# Patient Record
Sex: Male | Born: 1980
Health system: Southern US, Community
[De-identification: ages and names within clinical notes are randomized; demographics above are authoritative.]

## PROBLEM LIST (undated history)

## (undated) DIAGNOSIS — F4325 Adjustment disorder with mixed disturbance of emotions and conduct: Secondary | ICD-10-CM

## (undated) DIAGNOSIS — F603 Borderline personality disorder: Secondary | ICD-10-CM

## (undated) DIAGNOSIS — F79 Unspecified intellectual disabilities: Secondary | ICD-10-CM

## (undated) DIAGNOSIS — F250 Schizoaffective disorder, bipolar type: Secondary | ICD-10-CM

## (undated) DIAGNOSIS — F259 Schizoaffective disorder, unspecified: Secondary | ICD-10-CM

## (undated) DIAGNOSIS — Z9151 Personal history of suicidal behavior: Secondary | ICD-10-CM

## (undated) DIAGNOSIS — Z915 Personal history of self-harm: Secondary | ICD-10-CM

## (undated) HISTORY — PX: INNER EAR SURGERY: SHX679

## (undated) HISTORY — PX: NASAL SINUS SURGERY: SHX719

---

## 2010-10-19 ENCOUNTER — Emergency Department: Payer: Self-pay | Admitting: Emergency Medicine

## 2010-12-04 ENCOUNTER — Inpatient Hospital Stay: Payer: Self-pay | Admitting: Psychiatry

## 2011-01-14 ENCOUNTER — Inpatient Hospital Stay: Payer: Self-pay | Admitting: Psychiatry

## 2011-02-03 ENCOUNTER — Inpatient Hospital Stay: Payer: Self-pay | Admitting: Psychiatry

## 2011-04-15 ENCOUNTER — Emergency Department: Payer: Self-pay | Admitting: Emergency Medicine

## 2011-09-29 LAB — URINALYSIS, COMPLETE
Bacteria: NONE SEEN
Blood: NEGATIVE
Nitrite: NEGATIVE
Protein: NEGATIVE
RBC,UR: NONE SEEN /HPF (ref 0–5)
Specific Gravity: 1.004 (ref 1.003–1.030)
Squamous Epithelial: NONE SEEN
WBC UR: 1 /HPF (ref 0–5)

## 2011-09-29 LAB — COMPREHENSIVE METABOLIC PANEL
Alkaline Phosphatase: 91 U/L (ref 50–136)
Anion Gap: 12 (ref 7–16)
BUN: 4 mg/dL — ABNORMAL LOW (ref 7–18)
Bilirubin,Total: 0.4 mg/dL (ref 0.2–1.0)
Calcium, Total: 9.5 mg/dL (ref 8.5–10.1)
Chloride: 105 mmol/L (ref 98–107)
Co2: 21 mmol/L (ref 21–32)
Creatinine: 0.86 mg/dL (ref 0.60–1.30)
EGFR (Non-African Amer.): >60
Osmolality: 272 (ref 275–301)
Potassium: 3.5 mmol/L (ref 3.5–5.1)
SGOT(AST): 17 U/L (ref 15–37)
SGPT (ALT): 14 U/L
Sodium: 138 mmol/L (ref 136–145)

## 2011-09-29 LAB — TSH: Thyroid Stimulating Horm: 2.13 u[IU]/mL

## 2011-09-29 LAB — DRUG SCREEN, URINE
Barbiturates, Ur Screen: NEGATIVE (ref ?–200)
Cocaine Metabolite,Ur ~~LOC~~: NEGATIVE (ref ?–300)
Methadone, Ur Screen: NEGATIVE (ref ?–300)
Opiate, Ur Screen: NEGATIVE (ref ?–300)
Phencyclidine (PCP) Ur S: NEGATIVE (ref ?–25)

## 2011-09-29 LAB — ETHANOL
Ethanol %: <0.003 % (ref 0.000–0.080)
Ethanol: <3 mg/dL

## 2011-09-29 LAB — CBC
HGB: 15 g/dL (ref 13.0–18.0)
MCH: 33.4 pg (ref 26.0–34.0)
MCHC: 34 g/dL (ref 32.0–36.0)
RBC: 4.5 10*6/uL (ref 4.40–5.90)
RDW: 12.5 % (ref 11.5–14.5)
WBC: 8.4 10*3/uL (ref 3.8–10.6)

## 2011-09-29 LAB — VALPROIC ACID LEVEL: Valproic Acid: 51 ug/mL

## 2011-09-30 ENCOUNTER — Inpatient Hospital Stay: Payer: Self-pay | Admitting: Psychiatry

## 2011-10-01 LAB — LIPID PANEL
HDL Cholesterol: 38 mg/dL — ABNORMAL LOW (ref 40–60)
Ldl Cholesterol, Calc: 60 mg/dL (ref 0–100)
Triglycerides: 108 mg/dL (ref 0–200)

## 2011-10-01 LAB — FOLATE: Folic Acid: 16.1 ng/mL (ref 3.1–100.0)

## 2011-10-03 LAB — VALPROIC ACID LEVEL: Valproic Acid: 102 ug/mL — ABNORMAL HIGH

## 2011-10-04 LAB — VALPROIC ACID LEVEL: Valproic Acid: 73 ug/mL

## 2011-10-07 LAB — COMPREHENSIVE METABOLIC PANEL
Anion Gap: 10 (ref 7–16)
BUN: 8 mg/dL (ref 7–18)
Bilirubin,Total: 0.8 mg/dL (ref 0.2–1.0)
Calcium, Total: 9 mg/dL (ref 8.5–10.1)
Chloride: 95 mmol/L — ABNORMAL LOW (ref 98–107)
Co2: 24 mmol/L (ref 21–32)
Creatinine: 0.78 mg/dL (ref 0.60–1.30)
EGFR (African American): >60
EGFR (Non-African Amer.): >60
Glucose: 73 mg/dL (ref 65–99)
Osmolality: 256 (ref 275–301)
Potassium: 3.6 mmol/L (ref 3.5–5.1)
SGOT(AST): 17 U/L (ref 15–37)
Sodium: 129 mmol/L — ABNORMAL LOW (ref 136–145)
Total Protein: 7.1 g/dL (ref 6.4–8.2)

## 2011-10-07 LAB — URINALYSIS, COMPLETE
Blood: NEGATIVE
Glucose,UR: NEGATIVE mg/dL (ref 0–75)
Nitrite: NEGATIVE
Ph: 5 (ref 4.5–8.0)
Protein: 30

## 2011-10-07 LAB — ACETAMINOPHEN LEVEL: Acetaminophen: <2 ug/mL

## 2011-10-07 LAB — DRUG SCREEN, URINE
Benzodiazepine, Ur Scrn: NEGATIVE (ref ?–200)
Cannabinoid 50 Ng, Ur ~~LOC~~: NEGATIVE (ref ?–50)
Cocaine Metabolite,Ur ~~LOC~~: NEGATIVE (ref ?–300)
MDMA (Ecstasy)Ur Screen: NEGATIVE (ref ?–500)
Methadone, Ur Screen: NEGATIVE (ref ?–300)
Opiate, Ur Screen: NEGATIVE (ref ?–300)
Phencyclidine (PCP) Ur S: NEGATIVE (ref ?–25)
Tricyclic, Ur Screen: NEGATIVE (ref ?–1000)

## 2011-10-07 LAB — CBC
HCT: 41 % (ref 40.0–52.0)
HGB: 13.9 g/dL (ref 13.0–18.0)
MCHC: 33.8 g/dL (ref 32.0–36.0)
MCV: 98 fL (ref 80–100)
Platelet: 196 10*3/uL (ref 150–440)
RDW: 12.7 % (ref 11.5–14.5)

## 2011-10-07 LAB — SALICYLATE LEVEL: Salicylates, Serum: <1.7 mg/dL

## 2011-10-08 ENCOUNTER — Inpatient Hospital Stay: Payer: Self-pay | Admitting: Psychiatry

## 2011-10-11 LAB — BASIC METABOLIC PANEL
Anion Gap: 9 (ref 7–16)
BUN: 11 mg/dL (ref 7–18)
Chloride: 104 mmol/L (ref 98–107)
Co2: 27 mmol/L (ref 21–32)
Creatinine: 0.72 mg/dL (ref 0.60–1.30)
Glucose: 92 mg/dL (ref 65–99)
Osmolality: 278 (ref 275–301)
Sodium: 140 mmol/L (ref 136–145)

## 2011-10-21 LAB — VALPROIC ACID LEVEL: Valproic Acid: 59 ug/mL

## 2013-10-12 ENCOUNTER — Emergency Department (HOSPITAL_COMMUNITY)
Admission: EM | Admit: 2013-10-12 | Discharge: 2013-10-15 | Disposition: A | Discharge status: Home / Self Care | Payer: Medicaid Other | Attending: Emergency Medicine | Admitting: Emergency Medicine

## 2013-10-12 ENCOUNTER — Encounter (HOSPITAL_COMMUNITY): Payer: Self-pay | Admitting: Emergency Medicine

## 2013-10-12 DIAGNOSIS — F4321 Adjustment disorder with depressed mood: Secondary | ICD-10-CM

## 2013-10-12 DIAGNOSIS — Z79899 Other long term (current) drug therapy: Secondary | ICD-10-CM | POA: Insufficient documentation

## 2013-10-12 DIAGNOSIS — Z880 Allergy status to penicillin: Secondary | ICD-10-CM | POA: Insufficient documentation

## 2013-10-12 DIAGNOSIS — F17200 Nicotine dependence, unspecified, uncomplicated: Secondary | ICD-10-CM | POA: Insufficient documentation

## 2013-10-12 DIAGNOSIS — F259 Schizoaffective disorder, unspecified: Secondary | ICD-10-CM | POA: Diagnosis present

## 2013-10-12 DIAGNOSIS — R45851 Suicidal ideations: Secondary | ICD-10-CM | POA: Insufficient documentation

## 2013-10-12 HISTORY — DX: Schizoaffective disorder, unspecified: F25.9

## 2013-10-12 LAB — CBC
HEMATOCRIT: 39.1 % (ref 39.0–52.0)
HEMOGLOBIN: 14.2 g/dL (ref 13.0–17.0)
MCH: 34.5 pg — AB (ref 26.0–34.0)
MCHC: 36.3 g/dL — ABNORMAL HIGH (ref 30.0–36.0)
MCV: 94.9 fL (ref 78.0–100.0)
Platelets: 159 10*3/uL (ref 150–400)
RBC: 4.12 MIL/uL — AB (ref 4.22–5.81)
RDW: 13 % (ref 11.5–15.5)
WBC: 6.2 10*3/uL (ref 4.0–10.5)

## 2013-10-12 LAB — COMPREHENSIVE METABOLIC PANEL
ALT: 28 U/L (ref 0–53)
AST: 32 U/L (ref 0–37)
Albumin: 4 g/dL (ref 3.5–5.2)
Alkaline Phosphatase: 66 U/L (ref 39–117)
BUN: 5 mg/dL — AB (ref 6–23)
CALCIUM: 9.2 mg/dL (ref 8.4–10.5)
CO2: 16 meq/L — AB (ref 19–32)
CREATININE: 0.74 mg/dL (ref 0.50–1.35)
Chloride: 89 mEq/L — ABNORMAL LOW (ref 96–112)
GLUCOSE: 83 mg/dL (ref 70–99)
Potassium: 3.6 mEq/L — ABNORMAL LOW (ref 3.7–5.3)
Sodium: 122 mEq/L — ABNORMAL LOW (ref 137–147)
Total Bilirubin: 0.8 mg/dL (ref 0.3–1.2)
Total Protein: 6.8 g/dL (ref 6.0–8.3)

## 2013-10-12 LAB — RAPID URINE DRUG SCREEN, HOSP PERFORMED
Amphetamines: NOT DETECTED
Barbiturates: NOT DETECTED
Benzodiazepines: NOT DETECTED
Cocaine: NOT DETECTED
Opiates: NOT DETECTED
Tetrahydrocannabinol: NOT DETECTED

## 2013-10-12 LAB — ETHANOL: Alcohol, Ethyl (B): <11 mg/dL (ref 0–11)

## 2013-10-12 LAB — SALICYLATE LEVEL

## 2013-10-12 LAB — ACETAMINOPHEN LEVEL: Acetaminophen (Tylenol), Serum: <15 ug/mL (ref 10–30)

## 2013-10-12 MED ORDER — POTASSIUM CHLORIDE CRYS ER 20 MEQ PO TBCR
40.0000 meq | EXTENDED_RELEASE_TABLET | Freq: Once | ORAL | Status: AC
  Administered 2013-10-12: 40 meq via ORAL
  Filled 2013-10-12: qty 2

## 2013-10-12 MED ORDER — SODIUM CHLORIDE 0.9 % IV BOLUS (SEPSIS)
1000.0000 mL | Freq: Once | INTRAVENOUS | Status: AC
  Administered 2013-10-12: 1000 mL via INTRAVENOUS

## 2013-10-12 NOTE — ED Notes (Signed)
Pt states for the past few days he has been thinking of killing himself  [facility] in room with pt  Pt states he has had a lot going on lately and he has not been taking his medication for the past 4 days  Pt states he wants to drink poison  ACT in room with pt states his medicaid has run out and they cannot find the funding for his medication and in the meantime the pt has decompensated

## 2013-10-12 NOTE — ED Provider Notes (Signed)
CSN: [id number]     Arrival date & time 10/12/13  1946 History  This chart was scribed for Antonietta Breach, [place] working with Dot Lanes, MD by Roxan Diesel, ED Scribe. This patient was seen in room WTR3/WLPT3 and the patient's care was started at 9:25 PM.   Chief Complaint  Patient presents with  . suicidal ideation     The history is provided by the patient. No language interpreter was used.   HPI Comments: Ryan Zuniga is a 33 y.o. male who presents to the Emergency Department complaining of suicidal ideation.  Pt states he has been thinking about killing himself for the past few days because he has had too much "going on, in the group home."  He reports he has been considering drinking poison.  He denies ingesting anything prior to arrival.  However group home staff told [facility] counsellor that pt has not eaten in 2 days.  Pt confirms this and also states he has not been drinking.  He states he is frustrated due to "the way they treat me, non-stop, I can't take it anymore."  He admits to prior h/o suicide attempt in February.  He denies thoughts of hurting anyone else.  He denies recent alcohol or illicit drug use.  He denies auditory or visual hallucinations.  Pt has not taken his medications in 4 days because his Medicaid has lapsed due to switching counties of residence.  The pharmacy initially dropped off his medications at the group home but came back and retrieved them after discovering this lapse.  His group home has been trying to put together money to buy his medications but they are expensive and they have not yet gathered enough funds.   Past Medical History  Diagnosis Date  . Schizoaffective disorder     Past Surgical History  Procedure Laterality Date  . Nasal sinus surgery    . Inner ear surgery      Family History  Problem Relation Age of Onset  . Family history unknown: Yes    History  Substance Use Topics  . Smoking status: Current Every Day  Smoker  . Smokeless tobacco: Not on file  . Alcohol Use: No     Comment: former    Review of Systems  Psychiatric/Behavioral: Positive for suicidal ideas. Negative for hallucinations and self-injury.  All other systems reviewed and are negative.    Allergies  Aspirin; Penicillins; and Codeine  Home Medications   Prior to Admission medications   Medication Sig Start Date End Date Taking? Authorizing Provider  benztropine (COGENTIN) 1 MG tablet Take 1 mg by mouth 2 (two) times daily.   Yes Historical Provider, MD  divalproex (DEPAKOTE) 500 MG DR tablet Take 500 mg by mouth 3 (three) times daily.   Yes Historical Provider, MD  hydrOXYzine (ATARAX/VISTARIL) 25 MG tablet Take 50 mg by mouth 3 (three) times daily as needed (anxiety). Take 2 tablets   Yes Historical Provider, MD  mirtazapine (REMERON) 30 MG tablet Take 30 mg by mouth at bedtime.   Yes Historical Provider, MD  QUEtiapine Fumarate (SEROQUEL XR) 150 MG 24 hr tablet Take 150 mg by mouth at bedtime.   Yes Historical Provider, MD  risperidone (RISPERDAL) 4 MG tablet Take 2-4 mg by mouth 2 (two) times daily. Take 2 mg in the am and 1 in the evening   Yes Historical Provider, MD   BP 118/71  Pulse 73  Temp(Src) 98.2 F (36.8 C) (Oral)  Resp 18  Wt 170 lb (77.111 kg)  SpO2 98%  Physical Exam  Nursing note and vitals reviewed. Constitutional: He is oriented to person, place, and time. He appears well-developed and well-nourished. No distress.  HENT:  Head: Normocephalic and atraumatic.  Eyes: Conjunctivae and EOM are normal. No scleral icterus.  Neck: Normal range of motion.  Cardiovascular: Normal rate, regular rhythm and normal heart sounds.   Pulmonary/Chest: Effort normal and breath sounds normal. No respiratory distress. He has no wheezes. He has no rales.  Abdominal: Soft. He exhibits no distension. There is no tenderness. There is no rebound.  Musculoskeletal: Normal range of motion.  Neurological: He is alert and  oriented to person, place, and time.  Skin: Skin is warm and dry. No rash noted. He is not diaphoretic. No erythema. No pallor.  Psychiatric: He has a normal mood and affect. His behavior is normal. He expresses suicidal ideation. He expresses no homicidal ideation. He expresses suicidal plans. He expresses no homicidal plans.    ED Course  Procedures (including critical care time)  DIAGNOSTIC STUDIES: Oxygen Saturation is 98% on room air, normal by my interpretation.    COORDINATION OF CARE: 9:31 PM-Discussed treatment plan which includes IV fluids and behavioral health evaluation with pt at bedside and pt agreed to plan.   Labs Review Labs Reviewed  CBC - Abnormal; Notable for the following:    RBC 4.12 (*)    MCH 34.5 (*)    MCHC 36.3 (*)    All other components within normal limits  COMPREHENSIVE METABOLIC PANEL - Abnormal; Notable for the following:    Sodium 122 (*)    Potassium 3.6 (*)    Chloride 89 (*)    CO2 16 (*)    BUN 5 (*)    All other components within normal limits  SALICYLATE LEVEL - Abnormal; Notable for the following:    Salicylate Lvl <8.4 (*)    All other components within normal limits  VALPROIC ACID LEVEL - Abnormal; Notable for the following:    Valproic Acid Lvl 133.1 (*)    All other components within normal limits  I-STAT CHEM 8, ED - Abnormal; Notable for the following:    Sodium 131 (*)    Potassium 3.6 (*)    BUN <3 (*)    Glucose, Bld 115 (*)    All other components within normal limits  ACETAMINOPHEN LEVEL  ETHANOL  URINE RAPID DRUG SCREEN (HOSP PERFORMED)  I-STAT CHEM 8, ED   Imaging Review No results found.   EKG Interpretation None      MDM   Final diagnoses:  Suicidal ideations    33 year old male with a history of schizoaffective disorder and past suicide attempts presents today for suicidal ideation. Patient states he has a plan to drink poison to kill himself. Patient denies homicidal ideations, alcohol use, illicit  drug use. Patient evaluated by TTS who has seemed patient appropriate for inpatient treatment. Patient awaiting placement. Care to be assumed by Dalia Heading, PA-C at shift change who will f/u on disposition.   Filed Vitals:   10/12/13 2001 10/13/13 0302 10/13/13 0604  BP: 118/71 113/69 107/61  Pulse: 73 61 61  Temp: 98.2 F (36.8 C) 97.5 F (36.4 C) 98.3 F (36.8 C)  TempSrc: Oral Oral Oral  Resp: 18 16 16   Weight: 170 lb (77.111 kg)    SpO2: 98% 95% 99%     Antonietta Breach, PA-C 10/13/13 [phone]

## 2013-10-13 ENCOUNTER — Encounter (HOSPITAL_COMMUNITY): Payer: Self-pay | Admitting: Psychiatry

## 2013-10-13 DIAGNOSIS — F4321 Adjustment disorder with depressed mood: Secondary | ICD-10-CM | POA: Diagnosis present

## 2013-10-13 LAB — I-STAT CHEM 8, ED
BUN: 4 mg/dL — ABNORMAL LOW (ref 6–23)
CHLORIDE: 102 meq/L (ref 96–112)
CREATININE: 0.7 mg/dL (ref 0.50–1.35)
Calcium, Ion: 1.21 mmol/L (ref 1.12–1.23)
Calcium, Ion: 1.25 mmol/L — ABNORMAL HIGH (ref 1.12–1.23)
Chloride: 96 mEq/L (ref 96–112)
Creatinine, Ser: 0.7 mg/dL (ref 0.50–1.35)
GLUCOSE: 78 mg/dL (ref 70–99)
Glucose, Bld: 115 mg/dL — ABNORMAL HIGH (ref 70–99)
HCT: 43 % (ref 39.0–52.0)
HEMATOCRIT: 45 % (ref 39.0–52.0)
HEMOGLOBIN: 14.6 g/dL (ref 13.0–17.0)
Hemoglobin: 15.3 g/dL (ref 13.0–17.0)
POTASSIUM: 3.6 meq/L — AB (ref 3.7–5.3)
POTASSIUM: 4 meq/L (ref 3.7–5.3)
SODIUM: 131 meq/L — AB (ref 137–147)
SODIUM: 136 meq/L — AB (ref 137–147)
TCO2: 18 mmol/L (ref 0–100)
TCO2: 18 mmol/L (ref 0–100)

## 2013-10-13 LAB — VALPROIC ACID LEVEL: VALPROIC ACID LVL: 133.1 ug/mL — AB (ref 50.0–100.0)

## 2013-10-13 NOTE — ED Notes (Signed)
He quietly eats his breakfast without difficulty and is in no distress.  He has no requests at this time.  Sitter remains at bedside con't.

## 2013-10-13 NOTE — ED Notes (Signed)
I have just given report to Ryan Zuniga in psych. E.D. And he is transferred there at this time without incident.  He ambulates without difficulty.

## 2013-10-13 NOTE — BH Assessment (Signed)
Left voicemail for guardian [name] [phone] requesting collateral information regarding current concerns, dx, and hx of SI.   Lear Ng, [facility] Triage Specialist 10/13/2013 1:54 AM
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
## 2013-10-13 NOTE — ED Notes (Addendum)
Guardian [name] left business card will cell phone number on it and would like to called with updates.   Phone Number is: [phone] Business: [phone]

## 2013-10-13 NOTE — BH Assessment (Signed)
Contacted Ryan Zuniga from [facility] [phone] to obtain collateral information. Asked about current concerns, dx hx, and about any developmental disabilities. Ryan Zuniga reports Ryan Zuniga has not been eating the past 2 1/2 days and does not want to be in the group home. She reports Pt does not like her smoking rules, and reports he does not have to listen to her. Ryan Zuniga reports she [name] gets upset about his speech impediment but was unable to provide this Probation officer with Dx. Ryan Zuniga confirmed Pt has been without medication for the past 4 days due to medicaid lapse.   Spoke with Ryan Colonel, NP about results of the TA. She will check his chart to see if he arrived to ED with any additional information that will assist in placement.   Lear Ng, [facility] Triage Specialist 10/13/2013 2:04 AM

## 2013-10-13 NOTE — BH Assessment (Signed)
Serena Colonel, NP reviewed Pt's chart no MAR. [facility] brought Pt but not assessment was left. Guardian [name] was to come to [facility] to provide more information. This Probation officer attempted to call [name], guardian and left voicemail requesting a call back with information so the Pt could be best served.   Lear Ng, [facility] Triage Specialist 10/13/2013 2:15 AM

## 2013-10-13 NOTE — Progress Notes (Signed)
Spoke with Statistician, to question if IQ paperwork was brought in today along with the guardianship papers and they were not. IQ Paperwork is needed for Disposition placement.  [place] Disposition Tech

## 2013-10-13 NOTE — BH Assessment (Signed)
Ryan Zuniga from adult protective will contact group home to attempt to get needed information. She reports [name] guardian case worker was with Pt until 50 or 11 last night.   Lear Ng, [facility] Triage Specialist 10/13/2013 6:58 AM

## 2013-10-13 NOTE — Progress Notes (Signed)
B.Eliav Mechling, MHT received report from [name], DDS Guardian that she would be bringing copy of guardianship papers and IQ score information to [facility]. Ms. Ishmael Holter reports patient does have diagnosis of MR, Schizoaffective Disorder, and Personality Disorder.

## 2013-10-13 NOTE — BH Assessment (Signed)
Called group home to see if this writer could get information regarding ACT team contact information. Ryan Zuniga reports Pt does not have an ACT team provider as he is idyl, but that he was seen today by the supervisor Colletta Maryland. [phone].  Called the number for ACT provided by [facility]. Message reports unable to leave a voicemail due to the voicemail not yet being set up.   Lear Ng, [facility] Triage Specialist 10/13/2013 4:05 AM

## 2013-10-13 NOTE — BH Assessment (Signed)
Called DSS after hours number. [phone]) Mikki Santee with the after hours service will contact Ross Marcus, the on-call DSS worker to call this writer back.   Lear Ng, [facility] Triage Specialist 10/13/2013 5:08 AM

## 2013-10-13 NOTE — BH Assessment (Signed)
Spoke with Ross Marcus from [place]. She will attempt to gather necessary information regarding IDD, dx, and prior tx hx, and then call TTS back.  Lear Ng, [facility] Triage Specialist 10/13/2013 5:18 AM

## 2013-10-13 NOTE — Progress Notes (Signed)
Patients' group home contacted on this day April Ellison Assistant Administrator [phone] she reports patient came to group home 4 months ago from [facility]. She states that patient does not want to be at group home, he is out of all of his prescribed medications due to problems with Medicaid; Zyprexa 500 mg, Risperidol 4 mg, Seroquel XR 150mg , Topomax 100mg , Benapine 1 mg, Mirtazpine 30 mg, Hydroxpine HCL 25 mg. Ms. Loanne Drilling reports that she believes that patient has IDD but not sure and reports that he is diagnosed with Schizophrenia and Anxiety. Patients ACT provider is Programme researcher, [employer]/film[employer] [phone] and [place] [phone]. Attempted to contact both providers no response

## 2013-10-13 NOTE — BH Assessment (Signed)
Adult protective services worker Junius Finner contacted this Probation officer at the request of DSS worker Ross Marcus. Tia reports she does not have access to Pt's information but she will contact her supervisor to attempt to get the information. Tia's contact information is [phone].   Lear Ng, [facility] Triage Specialist 10/13/2013 6:34 AM

## 2013-10-13 NOTE — ED Notes (Signed)
TTS at bedside.Unable to obtain labs at this time.

## 2013-10-13 NOTE — BH Assessment (Addendum)
Called [facility] to attempt to get information about Pt being brought to ED and possible contact information for ACT team members. Danae Chen Pharmacologist for [facility] reports no notes for [facility] for today. He was last seen in 2014 for schizophrenia.   [place] to attempt to access ACT team contact information and dx hx. Left a voicemail, and it was indicated call would be returned within 30 minutes.  Mark from [employer] reports no record of Pt.  Lear Ng, [facility] Triage Specialist 10/13/2013 3:08 AM
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
## 2013-10-13 NOTE — BH Assessment (Addendum)
Attempted to call guardian [name] again with no answer. Attempted to call ACT team with no voicemail available.  Called [name] again at [phone] 10-13-2013 and left a voicemail requesting a call back for information.   Lear Ng, [facility] Triage Specialist 10/13/2013 5:04 AM
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
## 2013-10-13 NOTE — ED Notes (Signed)
He is sleeping soundly in the presence of his sitter.  His skin is normal, warm and ry and he is breathing normally.

## 2013-10-14 DIAGNOSIS — F3130 Bipolar disorder, current episode depressed, mild or moderate severity, unspecified: Secondary | ICD-10-CM

## 2013-10-14 LAB — VALPROIC ACID LEVEL: Valproic Acid Lvl: 49.3 ug/mL — ABNORMAL LOW (ref 50.0–100.0)

## 2013-10-14 MED ORDER — QUETIAPINE FUMARATE ER 50 MG PO TB24
150.0000 mg | ORAL_TABLET | Freq: Every day | ORAL | Status: DC
  Administered 2013-10-14: 150 mg via ORAL
  Filled 2013-10-14 (×2): qty 3

## 2013-10-14 MED ORDER — MIRTAZAPINE 30 MG PO TABS
30.0000 mg | ORAL_TABLET | Freq: Every day | ORAL | Status: DC
  Administered 2013-10-14: 30 mg via ORAL
  Filled 2013-10-14: qty 1

## 2013-10-14 MED ORDER — RISPERIDONE 2 MG PO TABS: 2.0000 mg | ORAL_TABLET | Freq: Two times a day (BID) | ORAL | Status: DC

## 2013-10-14 MED ORDER — RISPERIDONE 2 MG PO TABS
2.0000 mg | ORAL_TABLET | Freq: Two times a day (BID) | ORAL | Status: DC
  Administered 2013-10-14 – 2013-10-15 (×3): 2 mg via ORAL
  Filled 2013-10-14 (×3): qty 1

## 2013-10-14 MED ORDER — HYDROXYZINE HCL 25 MG PO TABS: 50.0000 mg | ORAL_TABLET | Freq: Three times a day (TID) | ORAL | Status: DC | PRN

## 2013-10-14 MED ORDER — DIVALPROEX SODIUM 500 MG PO DR TAB
500.0000 mg | DELAYED_RELEASE_TABLET | Freq: Three times a day (TID) | ORAL | Status: DC
  Administered 2013-10-14 – 2013-10-15 (×4): 500 mg via ORAL
  Filled 2013-10-14 (×4): qty 1

## 2013-10-14 MED ORDER — BENZTROPINE MESYLATE 1 MG PO TABS
1.0000 mg | ORAL_TABLET | Freq: Two times a day (BID) | ORAL | Status: DC
  Administered 2013-10-14 – 2013-10-15 (×3): 1 mg via ORAL
  Filled 2013-10-14 (×3): qty 1

## 2013-10-14 MED ORDER — QUETIAPINE FUMARATE ER 50 MG PO TB24: 150.0000 mg | ORAL_TABLET | Freq: Every day | ORAL | Status: DC

## 2013-10-14 NOTE — BH Assessment (Signed)
Per Clayborne Dana, [facility] at [facility], adult unit is currently at capacity. Contacted the following facilities for placement:   AT CAPACITY:  [place], per [facility], per Wandra Feinstein, Per [facility], Per [facility], per [facility], Per [facility], per [facility], Per [facility], per [facility], per [facility] Shell, per [facility], per Tori Milks, per [facility], per [facility], per [facility], per [place], [facility], [facility] Triage Specialist [phone]
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
## 2013-10-14 NOTE — Progress Notes (Addendum)
CSW placed call to [facility] to see if they had copy in archives of documentation of IDD. Per [place], no documentation of IDD. Per Joycelyn Schmid at [place], no reason to indicate pt has IDD of IQ below 53. Margaret sent over documentation that references low-average IQ. 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  CSW spoke with clinician Davy Pique at [place]. Davy Pique states there is no record of IDD services for pt in [place] system. There is a record of mental health services for patient--in 2014, pt was referred for a Mental Health care coordinator, but never got one.   Rochele Pages,     ED CSW  phone: [phone] 10:20am ______  CSW left message for pt's county guardian, one at 11am, and one at 1:24pm.  Rochele Pages,     ED CSW  phone: [phone] 1:24pm

## 2013-10-14 NOTE — ED Provider Notes (Signed)
Medical screening examination/treatment/procedure(s) were performed by non-physician practitioner and as supervising physician I was immediately available for consultation/collaboration.   Dot Lanes, MD 10/14/13 [phone]

## 2013-10-15 ENCOUNTER — Encounter (HOSPITAL_COMMUNITY): Payer: Self-pay | Admitting: Psychiatry

## 2013-10-15 DIAGNOSIS — R45851 Suicidal ideations: Secondary | ICD-10-CM

## 2013-10-15 DIAGNOSIS — F4321 Adjustment disorder with depressed mood: Secondary | ICD-10-CM

## 2013-10-15 DIAGNOSIS — F259 Schizoaffective disorder, unspecified: Secondary | ICD-10-CM

## 2013-10-15 MED ORDER — BENZTROPINE MESYLATE 1 MG PO TABS: 1.0000 mg | ORAL_TABLET | Freq: Two times a day (BID) | ORAL | Status: DC

## 2013-10-15 MED ORDER — DIVALPROEX SODIUM 500 MG PO DR TAB: 500.0000 mg | DELAYED_RELEASE_TABLET | Freq: Three times a day (TID) | ORAL | Status: DC

## 2013-10-15 MED ORDER — MIRTAZAPINE 30 MG PO TABS: 30.0000 mg | ORAL_TABLET | Freq: Every day | ORAL | Status: DC

## 2013-10-15 MED ORDER — QUETIAPINE FUMARATE ER 150 MG PO TB24: 150.0000 mg | ORAL_TABLET | Freq: Every day | ORAL | Status: DC

## 2013-10-15 MED ORDER — RISPERIDONE 4 MG PO TABS: 2.0000 mg | ORAL_TABLET | Freq: Two times a day (BID) | ORAL | Status: DC

## 2013-10-15 MED ORDER — RISPERIDONE 2 MG PO TABS
2.0000 mg | ORAL_TABLET | Freq: Every day | ORAL | Status: DC
  Filled 2013-10-15 (×2): qty 1

## 2013-10-15 NOTE — Progress Notes (Signed)
CSW reiceved guardianship papers, and placed a copy in patient chart.   CSW spoke with patient guardian [name] at [phone] who shared that she is currently looking for a new group home or placement for patient. Patient guardian feels that patient current group home is not appropriate for patient and patient will continue to have mental health needs if he returns. CSW and Pt guardian discussed patient current disposition from the weekend. Patient guardian suggested [facility] in [place] [phone]). CSW spoke with Hassan Rowan at [facility] who shared that this was an Assisted Living, however she did have openings. CSW to fax pt clinical information to [phone]. Pt guardian also suggested [facility] in [place] however [place] still awaiting to receive contact information from pt guardian.  CSW unable to find contact information online.   Noreene Larsson [phone]  ED CSW 10/15/2013 924am

## 2013-10-15 NOTE — Progress Notes (Signed)
  CARE MANAGEMENT ED NOTE 10/15/2013  Patient:  Ryan Zuniga, Ryan Zuniga   Account Number:  [id number]  Date Initiated:  10/15/2013  Documentation initiated by:  Jackelyn Poling  Subjective/Objective Assessment:   33 yr old self pay [place] pt previously with medicaid that ws not transferered from county to county when he brought to new group home Medicaid to be active October 17 2013 Pt meds have been covered at self pay cost     Subjective/Objective Assessment Detail:   dx adjustment disorder with depressed mood and bipolar depressed  PMH schizoaffective disorder bipolar disorder Guardian DSS Healther Ishmael Holter, IQ <70  [place] able to assist pt with medications to last until pt medicaid is active on or after October 17 2013     Action/Plan:   ED CM reviewed chart and noted needed medications Discussed in ED [facility] unit progeression Will assist with discount coupons and lists of discounted pharmacies plus contact information for [facility] DSS.   Action/Plan Detail:   CM provided coupons for Cogentin, Depakote, Remeron, Seroquel, Risperdal Left in pt d/c package and [place] ED RN updated   Anticipated DC Date:  10/15/2013     Status Recommendation to Physician:   Result of Recommendation:    Other ED Services  Consult [facility] [place]  Other  Outpatient Services - Pt will follow up    Choice offered to / List presented to:            Status of service:  Completed, signed off  ED Comments:   ED Comments Detail:

## 2013-10-15 NOTE — [facility] Suicide Risk Assessment (Signed)
Suicide Risk Assessment  Discharge Assessment     Demographic Factors:  Male, Caucasian and Unemployed  Total Time spent with patient: 20 minutes  Psychiatric Specialty Exam:     Blood pressure 105/66, pulse 54, temperature 98.6 F (37 C), temperature source Oral, resp. rate 16, weight 170 lb (77.111 kg), SpO2 99.00%.There is no height on file to calculate BMI.  General Appearance: Casual  Eye Contact::  Good  Speech:  Normal Rate  Volume:  Normal  Mood:  Euthymic  Affect:  Congruent  Thought Process:  Coherent  Orientation:  Full (Time, Place, and Person)  Thought Content:  WDL  Suicidal Thoughts:  No  Homicidal Thoughts:  No  Memory:  Immediate;   Good Recent;   Good Remote;   Good  Judgement:  Good  Insight:  Lacking  Psychomotor Activity:  Normal  Concentration:  Good  Recall:  Good  Fund of Knowledge:Fair  Language: Good  Akathisia:  No  Handed:  Right  AIMS (if indicated):     Assets:  Financial Resources/Insurance Housing Leisure Time Physical Health Resilience  Sleep:       Musculoskeletal: Strength & Muscle Tone: within normal limits Gait & Station: normal Patient leans: Right   Mental Status Per Nursing Assessment::   On Admission:   Depression  Current Mental Status by Physician: NA  Loss Factors: NA  Historical Factors: NA  Risk Reduction Factors:   Living with another person, especially a relative, Positive social support and Positive therapeutic relationship  Continued Clinical Symptoms:  None  Cognitive Features That Contribute To Risk:  None  Suicide Risk:  Minimal: No identifiable suicidal ideation.  Patients presenting with no risk factors but with morbid ruminations; may be classified as minimal risk based on the severity of the depressive symptoms  Discharge Diagnoses:   AXIS I:  Schizoaffective Disorder AXIS II:  Deferred AXIS III:   Past Medical History  Diagnosis Date  . Schizoaffective disorder    AXIS IV:   other psychosocial or environmental problems and problems with primary support group AXIS V:  61-70 mild symptoms  Plan Of Care/Follow-up recommendations:  Activity:  as tolerated Diet:  low-sodium heart healthy diet  Is patient on multiple antipsychotic therapies at discharge:  No   Has Patient had three or more failed trials of antipsychotic monotherapy by history:  No  Recommended Plan for Multiple Antipsychotic Therapies: NA    LORD, JAMISON, PMH-NP 10/15/2013, 12:35 PM

## 2013-10-15 NOTE — Progress Notes (Addendum)
Per discussion with psychiatrist and NP, patient psychiatrically stable for dc back to group home. CSW spoke with [name] who shares that the biggest ncern is patient medicaid not being valid until 10/17/2013 and the group home not having the medications. Patient to be provided 3 day supply by [facility]. [name] provided group home contact information for [facility], group home owner [phone] and on the group home number [phone]. CSW left message with group home owner on the cell. CSW spoke with group home worker Jiles Garter who stated that owner will have to arrange transportation and unavailable at this time.  CSW informed patient guardian [name] to assist with pt transportation needs. Pt guardian to pick up pt at [place], [place]  ED CSW 10/15/2013 1251pm
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
## 2013-10-15 NOTE — Progress Notes (Signed)
CSW faxed referral to [facility] and [facility] group homes per guardian [name] request.  Tilden Fossa, MSW, [place] Worker [employer] [phone]

## 2013-11-03 ENCOUNTER — Emergency Department: Payer: Self-pay | Admitting: Emergency Medicine

## 2013-11-03 LAB — URINALYSIS, COMPLETE
BACTERIA: NONE SEEN
BILIRUBIN, UR: NEGATIVE
Blood: NEGATIVE
GLUCOSE, UR: NEGATIVE mg/dL (ref 0–75)
LEUKOCYTE ESTERASE: NEGATIVE
Nitrite: NEGATIVE
Ph: 8 (ref 4.5–8.0)
Protein: NEGATIVE
RBC,UR: NONE SEEN /HPF (ref 0–5)
SPECIFIC GRAVITY: 1.004 (ref 1.003–1.030)
Squamous Epithelial: NONE SEEN
WBC UR: 1 /HPF (ref 0–5)

## 2013-11-03 LAB — COMPREHENSIVE METABOLIC PANEL
AST: 17 U/L (ref 15–37)
Albumin: 3.6 g/dL (ref 3.4–5.0)
Alkaline Phosphatase: 59 U/L
Anion Gap: 11 (ref 7–16)
BUN: 3 mg/dL — ABNORMAL LOW (ref 7–18)
Bilirubin,Total: 0.4 mg/dL (ref 0.2–1.0)
Calcium, Total: 8.7 mg/dL (ref 8.5–10.1)
Chloride: 104 mmol/L (ref 98–107)
Co2: 20 mmol/L — ABNORMAL LOW (ref 21–32)
Creatinine: 0.88 mg/dL (ref 0.60–1.30)
EGFR (African American): >60
EGFR (Non-African Amer.): >60
Glucose: 81 mg/dL (ref 65–99)
OSMOLALITY: 266 (ref 275–301)
POTASSIUM: 3.3 mmol/L — AB (ref 3.5–5.1)
SGPT (ALT): 28 U/L (ref 12–78)
SODIUM: 135 mmol/L — AB (ref 136–145)
Total Protein: 6.9 g/dL (ref 6.4–8.2)

## 2013-11-03 LAB — CBC
HCT: 41.5 % (ref 40.0–52.0)
HGB: 14 g/dL (ref 13.0–18.0)
MCH: 34.6 pg — ABNORMAL HIGH (ref 26.0–34.0)
MCHC: 33.7 g/dL (ref 32.0–36.0)
MCV: 103 fL — AB (ref 80–100)
PLATELETS: 184 10*3/uL (ref 150–440)
RBC: 4.04 10*6/uL — AB (ref 4.40–5.90)
RDW: 14 % (ref 11.5–14.5)
WBC: 6.2 10*3/uL (ref 3.8–10.6)

## 2013-11-03 LAB — DRUG SCREEN, URINE

## 2013-11-03 LAB — ETHANOL
Ethanol %: <0.003 % (ref 0.000–0.080)
Ethanol: <3 mg/dL

## 2013-11-03 LAB — ACETAMINOPHEN LEVEL: Acetaminophen: <2 ug/mL

## 2013-11-03 LAB — SALICYLATE LEVEL: SALICYLATES, SERUM: 2.1 mg/dL

## 2013-11-26 ENCOUNTER — Emergency Department: Payer: Self-pay | Admitting: Emergency Medicine

## 2013-11-26 LAB — DRUG SCREEN, URINE
Amphetamines, Ur Screen: NEGATIVE (ref ?–1000)
BARBITURATES, UR SCREEN: NEGATIVE (ref ?–200)
Benzodiazepine, Ur Scrn: NEGATIVE (ref ?–200)
CANNABINOID 50 NG, UR ~~LOC~~: NEGATIVE (ref ?–50)
Cocaine Metabolite,Ur ~~LOC~~: NEGATIVE (ref ?–300)
MDMA (ECSTASY) UR SCREEN: NEGATIVE (ref ?–500)
Methadone, Ur Screen: NEGATIVE (ref ?–300)
OPIATE, UR SCREEN: NEGATIVE (ref ?–300)
Phencyclidine (PCP) Ur S: NEGATIVE (ref ?–25)
TRICYCLIC, UR SCREEN: NEGATIVE (ref ?–1000)

## 2013-11-26 LAB — SALICYLATE LEVEL: SALICYLATES, SERUM: 3.2 mg/dL — AB

## 2013-11-26 LAB — URINALYSIS, COMPLETE
BACTERIA: NONE SEEN
Bilirubin,UR: NEGATIVE
Glucose,UR: NEGATIVE mg/dL (ref 0–75)
Leukocyte Esterase: NEGATIVE
Nitrite: NEGATIVE
Ph: 7 (ref 4.5–8.0)
Protein: NEGATIVE
RBC,UR: 23 /HPF (ref 0–5)
SPECIFIC GRAVITY: 1.011 (ref 1.003–1.030)
Squamous Epithelial: NONE SEEN
WBC UR: 1 /HPF (ref 0–5)

## 2013-11-26 LAB — CBC
HCT: 46.2 % (ref 40.0–52.0)
HGB: 15.4 g/dL (ref 13.0–18.0)
MCH: 35.6 pg — AB (ref 26.0–34.0)
MCHC: 33.4 g/dL (ref 32.0–36.0)
MCV: 107 fL — AB (ref 80–100)
Platelet: 237 10*3/uL (ref 150–440)
RBC: 4.33 10*6/uL — AB (ref 4.40–5.90)
RDW: 13.2 % (ref 11.5–14.5)
WBC: 9.3 10*3/uL (ref 3.8–10.6)

## 2013-11-26 LAB — ACETAMINOPHEN LEVEL

## 2013-11-26 LAB — ETHANOL: Ethanol %: <0.003 % (ref 0.000–0.080)

## 2013-11-26 LAB — VALPROIC ACID LEVEL: VALPROIC ACID: 111 ug/mL — AB

## 2013-11-26 LAB — COMPREHENSIVE METABOLIC PANEL
ALK PHOS: 68 U/L
ALT: 27 U/L
AST: 22 U/L (ref 15–37)
Albumin: 3.9 g/dL (ref 3.4–5.0)
Anion Gap: 13 (ref 7–16)
BILIRUBIN TOTAL: 0.4 mg/dL (ref 0.2–1.0)
BUN: 5 mg/dL — AB (ref 7–18)
CO2: 23 mmol/L (ref 21–32)
Calcium, Total: 8.9 mg/dL (ref 8.5–10.1)
Chloride: 100 mmol/L (ref 98–107)
Creatinine: 0.9 mg/dL (ref 0.60–1.30)
EGFR (African American): >60
EGFR (Non-African Amer.): >60
Glucose: 89 mg/dL (ref 65–99)
Osmolality: 269 (ref 275–301)
Potassium: 3.1 mmol/L — ABNORMAL LOW (ref 3.5–5.1)
Sodium: 136 mmol/L (ref 136–145)
Total Protein: 7.7 g/dL (ref 6.4–8.2)

## 2014-05-01 ENCOUNTER — Emergency Department (HOSPITAL_COMMUNITY)
Admission: EM | Admit: 2014-05-01 | Discharge: 2014-05-03 | Disposition: A | Discharge status: Home / Self Care | Payer: Medicaid Other | Attending: Emergency Medicine | Admitting: Emergency Medicine

## 2014-05-01 ENCOUNTER — Encounter (HOSPITAL_COMMUNITY): Payer: Self-pay | Admitting: Emergency Medicine

## 2014-05-01 DIAGNOSIS — R000 Tachycardia, unspecified: Secondary | ICD-10-CM | POA: Insufficient documentation

## 2014-05-01 DIAGNOSIS — Z720 Tobacco use: Secondary | ICD-10-CM | POA: Insufficient documentation

## 2014-05-01 DIAGNOSIS — Z046 Encounter for general psychiatric examination, requested by authority: Secondary | ICD-10-CM | POA: Diagnosis present

## 2014-05-01 DIAGNOSIS — Z79899 Other long term (current) drug therapy: Secondary | ICD-10-CM | POA: Diagnosis not present

## 2014-05-01 DIAGNOSIS — Z880 Allergy status to penicillin: Secondary | ICD-10-CM | POA: Diagnosis not present

## 2014-05-01 DIAGNOSIS — F259 Schizoaffective disorder, unspecified: Secondary | ICD-10-CM | POA: Diagnosis not present

## 2014-05-01 LAB — CBC WITH DIFFERENTIAL/PLATELET
Basophils Absolute: 0 10*3/uL (ref 0.0–0.1)
Basophils Relative: 0 % (ref 0–1)
EOS ABS: 0.1 10*3/uL (ref 0.0–0.7)
EOS PCT: 3 % (ref 0–5)
HCT: 44.3 % (ref 39.0–52.0)
Hemoglobin: 14.8 g/dL (ref 13.0–17.0)
Lymphocytes Relative: 44 % (ref 12–46)
Lymphs Abs: 2 10*3/uL (ref 0.7–4.0)
MCH: 34.3 pg — AB (ref 26.0–34.0)
MCHC: 33.4 g/dL (ref 30.0–36.0)
MCV: 102.8 fL — AB (ref 78.0–100.0)
Monocytes Absolute: 0.5 10*3/uL (ref 0.1–1.0)
Monocytes Relative: 10 % (ref 3–12)
Neutro Abs: 2 10*3/uL (ref 1.7–7.7)
Neutrophils Relative %: 43 % (ref 43–77)
Platelets: 231 10*3/uL (ref 150–400)
RBC: 4.31 MIL/uL (ref 4.22–5.81)
RDW: 12.9 % (ref 11.5–15.5)
WBC: 4.6 10*3/uL (ref 4.0–10.5)

## 2014-05-01 LAB — BASIC METABOLIC PANEL
Anion gap: 8 (ref 5–15)
BUN: 9 mg/dL (ref 6–23)
CO2: 22 mmol/L (ref 19–32)
Calcium: 9.4 mg/dL (ref 8.4–10.5)
Chloride: 108 mEq/L (ref 96–112)
Creatinine, Ser: 0.87 mg/dL (ref 0.50–1.35)
GFR calc non Af Amer: >90 mL/min (ref >90)
GLUCOSE: 91 mg/dL (ref 70–99)
POTASSIUM: 3.8 mmol/L (ref 3.5–5.1)
SODIUM: 138 mmol/L (ref 135–145)

## 2014-05-01 LAB — ETHANOL

## 2014-05-01 LAB — VALPROIC ACID LEVEL: Valproic Acid Lvl: 97.3 ug/mL (ref 50.0–100.0)

## 2014-05-01 MED ORDER — MIRTAZAPINE 30 MG PO TABS
30.0000 mg | ORAL_TABLET | Freq: Every day | ORAL | Status: DC
  Administered 2014-05-01 – 2014-05-02 (×2): 30 mg via ORAL
  Filled 2014-05-01 (×2): qty 1

## 2014-05-01 MED ORDER — ACETAMINOPHEN 325 MG PO TABS: 650.0000 mg | ORAL_TABLET | ORAL | Status: DC | PRN

## 2014-05-01 MED ORDER — BENZTROPINE MESYLATE 1 MG PO TABS
1.0000 mg | ORAL_TABLET | Freq: Two times a day (BID) | ORAL | Status: DC
  Administered 2014-05-01 – 2014-05-03 (×4): 1 mg via ORAL
  Filled 2014-05-01 (×4): qty 1

## 2014-05-01 MED ORDER — NICOTINE 21 MG/24HR TD PT24
21.0000 mg | MEDICATED_PATCH | Freq: Every day | TRANSDERMAL | Status: DC
  Administered 2014-05-02 – 2014-05-03 (×2): 21 mg via TRANSDERMAL
  Filled 2014-05-01 (×2): qty 1

## 2014-05-01 MED ORDER — HYDROXYZINE HCL 25 MG PO TABS: 50.0000 mg | ORAL_TABLET | Freq: Three times a day (TID) | ORAL | Status: DC | PRN

## 2014-05-01 MED ORDER — TOPIRAMATE 25 MG PO TABS
100.0000 mg | ORAL_TABLET | Freq: Three times a day (TID) | ORAL | Status: DC
  Administered 2014-05-01 – 2014-05-03 (×5): 100 mg via ORAL
  Filled 2014-05-01 (×5): qty 4

## 2014-05-01 MED ORDER — QUETIAPINE FUMARATE ER 300 MG PO TB24
300.0000 mg | ORAL_TABLET | Freq: Every day | ORAL | Status: DC
  Filled 2014-05-01 (×2): qty 1

## 2014-05-01 MED ORDER — DIVALPROEX SODIUM 500 MG PO DR TAB
500.0000 mg | DELAYED_RELEASE_TABLET | Freq: Three times a day (TID) | ORAL | Status: DC
  Administered 2014-05-01 – 2014-05-03 (×5): 500 mg via ORAL
  Filled 2014-05-01 (×5): qty 1

## 2014-05-01 NOTE — Progress Notes (Signed)
Per report received from [place], pt did not have no scheduled medications ordered to be given prior to him coming to [facility]. However, pt had scheduled medications (Cogentin, Depakote, Nicoderm patch, Seroquel & Topamax)  which were not administered while in [place] and was overdue at 1545 per [facility]. [facility] NP Theodoro Clock made aware of event and also that the time for the next scheduled doses was too close for writer to administer then. Will inform oncoming nurse of event via shift report. Will continue to monitor pt as per order to promote safety.
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
## 2014-05-01 NOTE — Progress Notes (Signed)
[facility] received phone call from [facility] , patient's guardian at this time reporting she is not coming.  Nira Conn reports patient is now staying at [facility] supervised Living in [place].  Nira Conn reports this is the patient's fourth placement.  Patient was at [facility] in [place] prior to [facility].  Nira Conn reports patient has an ACT team with [facility].  Nira Conn reports she was able to change over patient's Mediciaid and patient is able to receive his medications, he is just not taking them.  Nira Conn reports EDSW may call her back on her cell phone [phone].  No further EDCM needs at this time.
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
## 2014-05-01 NOTE — Progress Notes (Signed)
Received call back from patient's guardian [name].  Per Ms Ishmael Holter, she is on her way to [place] to see another client and will answer [facility] and EDSW questions when she arrives.

## 2014-05-01 NOTE — BH Assessment (Signed)
Notified [place], TTS of patient consult request.   Shaune Pollack, MS, [place] Assessment Counselor
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
## 2014-05-01 NOTE — Progress Notes (Signed)
  CARE MANAGEMENT ED NOTE 05/01/2014  Patient:  Ryan Zuniga, Ryan Zuniga   Account Number:  [id number]  Date Initiated:  05/01/2014  Documentation initiated by:  Livia Snellen  Subjective/Objective Assessment:   Patient presents to Ed form group home.  Per group home patient has had combative behavior, cutting up cans to make knives, not taking his medicaitons, peeing on the floor, locking himself in a room with a lighter     Subjective/Objective Assessment Detail:   Patient reports he does not like the way the group home is treating him.  Patient reports the staff at the group home do no want him to come back     Action/Plan:   TTS consult placed   Action/Plan Detail:   Anticipated DC Date:       Status Recommendation to Physician:   Result of Recommendation:    Other ED Services  Consult Working Plan   In-house referral  [place]  Other  PCP issues    Choice offered to / List presented to:            Status of service:  Completed, signed off  ED Comments:   ED Comments Detail:  Per chart review last known group home patient to be discharged to was [facility].  Also, patient noted to have a guardian named Ryan Zuniga [phone].  Per chart review, patient listed as having Medicaid insurnace without pcp.  Last admission in 09/2013 noted patient to have Medicaid insurance that has not crossed over to county of patient's group home.  Also noted per chart review patient active with ACT team.  [facility] spoke to patient at bedside.  Patient unable to tell [facility] which group home he is from.  Patient confirms his guardian is Ryan Zuniga and that he spoke to her last Friday.  Patient is unable to tell [facility] current status of Mediciad.  Patient reports he has his medications, he just hasn't been taking them.  EDCM questioned patient as to why he hasn't been taking his medications?  Patient responded, "It's because of the way they have been treating  me at the group home."  [facility] strongly encouraged patient to take his medications.  EDCM called and left message for Ryan Zuniga for collateral information at 1700pm.  Awaiting call back.  Per chart review patient with low IQ.  [facility] consulted EDSW at 1621pm. No further EDCM needs at this time.
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
## 2014-05-01 NOTE — ED Provider Notes (Signed)
CSN: [id number]     Arrival date & time 05/01/14  1006 History   First MD Initiated Contact with Patient 05/01/14 1039     Chief Complaint  Patient presents with  . Medical Clearance    Level V caveat due to psychiatric disorder.s (Consider location/radiation/quality/duration/timing/severity/associated sxs/prior Treatment) The history is provided by the patient.   patient was brought in by group home for medical clearance. He has a history of schizoaffective disorder. He is reportedly been off his medications. He has been somewhat violent at the group home. He is reportedly been cutting up cans trying to make knives. He states he has not been taking his medicine. States he does not like the group home. Caregiver at the nursing home states he is doing IVC papers on him.  Past Medical History  Diagnosis Date  . Schizoaffective disorder    Past Surgical History  Procedure Laterality Date  . Nasal sinus surgery    . Inner ear surgery     No family history on file. History  Substance Use Topics  . Smoking status: Current Every Day Smoker  . Smokeless tobacco: Not on file  . Alcohol Use: No     Comment: former    Review of Systems  Unable to perform ROS: Psychiatric disorder      Allergies  Aspirin; Penicillins; and Codeine  Home Medications   Prior to Admission medications   Medication Sig Start Date End Date Taking? Authorizing Provider  benztropine (COGENTIN) 1 MG tablet Take 1 tablet (1 mg total) by mouth 2 (two) times daily. 10/15/13  Yes Waylan Boga, NP  divalproex (DEPAKOTE) 500 MG DR tablet Take 1 tablet (500 mg total) by mouth 3 (three) times daily. 10/15/13  Yes Waylan Boga, NP  mirtazapine (REMERON) 30 MG tablet Take 1 tablet (30 mg total) by mouth at bedtime. 10/15/13  Yes Waylan Boga, NP  QUEtiapine (SEROQUEL XR) 300 MG 24 hr tablet Take 300 mg by mouth daily.   Yes Historical Provider, MD  topiramate (TOPAMAX) 100 MG tablet Take 100 mg by mouth 3 (three) times  daily.   Yes Historical Provider, MD  hydrOXYzine (ATARAX/VISTARIL) 25 MG tablet Take 50 mg by mouth 3 (three) times daily as needed (anxiety). Take 2 tablets    Historical Provider, MD  QUEtiapine Fumarate (SEROQUEL XR) 150 MG 24 hr tablet Take 1 tablet (150 mg total) by mouth at bedtime. Patient not taking: Reported on 05/01/2014 10/15/13   Waylan Boga, NP  risperidone (RISPERDAL) 4 MG tablet Take 0.5-1 tablets (2-4 mg total) by mouth 2 (two) times daily. Take 2 mg in the am and 1 in the evening Patient not taking: Reported on 05/01/2014 10/15/13   Waylan Boga, NP   BP 132/77 mmHg  Pulse 106  Temp(Src) 98.9 F (37.2 C) (Oral)  SpO2 95% Physical Exam  Constitutional: He appears well-developed and well-nourished.  HENT:  Head: Normocephalic.  Neck: Neck supple.  Cardiovascular:  Mild tachycardia  Pulmonary/Chest: Effort normal and breath sounds normal.  Abdominal: There is no tenderness.  Neurological: He is alert.  Patient somewhat uncooperative. Poor historian. He answers most questions appropriately though.  Skin: Skin is warm.  Psychiatric:  Somewhat flat affect and appears to avoid some eye contact.    ED Course  Procedures (including critical care time) Labs Review Labs Reviewed  CBC WITH DIFFERENTIAL - Abnormal; Notable for the following:    MCV 102.8 (*)    MCH 34.3 (*)    All other components within  normal limits  ETHANOL  BASIC METABOLIC PANEL  VALPROIC ACID LEVEL  URINE RAPID DRUG SCREEN (HOSP PERFORMED)    Imaging Review No results found.   EKG Interpretation None      MDM   Final diagnoses:  Schizoaffective disorder, unspecified type    Patient with increasing violence and uncooperativeness at group home. Reportedly to be under IVC papers from them although they were not done yet. His been noncompliant with medications. Appears to medically cleared and will be seen by TTS.    Jasper Riling. Alvino Chapel, MD 05/01/14 [phone]

## 2014-05-02 DIAGNOSIS — F259 Schizoaffective disorder, unspecified: Secondary | ICD-10-CM | POA: Insufficient documentation

## 2014-05-02 DIAGNOSIS — F250 Schizoaffective disorder, bipolar type: Secondary | ICD-10-CM

## 2014-05-02 MED ORDER — QUETIAPINE FUMARATE ER 300 MG PO TB24
300.0000 mg | ORAL_TABLET | Freq: Every day | ORAL | Status: DC
  Administered 2014-05-02: 300 mg via ORAL
  Filled 2014-05-02 (×2): qty 1

## 2014-05-02 NOTE — ED Notes (Signed)
Pt normally takes seroquel at night-did not receive yesterday and is scheduled for AM-hold for now [place] NP

## 2014-05-02 NOTE — ED Notes (Signed)
Up to the bathroom 

## 2014-05-02 NOTE — ED Notes (Signed)
Talking w/ DSS worker

## 2014-05-02 NOTE — ED Notes (Signed)
Dr Loni Muse and Josephone NP in to see.  Pt's DSS case worker is also here and into see

## 2014-05-02 NOTE — Progress Notes (Signed)
CSW was notified by first shift CSW coverage that she filed a complaint with the department of health services regulation in their complain unit regarding the patients group home .   2nd shift CSW will call and verify that they received the complaint.  Complaint line [phone]  Willette Brace [phone] ED CSW 05/02/2014 5:08 PM

## 2014-05-02 NOTE — Progress Notes (Signed)
CSW reached out to [facility], who is the patient's guardian to discuss the status of patients group home. However, she did not answer the phone. CSW left voicemail and text message asking to return call.   Per day shift CSW cover, Nira Conn stated that she is currently working on a new placement for patient but understands if the patient does not have a new group home by discharge the patient will return back to initial group ome.   CSW will reach back out to [employer].  Willette Brace [phone] ED CSW 05/02/2014 4:50 PM

## 2014-05-02 NOTE — Progress Notes (Signed)
CSW received text message from Heather/Guardian who states she is driving but will call back once she gets a chance.  Willette Brace [phone] ED CSW 05/02/2014 5:04 PM

## 2014-05-02 NOTE — Clinical Social Work Note (Signed)
CSW has left a message this morning for Ryan Zuniga, the supervisor of [facility] (group home) where patient was residing- await call back. CSW has also contacted DSS/Guardian, Ryan Zuniga, [phone], this morning and await callback. CSW will update once contact is made- per rounds, patient will be observed over night and ready for dc on tomorrow-  Eduard Clos, MSW, [place]
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
## 2014-05-02 NOTE — Clinical Social Work Note (Signed)
CSW spoke with [name], DSS/Guardian [phone], who indicates patient is from a group home where he has 1:1 care.  She reports that she is working on an alternate setting for him. CSW advised her that we anticipate dc tomorrow and that CSW has spoken with Kirk Ruths at the group home who was also advised of probable dc tomorrow- they both understand if an alternate setting is not secured by tomorrow he will need to return to [facility] and seek transfer once they are able to finalize. Both DSS/Guardiand and group home director understand and agree.     Eduard Clos, MSW, [place]
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
## 2014-05-03 NOTE — Progress Notes (Signed)
Per discussion with psychiatrist and NP, patient remains psychiatrically stable for dc back to group home. Per notes, patient guardian, Ryan Zuniga would like to be the one to inform patient of his return to the group home. Pt is requesting a new group home and pt guardian is working on a new placement. Pt has a good rapport with guardian, and should receive this information more reassuring from guardian than from hospital staff.  CSW left message with group home, Ryan Zuniga [phone] and with guardian Ryan Zuniga [phone].   Ryan Zuniga [phone]  ED CSW 05/03/2014 10:11 AM

## 2014-05-03 NOTE — ED Notes (Signed)
Patient pleasant on approach this am. Reports his mood is good and denies any SI. No bizarre behaviors or agitation noted. Physician and NP to see him. They report that he will be able to go back to group home. They explained to him that if he continues to not like it there his social worker will have to look for him another place to go. Patient nodded head. Took all medications without issue.

## 2014-05-03 NOTE — [facility] Suicide Risk Assessment (Cosign Needed)
Suicide Risk Assessment  Discharge Assessment     Demographic Factors:  Male, Caucasian and Low socioeconomic status  Total Time spent with patient: 30 minutes  Psychiatric Specialty Exam:     Blood pressure 109/66, pulse 76, temperature 98.2 F (36.8 C), temperature source Oral, resp. rate 16, SpO2 99 %.There is no height or weight on file to calculate BMI.  General Appearance: Casual and Fairly Groomed  Engineer, water::  Fair  Speech:  Clear and Coherent and Normal Rate  Volume:  Normal  Mood:  Depressed  Affect:  Congruent and Depressed  Thought Process:  Coherent and Goal Directed  Orientation:  Full (Time, Place, and Person)  Thought Content:  WDL  Suicidal Thoughts:  No  Homicidal Thoughts:  No  Memory:  Immediate;   Good Recent;   Fair Remote;   Fair  Judgement:  Fair  Insight:  Fair  Psychomotor Activity:  Normal  Concentration:  Good  Recall:  NA  Fund of Knowledge:Fair  Language: Good  Akathisia:  NA  Handed:  Right  AIMS (if indicated):     Assets:  Desire for Improvement  Sleep:       Musculoskeletal: Strength & Muscle Tone: within normal limits Gait & Station: normal Patient leans: N/A   Mental Status Per Nursing Assessment::   On Admission:     Current Mental Status by Physician: NA  Loss Factors: NA  Historical Factors: NA  Risk Reduction Factors:   Living with another person, especially a relative  Continued Clinical Symptoms:  Bipolar Disorder:   Depressive phase Depression:   Impulsivity  Cognitive Features That Contribute To Risk:  Polarized thinking    Suicide Risk:  Minimal: No identifiable suicidal ideation.  Patients presenting with no risk factors but with morbid ruminations; may be classified as minimal risk based on the severity of the depressive symptoms  Discharge Diagnoses:   AXIS I:  Bipolar, Depressed AXIS II:  Mental retardation, severity unknown AXIS III:   Past Medical History  Diagnosis Date  .  Schizoaffective disorder    AXIS IV:  other psychosocial or environmental problems and problems related to social environment AXIS V:  61-70 mild symptoms  Plan Of Care/Follow-up recommendations:  Activity:  As tolerated Diet:  regular  Is patient on multiple antipsychotic therapies at discharge:  No   Has Patient had three or more failed trials of antipsychotic monotherapy by history:  No  Recommended Plan for Multiple Antipsychotic Therapies: NA    Charmaine Downs, C    PMHNP-BC 05/03/2014, 1:26 PM

## 2014-05-03 NOTE — ED Notes (Signed)
Patient sent back to group home at this time. Patient calm with no agitation or bizarre behavior at discharge.

## 2014-05-09 ENCOUNTER — Emergency Department (HOSPITAL_COMMUNITY)
Admission: EM | Admit: 2014-05-09 | Discharge: 2014-05-15 | Disposition: A | Discharge status: Home / Self Care | Payer: Medicaid Other | Attending: Emergency Medicine | Admitting: Emergency Medicine

## 2014-05-09 ENCOUNTER — Encounter (HOSPITAL_COMMUNITY): Payer: Self-pay | Admitting: Emergency Medicine

## 2014-05-09 DIAGNOSIS — F259 Schizoaffective disorder, unspecified: Secondary | ICD-10-CM | POA: Diagnosis present

## 2014-05-09 DIAGNOSIS — R45851 Suicidal ideations: Secondary | ICD-10-CM | POA: Insufficient documentation

## 2014-05-09 DIAGNOSIS — Z880 Allergy status to penicillin: Secondary | ICD-10-CM | POA: Diagnosis not present

## 2014-05-09 DIAGNOSIS — Z046 Encounter for general psychiatric examination, requested by authority: Secondary | ICD-10-CM | POA: Diagnosis present

## 2014-05-09 DIAGNOSIS — Z720 Tobacco use: Secondary | ICD-10-CM | POA: Insufficient documentation

## 2014-05-09 DIAGNOSIS — R45850 Homicidal ideations: Secondary | ICD-10-CM

## 2014-05-09 DIAGNOSIS — Z79899 Other long term (current) drug therapy: Secondary | ICD-10-CM | POA: Diagnosis not present

## 2014-05-09 DIAGNOSIS — F439 Reaction to severe stress, unspecified: Secondary | ICD-10-CM | POA: Diagnosis present

## 2014-05-09 DIAGNOSIS — F4321 Adjustment disorder with depressed mood: Secondary | ICD-10-CM

## 2014-05-09 LAB — CBC WITH DIFFERENTIAL/PLATELET
Basophils Absolute: 0 10*3/uL (ref 0.0–0.1)
Basophils Relative: 0 % (ref 0–1)
Eosinophils Absolute: 0.1 10*3/uL (ref 0.0–0.7)
Eosinophils Relative: 2 % (ref 0–5)
HCT: 42.5 % (ref 39.0–52.0)
Hemoglobin: 14.6 g/dL (ref 13.0–17.0)
Lymphocytes Relative: 43 % (ref 12–46)
Lymphs Abs: 2.6 10*3/uL (ref 0.7–4.0)
MCH: 35.1 pg — ABNORMAL HIGH (ref 26.0–34.0)
MCHC: 34.4 g/dL (ref 30.0–36.0)
MCV: 102.2 fL — ABNORMAL HIGH (ref 78.0–100.0)
Monocytes Absolute: 0.9 10*3/uL (ref 0.1–1.0)
Monocytes Relative: 14 % — ABNORMAL HIGH (ref 3–12)
Neutro Abs: 2.5 10*3/uL (ref 1.7–7.7)
Neutrophils Relative %: 41 % — ABNORMAL LOW (ref 43–77)
Platelets: 225 10*3/uL (ref 150–400)
RBC: 4.16 MIL/uL — ABNORMAL LOW (ref 4.22–5.81)
RDW: 12.6 % (ref 11.5–15.5)
WBC: 6.1 10*3/uL (ref 4.0–10.5)

## 2014-05-09 LAB — COMPREHENSIVE METABOLIC PANEL
ALT: 18 U/L (ref 0–53)
AST: 21 U/L (ref 0–37)
Albumin: 4.1 g/dL (ref 3.5–5.2)
Alkaline Phosphatase: 58 U/L (ref 39–117)
Anion gap: 7 (ref 5–15)
BUN: 7 mg/dL (ref 6–23)
CO2: 23 mmol/L (ref 19–32)
Calcium: 9.2 mg/dL (ref 8.4–10.5)
Chloride: 105 mEq/L (ref 96–112)
Creatinine, Ser: 0.81 mg/dL (ref 0.50–1.35)
GFR calc Af Amer: >90 mL/min (ref >90)
GFR calc non Af Amer: >90 mL/min (ref >90)
Glucose, Bld: 80 mg/dL (ref 70–99)
Potassium: 3.6 mmol/L (ref 3.5–5.1)
Sodium: 135 mmol/L (ref 135–145)
Total Bilirubin: 0.2 mg/dL — ABNORMAL LOW (ref 0.3–1.2)
Total Protein: 7.1 g/dL (ref 6.0–8.3)

## 2014-05-09 LAB — RAPID URINE DRUG SCREEN, HOSP PERFORMED
Amphetamines: NOT DETECTED
Barbiturates: NOT DETECTED
Benzodiazepines: NOT DETECTED
Cocaine: NOT DETECTED
Opiates: NOT DETECTED
Tetrahydrocannabinol: NOT DETECTED

## 2014-05-09 LAB — VALPROIC ACID LEVEL: Valproic Acid Lvl: 100.6 ug/mL — ABNORMAL HIGH (ref 50.0–100.0)

## 2014-05-09 LAB — ETHANOL: Alcohol, Ethyl (B): <5 mg/dL (ref 0–9)

## 2014-05-09 MED ORDER — BENZTROPINE MESYLATE 1 MG PO TABS
1.0000 mg | ORAL_TABLET | Freq: Two times a day (BID) | ORAL | Status: DC
  Administered 2014-05-09 – 2014-05-15 (×12): 1 mg via ORAL
  Filled 2014-05-09 (×13): qty 1

## 2014-05-09 MED ORDER — ZOLPIDEM TARTRATE 5 MG PO TABS
5.0000 mg | ORAL_TABLET | Freq: Every evening | ORAL | Status: DC | PRN
  Administered 2014-05-13 – 2014-05-15 (×2): 5 mg via ORAL
  Filled 2014-05-09 (×2): qty 1

## 2014-05-09 MED ORDER — ONDANSETRON HCL 4 MG PO TABS: 4.0000 mg | ORAL_TABLET | Freq: Three times a day (TID) | ORAL | Status: DC | PRN

## 2014-05-09 MED ORDER — LORAZEPAM 1 MG PO TABS: 1.0000 mg | ORAL_TABLET | Freq: Three times a day (TID) | ORAL | Status: DC | PRN

## 2014-05-09 MED ORDER — MIRTAZAPINE 30 MG PO TABS
30.0000 mg | ORAL_TABLET | Freq: Every day | ORAL | Status: DC
  Administered 2014-05-09 – 2014-05-14 (×6): 30 mg via ORAL
  Filled 2014-05-09 (×6): qty 1

## 2014-05-09 MED ORDER — NICOTINE 21 MG/24HR TD PT24
21.0000 mg | MEDICATED_PATCH | Freq: Every day | TRANSDERMAL | Status: DC
  Administered 2014-05-09 – 2014-05-15 (×7): 21 mg via TRANSDERMAL
  Filled 2014-05-09 (×7): qty 1

## 2014-05-09 MED ORDER — QUETIAPINE FUMARATE ER 300 MG PO TB24
300.0000 mg | ORAL_TABLET | Freq: Every day | ORAL | Status: DC
  Administered 2014-05-09 – 2014-05-14 (×6): 300 mg via ORAL
  Filled 2014-05-09 (×7): qty 1

## 2014-05-09 MED ORDER — ACETAMINOPHEN 325 MG PO TABS: 650.0000 mg | ORAL_TABLET | ORAL | Status: DC | PRN

## 2014-05-09 MED ORDER — DIVALPROEX SODIUM 500 MG PO DR TAB
500.0000 mg | DELAYED_RELEASE_TABLET | Freq: Three times a day (TID) | ORAL | Status: DC
  Administered 2014-05-09 – 2014-05-10 (×3): 500 mg via ORAL
  Filled 2014-05-09 (×3): qty 1

## 2014-05-09 MED ORDER — TOPIRAMATE 25 MG PO TABS
100.0000 mg | ORAL_TABLET | Freq: Three times a day (TID) | ORAL | Status: DC
  Administered 2014-05-09 (×2): 100 mg via ORAL
  Filled 2014-05-09 (×2): qty 4

## 2014-05-09 MED ORDER — ALUM & MAG HYDROXIDE-SIMETH 200-200-20 MG/5ML PO SUSP: 30.0000 mL | ORAL | Status: DC | PRN
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
## 2014-05-09 NOTE — BH Specialist Note (Signed)
Writer informed TTS Kristein of the consult.
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
## 2014-05-09 NOTE — ED Notes (Signed)
Pt sleeping at present, easily arouseable to verbal stimuli.  No distress noted, will continue to monitor for safety.
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
## 2014-05-09 NOTE — ED Notes (Signed)
Patient transferred over from main ED.  States he does not like his group home and feels as thought he is picked on there.  States he is taking his medications as prescribed and the [facility] sent with him does reflect he has taken all medicines.  He does admit to consuming Monster drinks and acknowledges that they are not good for him, but says he likes the caffeine.  He was oriented to his room and to the unit.  He was offered a drink and a snack and was given an extra blanket.
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
## 2014-05-09 NOTE — ED Notes (Signed)
Bed: WBH34 Expected date:  Expected time:  Means of arrival:  Comments: Hold for triage 4 

## 2014-05-09 NOTE — ED Notes (Signed)
Ryan Zuniga, w/ [facility] Act team, [phone].

## 2014-05-10 DIAGNOSIS — F430 Acute stress reaction: Secondary | ICD-10-CM

## 2014-05-10 DIAGNOSIS — R45850 Homicidal ideations: Secondary | ICD-10-CM

## 2014-05-10 DIAGNOSIS — F439 Reaction to severe stress, unspecified: Secondary | ICD-10-CM | POA: Diagnosis present

## 2014-05-10 MED ORDER — DIVALPROEX SODIUM 250 MG PO DR TAB
250.0000 mg | DELAYED_RELEASE_TABLET | Freq: Once | ORAL | Status: AC
  Administered 2014-05-10: 250 mg via ORAL
  Filled 2014-05-10: qty 1

## 2014-05-10 MED ORDER — TOPIRAMATE 25 MG PO TABS
100.0000 mg | ORAL_TABLET | Freq: Three times a day (TID) | ORAL | Status: DC
  Administered 2014-05-10 – 2014-05-13 (×12): 100 mg via ORAL
  Filled 2014-05-10 (×12): qty 4

## 2014-05-10 MED ORDER — DIVALPROEX SODIUM 250 MG PO DR TAB: 250.0000 mg | DELAYED_RELEASE_TABLET | Freq: Once | ORAL | Status: DC

## 2014-05-10 MED ORDER — TOPIRAMATE 25 MG PO TABS: 100.0000 mg | ORAL_TABLET | Freq: Two times a day (BID) | ORAL | Status: DC

## 2014-05-10 MED ORDER — DIVALPROEX SODIUM 500 MG PO DR TAB
500.0000 mg | DELAYED_RELEASE_TABLET | Freq: Two times a day (BID) | ORAL | Status: DC
Start: 2014-05-10 — End: 2014-05-15
  Administered 2014-05-10 – 2014-05-15 (×10): 500 mg via ORAL
  Filled 2014-05-10 (×10): qty 1

## 2014-05-10 MED ORDER — DIVALPROEX SODIUM 500 MG PO DR TAB: 500.0000 mg | DELAYED_RELEASE_TABLET | Freq: Every day | ORAL | Status: DC

## 2014-05-10 NOTE — ED Notes (Signed)
Patient reports waiting for placement. Currently denies any SI or HI. Denies any a/v hallucinations. Behavior appropriate on the unit.
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
## 2014-05-10 NOTE — ED Notes (Signed)
Report received from [facility]. Pt. Alert and oriented in no distress denies SI, HI, AVH and pain.  Pt. Instructed to come to me with problems or concerns.Will continue to monitor for safety via security cameras and Q 15 minute checks.
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
## 2014-05-10 NOTE — Progress Notes (Signed)
CSW sent FL2 information to [facility] regarding placement for patient.   Mr.Kelly of [facility]" reached out to CSW and stated that he has patient under review.  Kelly/([phone]) [place], [place] ED CSW 05/10/2014 8:47 PM
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
## 2014-05-11 DIAGNOSIS — F209 Schizophrenia, unspecified: Secondary | ICD-10-CM
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
## 2014-05-11 NOTE — Progress Notes (Signed)
Shoji has been calm and appropriate on the unit today, spending most of his time lying in bed.

## 2014-05-11 NOTE — Progress Notes (Signed)
Pt's mom said that sometimes the best way to reach Ryan Zuniga (pt's guardian) is by texting [phone] instead of calling.

## 2014-05-11 NOTE — ED Notes (Signed)
Report received from Karen RN. Pt. Alert in no distress denies SI, HI, AVH and pain.  Pt. Instructed to come to me with problems or concerns.Will continue to monitor for safety via security cameras and Q 15 minute checks. 
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
## 2014-05-12 NOTE — ED Notes (Signed)
Report received from [facility]. Pt. Alert and oriented in no distress denies SI, HI, AVH and pain.  Pt. Instructed to come to me with problems or concerns.Will continue to monitor for safety via security cameras and Q 15 minute checks.

## 2014-05-13 DIAGNOSIS — F259 Schizoaffective disorder, unspecified: Secondary | ICD-10-CM
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
## 2014-05-13 NOTE — Progress Notes (Addendum)
CSW left message for pt guardian, heather hicks [phone] and sent text message.  CSW called and left message for Lear Ng [phone] CSW called [facility], however admissions unavailable at this time. CSW to call back.   Noreene Larsson [phone]  ED CSW 05/13/2014 8:32 AM  CSW spoke with Crystal at [facility] who will review patient and pass along to sister facilities.   Noreene Larsson [phone]  ED CSW 05/13/2014 1434pm

## 2014-05-14 DIAGNOSIS — F329 Major depressive disorder, single episode, unspecified: Secondary | ICD-10-CM

## 2014-05-14 DIAGNOSIS — F419 Anxiety disorder, unspecified: Secondary | ICD-10-CM

## 2014-05-14 DIAGNOSIS — R454 Irritability and anger: Secondary | ICD-10-CM

## 2014-05-14 MED ORDER — TUBERCULIN PPD 5 UNIT/0.1ML ID SOLN
5.0000 [IU] | Freq: Once | INTRADERMAL | Status: DC
Start: 2014-05-14 — End: 2014-05-15
  Administered 2014-05-14: 5 [IU] via INTRADERMAL
  Filled 2014-05-14 (×2): qty 0.1

## 2014-05-14 MED ORDER — TOPIRAMATE 25 MG PO TABS: 100.0000 mg | ORAL_TABLET | Freq: Three times a day (TID) | ORAL | Status: DC

## 2014-05-14 MED ORDER — TOPIRAMATE 100 MG PO TABS
100.0000 mg | ORAL_TABLET | Freq: Three times a day (TID) | ORAL | Status: DC
  Administered 2014-05-14 – 2014-05-15 (×4): 100 mg via ORAL
  Filled 2014-05-14 (×4): qty 1
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
## 2014-05-14 NOTE — Progress Notes (Signed)
EDCM spoke to patient guardian [name] in conference room.  Ms. Ishmael Holter confirmed patient's pcp is Dr. Legrand Rams in [place], but patient has not seen pcp in a while and was looking for pcp in [place].  EDCM offered patient's guardian list of pcps who accept Medicaid in [place], but she refused.  Per patient's guardian, will find out and follow up with doctor at [facility].  No further EDCM needs at this time.
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
## 2014-05-14 NOTE — Progress Notes (Signed)
Pt alert, calm and cooperative. Denies SI/HI, verbally contracts for safety. -A/[place]. Denies pain or discomfort. Emotional support and encouragement given. Will continue to monitor closely and evaluate for stabilization.

## 2014-05-14 NOTE — ED Notes (Signed)
PPD placed in left arm and circled with correct site marker. Pt tolerated well.
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
## 2014-05-14 NOTE — Progress Notes (Signed)
Per [facility] patient has been accepted, however bed not available till tomorrow. [facility] requested tb skin test to be placed and will be read at facility.   Noreene Larsson [phone]  ED CSW 05/14/2014 9:36 AM

## 2014-05-14 NOTE — ED Notes (Signed)
Patient calm, cooperative. Denies SI, HI, AVH. Denies feelings of depression and of anxiety. Reports trouble staying asleep at night.   Encouragement offered. Snack provided.  Q 15 safety checks continue.

## 2014-05-15 DIAGNOSIS — F251 Schizoaffective disorder, depressive type: Secondary | ICD-10-CM

## 2014-05-15 MED ORDER — BENZTROPINE MESYLATE 1 MG PO TABS
1.0000 mg | ORAL_TABLET | Freq: Two times a day (BID) | ORAL | Status: DC
Start: 2014-05-15 — End: 2015-03-30

## 2014-05-15 MED ORDER — MIRTAZAPINE 30 MG PO TABS: 30.0000 mg | ORAL_TABLET | Freq: Every day | ORAL | Status: DC

## 2014-05-15 MED ORDER — DIVALPROEX SODIUM 500 MG PO DR TAB: 500.0000 mg | DELAYED_RELEASE_TABLET | Freq: Two times a day (BID) | ORAL | Status: DC

## 2014-05-15 MED ORDER — TOPIRAMATE 100 MG PO TABS: 100.0000 mg | ORAL_TABLET | Freq: Three times a day (TID) | ORAL | Status: DC

## 2014-05-15 MED ORDER — QUETIAPINE FUMARATE ER 300 MG PO TB24: 300.0000 mg | ORAL_TABLET | Freq: Every day | ORAL | Status: DC
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
## 2014-05-15 NOTE — ED Notes (Signed)
Patient up x2, to restroom. Reports trouble with sleep.

## 2014-05-15 NOTE — Progress Notes (Signed)
Patients's guardian visited at bedside. Guardian was accompanied by CSW.  Willette Brace [phone] ED CSW 05/15/2014 12:13 AM

## 2014-05-15 NOTE — Progress Notes (Signed)
RN to call report to [phone].   Noreene Larsson [phone]  ED CSW 05/15/2014 11:10 AM

## 2014-05-15 NOTE — [facility] Suicide Risk Assessment (Cosign Needed)
Suicide Risk Assessment  Discharge Assessment   [facility] Discharge Suicide Risk Assessment   Demographic Factors:  Caucasian  Total Time spent with patient: 30 minutes  Musculoskeletal: Strength & Muscle Tone: within normal limits Gait & Station: normal Patient leans: N/A  Psychiatric Specialty Exam:     Blood pressure 103/64, pulse 69, temperature 97.7 F (36.5 C), temperature source Oral, resp. rate 18, SpO2 99 %.There is no height or weight on file to calculate BMI.  General Appearance: Casual and Fairly Groomed  Eye Contact::  Good  Speech:  Clear and Coherent, Garbled and MR Speech impediment409  Volume:  Normal  Mood:  Depressed  Affect:  Congruent and Depressed  Thought Process:  Coherent, Goal Directed and Intact  Orientation:  Full (Time, Place, and Person)  Thought Content:  WDL  Suicidal Thoughts:  No  Homicidal Thoughts:  No  Memory:  Immediate;   Good Recent;   Good Remote;   Fair  Judgement:  Fair  Insight:  Good  Psychomotor Activity:  Normal  Concentration:  Good  Recall:  NA  Fund of Knowledge:Fair  Language: Fair  Akathisia:  NA  Handed:  Right  AIMS (if indicated):     Assets:  Desire for Improvement  Sleep:     Cognition: WNL  ADL's:  Intact      Has this patient used any form of tobacco in the last 30 days? (Cigarettes, Smokeless Tobacco, Cigars, and/or Pipes) N/A  Mental Status Per Nursing Assessment::   On Admission:     Current Mental Status by Physician: NA  Loss Factors: NA  Historical Factors: NA  Risk Reduction Factors:   Living with another person, especially a relative and Positive social support  Continued Clinical Symptoms:  Bipolar Disorder:   Depressive phase Depression:   Insomnia  Cognitive Features That Contribute To Risk:  Closed-mindedness and Polarized thinking    Suicide Risk:  Minimal: No identifiable suicidal ideation.  Patients presenting with no risk factors but with morbid ruminations; may be  classified as minimal risk based on the severity of the depressive symptoms  Principal Problem: Homicidal ideations Discharge Diagnoses:  Patient Active Problem List   Diagnosis Date Noted  . Schizoaffective disorder, depressive type [F25.1]   . Emotional stress reaction [F43.0] 05/10/2014  . Schizoaffective disorder, unspecified type [F25.9] 05/10/2014  . Homicidal ideations [R45.850] 05/10/2014    Follow-up Information    Please follow up.   Why:  Please read ppd test on left arm by Thursday 05/16/2014 1:30pm    Contact information:   [place] [facility]/Follow-up recommendations:  Activity:  as tolerated Diet:  Regular  Is patient on multiple antipsychotic therapies at discharge:  No   Has Patient had three or more failed trials of antipsychotic monotherapy by history:  No  Recommended Plan for Multiple Antipsychotic Therapies: NA    Hendrik Donath, C   PMHNP-BC 05/15/2014, 10:53 AM

## 2014-05-15 NOTE — Progress Notes (Signed)
CSW confirmed with Crystal at [facility] pt admission today. Patient to be transported by cab is agreed upon by guardian and [facility]. Patient to be discharged with fl2, prescriptions, and information regarding tb skin test.   Noreene Larsson [phone]  ED CSW 05/15/2014 958am

## 2014-05-15 NOTE — ED Notes (Signed)
Per Ryan Zuniga SW pt's guardian is aware that pt is being discharged to [facility] Voucher provided, returned all belongings to patient, AVS faxed to facility, pt given envelope with D/C info/FL2/prescriptions to be given to facility upon his arrival.

## 2014-05-16 ENCOUNTER — Encounter (HOSPITAL_COMMUNITY): Payer: Self-pay | Admitting: Emergency Medicine

## 2014-05-16 ENCOUNTER — Emergency Department (HOSPITAL_COMMUNITY)
Admission: EM | Admit: 2014-05-16 | Discharge: 2014-05-17 | Disposition: A | Discharge status: Home / Self Care | Payer: Medicaid Other | Attending: Emergency Medicine | Admitting: Emergency Medicine

## 2014-05-16 DIAGNOSIS — R45851 Suicidal ideations: Secondary | ICD-10-CM | POA: Diagnosis present

## 2014-05-16 DIAGNOSIS — Z720 Tobacco use: Secondary | ICD-10-CM | POA: Insufficient documentation

## 2014-05-16 DIAGNOSIS — F259 Schizoaffective disorder, unspecified: Secondary | ICD-10-CM | POA: Diagnosis not present

## 2014-05-16 DIAGNOSIS — F439 Reaction to severe stress, unspecified: Secondary | ICD-10-CM | POA: Diagnosis present

## 2014-05-16 DIAGNOSIS — F430 Acute stress reaction: Secondary | ICD-10-CM | POA: Diagnosis not present

## 2014-05-16 DIAGNOSIS — Z880 Allergy status to penicillin: Secondary | ICD-10-CM | POA: Diagnosis not present

## 2014-05-16 DIAGNOSIS — Z79899 Other long term (current) drug therapy: Secondary | ICD-10-CM | POA: Insufficient documentation

## 2014-05-16 LAB — COMPREHENSIVE METABOLIC PANEL
ALBUMIN: 4.3 g/dL (ref 3.5–5.2)
ALT: 21 U/L (ref 0–53)
ANION GAP: 6 (ref 5–15)
AST: 21 U/L (ref 0–37)
Alkaline Phosphatase: 62 U/L (ref 39–117)
BUN: 8 mg/dL (ref 6–23)
CALCIUM: 9.4 mg/dL (ref 8.4–10.5)
CO2: 24 mmol/L (ref 19–32)
Chloride: 109 mmol/L (ref 96–112)
Creatinine, Ser: 0.84 mg/dL (ref 0.50–1.35)
GLUCOSE: 85 mg/dL (ref 70–99)
Potassium: 4.1 mmol/L (ref 3.5–5.1)
Sodium: 139 mmol/L (ref 135–145)
Total Bilirubin: 0.3 mg/dL (ref 0.3–1.2)
Total Protein: 7.1 g/dL (ref 6.0–8.3)

## 2014-05-16 LAB — CBC WITH DIFFERENTIAL/PLATELET
BASOS PCT: 0 % (ref 0–1)
Basophils Absolute: 0 10*3/uL (ref 0.0–0.1)
Eosinophils Absolute: 0.1 10*3/uL (ref 0.0–0.7)
Eosinophils Relative: 1 % (ref 0–5)
HEMATOCRIT: 43.2 % (ref 39.0–52.0)
HEMOGLOBIN: 14.7 g/dL (ref 13.0–17.0)
Lymphocytes Relative: 40 % (ref 12–46)
Lymphs Abs: 2.8 10*3/uL (ref 0.7–4.0)
MCH: 34.7 pg — ABNORMAL HIGH (ref 26.0–34.0)
MCHC: 34 g/dL (ref 30.0–36.0)
MCV: 101.9 fL — ABNORMAL HIGH (ref 78.0–100.0)
MONOS PCT: 10 % (ref 3–12)
Monocytes Absolute: 0.7 10*3/uL (ref 0.1–1.0)
Neutro Abs: 3.4 10*3/uL (ref 1.7–7.7)
Neutrophils Relative %: 49 % (ref 43–77)
PLATELETS: 236 10*3/uL (ref 150–400)
RBC: 4.24 MIL/uL (ref 4.22–5.81)
RDW: 12.5 % (ref 11.5–15.5)
WBC: 7 10*3/uL (ref 4.0–10.5)

## 2014-05-16 LAB — RAPID URINE DRUG SCREEN, HOSP PERFORMED
Amphetamines: NOT DETECTED
BARBITURATES: NOT DETECTED
Benzodiazepines: NOT DETECTED
COCAINE: NOT DETECTED
Opiates: NOT DETECTED
TETRAHYDROCANNABINOL: NOT DETECTED

## 2014-05-16 LAB — ETHANOL

## 2014-05-16 MED ORDER — DIVALPROEX SODIUM 500 MG PO DR TAB
500.0000 mg | DELAYED_RELEASE_TABLET | Freq: Two times a day (BID) | ORAL | Status: DC
  Administered 2014-05-16 – 2014-05-17 (×2): 500 mg via ORAL
  Filled 2014-05-16 (×2): qty 1

## 2014-05-16 MED ORDER — QUETIAPINE FUMARATE ER 50 MG PO TB24
150.0000 mg | ORAL_TABLET | Freq: Every day | ORAL | Status: DC
  Administered 2014-05-16: 150 mg via ORAL
  Filled 2014-05-16 (×2): qty 3

## 2014-05-16 MED ORDER — MIRTAZAPINE 30 MG PO TABS
30.0000 mg | ORAL_TABLET | Freq: Every day | ORAL | Status: DC
  Administered 2014-05-16: 30 mg via ORAL
  Filled 2014-05-16: qty 1

## 2014-05-16 MED ORDER — ACETAMINOPHEN 325 MG PO TABS
650.0000 mg | ORAL_TABLET | ORAL | Status: DC | PRN
Start: 2014-05-16 — End: 2014-05-17

## 2014-05-16 MED ORDER — ONDANSETRON HCL 4 MG PO TABS: 4.0000 mg | ORAL_TABLET | Freq: Three times a day (TID) | ORAL | Status: DC | PRN

## 2014-05-16 MED ORDER — BENZTROPINE MESYLATE 1 MG PO TABS
1.0000 mg | ORAL_TABLET | Freq: Two times a day (BID) | ORAL | Status: DC
  Administered 2014-05-16 – 2014-05-17 (×2): 1 mg via ORAL
  Filled 2014-05-16 (×2): qty 1

## 2014-05-16 MED ORDER — NICOTINE 21 MG/24HR TD PT24
21.0000 mg | MEDICATED_PATCH | Freq: Every day | TRANSDERMAL | Status: DC
  Filled 2014-05-16: qty 1

## 2014-05-16 NOTE — ED Provider Notes (Signed)
CSN: [id number]     Arrival date & time 05/16/14  2024 History   First MD Initiated Contact with Patient 05/16/14 2034     This chart was scribed for non-physician practitioner, Charlann Lange PA-C, working with Blanchie Dessert, MD by Forrestine Him, ED Scribe. This patient was seen in room WTR4/WLPT4 and the patient's care was started at 8:35 PM.   Chief Complaint  Patient presents with  . Suicidal  . Hallucinations   HPI  HPI Comments: Ryan Zuniga is a 34 y.o. male with a PMHx of schizoaffective disorder who presents to the Emergency Department here for suicidal ideation onset yesterday. Pt admits to auditory hallucinations which he states are currently telling him to hurt himself. No recent attempt to hurt himself since yesterday but admits to attempting suicide in the past. No HI at this time. He denies any fever, chills, or abdominal pain. Pt with known allergies to aspirin, penicillins, sulfa antibiotics; and codeine.   Past Medical History  Diagnosis Date  . Schizoaffective disorder    Past Surgical History  Procedure Laterality Date  . Nasal sinus surgery    . Inner ear surgery     No family history on file. History  Substance Use Topics  . Smoking status: Current Every Day Smoker  . Smokeless tobacco: Not on file  . Alcohol Use: No     Comment: former    Review of Systems  Constitutional: Negative for fever and chills.  Gastrointestinal: Negative for abdominal pain.  Psychiatric/Behavioral: Positive for suicidal ideas and hallucinations. Negative for self-injury.      Allergies  Aspirin; Penicillins; Sulfa antibiotics; and Codeine  Home Medications   Prior to Admission medications   Medication Sig Start Date End Date Taking? Authorizing Provider  benztropine (COGENTIN) 1 MG tablet Take 1 tablet (1 mg total) by mouth 2 (two) times daily. 05/15/14   Delfin Gant, NP  divalproex (DEPAKOTE) 500 MG DR tablet Take 1 tablet (500 mg total) by mouth every  12 (twelve) hours. 05/15/14   Delfin Gant, NP  mirtazapine (REMERON) 30 MG tablet Take 1 tablet (30 mg total) by mouth at bedtime. 05/15/14   Delfin Gant, NP  QUEtiapine (SEROQUEL XR) 300 MG 24 hr tablet Take 1 tablet (300 mg total) by mouth at bedtime. 05/15/14   Delfin Gant, NP  topiramate (TOPAMAX) 100 MG tablet Take 1 tablet (100 mg total) by mouth 3 (three) times daily. 05/15/14   Delfin Gant, NP   Triage Vitals: BP 136/96 mmHg  Pulse 106  Temp(Src) 98.2 F (36.8 C)  Resp 20  SpO2 100%   Physical Exam  Constitutional: He is oriented to person, place, and time. He appears well-developed and well-nourished.  HENT:  Head: Normocephalic.  Eyes: EOM are normal.  Neck: Normal range of motion.  Cardiovascular: Normal rate, regular rhythm and normal heart sounds.   Pulmonary/Chest: Effort normal.  Abdominal: He exhibits no distension.  Musculoskeletal: Normal range of motion.  Neurological: He is alert and oriented to person, place, and time.  Psychiatric: He has a normal mood and affect.  Nursing note and vitals reviewed.   ED Course  Procedures (including critical care time)  DIAGNOSTIC STUDIES: Oxygen Saturation is 100% on RA, Normal by my interpretation.    COORDINATION OF CARE: 8:41 PM-Discussed treatment plan with pt at bedside and pt agreed to plan.     Labs Review Labs Reviewed - No data to display  Imaging Review No results found.  EKG Interpretation None      MDM   Final diagnoses:  None    1. SI 2. Hallucinations  Patient is calm and cooperative. He endorses auditory hallucinations with command voices for self harm. Will have TTS consult to determine appropriate disposition.  I personally performed the services described in this documentation, which was scribed in my presence. The recorded information has been reviewed and is accurate.     Dewaine Oats, PA-C 05/16/14 2142  Blanchie Dessert, MD 05/17/14 0005

## 2014-05-16 NOTE — ED Notes (Signed)
Pt has been seen and wanded by security.  Pt's belongings documented.
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
## 2014-05-16 NOTE — ED Notes (Signed)
Pt brought in by police voluntarily  Pt states he has been hearing voices and is wanting to commit suicide  Pt has hx of same and has been here for same in the past  Pt lives at [facility] is calm and cooperative

## 2014-05-16 NOTE — ED Notes (Signed)
Patient calm, cooperative. Reports SI and AH telling him to harm himself. Patient is feeling anxious and depressed at present. Has disturbed/interrupted sleep at night.   Encouragement offered. Patient to shower.  Q 15 safety checks in place.

## 2014-05-17 DIAGNOSIS — F259 Schizoaffective disorder, unspecified: Secondary | ICD-10-CM

## 2014-05-17 DIAGNOSIS — R45851 Suicidal ideations: Secondary | ICD-10-CM

## 2014-05-17 LAB — VALPROIC ACID LEVEL: Valproic Acid Lvl: 97.4 ug/mL (ref 50.0–100.0)

## 2014-05-17 NOTE — ED Notes (Signed)
Patient discharged back to group home.  All belongings returned.  Patient left the unit ambulatory.  Denies suicidal thoughts or thoughts of hurting others.

## 2014-05-17 NOTE — BHH Counselor (Signed)
In preparation for [facility] assessment, TTS Counselor reviewed pt chart and nursing/MD notes.   Counselor could not begin assessment as soon as desired because pt was taking shower. Pt's RN Jinny Blossom) informed TTS Counselor when pt was back in room. Assessment to begin shortly.  Ramond Dial, [facility] Triage Specialist
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
## 2014-05-17 NOTE — ED Notes (Signed)
Ryan Zuniga, patient's mother called. [phone].

## 2014-05-17 NOTE — BHH Counselor (Signed)
Per Glenda Chroman, NP, pt meets criteria for inpatient treatment. No beds at [facility]. TTS to seek placement.  Ramond Dial, [facility] Triage Specialist

## 2014-05-17 NOTE — Progress Notes (Signed)
CSW contacted pt guardian, [name] [phone] awaiting return call. Patient from [facility] assisted living, which is a new placement.   Noreene Larsson [phone]  ED CSW 05/17/2014 7:51 AM

## 2014-05-17 NOTE — Progress Notes (Signed)
CSW spoke with guardian heather hicks who shared that patient continues to feel si and hi whenever he is in a new place and this is his baseline. Pt guardian requesting patient to go back to [facility]. Patient guardian states that patient parents have sometimes been a trigger for patient. Per [facility] patient father came to visit, promised patient a playstation 3 for Christmas. Patient father also promise that if patient did well he could come live with him. CSW informed guardian of patient father's visit.   Noreene Larsson [phone]  ED CSW 05/17/2014 1204pm

## 2014-05-17 NOTE — BHH Suicide Risk Assessment (Signed)
Suicide Risk Assessment  Discharge Assessment   [facility] Discharge Suicide Risk Assessment   Demographic Factors:  Male and Caucasian  Total Time spent with patient: 45 minutes  Musculoskeletal: Strength & Muscle Tone: within normal limits Gait & Station: normal Patient leans: N/A  Psychiatric Specialty Exam:     Blood pressure 122/67, pulse 90, temperature 98.1 F (36.7 C), temperature source Oral, resp. rate 16, SpO2 98 %.There is no height or weight on file to calculate BMI.  General Appearance: Casual  Eye Contact::  Good  Speech:  Normal Rate, impediment  Volume:  Normal  Mood:  Anxious  Affect:  Blunt  Thought Process:  Coherent  Orientation:  Full (Time, Place, and Person)  Thought Content:  WDL  Suicidal Thoughts:  No  Homicidal Thoughts:  No  Memory:  Immediate;   Good Recent;   Good Remote;   Good  Judgement:  Fair  Insight:  Fair  Psychomotor Activity:  Normal  Concentration:  Good  Recall:  [place] of Knowledge:Fair  Language: Fair  Akathisia:  No  Handed:  Right  AIMS (if indicated):     Assets:  Catering manager Housing Leisure Time Physical Health Resilience Social Support  ADL's:  Intact  Cognition: WNL  Sleep:         Has this patient used any form of tobacco in the last 30 days? (Cigarettes, Smokeless Tobacco, Cigars, and/or Pipes) N/A  Mental Status Per Nursing Assessment::   On Admission:   Suicidal ideations  Current Mental Status by Physician: NA  Loss Factors: NA  Historical Factors: NA  Risk Reduction Factors:   Sense of responsibility to family, Living with another person, especially a relative, Positive social support and Positive therapeutic relationship  Continued Clinical Symptoms:  Anxiety, mild  Cognitive Features That Contribute To Risk:  None    Suicide Risk:  Minimal: No identifiable suicidal ideation.  Patients presenting with no risk factors but with morbid ruminations; may be classified as  minimal risk based on the severity of the depressive symptoms  Principal Problem: Suicidal ideations Discharge Diagnoses:  Patient Active Problem List   Diagnosis Date Noted  . Suicidal ideations [R45.851] 05/17/2014    Priority: High  . Emotional stress reaction [F43.0] 05/10/2014    Priority: High  . Schizoaffective disorder, unspecified type [F25.9] 05/10/2014    Priority: High  . Homicidal ideations [R45.850] 05/10/2014    Priority: High  . Schizoaffective disorder, depressive type [F25.1]       Plan Of Care/Follow-up recommendations:  Activity:  as tolerated Diet:  heart healthy diet  Is patient on multiple antipsychotic therapies at discharge:  No   Has Patient had three or more failed trials of antipsychotic monotherapy by history:  No  Recommended Plan for Multiple Antipsychotic Therapies: NA    Americus Scheurich, PMh-NP 05/17/2014, 1:15 PM

## 2014-05-18 ENCOUNTER — Encounter (HOSPITAL_COMMUNITY): Payer: Self-pay | Admitting: Cardiology

## 2014-05-18 ENCOUNTER — Emergency Department (HOSPITAL_COMMUNITY)
Admission: EM | Admit: 2014-05-18 | Discharge: 2014-05-18 | Disposition: A | Discharge status: Home / Self Care | Payer: Medicaid Other | Attending: Emergency Medicine | Admitting: Emergency Medicine

## 2014-05-18 DIAGNOSIS — F439 Reaction to severe stress, unspecified: Secondary | ICD-10-CM

## 2014-05-18 DIAGNOSIS — R45851 Suicidal ideations: Secondary | ICD-10-CM | POA: Diagnosis present

## 2014-05-18 DIAGNOSIS — F259 Schizoaffective disorder, unspecified: Secondary | ICD-10-CM | POA: Insufficient documentation

## 2014-05-18 DIAGNOSIS — F438 Other reactions to severe stress: Secondary | ICD-10-CM | POA: Insufficient documentation

## 2014-05-18 DIAGNOSIS — Z79899 Other long term (current) drug therapy: Secondary | ICD-10-CM | POA: Insufficient documentation

## 2014-05-18 DIAGNOSIS — Z720 Tobacco use: Secondary | ICD-10-CM | POA: Diagnosis not present

## 2014-05-18 DIAGNOSIS — Z880 Allergy status to penicillin: Secondary | ICD-10-CM | POA: Diagnosis not present

## 2014-05-18 HISTORY — DX: Personal history of self-harm: Z91.5

## 2014-05-18 HISTORY — DX: Personal history of suicidal behavior: Z91.51

## 2014-05-18 LAB — COMPREHENSIVE METABOLIC PANEL
ALBUMIN: 4.3 g/dL (ref 3.5–5.2)
ALT: 23 U/L (ref 0–53)
AST: 29 U/L (ref 0–37)
Alkaline Phosphatase: 61 U/L (ref 39–117)
Anion gap: 9 (ref 5–15)
BUN: 6 mg/dL (ref 6–23)
CO2: 23 mmol/L (ref 19–32)
Calcium: 9.3 mg/dL (ref 8.4–10.5)
Chloride: 102 mmol/L (ref 96–112)
Creatinine, Ser: 0.9 mg/dL (ref 0.50–1.35)
GFR calc non Af Amer: >90 mL/min (ref >90)
GLUCOSE: 72 mg/dL (ref 70–99)
POTASSIUM: 3 mmol/L — AB (ref 3.5–5.1)
SODIUM: 134 mmol/L — AB (ref 135–145)
Total Bilirubin: 0.8 mg/dL (ref 0.3–1.2)
Total Protein: 7 g/dL (ref 6.0–8.3)

## 2014-05-18 LAB — RAPID URINE DRUG SCREEN, HOSP PERFORMED
Amphetamines: NOT DETECTED
Barbiturates: NOT DETECTED
Benzodiazepines: NOT DETECTED
COCAINE: NOT DETECTED
Opiates: NOT DETECTED
Tetrahydrocannabinol: NOT DETECTED

## 2014-05-18 LAB — CBC
HCT: 40.6 % (ref 39.0–52.0)
HEMOGLOBIN: 14.3 g/dL (ref 13.0–17.0)
MCH: 34.4 pg — ABNORMAL HIGH (ref 26.0–34.0)
MCHC: 35.2 g/dL (ref 30.0–36.0)
MCV: 97.6 fL (ref 78.0–100.0)
Platelets: 221 10*3/uL (ref 150–400)
RBC: 4.16 MIL/uL — ABNORMAL LOW (ref 4.22–5.81)
RDW: 12.1 % (ref 11.5–15.5)
WBC: 6.6 10*3/uL (ref 4.0–10.5)

## 2014-05-18 LAB — ACETAMINOPHEN LEVEL

## 2014-05-18 LAB — ETHANOL: Alcohol, Ethyl (B): <5 mg/dL (ref 0–9)

## 2014-05-18 LAB — SALICYLATE LEVEL

## 2014-05-18 MED ORDER — POTASSIUM CHLORIDE CRYS ER 20 MEQ PO TBCR
40.0000 meq | EXTENDED_RELEASE_TABLET | Freq: Once | ORAL | Status: AC
  Administered 2014-05-18: 40 meq via ORAL
  Filled 2014-05-18: qty 2

## 2014-05-18 NOTE — ED Provider Notes (Signed)
CSN: [id number]     Arrival date & time 05/18/14  1144 History   First MD Initiated Contact with Patient 05/18/14 1208     Chief Complaint  Patient presents with  . Suicidal     (Consider location/radiation/quality/duration/timing/severity/associated sxs/prior Treatment) HPI   34 year old male with history of schizoaffective disorder who presents for evaluation of suicidal ideation. History is difficult to obtain as patient has a stuttering illness.  Patient report he is currently feeling suicidal with plan to run in front of a car and kill himself. His reason is that he recently moved to an assisted living facility this past week and the people that he is living with does not treat him well and are being mean to him. He is not sleeping or eating much. He denies self medicating with alcohol or street drugs. He denies any medication changes. Denies homicidal ideation or hallucination. He has no other complaint. He was seen in the ER 2 days ago for the same complaint and was psychiatrically assessed and discharge.  Past Medical History  Diagnosis Date  . Schizoaffective disorder    Past Surgical History  Procedure Laterality Date  . Nasal sinus surgery    . Inner ear surgery     History reviewed. No pertinent family history. History  Substance Use Topics  . Smoking status: Current Every Day Smoker  . Smokeless tobacco: Not on file  . Alcohol Use: No     Comment: former    Review of Systems  Constitutional: Negative for fever.  Respiratory: Negative for chest tightness and shortness of breath.   Gastrointestinal: Negative for abdominal pain.  Neurological: Negative for headaches.  Psychiatric/Behavioral: Positive for suicidal ideas.  All other systems reviewed and are negative.     Allergies  Aspirin; Penicillins; Sulfa antibiotics; and Codeine  Home Medications   Prior to Admission medications   Medication Sig Start Date End Date Taking? Authorizing Provider   benztropine (COGENTIN) 1 MG tablet Take 1 tablet (1 mg total) by mouth 2 (two) times daily. 05/15/14   Delfin Gant, NP  divalproex (DEPAKOTE) 500 MG DR tablet Take 1 tablet (500 mg total) by mouth every 12 (twelve) hours. Patient taking differently: Take 500 mg by mouth 3 (three) times daily.  05/15/14   Delfin Gant, NP  mirtazapine (REMERON) 30 MG tablet Take 1 tablet (30 mg total) by mouth at bedtime. 05/15/14   Delfin Gant, NP  QUEtiapine Fumarate (SEROQUEL XR) 150 MG 24 hr tablet Take 150 mg by mouth at bedtime.    Historical Provider, MD  topiramate (TOPAMAX) 100 MG tablet Take 1 tablet (100 mg total) by mouth 3 (three) times daily. 05/15/14   Delfin Gant, NP   BP 136/88 mmHg  Pulse 114  Temp(Src) 98.1 F (36.7 C)  Resp 18  Ht 5\' 10"  (1.778 m)  Wt 160 lb (72.576 kg)  BMI 22.96 kg/m2  SpO2 97% Physical Exam  Constitutional: He appears well-developed and well-nourished. No distress.  HENT:  Head: Atraumatic.  Mouth/Throat: Oropharynx is clear and moist.  Stuttering  Eyes: Conjunctivae are normal.  Neck: Normal range of motion. Neck supple.  Cardiovascular: Normal rate and regular rhythm.   Pulmonary/Chest: Effort normal and breath sounds normal.  Abdominal: Soft. There is no tenderness.  Neurological: He is alert. GCS eye subscore is 4. GCS verbal subscore is 5. GCS motor subscore is 6.  Skin: No rash noted.  Psychiatric: He has a normal mood and affect. His speech is  delayed (Stuttering). He is withdrawn. He expresses suicidal ideation. He expresses no homicidal ideation.    ED Course  Procedures (including critical care time)  Patient with suicidal ideation with plan, causative factor includes new environmental changes and not coping well. History of schizoaffective disorder and patient has a stuttering problem. We'll perform medical clearance and will consult TTS further management.  1:35 PM Upon reviewing pt's prior charts, it was noted that pt  has been seen and evaluate for passive SI when he doesn't cope well with his surrounding.  His guardian and social worker has help locate pt to several different group home setting, with the most recent one being this past week. Pt is now medically cleared.  I discussed with Dr. Ashok Cordia and we felt pt will benefit from f/u with his guardian to determine a suitable living situation as we have exhausted our options for placement.  Otherwise pt stable for discharge.    Labs Review Labs Reviewed  ACETAMINOPHEN LEVEL - Abnormal; Notable for the following:    Acetaminophen (Tylenol), Serum <10.0 (*)    All other components within normal limits  CBC - Abnormal; Notable for the following:    RBC 4.16 (*)    MCH 34.4 (*)    All other components within normal limits  COMPREHENSIVE METABOLIC PANEL - Abnormal; Notable for the following:    Sodium 134 (*)    Potassium 3.0 (*)    All other components within normal limits  ETHANOL  SALICYLATE LEVEL  URINE RAPID DRUG SCREEN (HOSP PERFORMED)    Imaging Review No results found.   EKG Interpretation None      MDM   Final diagnoses:  Emotional stress reaction    BP 120/81 mmHg  Pulse 93  Temp(Src) 98 F (36.7 C) (Oral)  Resp 18  Ht 5\' 10"  (1.778 m)  Wt 160 lb (72.576 kg)  BMI 22.96 kg/m2  SpO2 100%     Domenic Moras, PA-C 05/18/14 [place], MD 05/19/14 [phone]

## 2014-05-18 NOTE — ED Notes (Signed)
Pt reports he has been having SI for the past couple of days. States he has a plan to get run over by a car. Denies any ETOH or drug use.

## 2014-05-18 NOTE — Discharge Instructions (Signed)
Please discuss with your guardian on assistance with your living situation and identify the best living arrangement for you.  Follow up with your doctor for further care.    Stress and Stress Management Stress is a normal reaction to life events. It is what you feel when life demands more than you are used to or more than you can handle. Some stress can be useful. For example, the stress reaction can help you catch the last bus of the day, study for a test, or meet a deadline at work. But stress that occurs too often or for too long can cause problems. It can affect your emotional health and interfere with relationships and normal daily activities. Too much stress can weaken your immune system and increase your risk for physical illness. If you already have a medical problem, stress can make it worse. CAUSES  All sorts of life events may cause stress. An event that causes stress for one person may not be stressful for another person. Major life events commonly cause stress. These may be positive or negative. Examples include losing your job, moving into a new home, getting married, having a baby, or losing a loved one. Less obvious life events may also cause stress, especially if they occur day after day or in combination. Examples include working long hours, driving in traffic, caring for children, being in debt, or being in a difficult relationship. SIGNS AND SYMPTOMS Stress may cause emotional symptoms including, the following:  Anxiety. This is feeling worried, afraid, on edge, overwhelmed, or out of control.  Anger. This is feeling irritated or impatient.  Depression. This is feeling sad, down, helpless, or guilty.  Difficulty focusing, remembering, or making decisions. Stress may cause physical symptoms, including the following:   Aches and pains. These may affect your head, neck, back, stomach, or other areas of your body.  Tight muscles or clenched jaw.  Low energy or trouble  sleeping. Stress may cause unhealthy behaviors, including the following:   Eating to feel better (overeating) or skipping meals.  Sleeping too little, too much, or both.  Working too much or putting off tasks (procrastination).  Smoking, drinking alcohol, or using drugs to feel better. DIAGNOSIS  Stress is diagnosed through an assessment by your health care provider. Your health care provider will ask questions about your symptoms and any stressful life events.Your health care provider will also ask about your medical history and may order blood tests or other tests. Certain medical conditions and medicine can cause physical symptoms similar to stress. Mental illness can cause emotional symptoms and unhealthy behaviors similar to stress. Your health care provider may refer you to a mental health professional for further evaluation.  TREATMENT  Stress management is the recommended treatment for stress.The goals of stress management are reducing stressful life events and coping with stress in healthy ways.  Techniques for reducing stressful life events include the following:  Stress identification. Self-monitor for stress and identify what causes stress for you. These skills may help you to avoid some stressful events.  Time management. Set your priorities, keep a calendar of events, and learn to say "no." These tools can help you avoid making too many commitments. Techniques for coping with stress include the following:  Rethinking the problem. Try to think realistically about stressful events rather than ignoring them or overreacting. Try to find the positives in a stressful situation rather than focusing on the negatives.  Exercise. Physical exercise can release both physical and emotional tension. The  key is to find a form of exercise you enjoy and do it regularly.  Relaxation techniques. These relax the body and mind. Examples include yoga, meditation, tai chi, biofeedback, deep  breathing, progressive muscle relaxation, listening to music, being out in nature, journaling, and other hobbies. Again, the key is to find one or more that you enjoy and can do regularly.  Healthy lifestyle. Eat a balanced diet, get plenty of sleep, and do not smoke. Avoid using alcohol or drugs to relax.  Strong support network. Spend time with family, friends, or other people you enjoy being around.Express your feelings and talk things over with someone you trust. Counseling or talktherapy with a mental health professional may be helpful if you are having difficulty managing stress on your own. Medicine is typically not recommended for the treatment of stress.Talk to your health care provider if you think you need medicine for symptoms of stress. HOME CARE INSTRUCTIONS  Keep all follow-up visits as directed by your health care provider.  Take all medicines as directed by your health care provider. SEEK MEDICAL CARE IF:  Your symptoms get worse or you start having new symptoms.  You feel overwhelmed by your problems and can no longer manage them on your own. SEEK IMMEDIATE MEDICAL CARE IF:  You feel like hurting yourself or someone else. Document Released: 09/29/2000 Document Revised: 08/20/2013 Document Reviewed: 11/28/2012 [facility] Patient Information 2015 [place], [place]. This information is not intended to replace advice given to you by your health care provider. Make sure you discuss any questions you have with your health care provider.

## 2014-05-18 NOTE — ED Notes (Signed)
Pt wanded by security. Staffing notified.

## 2014-05-18 NOTE — ED Notes (Signed)
Pt verbalized understanding of discharge instructions. No further questions. Pt states that he no longer wants to hurt himself and is going back to [facility].

## 2014-05-20 ENCOUNTER — Encounter (HOSPITAL_COMMUNITY): Payer: Self-pay | Admitting: Emergency Medicine

## 2014-05-20 ENCOUNTER — Emergency Department (HOSPITAL_COMMUNITY)
Admission: EM | Admit: 2014-05-20 | Discharge: 2014-05-20 | Disposition: A | Discharge status: Home / Self Care | Payer: Medicaid Other | Attending: Emergency Medicine | Admitting: Emergency Medicine

## 2014-05-20 DIAGNOSIS — Z720 Tobacco use: Secondary | ICD-10-CM | POA: Insufficient documentation

## 2014-05-20 DIAGNOSIS — E876 Hypokalemia: Secondary | ICD-10-CM | POA: Insufficient documentation

## 2014-05-20 DIAGNOSIS — Z79899 Other long term (current) drug therapy: Secondary | ICD-10-CM | POA: Insufficient documentation

## 2014-05-20 DIAGNOSIS — R45850 Homicidal ideations: Secondary | ICD-10-CM | POA: Diagnosis present

## 2014-05-20 DIAGNOSIS — Z880 Allergy status to penicillin: Secondary | ICD-10-CM | POA: Diagnosis not present

## 2014-05-20 DIAGNOSIS — F251 Schizoaffective disorder, depressive type: Secondary | ICD-10-CM | POA: Diagnosis not present

## 2014-05-20 LAB — I-STAT CHEM 8, ED
Calcium, Ion: 1.22 mmol/L (ref 1.12–1.23)
Chloride: 107 mmol/L (ref 96–112)
Creatinine, Ser: 0.9 mg/dL (ref 0.50–1.35)
GLUCOSE: 93 mg/dL (ref 70–99)
HEMATOCRIT: 44 % (ref 39.0–52.0)
Hemoglobin: 15 g/dL (ref 13.0–17.0)
POTASSIUM: 3.1 mmol/L — AB (ref 3.5–5.1)
Sodium: 141 mmol/L (ref 135–145)
TCO2: 16 mmol/L (ref 0–100)

## 2014-05-20 MED ORDER — POTASSIUM CHLORIDE CRYS ER 20 MEQ PO TBCR
40.0000 meq | EXTENDED_RELEASE_TABLET | Freq: Once | ORAL | Status: AC
  Administered 2014-05-20: 40 meq via ORAL
  Filled 2014-05-20: qty 2

## 2014-05-20 NOTE — ED Notes (Signed)
Per EMS, pt from [facility].  Pt has had anxiety, insomnia, Homicidal thoughts towards staff, decreased appetite.  Started 3 days ago.  Has been in facility x 1 week.  No HI towards anyone else.  Denies SI. Has DSS contact.  Hx bipolar and schizophrenia per patient.  Vitals: 114 hr, 118/88, resp 18, 96% ra

## 2014-05-20 NOTE — Discharge Instructions (Signed)
Schizoaffective Disorder Schizoaffective disorder (ScAD) is a mental illness. It causes symptoms that are a mixture of schizophrenia (a psychotic disorder) and an affective (mood) disorder. The schizophrenic symptoms may include delusions, hallucinations, or odd behavior. The mood symptoms may be similar to major depression or bipolar disorder. ScAD may interfere with personal relationships or normal daily activities. People with ScAD are at increased risk for job loss, social isolation,physical health problems, anxiety and substance use disorders, and suicide. ScAD usually occurs in cycles. Periods of severe symptoms are followed by periods of less severe symptoms or improvement. The illness affects men and women equally but usually appears at an earlier age (teenage or early adult years) in men. People who have family members with schizophrenia, bipolar disorder, or ScAD are at higher risk of developing ScAD. SYMPTOMS  At any one time, people with ScAD may have psychotic symptoms only or both psychotic and mood symptoms. The psychotic symptoms include one or more of the following:  Hearing, seeing, or feeling things that are not there (hallucinations).   Having fixed, false beliefs (delusions). The delusions usually are of being attacked, harassed, cheated, persecuted, or conspired against (paranoid delusions).  Speaking in a way that makes no sense to others (disorganized speech). The psychotic symptoms of ScAD may also include confusing or odd behavior or any of the negative symptoms of schizophrenia. These include loss of motivation for normal daily activities, such as bathing or grooming, withdrawal from other people, and lack of emotions.  The mood symptoms of ScAD occur more often than not. They resemble major depressive disorder or bipolar mania. Symptoms of major depression include depressed mood and four or more of the following:  Loss of interest in usually pleasurable activities  (anhedonia).  Sleeping more or less than normal.  Feeling worthless or excessively guilty.  Lack of energy or motivation.  Trouble concentrating.  Eating more or less than usual.  Thinking a lot about death or suicide. Symptoms of bipolar mania include abnormally elevated or irritable mood and increased energy or activity, plus three or more of the following:   More confidence than normal or feeling that you are able to do anything (grandiosity).  Feeling rested with less sleep than normal.   Being easily distracted.   Talking more than usual or feeling pressured to keep talking.   Feeling that your thoughts are racing.  Engaging in high-risk activities such as buying sprees or foolish business decisions. DIAGNOSIS  ScAD is diagnosed through an assessment by your health care provider. Your health care provider will observe and ask questions about your thoughts, behavior, mood, and ability to function in daily life. Your health care provider may also ask questions about your medical history and use of drugs, including prescription medicines. Your health care provider may also order blood tests and imaging exams. Certain medical conditions and substances can cause symptoms that resemble ScAD. Your health care provider may refer you to a mental health specialist for evaluation.  ScAD is divided into two types. The depressive type is diagnosed if your mood symptoms are limited to major depression. The bipolar type is diagnosed if your mood symptoms are manic or a mixture of manic and depressive symptoms TREATMENT  ScAD is usually a lifelong illness. Long-term treatment is necessary. The following treatments are available:  Medicine. Different types of medicine are used to treat ScAD. The exact combination depends on the type and severity of your symptoms. Antipsychotic medicine is used to control psychotic symptomssuch as delusions, paranoia,  and hallucinations. Mood stabilizers can  even the highs and lows of bipolar manic mood swings. Antidepressant medicines are used to treat major depressive symptoms.  Counseling or talk therapy. Individual, group, or family counseling may be helpful in providing education, support, and guidance. Many people with ScAD also benefit from social skills and job skills (vocational) training. A combination of medicine and counseling is usually best for managing the disorder over time. A procedure in which electricity is applied to the brain through the scalp (electroconvulsive therapy) may be used to treat people with severe manic symptoms that do not respond to medicine and counseling. HOME CARE INSTRUCTIONS   Take all your medicine as prescribed.  Check with your health care provider before starting new prescription or over-the-counter medicines.  Keep all follow up appointments with your health care provider. SEEK MEDICAL CARE IF:   If you are not able to take your medicines as prescribed.  If your symptoms get worse. SEEK IMMEDIATE MEDICAL CARE IF:   You have serious thoughts about hurting yourself or others. Document Released: 08/16/2006 Document Revised: 08/20/2013 Document Reviewed: 11/17/2012 [facility] Patient Information 2015 [place], [place]. This information is not intended to replace advice given to you by your health care provider. Make sure you discuss any questions you have with your health care provider.    Hypokalemia Hypokalemia means that the amount of potassium in the blood is lower than normal.Potassium is a chemical, called an electrolyte, that helps regulate the amount of fluid in the body. It also stimulates muscle contraction and helps nerves function properly.Most of the body's potassium is inside of cells, and only a very small amount is in the blood. Because the amount in the blood is so small, minor changes can be life-threatening. CAUSES  Antibiotics.  Diarrhea or vomiting.  Using laxatives too much,  which can cause diarrhea.  Chronic kidney disease.  Water pills (diuretics).  Eating disorders (bulimia).  Low magnesium level.  Sweating a lot. SIGNS AND SYMPTOMS  Weakness.  Constipation.  Fatigue.  Muscle cramps.  Mental confusion.  Skipped heartbeats or irregular heartbeat (palpitations).  Tingling or numbness. DIAGNOSIS  Your health care provider can diagnose hypokalemia with blood tests. In addition to checking your potassium level, your health care provider may also check other lab tests. TREATMENT Hypokalemia can be treated with potassium supplements taken by mouth or adjustments in your current medicines. If your potassium level is very low, you may need to get potassium through a vein (IV) and be monitored in the hospital. A diet high in potassium is also helpful. Foods high in potassium are:  Nuts, such as peanuts and pistachios.  Seeds, such as sunflower seeds and pumpkin seeds.  Peas, lentils, and lima beans.  Whole grain and bran cereals and breads.  Fresh fruit and vegetables, such as apricots, avocado, bananas, cantaloupe, kiwi, oranges, tomatoes, asparagus, and potatoes.  Orange and tomato juices.  Red meats.  Fruit yogurt. HOME CARE INSTRUCTIONS  Take all medicines as prescribed by your health care provider.  Maintain a healthy diet by including nutritious food, such as fruits, vegetables, nuts, whole grains, and lean meats.  If you are taking a laxative, be sure to follow the directions on the label. SEEK MEDICAL CARE IF:  Your weakness gets worse.  You feel your heart pounding or racing.  You are vomiting or having diarrhea.  You are diabetic and having trouble keeping your blood glucose in the normal range. SEEK IMMEDIATE MEDICAL CARE IF:  You have  chest pain, shortness of breath, or dizziness.  You are vomiting or having diarrhea for more than 2 days.  You faint. MAKE SURE YOU:   Understand these instructions.  Will watch  your condition.  Will get help right away if you are not doing well or get worse. Document Released: 04/05/2005 Document Revised: 01/24/2013 Document Reviewed: 10/06/2012 [facility] Patient Information 2015 [place], [place]. This information is not intended to replace advice given to you by your health care provider. Make sure you discuss any questions you have with your health care provider.     Potassium Content of Foods Potassium is a mineral found in many foods and drinks. It helps keep fluids and minerals balanced in your body and affects how steadily your heart beats. Potassium also helps control your blood pressure and keep your muscles and nervous system healthy. Certain health conditions and medicines may change the balance of potassium in your body. When this happens, you can help balance your level of potassium through the foods that you do or do not eat. Your health care provider or dietitian may recommend an amount of potassium that you should have each day. The following lists of foods provide the amount of potassium (in parentheses) per serving in each item. HIGH IN POTASSIUM  The following foods and beverages have 200 mg or more of potassium per serving:  Apricots, 2 raw or 5 dry (200 mg).  Artichoke, 1 medium (345 mg).  Avocado, raw,  each (245 mg).  Banana, 1 medium (425 mg).  Beans, lima, or baked beans, canned,  cup (280 mg).  Beans, white, canned,  cup (595 mg).  Beef roast, 3 oz (320 mg).  Beef, ground, 3 oz (270 mg).  Beets, raw or cooked,  cup (260 mg).  Bran muffin, 2 oz (300 mg).  Broccoli,  cup (230 mg).  Brussels sprouts,  cup (250 mg).  Cantaloupe,  cup (215 mg).  Cereal, 100% bran,  cup (200-400 mg).  Cheeseburger, single, fast food, 1 each (225-400 mg).  Chicken, 3 oz (220 mg).  Clams, canned, 3 oz (535 mg).  Crab, 3 oz (225 mg).  Dates, 5 each (270 mg).  Dried beans and peas,  cup (300-475 mg).  Figs, dried, 2 each (260  mg).  Fish: halibut, tuna, cod, snapper, 3 oz (480 mg).  Fish: salmon, haddock, swordfish, perch, 3 oz (300 mg).  Fish, tuna, canned 3 oz (200 mg).  [place] fries, fast food, 3 oz (470 mg).  Granola with fruit and nuts,  cup (200 mg).  Grapefruit juice,  cup (200 mg).  Greens, beet,  cup (655 mg).  Honeydew melon,  cup (200 mg).  Kale, raw, 1 cup (300 mg).  Kiwi, 1 medium (240 mg).  Kohlrabi, rutabaga, parsnips,  cup (280 mg).  Lentils,  cup (365 mg).  Mango, 1 each (325 mg).  Milk, chocolate, 1 cup (420 mg).  Milk: nonfat, low-fat, whole, buttermilk, 1 cup (350-380 mg).  Molasses, 1 Tbsp (295 mg).  Mushrooms,  cup (280) mg.  Nectarine, 1 each (275 mg).  Nuts: almonds, peanuts, hazelnuts, [place], cashew, mixed, 1 oz (200 mg).  Nuts, pistachios, 1 oz (295 mg).  Orange, 1 each (240 mg).  Orange juice,  cup (235 mg).  Papaya, medium,  fruit (390 mg).  Peanut butter, chunky, 2 Tbsp (240 mg).  Peanut butter, smooth, 2 Tbsp (210 mg).  Pear, 1 medium (200 mg).  Pomegranate, 1 whole (400 mg).  Pomegranate juice,  cup (215 mg).  Pork,  3 oz (350 mg).  Potato chips, salted, 1 oz (465 mg).  Potato, baked with skin, 1 medium (925 mg).  Potatoes, boiled,  cup (255 mg).  Potatoes, mashed,  cup (330 mg).  Prune juice,  cup (370 mg).  Prunes, 5 each (305 mg).  Pudding, chocolate,  cup (230 mg).  Pumpkin, canned,  cup (250 mg).  Raisins, seedless,  cup (270 mg).  Seeds, sunflower or pumpkin, 1 oz (240 mg).  Soy milk, 1 cup (300 mg).  Spinach,  cup (420 mg).  Spinach, canned,  cup (370 mg).  Sweet potato, baked with skin, 1 medium (450 mg).  Swiss chard,  cup (480 mg).  Tomato or vegetable juice,  cup (275 mg).  Tomato sauce or puree,  cup (400-550 mg).  Tomato, raw, 1 medium (290 mg).  Tomatoes, canned,  cup (200-300 mg).  [place], 3 oz (250 mg).  Wheat germ, 1 oz (250 mg).  Winter squash,  cup (250  mg).  Yogurt, plain or fruited, 6 oz (260-435 mg).  Zucchini,  cup (220 mg). MODERATE IN POTASSIUM The following foods and beverages have 50-200 mg of potassium per serving:  Apple, 1 each (150 mg).  Apple juice,  cup (150 mg).  Applesauce,  cup (90 mg).  Apricot nectar,  cup (140 mg).  Asparagus, small spears,  cup or 6 spears (155 mg).  Bagel, cinnamon raisin, 1 each (130 mg).  Bagel, egg or plain, 4 in., 1 each (70 mg).  Beans, green,  cup (90 mg).  Beans, yellow,  cup (190 mg).  Beer, regular, 12 oz (100 mg).  Beets, canned,  cup (125 mg).  Blackberries,  cup (115 mg).  Blueberries,  cup (60 mg).  Bread, whole wheat, 1 slice (70 mg).  Broccoli, raw,  cup (145 mg).  Cabbage,  cup (150 mg).  Carrots, cooked or raw,  cup (180 mg).  Cauliflower, raw,  cup (150 mg).  Celery, raw,  cup (155 mg).  Cereal, bran flakes, cup (120-150 mg).  Cheese, cottage,  cup (110 mg).  Cherries, 10 each (150 mg).  Chocolate, 1 oz bar (165 mg).  Coffee, brewed 6 oz (90 mg).  Corn,  cup or 1 ear (195 mg).  Cucumbers,  cup (80 mg).  Egg, large, 1 each (60 mg).  Eggplant,  cup (60 mg).  Endive, raw, cup (80 mg).  English muffin, 1 each (65 mg).  Fish, orange roughy, 3 oz (150 mg).  Frankfurter, beef or pork, 1 each (75 mg).  Fruit cocktail,  cup (115 mg).  Grape juice,  cup (170 mg).  Grapefruit,  fruit (175 mg).  Grapes,  cup (155 mg).  Greens: kale, turnip, collard,  cup (110-150 mg).  Ice cream or frozen yogurt, chocolate,  cup (175 mg).  Ice cream or frozen yogurt, vanilla,  cup (120-150 mg).  Lemons, limes, 1 each (80 mg).  Lettuce, all types, 1 cup (100 mg).  Mixed vegetables,  cup (150 mg).  Mushrooms, raw,  cup (110 mg).  Nuts: walnuts, pecans, or macadamia, 1 oz (125 mg).  Oatmeal,  cup (80 mg).  Okra,  cup (110 mg).  Onions, raw,  cup (120 mg).  Peach, 1 each (185 mg).  Peaches, canned,  cup  (120 mg).  Pears, canned,  cup (120 mg).  Peas, green, frozen,  cup (90 mg).  Peppers, green,  cup (130 mg).  Peppers, red,  cup (160 mg).  Pineapple juice,  cup (165 mg).  Pineapple, fresh or canned,  cup (100 mg).  Plums, 1 each (105 mg).  Pudding, vanilla,  cup (150 mg).  Raspberries,  cup (90 mg).  Rhubarb,  cup (115 mg).  Rice, wild,  cup (80 mg).  Shrimp, 3 oz (155 mg).  Spinach, raw, 1 cup (170 mg).  Strawberries,  cup (125 mg).  Summer squash  cup (175-200 mg).  Swiss chard, raw, 1 cup (135 mg).  Tangerines, 1 each (140 mg).  Tea, brewed, 6 oz (65 mg).  Turnips,  cup (140 mg).  Watermelon,  cup (85 mg).  Wine, red, table, 5 oz (180 mg).  Wine, white, table, 5 oz (100 mg). LOW IN POTASSIUM The following foods and beverages have less than 50 mg of potassium per serving.  Bread, white, 1 slice (30 mg).  Carbonated beverages, 12 oz (less than 5 mg).  Cheese, 1 oz (20-30 mg).  Cranberries,  cup (45 mg).  Cranberry juice cocktail,  cup (20 mg).  Fats and oils, 1 Tbsp (less than 5 mg).  Hummus, 1 Tbsp (32 mg).  Nectar: papaya, mango, or pear,  cup (35 mg).  Rice, white or brown,  cup (50 mg).  Spaghetti or macaroni,  cup cooked (30 mg).  Tortilla, flour or corn, 1 each (50 mg).  Waffle, 4 in., 1 each (50 mg).  Water chestnuts,  cup (40 mg). Document Released: 11/17/2004 Document Revised: 04/10/2013 Document Reviewed: 03/02/2013 [facility] Patient Information 2015 [place], [place]. This information is not intended to replace advice given to you by your health care provider. Make sure you discuss any questions you have with your health care provider.

## 2014-05-20 NOTE — ED Notes (Signed)
Pt reports he wants to kill the staff at [facility]  Because "I can't take it anymore, they treat me bad".  He denies SI.  Denies pain.

## 2014-05-20 NOTE — ED Notes (Signed)
Bed: [facility] Expected date:  Expected time:  Means of arrival:  Comments: Hold for triage 

## 2014-05-20 NOTE — ED Notes (Addendum)
[place]. [facility] offered to pay for cab back to facility. Pt refused cab and left prior to signing. Informed charge nurse, who said to let pt go. I let triage know plan in case pt returns and has transportation needs.

## 2014-05-20 NOTE — [facility] Counselor (Signed)
Clinician talked to Wells Guiles at [facility] and she stated that pt called the EMS on himself and that he has done this a few times this week. [facility] is willing to take pt back but does not have transportation at this time. Social Worker will work on getting transportation for discharge. Contact number [phone]. Wells Guiles stated that he has an ACTT team with [facility] this number is [phone].   Bedelia Person, M.S., LPCA, [facility] Licensed Professional Counselor Associate  Triage Specialist  [facility]  Therapeutic Triage Services Phone: [phone] Fax: [phone]
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
## 2014-05-20 NOTE — ED Notes (Addendum)
Patient manipulates by saying he is homicidal so he can get out of group home and come to ED for other placement-TTS notified of patient's behavior and will assess

## 2014-05-20 NOTE — ED Notes (Signed)
Patient

## 2014-05-21 NOTE — Progress Notes (Signed)
CSW was notified by charge nurse that patient was in [facility] waiting room and has officially been discharged almost 4 hours ago.  CSW went to speak with patient regarding the discharge plan for him to return to [facility]. Patient states that he will not return there. CSW offered to have pt transported by [facility]. However, patient refused. Patient ran outside during conversation with CSW and stood by exit and began to smoke tobacco pipe. CSW and Animal nutritionist tried to get patient to come and wait in the lobby. However, patient refused and wanted to stay outside.  CSW and PA went and spoke with patient. Patient insisted that he will not return to [facility]. Patient stated that his dad would be picking him up.  CSW consulted with [employer] who states that patient left [facility] with his father.  CSW called guardian multiple times. However, she did not answer the phone. CSW called guardian and left VM making her aware that patient was picked up by dad.  CSW also made assistant director aware that patient has left with dad.  CSW called [facility]. However, they did not answer the phone.   Willette Brace [phone] ED CSW 05/21/2014 12:18 AM

## 2014-06-17 DIAGNOSIS — C649 Malignant neoplasm of unspecified kidney, except renal pelvis: Secondary | ICD-10-CM | POA: Insufficient documentation
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
## 2014-08-10 NOTE — Consult Note (Signed)
Brief Consult Note: Diagnosis: bipolar disorder.   Patient was seen by consultant.   Consult note dictated.   Orders entered.   Discussed with Attending MD.   Comments: Psychiatry: PAtient seen. Chart reviewed and case discussed. currently denies any suicidal ideas or psychotic symptoms and presents at his baseline. Agrees to return to group home. This is a typical behavior pattern for this patient. Does not appear to need inpatient psych treatment. Will dc ivc and tteam will call group home.  Electronic Signatures: Gonzella Lex (MD)  (Signed 11-Aug-15 11:12)  Authored: Brief Consult Note   Last Updated: 11-Aug-15 11:12 by Gonzella Lex (MD)
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
## 2014-08-11 NOTE — H&P (Signed)
PATIENT NAME:  Ryan Zuniga, VANZANTEN MR#:  [id number] DATE OF BIRTH:  26-Jan-1981  DATE OF ADMISSION:  09/30/2011  REFERRING PHYSICIAN: Dr. Ponciano Ort ADMITTING PHYSICIAN: Dr. Cephus Shelling  REASON FOR ADMISSION: Suicidal and homicidal thoughts.   IDENTIFYING INFORMATION: Mr. Ryan Zuniga is a 34 year old Caucasian male currently living at [facility] group home for the past two days. He states that he is followed by Dr. Rosine Door for psychotropic medication management and has a prior diagnosis of schizoaffective disorder, bipolar type and mild mental retardation. His legal guardian is Starr Lake of [place].   HISTORY OF PRESENT ILLNESS: Ryan Zuniga is a 34 year old currently separated Caucasian male with a prior diagnosis of schizoaffective disorder, bipolar type and mild mental retardation as well as multiple prior inpatient psychiatric hospitalizations who came to the Emergency Room after endorsing both suicidal and homicidal thoughts. Patient states that he feels like he needs his medications to be adjusted and wants to be hospitalized. He says if he does not get help then he will kill himself or someone else. He does report having racing thoughts and feeling irritable and on edge over the past 3 to 4 days. He just moved from [place] in a group home there to a group home in the [place] area. The patient is not wanting to live in group homes any longer and wants to live in an assisted living facility or with his mother. He does report feeling depressed and hopeless and says that the group homes have "too much drama in them". He denies any crying spells or difficulty with focus and concentration. Patient does report that he has been sleeping okay but appetite is decreased he is only eating two meals per day. He denies any specific plan but says that he will kill himself if he does not get help. He says that he will "stab anyone" if he does not get help. He denies any  psychotic symptoms including auditory or visual hallucinations but does have a history of psychosis when younger. He denies any paranoid thoughts or delusions. He denies any recent illicit drug use or alcohol use and toxicology screen was negative in the ER but he has struggled with substance abuse in the past. The patient was scheduled to get his [place] injection yesterday, 06/12, of 156 mg and says his last injection was given to him late and was overdue. The patient denies any specific event at the group home or any specific persons there that upset him prior to coming to the hospital. The patient does have a legal guardian, Starr Lake, from [place].   PAST PSYCHIATRIC HISTORY: Patient has had multiple prior suicide attempts including swallowing antifreeze and has been hospitalized multiple times in the past at [facility] and at [facility]. His last hospitalization at [facility] was in October 2012. He is followed by Dr. Rosine Door and his last appointment with Dr. Rosine Door was 03/09/2011. The group home missed his last appointment with Dr. Rosine Door this spring so he has not been seen there since November. He is currently on a combination of Depakote, Zoloft, Kirt Boys, trazodone and p.r.n. Risperdal. The patient himself could not recognize any past psychotropic medication trials.  SUBSTANCE ABUSE HISTORY: There is a history of alcohol, cocaine and cannabis abuse in the past but the patient denies any use of those substances in the past one year. Toxicology screen in the Emergency Room was negative for all substances. He denies any history of any opioid or prescription narcotic abuse. He denies  any benzodiazepine abuse. He does smoke three packs of cigarettes per day and has been smoking since the age of 57.   FAMILY PSYCHIATRIC HISTORY: The patient's brother struggles with an "unspecified emotional disorder" per Dr. Pecola Leisure records. He has an uncle that committed suicide.   PAST MEDICAL HISTORY: Patient  reports history of surgery on his nose and history of lower back surgery. He reports one seizure as a child but cannot describe the seizure. He denies being on any medication specifically for seizures and believes the Depakote is for his mood. He denies any history of any TBI.   OUTPATIENT MEDICATIONS:  1. Depakote 500 mg p.o. t.i.d. 2. Zoloft 200 mg p.o. daily. 3. Lorayne Bender Sustenna 156 mg IM monthly with the last injection to be given on 06/12 although the patient did not receive that injection.  4. Risperdal 2 mg p.o. every six hours p.r.n.  5. Trazodone 50 mg p.o. nightly.   ALLERGIES: Codeine, aspirin and penicillin.   SOCIAL HISTORY: Patient was born and raised in [place] by both his biological parents but he says his parents separated when he was young. He lived with his mother after that. He denies any history of any physical or sexual abuse. He finished high school but was in special ed classes and has a diagnosis of mild mental retardation. The patient used to work in [employer] in the city of [place] in the past. He was married for five years but is currently separated and says his wife lives in [place]. He does not have any children. He is on disability and has been living in group homes for several years now. Patient also has a legal guardian, which may be from [facility]. DSS legal history: The patient has a history, Starr Lake from [place].  LEGAL HISTORY: Patient has a history of a cannabis change in the past. No pending charges.   MENTAL STATUS EXAM: Ryan Zuniga is a 34 year old Caucasian male who is wearing burgundy scrub pants and a lime green shirt. Patient did have a speech impediment and at times speech was difficult to understand. Mood was described as being "not good" and affect was somewhat anxious and irritable. Thought processes were logical and goal-directed although the patient gave brief responses. He did endorse suicidal thoughts but denied  any specific plan. He did endorse homicidal thoughts and said that he wanted to stab someone with a knife. Homicidal thoughts are generalized and he denied any specific desire to harm anyone. He was able to contract for safety inside of the hospital. He denied any current auditory or visual hallucinations. No paranoid thoughts or delusions. Attention and concentration were fair. Patient gave the month as June and year as 2013. He named the presidents as Freight forwarder, Doctor, general practice and then [employer]. He had difficulty with serial sevens but could do simple calculations without problem. He was unable to spell world backwards correctly. Abstraction was concrete.   SUICIDE RISK ASSESSMENT: At this time Mr. Fullilove is endorsing suicidal and homicidal thoughts and therefore remains at a moderately elevated risk of harm to self and others. He has a history of severe suicide attempt in the past by drinking antifreeze. He denies any access to guns. He is willing to come into the hospital and accept treatment.    REVIEW OF SYSTEMS: CONSTITUTIONAL: He denies any weakness, fatigue or weight changes. He denies any fever, chills, or night sweats. HEAD: He denies headaches or dizziness. EYES: He denies any diplopia or  blurred vision. ENT: He denies any hearing loss. He denies any neck pain or throat pain. RESPIRATORY: He denies any shortness breath or cough. CARDIOVASCULAR: He denies any chest pain, orthopnea. GASTROINTESTINAL: He denies any nausea, vomiting, or abdominal pain. He denies any change in bowel movements. GENITOURINARY: He denies incontinence or problems with frequency of urine. ENDOCRINE: He denies any heat or cold intolerance. LYMPHATIC: He denies any anemia or easy bruising. MUSCULOSKELETAL: He denies any muscle or joint pain. NEUROLOGIC: He denies any tingling or weakness. PSYCHIATRIC: Please see history of present illness.   PHYSICAL EXAMINATION:  VITAL SIGNS: Blood pressure 116/75, heart rate 72, respirations 16,  temperature 96.7, pulse oximetry 97% on room air.   HEENT: Normocephalic, atraumatic. Pupils equal, round and reactive to light and accommodation. Extraocular movements intact. Oral mucosa was moist. No lesions noted.   NECK: Supple. No cervical lymphadenopathy or thyromegaly present.   LUNGS: Clear to auscultation bilaterally. No crackles, rales, rhonchi.   CARDIAC: S1, S2, present. Regular rate and rhythm. No murmurs, rubs, or gallops.   ABDOMEN: Soft and normoactive bowel sounds present in all four quadrants. No tenderness noted. No masses noted.   EXTREMITIES: +2 pedal pulses bilaterally. No rashes, clubbing, or edema.   NEUROLOGIC: Cranial nerves II through XII are grossly intact. Gait was normal and steady. Sensation intact.   LABORATORY, DIAGNOSTIC AND RADIOLOGICAL DATA: BMP within normal limits. LFTs within normal limits. TSH within normal limits. CBC within normal limits. Urine tox screen negative for all substances. Ethanol less than 36. Glucose 87. Urinalysis was nitrate and leukocyte esterase negative with 1 WBC and no bacteria. Acetaminophen and salicylate level were pending.   DIAGNOSES:  AXIS I:  1. Schizoaffective disorder, bipolar type.  2. 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   AXIS II: Mild mental retardation.   AXIS III: History of surgery on his nose.   AXIS IV: Severe-Inability to care for self independently, history of noncompliance with medications, poor coping skills.   AXIS V: GAF at present equals 25.    ASSESSMENT AND TREATMENT RECOMMENDATIONS: Mr. Zappia is a 34 year old currently separated Caucasian male with history of mental retardation and schizoaffective disorder. He has failed multiple group home placements in the past. He presented to the Emergency Room with both suicidal and homicidal thoughts. He has only been in his new group home for the past two days. Valproic acid level in the Emergency Room was 51 and it is  unclear whether or not the patient has been fully compliant with Depakote. Will admit to inpatient psychiatry for medication management, safety, and stabilization and place on suicide precautions and close observation.  1. Schizoaffective disorder, bipolar type. Will plan to restart Depakote 500 mg p.o. t.i.d. for mood stabilization. Valproic acid level was 51 at admission. Will need to recheck valproic acid level and LFTs prior to discharge. Will plan to restart Zoloft 200 mg p.o. daily for depression and trazodone 50 mg p.o. nightly for insomnia. Will also give [place] injection of 156 mg that is given every four weeks on 06/13, today. He missed his last injection. Will check lipid panel, E45 and folic acid in a.m. Will also check EKG to rule out QTc prolongation. 2. The patient's legal guardian is aware of his admission to [facility].  3. Disposition: Will continue to work with the legal guardian to determine whether or not the patient should return to his prior group home living situation or not. Patient was placed under involuntary  commitment by the ER physician. Risks, benefits and alternatives to treatment were discussed with the patient. He actually was agreeable to coming down to the inpatient psychiatry unit.   ____________________________ Steva Colder. Nicolasa Ducking, MD akk:cms D: 09/30/2011 10:32:36 ET T: 09/30/2011 10:59:45 ET JOB#: [id number]  cc: Malaak Stach K. Nicolasa Ducking, MD, <Dictator> Chauncey Mann MD ELECTRONICALLY SIGNED 10/02/2011 9:59
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
## 2015-03-06 DIAGNOSIS — F1010 Alcohol abuse, uncomplicated: Secondary | ICD-10-CM | POA: Insufficient documentation

## 2015-03-25 ENCOUNTER — Encounter: Payer: Self-pay | Admitting: *Deleted

## 2015-03-25 ENCOUNTER — Emergency Department
Admission: EM | Admit: 2015-03-25 | Discharge: 2015-03-26 | Disposition: A | Discharge status: Other Acute Inpatient Hospital | Payer: Medicaid Other | Attending: Emergency Medicine | Admitting: Emergency Medicine

## 2015-03-25 DIAGNOSIS — F17200 Nicotine dependence, unspecified, uncomplicated: Secondary | ICD-10-CM | POA: Diagnosis not present

## 2015-03-25 DIAGNOSIS — R45851 Suicidal ideations: Secondary | ICD-10-CM | POA: Diagnosis not present

## 2015-03-25 DIAGNOSIS — Z880 Allergy status to penicillin: Secondary | ICD-10-CM | POA: Diagnosis not present

## 2015-03-25 DIAGNOSIS — F809 Developmental disorder of speech and language, unspecified: Secondary | ICD-10-CM

## 2015-03-25 DIAGNOSIS — Z79899 Other long term (current) drug therapy: Secondary | ICD-10-CM | POA: Diagnosis not present

## 2015-03-25 DIAGNOSIS — F849 Pervasive developmental disorder, unspecified: Secondary | ICD-10-CM

## 2015-03-25 DIAGNOSIS — F603 Borderline personality disorder: Secondary | ICD-10-CM

## 2015-03-25 DIAGNOSIS — F259 Schizoaffective disorder, unspecified: Secondary | ICD-10-CM | POA: Insufficient documentation

## 2015-03-25 DIAGNOSIS — Z046 Encounter for general psychiatric examination, requested by authority: Secondary | ICD-10-CM | POA: Diagnosis present

## 2015-03-25 LAB — URINE DRUG SCREEN, QUALITATIVE (ARMC ONLY)
Amphetamines, Ur Screen: NOT DETECTED
BARBITURATES, UR SCREEN: NOT DETECTED
BENZODIAZEPINE, UR SCRN: NOT DETECTED
CANNABINOID 50 NG, UR ~~LOC~~: NOT DETECTED
Cocaine Metabolite,Ur ~~LOC~~: NOT DETECTED
MDMA (Ecstasy)Ur Screen: NOT DETECTED
METHADONE SCREEN, URINE: NOT DETECTED
Opiate, Ur Screen: NOT DETECTED
Phencyclidine (PCP) Ur S: NOT DETECTED
TRICYCLIC, UR SCREEN: NOT DETECTED

## 2015-03-25 LAB — COMPREHENSIVE METABOLIC PANEL
ALT: 16 U/L — AB (ref 17–63)
AST: 15 U/L (ref 15–41)
Albumin: 4.8 g/dL (ref 3.5–5.0)
Alkaline Phosphatase: 58 U/L (ref 38–126)
Anion gap: 7 (ref 5–15)
BILIRUBIN TOTAL: 0.3 mg/dL (ref 0.3–1.2)
BUN: 8 mg/dL (ref 6–20)
CO2: 21 mmol/L — ABNORMAL LOW (ref 22–32)
Calcium: 9.5 mg/dL (ref 8.9–10.3)
Chloride: 113 mmol/L — ABNORMAL HIGH (ref 101–111)
Creatinine, Ser: 0.96 mg/dL (ref 0.61–1.24)
GFR calc Af Amer: >60 mL/min (ref >60)
GLUCOSE: 105 mg/dL — AB (ref 65–99)
Potassium: 3.3 mmol/L — ABNORMAL LOW (ref 3.5–5.1)
Sodium: 141 mmol/L (ref 135–145)
TOTAL PROTEIN: 7.6 g/dL (ref 6.5–8.1)

## 2015-03-25 LAB — ETHANOL

## 2015-03-25 LAB — CBC
HCT: 41.4 % (ref 40.0–52.0)
Hemoglobin: 13.9 g/dL (ref 13.0–18.0)
MCH: 33.2 pg (ref 26.0–34.0)
MCHC: 33.5 g/dL (ref 32.0–36.0)
MCV: 99.1 fL (ref 80.0–100.0)
Platelets: 267 10*3/uL (ref 150–440)
RBC: 4.18 MIL/uL — ABNORMAL LOW (ref 4.40–5.90)
RDW: 12.8 % (ref 11.5–14.5)
WBC: 7.5 10*3/uL (ref 3.8–10.6)
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
## 2015-03-25 NOTE — ED Notes (Signed)

## 2015-03-26 DIAGNOSIS — F259 Schizoaffective disorder, unspecified: Secondary | ICD-10-CM

## 2015-03-26 DIAGNOSIS — F603 Borderline personality disorder: Secondary | ICD-10-CM

## 2015-03-26 DIAGNOSIS — F849 Pervasive developmental disorder, unspecified: Secondary | ICD-10-CM

## 2015-03-26 DIAGNOSIS — F809 Developmental disorder of speech and language, unspecified: Secondary | ICD-10-CM

## 2015-03-26 MED ORDER — DIVALPROEX SODIUM 500 MG PO DR TAB
500.0000 mg | DELAYED_RELEASE_TABLET | Freq: Three times a day (TID) | ORAL | Status: DC
  Administered 2015-03-26: 500 mg via ORAL
  Filled 2015-03-26: qty 1

## 2015-03-26 MED ORDER — MIRTAZAPINE 15 MG PO TABS
30.0000 mg | ORAL_TABLET | Freq: Every day | ORAL | Status: DC
  Administered 2015-03-26: 30 mg via ORAL

## 2015-03-26 MED ORDER — HYDROXYZINE HCL 25 MG PO TABS: 50.0000 mg | ORAL_TABLET | Freq: Three times a day (TID) | ORAL | Status: DC | PRN

## 2015-03-26 MED ORDER — QUETIAPINE FUMARATE ER 50 MG PO TB24
100.0000 mg | ORAL_TABLET | Freq: Every day | ORAL | Status: DC
  Filled 2015-03-26: qty 2

## 2015-03-26 MED ORDER — RISPERIDONE 1 MG PO TABS
2.0000 mg | ORAL_TABLET | Freq: Every day | ORAL | Status: DC
  Administered 2015-03-26: 2 mg via ORAL

## 2015-03-26 MED ORDER — RISPERIDONE 1 MG PO TABS
ORAL_TABLET | ORAL | Status: AC
  Administered 2015-03-26: 1 mg via ORAL
  Filled 2015-03-26: qty 1

## 2015-03-26 MED ORDER — QUETIAPINE FUMARATE ER 50 MG PO TB24: 200.0000 mg | ORAL_TABLET | Freq: Every day | ORAL | Status: DC

## 2015-03-26 MED ORDER — QUETIAPINE FUMARATE ER 50 MG PO TB24
100.0000 mg | ORAL_TABLET | Freq: Every day | ORAL | Status: DC
  Administered 2015-03-26: 100 mg via ORAL

## 2015-03-26 MED ORDER — MIRTAZAPINE 15 MG PO TABS
ORAL_TABLET | ORAL | Status: AC
  Administered 2015-03-26: 30 mg via ORAL
  Filled 2015-03-26: qty 2

## 2015-03-26 MED ORDER — RISPERIDONE 1 MG PO TABS
1.0000 mg | ORAL_TABLET | Freq: Two times a day (BID) | ORAL | Status: DC
  Administered 2015-03-26 (×2): 1 mg via ORAL
  Filled 2015-03-26: qty 1

## 2015-03-26 MED ORDER — RISPERIDONE 1 MG PO TABS
ORAL_TABLET | ORAL | Status: AC
  Administered 2015-03-26: 2 mg via ORAL
  Filled 2015-03-26: qty 2

## 2015-03-26 MED ORDER — QUETIAPINE FUMARATE 25 MG PO TABS
ORAL_TABLET | ORAL | Status: AC
  Administered 2015-03-26: 100 mg
  Filled 2015-03-26: qty 4

## 2015-03-26 NOTE — ED Notes (Signed)
BEHAVIORAL HEALTH ROUNDING Patient sleeping: No. Patient alert and oriented: yes Behavior appropriate: Yes.   Nutrition and fluids offered: Yes  Toileting and hygiene offered: Yes  Sitter present: q15 min observations Law enforcement present: Yes [employer] 

## 2015-03-26 NOTE — ED Notes (Signed)
Pt. Noted in room resting quietly;. No complaints or concerns voiced. No distress or abnormal behavior noted. Will continue to monitor with security cameras. Q 15 minute rounds continue. 

## 2015-03-26 NOTE — ED Notes (Signed)
Called report to [place] Pt to move to [facility] room 6

## 2015-03-26 NOTE — ED Notes (Signed)

## 2015-03-26 NOTE — ED Notes (Signed)
Patient asleep in room. No noted distress or abnormal behavior. Will continue 15 minute checks and observation by security cameras for safety. 

## 2015-03-26 NOTE — ED Notes (Signed)
Pt in room. No complaints or concerns voiced at this time. No abnormal behavior noted at this time. Will continue to monitor with q15 min checks. [employer] officer in area. 

## 2015-03-26 NOTE — ED Notes (Signed)
Patient resting quietly in room. No noted distress or abnormal behaviors noted. Will continue 15 minute checks and observation by security camera for safety. 

## 2015-03-26 NOTE — Discharge Instructions (Signed)
Suicidal Feelings: How to Help Yourself °Suicide is the taking of one's own life. If you feel as though life is getting too tough to handle and are thinking about suicide, get help right away. To get help: °· Call your local emergency services (911 in the [place] °· Call a suicide hotline to speak with a trained counselor who understands how you are feeling. The following is a list of suicide hotlines in the [place]. For a list of hotlines in [place], visit www.suicide.org/hotlines/international/[place]-suicide-hotlines.html. °¨  1-800-273-TALK (1-800-273-8255). °¨  1-800-SUICIDE (1-800-784-2433). °¨  1-888-628-9454. This is a hotline for Spanish speakers. °¨  1-800-799-4TTY (1-800-799-4889). This is a hotline for TTY users. °¨  1-866-4-U-TREVOR (1-866-488-7386). This is a hotline for lesbian, gay, bisexual, transgender, or questioning youth. °· Contact a crisis center or a local suicide prevention center. To find a crisis center or suicide prevention center: °¨ Call your local hospital, clinic, community service organization, mental health center, social service provider, or health department. Ask for assistance in connecting to a crisis center. °¨ Visit www.suicidepreventionlifeline.org/getinvolved/locator for a list of crisis centers in the [place], or visit www.suicideprevention.ca/thinking-about-suicide/find-a-crisis-centre for a list of centers in [place]. °· Visit the following websites: °¨  National Suicide Prevention Lifeline: www.suicidepreventionlifeline.org °¨  Hopeline: www.hopeline.com °¨  American Foundation for Suicide Prevention: www.afsp.org °¨  The Trevor Project (for lesbian, gay, bisexual, transgender, or questioning youth): www.thetrevorproject.org °HOW CAN I HELP MYSELF FEEL BETTER? °· Promise yourself that you will not do anything drastic when you have suicidal feelings. Remember, there is hope. Many people have gotten through suicidal thoughts and feelings, and you will, too. You may  have gotten through them before, and this proves that you can get through them again. °· Let family, friends, teachers, or counselors know how you are feeling. Try not to isolate yourself from those who care about you. Remember, they will want to help you. Talk with someone every day, even if you do not feel sociable. Face-to-face conversation is best. °· Call a mental health professional and see one regularly. °· Visit your primary health care provider every year. °· Eat a well-balanced diet, and space your meals so you eat regularly. °· Get plenty of rest. °· Avoid alcohol and drugs, and remove them from your home. They will only make you feel worse. °· If you are thinking of taking a lot of medicine, give your medicine to someone who can give it to you one day at a time. If you are on antidepressants and are concerned you will overdose, let your health care provider know so he or she can give you safer medicines. Ask your mental health professional about the possible side effects of any medicines you are taking. °· Remove weapons, poisons, knives, and anything else that could harm you from your home. °· Try to stick to routines. Follow a schedule every day. Put self-care on your schedule. °· Make a list of realistic goals, and cross them off when you achieve them. Accomplishments give a sense of worth. °· Wait until you are feeling better before doing the things you find difficult or unpleasant. °· Exercise if you are able. You will feel better if you exercise for even a half hour each day. °· Go out in the sun or into nature. This will help you recover from depression faster. If you have a favorite place to walk, go there. °· Do the things that have always given you pleasure. Play your favorite music, read a good book, paint a picture, play your favorite instrument, or do anything   else that takes your mind off your depression if it is safe to do. °· Keep your living space well lit. °· When you are feeling well,  write yourself a letter about tips and support that you can read when you are not feeling well. °· Remember that life's difficulties can be sorted out with help. Conditions can be treated. You can work on thoughts and strategies that serve you well. °  °This information is not intended to replace advice given to you by your health care provider. Make sure you discuss any questions you have with your health care provider. °  °Document Released: 10/10/2002 Document Revised: 04/26/2014 Document Reviewed: 07/31/2013 °Elsevier Interactive Patient Education ©2016 [employer] ° °

## 2015-03-26 NOTE — ED Notes (Signed)
Patient discharged ambulatory to group home. Denies SI or HI. Discharge instructions reviewed with patient, he verbalizes understanding. Patient received copy of DC plan and all personal belongings.

## 2015-03-26 NOTE — ED Notes (Signed)
Report received from [facility] and oriented in no distress; SI; denies HI, AVH and pain.  Pt. Instructed to come to me with problems or concerns.Will continue to monitor for safety via security cameras and Q 15 minute checks.
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
## 2015-03-26 NOTE — ED Notes (Signed)
Patient meeting with psychiatrist. Currently calm and cooperative, denies SI or HI. No evidence of psychosis. Maintained on 15 minute checks and observation by security camera for safety.

## 2015-03-26 NOTE — ED Notes (Signed)
Patient aware that he will be discharged back to group home later today. Discharge instructions reviewed with patient and e-signature obtained.

## 2015-03-26 NOTE — Progress Notes (Signed)
LCSW called [facility] and let them know patient is ready for pick up. Spoke to [place] and [facility].  Called Legal Guardian Barbee Cough and let her know that patient is being discharged.Left detailed message  As per doctor [name] patient is not suicidal or homicidal and understands that he acts out when he doesn't get his way.

## 2015-03-26 NOTE — ED Notes (Addendum)
Pt. To [facility] from ED ambulatory without difficulty, to room #6 . Report from RN. Pt. Is alert and oriented, warm and dry in no distress. Pt. Verbalizes having SI; denies having HI, and AVH. Pt. Calm and cooperative.Per pt., "I didn't get my medicine tonight" ; ER-MD made aware. Pt. Made aware of security cameras and Q15 minute rounds. Pt. Encouraged to let Nursing staff know of any concerns or needs.

## 2015-03-26 NOTE — ED Provider Notes (Signed)
-----------------------------------------   12:06 PM on 03/26/2015 -----------------------------------------   Blood pressure 98/64, pulse 72, temperature 98.2 F (36.8 C), temperature source Oral, resp. rate 15, height 5\' 10"  (1.778 m), weight 160 lb (72.576 kg), SpO2 99 %.  The patient had no acute events since last update.  Calm and cooperative at this time.  Patient has been evaluated by psychiatry included for discharge to home. Patient is not suicidal or homicidal at this time. We'll discharge the patient will follow-up with his known psychiatrist.   Orbie Pyo, MD 03/26/15 [phone]

## 2015-03-26 NOTE — ED Provider Notes (Signed)
-----------------------------------------   7:21 AM on 03/26/2015 -----------------------------------------   Blood pressure 121/95, pulse 104, temperature 98.4 F (36.9 C), temperature source Oral, resp. rate 2, height 5\' 10"  (1.778 m), weight 160 lb (72.576 kg).  The patient had no acute events since last update.  Calm and cooperative at this time.  Disposition is pending per Psychiatry/Behavioral Medicine team recommendations.     Loney Hering, MD 03/26/15 [phone]

## 2015-03-30 ENCOUNTER — Emergency Department
Admission: EM | Admit: 2015-03-30 | Discharge: 2015-03-31 | Disposition: A | Discharge status: Other Acute Inpatient Hospital | Payer: Medicaid Other | Attending: Emergency Medicine | Admitting: Emergency Medicine

## 2015-03-30 ENCOUNTER — Encounter: Payer: Self-pay | Admitting: Emergency Medicine

## 2015-03-30 DIAGNOSIS — Z880 Allergy status to penicillin: Secondary | ICD-10-CM | POA: Insufficient documentation

## 2015-03-30 DIAGNOSIS — F603 Borderline personality disorder: Secondary | ICD-10-CM | POA: Diagnosis present

## 2015-03-30 DIAGNOSIS — R45850 Homicidal ideations: Secondary | ICD-10-CM | POA: Insufficient documentation

## 2015-03-30 DIAGNOSIS — F911 Conduct disorder, childhood-onset type: Secondary | ICD-10-CM | POA: Diagnosis not present

## 2015-03-30 DIAGNOSIS — F259 Schizoaffective disorder, unspecified: Secondary | ICD-10-CM | POA: Insufficient documentation

## 2015-03-30 DIAGNOSIS — F17210 Nicotine dependence, cigarettes, uncomplicated: Secondary | ICD-10-CM | POA: Insufficient documentation

## 2015-03-30 DIAGNOSIS — F849 Pervasive developmental disorder, unspecified: Secondary | ICD-10-CM

## 2015-03-30 DIAGNOSIS — F99 Mental disorder, not otherwise specified: Secondary | ICD-10-CM | POA: Diagnosis present

## 2015-03-30 DIAGNOSIS — F17200 Nicotine dependence, unspecified, uncomplicated: Secondary | ICD-10-CM

## 2015-03-30 DIAGNOSIS — F809 Developmental disorder of speech and language, unspecified: Secondary | ICD-10-CM

## 2015-03-30 LAB — CBC WITH DIFFERENTIAL/PLATELET
Basophils Absolute: 0 10*3/uL (ref 0–0.1)
Basophils Relative: 0 %
EOS ABS: 0.1 10*3/uL (ref 0–0.7)
EOS PCT: 1 %
HCT: 44.4 % (ref 40.0–52.0)
Hemoglobin: 15.2 g/dL (ref 13.0–18.0)
LYMPHS ABS: 2 10*3/uL (ref 1.0–3.6)
Lymphocytes Relative: 22 %
MCH: 33.9 pg (ref 26.0–34.0)
MCHC: 34.3 g/dL (ref 32.0–36.0)
MCV: 98.9 fL (ref 80.0–100.0)
MONO ABS: 0.6 10*3/uL (ref 0.2–1.0)
Monocytes Relative: 6 %
Neutro Abs: 6.4 10*3/uL (ref 1.4–6.5)
Neutrophils Relative %: 71 %
PLATELETS: 250 10*3/uL (ref 150–440)
RBC: 4.49 MIL/uL (ref 4.40–5.90)
RDW: 12.8 % (ref 11.5–14.5)
WBC: 9.1 10*3/uL (ref 3.8–10.6)

## 2015-03-30 LAB — COMPREHENSIVE METABOLIC PANEL
ALT: 15 U/L — AB (ref 17–63)
ANION GAP: 7 (ref 5–15)
AST: 17 U/L (ref 15–41)
Albumin: 5 g/dL (ref 3.5–5.0)
Alkaline Phosphatase: 70 U/L (ref 38–126)
BUN: 8 mg/dL (ref 6–20)
CO2: 22 mmol/L (ref 22–32)
Calcium: 9.8 mg/dL (ref 8.9–10.3)
Chloride: 110 mmol/L (ref 101–111)
Creatinine, Ser: 1.21 mg/dL (ref 0.61–1.24)
GLUCOSE: 109 mg/dL — AB (ref 65–99)
POTASSIUM: 3.8 mmol/L (ref 3.5–5.1)
SODIUM: 139 mmol/L (ref 135–145)
TOTAL PROTEIN: 7.8 g/dL (ref 6.5–8.1)
Total Bilirubin: 0.4 mg/dL (ref 0.3–1.2)

## 2015-03-30 LAB — URINE DRUG SCREEN, QUALITATIVE (ARMC ONLY)
AMPHETAMINES, UR SCREEN: NOT DETECTED
Barbiturates, Ur Screen: NOT DETECTED
Benzodiazepine, Ur Scrn: NOT DETECTED
Cannabinoid 50 Ng, Ur ~~LOC~~: NOT DETECTED
Cocaine Metabolite,Ur ~~LOC~~: NOT DETECTED
MDMA (ECSTASY) UR SCREEN: NOT DETECTED
METHADONE SCREEN, URINE: NOT DETECTED
Opiate, Ur Screen: NOT DETECTED
PHENCYCLIDINE (PCP) UR S: NOT DETECTED
Tricyclic, Ur Screen: NOT DETECTED

## 2015-03-30 LAB — ETHANOL
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
## 2015-03-31 DIAGNOSIS — F259 Schizoaffective disorder, unspecified: Secondary | ICD-10-CM

## 2015-03-31 DIAGNOSIS — F17200 Nicotine dependence, unspecified, uncomplicated: Secondary | ICD-10-CM

## 2015-03-31 NOTE — BH Assessment (Signed)
Writer called Group Home Administrator Idolina Primer [place]-[phone]) and left a HIPPA Compliant Voicemail requesting a call back.

## 2015-03-31 NOTE — ED Notes (Signed)
Patient asleep in room. No noted distress or abnormal behavior. Will continue 15 minute checks and observation by security cameras for safety. 

## 2015-03-31 NOTE — ED Notes (Signed)
Patient discharged ambulatory to group home. Denies SI or HI. Discharge instructions reviewed with patient and caregiver, they verbalize understanding. Patient received copy of DC plan and all personal belongings.

## 2015-03-31 NOTE — Progress Notes (Signed)
LCSW called [facility]: [phone]. He will pick up patient at 4pm today. LCSW supported group home provider and encouraged him to create a plan of care between the ACT team and patient to reduce his contacting the police and frequent ED visits. Day programs were discussed and [facility] - Patsy [place] was to talk to [employer] and to request additional support or to have patient transferred to higher level of care.

## 2015-03-31 NOTE — ED Notes (Signed)

## 2015-03-31 NOTE — ED Notes (Signed)
BEHAVIORAL HEALTH ROUNDING Patient sleeping: Yes.   Patient alert and oriented: not applicable SLEEPING Behavior appropriate: Yes.  ; If no, describe: SLEEPING Nutrition and fluids offered: No SLEEPING Toileting and hygiene offered: NoSLEEPING Sitter present: not applicable, Q 15 min safety rounds and observation via security camera. Law enforcement present: Yes ODS 

## 2015-03-31 NOTE — ED Notes (Signed)
Patient resting quietly in room. No noted distress or abnormal behaviors noted. Will continue 15 minute checks and observation by security camera for safety. 
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
## 2015-03-31 NOTE — ED Provider Notes (Signed)
-----------------------------------------   8:57 AM on 03/31/2015 -----------------------------------------   Blood pressure 97/60, pulse 84, temperature 97.9 F (36.6 C), temperature source Oral, resp. rate 18, height 5\' 10"  (1.778 m), weight 150 lb (68.04 kg), SpO2 97 %.  The patient had no acute events since last update.  Calm and cooperative at this time.  Disposition is pending per Psychiatry/Behavioral Medicine team recommendations.     Loney Hering, MD 03/31/15 [phone]

## 2015-03-31 NOTE — ED Provider Notes (Signed)
-----------------------------------------   2:11 PM on 03/31/2015 -----------------------------------------  Patient has been seen and evaluated by psychiatry. They believe the patient is safe for discharge home. Medically cleared, labs within normal limits. Patient will be discharged at this time.  Harvest Dark, MD 03/31/15 [phone]

## 2015-03-31 NOTE — ED Notes (Signed)
Pt brought into ED BHU via sally port and wand with metal detector for safety by ODS officer. Patient oriented to unit/care area: Pt informed of unit policies and procedures.  Informed that, for their safety, care areas are designed for safety and monitored by security cameras at all times; and visiting hours explained to patient. Patient verbalizes understanding, and verbal contract for safety obtained.Pt shown to their room.    BEHAVIORAL HEALTH ROUNDING  Patient sleeping: No.  Patient alert and oriented: yes  Behavior appropriate: Yes. ; If no, describe:  Nutrition and fluids offered: Yes  Toileting and hygiene offered: Yes  Sitter present: not applicable, Q 15 min safety rounds and observation via security camera. Law enforcement present: Yes ODS   ENVIRONMENTAL ASSESSMENT  Potentially harmful objects out of patient reach: Yes.  Personal belongings secured: Yes.  Patient dressed in hospital provided attire only: Yes.  Plastic bags out of patient reach: Yes.  Patient care equipment (cords, cables, call bells, lines, and drains) shortened, removed, or accounted for: Yes.  Equipment and supplies removed from bottom of stretcher: Yes.  Potentially toxic materials out of patient reach: Yes.  Sharps container removed or out of patient reach: Yes.

## 2015-03-31 NOTE — ED Notes (Signed)
Lunch left at bedside. Patient asleep, in no apparent distress. Maintained on all safety precautions.
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
## 2015-03-31 NOTE — Discharge Instructions (Signed)
Schizophrenia °Schizophrenia is a mental illness. It may cause disturbed or disorganized thinking, speech, or behavior. People with schizophrenia have problems functioning in one or more areas of life: work, school, home, or relationships. People with schizophrenia are at increased risk for suicide, certain chronic physical illnesses, and unhealthy behaviors, such as smoking and drug use. °People who have family members with schizophrenia are at higher risk of developing the illness. Schizophrenia affects men and women equally but usually appears at an earlier age (teenage or early adult years) in men.  °SYMPTOMS °The earliest symptoms are often subtle (prodrome) and may go unnoticed until the illness becomes more severe (first-break psychosis). Symptoms of schizophrenia may be continuous or may come and go in severity. Episodes often are triggered by major life events, such as family stress, college, military service, marriage, pregnancy or child birth, divorce, or loss of a loved one. People with schizophrenia may see, hear, or feel things that do not exist (hallucinations). They may have false beliefs in spite of obvious proof to the contrary (delusions). Sometimes speech is incoherent or behavior is odd or withdrawn.  °DIAGNOSIS °Schizophrenia is diagnosed through an assessment by your caregiver. Your caregiver will ask questions about your thoughts, behavior, mood, and ability to function in daily life. Your caregiver may ask questions about your medical history and use of alcohol or drugs, including prescription medication. Your caregiver may also order blood tests and imaging exams. Certain medical conditions and substances can cause symptoms that resemble schizophrenia. Your caregiver may refer you to a mental health specialist for evaluation. There are three major criterion for a diagnosis of schizophrenia: °· Two or more of the following five symptoms are present for a month or longer: °¨ Delusions. Often  the delusions are that you are being attacked, harassed, cheated, persecuted or conspired against (persecutory delusions). °¨ Hallucinations.   °¨ Disorganized speech that does not make sense to others. °¨ Grossly disorganized (confused or unfocused) behavior or extremely overactive or underactive motor activity (catatonia). °¨ Negative symptoms such as bland or blunted emotions (flat affect), loss of will power (avolition), and withdrawal from social contacts (social isolation). °· Level of functioning in one or more major areas of life (work, school, relationships, or self-care) is markedly below the level of functioning before the onset of illness.   °· There are continuous signs of illness (either mild symptoms or decreased level of functioning) for at least 6 months or longer. °TREATMENT  °Schizophrenia is a long-term illness. It is best controlled with continuous treatment rather than treatment only when symptoms occur. The following treatments are used to manage schizophrenia: °· Medication--Medication is the most effective and important form of treatment for schizophrenia. Antipsychotic medications are usually prescribed to help manage schizophrenia. Other types of medication may be added to relieve any symptoms that may occur despite the use of antipsychotic medications. °· Counseling or talk therapy--Individual, group, or family counseling may be helpful in providing education, support, and guidance. Many people with schizophrenia also benefit from social skills and job skills (vocational) training. °A combination of medication and counseling is best for managing the disorder over time. A procedure in which electricity is applied to the brain through the scalp (electroconvulsive therapy) may be used to treat catatonic schizophrenia or schizophrenia in people who cannot take or do not respond to medication and counseling. °  °This information is not intended to replace advice given to you by your health  care provider. Make sure you discuss any questions you have with   your health care provider. °  °Document Released: 04/02/2000 Document Revised: 04/26/2014 Document Reviewed: 06/28/2012 °Elsevier Interactive Patient Education ©2016 [employer] ° °

## 2015-04-19 ENCOUNTER — Encounter: Payer: Self-pay | Admitting: Emergency Medicine

## 2015-04-19 ENCOUNTER — Emergency Department
Admission: EM | Admit: 2015-04-19 | Discharge: 2015-04-19 | Disposition: A | Discharge status: Other Acute Inpatient Hospital | Payer: Medicaid Other | Attending: Emergency Medicine | Admitting: Emergency Medicine

## 2015-04-19 DIAGNOSIS — Z880 Allergy status to penicillin: Secondary | ICD-10-CM | POA: Diagnosis not present

## 2015-04-19 DIAGNOSIS — Z79899 Other long term (current) drug therapy: Secondary | ICD-10-CM | POA: Diagnosis not present

## 2015-04-19 DIAGNOSIS — F4325 Adjustment disorder with mixed disturbance of emotions and conduct: Secondary | ICD-10-CM | POA: Diagnosis not present

## 2015-04-19 DIAGNOSIS — F259 Schizoaffective disorder, unspecified: Secondary | ICD-10-CM | POA: Insufficient documentation

## 2015-04-19 DIAGNOSIS — F17210 Nicotine dependence, cigarettes, uncomplicated: Secondary | ICD-10-CM | POA: Diagnosis not present

## 2015-04-19 DIAGNOSIS — R45851 Suicidal ideations: Secondary | ICD-10-CM | POA: Insufficient documentation

## 2015-04-19 DIAGNOSIS — F809 Developmental disorder of speech and language, unspecified: Secondary | ICD-10-CM

## 2015-04-19 DIAGNOSIS — F849 Pervasive developmental disorder, unspecified: Secondary | ICD-10-CM

## 2015-04-19 DIAGNOSIS — Z046 Encounter for general psychiatric examination, requested by authority: Secondary | ICD-10-CM | POA: Diagnosis present

## 2015-04-19 DIAGNOSIS — F603 Borderline personality disorder: Secondary | ICD-10-CM | POA: Diagnosis present

## 2015-04-19 HISTORY — DX: Borderline personality disorder: F60.3

## 2015-04-19 HISTORY — DX: Unspecified intellectual disabilities: F79

## 2015-04-19 LAB — COMPREHENSIVE METABOLIC PANEL
ALBUMIN: 4.7 g/dL (ref 3.5–5.0)
ALT: 15 U/L — AB (ref 17–63)
AST: 17 U/L (ref 15–41)
Alkaline Phosphatase: 69 U/L (ref 38–126)
Anion gap: 9 (ref 5–15)
BUN: 10 mg/dL (ref 6–20)
CHLORIDE: 108 mmol/L (ref 101–111)
CO2: 22 mmol/L (ref 22–32)
CREATININE: 1.3 mg/dL — AB (ref 0.61–1.24)
Calcium: 9.2 mg/dL (ref 8.9–10.3)
GFR calc Af Amer: >60 mL/min (ref >60)
GFR calc non Af Amer: >60 mL/min (ref >60)
GLUCOSE: 84 mg/dL (ref 65–99)
POTASSIUM: 3.3 mmol/L — AB (ref 3.5–5.1)
Sodium: 139 mmol/L (ref 135–145)
Total Bilirubin: 0.5 mg/dL (ref 0.3–1.2)
Total Protein: 7.3 g/dL (ref 6.5–8.1)

## 2015-04-19 LAB — CBC
HEMATOCRIT: 41.2 % (ref 40.0–52.0)
HEMOGLOBIN: 14.3 g/dL (ref 13.0–18.0)
MCH: 33.8 pg (ref 26.0–34.0)
MCHC: 34.6 g/dL (ref 32.0–36.0)
MCV: 97.6 fL (ref 80.0–100.0)
PLATELETS: 228 10*3/uL (ref 150–440)
RBC: 4.22 MIL/uL — AB (ref 4.40–5.90)
RDW: 12.9 % (ref 11.5–14.5)
WBC: 10.2 10*3/uL (ref 3.8–10.6)

## 2015-04-19 LAB — ETHANOL: Alcohol, Ethyl (B): <5 mg/dL (ref <5)

## 2015-04-19 LAB — SALICYLATE LEVEL: Salicylate Lvl: <4 mg/dL (ref 2.8–30.0)

## 2015-04-19 LAB — ACETAMINOPHEN LEVEL: Acetaminophen (Tylenol), Serum: <10 ug/mL — ABNORMAL LOW (ref 10–30)

## 2015-04-19 MED ORDER — MIRTAZAPINE 15 MG PO TABS: 15.0000 mg | ORAL_TABLET | Freq: Every day | ORAL | Status: DC

## 2015-04-19 MED ORDER — RISPERIDONE 3 MG PO TABS
3.0000 mg | ORAL_TABLET | Freq: Three times a day (TID) | ORAL | Status: DC
  Administered 2015-04-19 (×2): 3 mg via ORAL
  Filled 2015-04-19 (×2): qty 1

## 2015-04-19 NOTE — ED Notes (Signed)
Nurse called [place] home to inform them of patient discharge. Nurse called 3 times and no one answered the phone. Will continue to attempt to reach group home.

## 2015-04-19 NOTE — ED Notes (Signed)
Telephone call was received from [facility] to state that, he went to the group home on [address] and spoke with someone there; who stated that, he will have someone to come in to pick up client here in the ED-BHU; and if no one shows up within the hour to contact him(Sheriff Tom) [phone]."

## 2015-04-19 NOTE — ED Notes (Signed)
Pt. Noted in room resting quietly;. No complaints or concerns voiced. No distress or abnormal behavior noted. Will continue to monitor with security cameras. Q 15 minute rounds continue. 

## 2015-04-19 NOTE — Discharge Instructions (Signed)
Schizoaffective Disorder  Schizoaffective disorder (ScAD) is a mental illness. It causes symptoms that are a mixture of schizophrenia (a psychotic disorder) and an affective (mood) disorder. The schizophrenic symptoms may include delusions, hallucinations, or odd behavior. The mood symptoms may be similar to major depression or bipolar disorder. ScAD may interfere with personal relationships or normal daily activities. People with ScAD are at increased risk for job loss, social isolation, physical health problems, anxiety and substance use disorders, and suicide.  ScAD usually occurs in cycles. Periods of severe symptoms are followed by periods of  less severe symptoms or improvement. The illness affects men and women equally but usually appears at an earlier age (teenage or early adult years) in men. People who have family members with schizophrenia, bipolar disorder, or ScAD are at higher risk of developing ScAD.  SYMPTOMS   At any one time, people with ScAD may have psychotic symptoms only or both psychotic and mood symptoms. The psychotic symptoms include one or more of the following:  · Hearing, seeing, or feeling things that are not there (hallucinations).    · Having fixed, false beliefs (delusions). The delusions usually are of being attacked, harassed, cheated, persecuted, or conspired against (paranoid delusions).  · Speaking in a way that makes no sense to others (disorganized speech).  The psychotic symptoms of ScAD may also include confusing or odd behavior or any of the negative symptoms of schizophrenia. These include loss of motivation for normal daily activities, such as bathing or grooming, withdrawal from other people, and lack of emotions.     The mood symptoms of ScAD occur more often than not. They resemble major depressive disorder or bipolar mania. Symptoms of major depression include depressed mood and four or more of the following:  · Loss of interest in usually pleasurable activities  (anhedonia).  · Sleeping more or less than normal.  · Feeling worthless or excessively guilty.  · Lack of energy or motivation.  · Trouble concentrating.  · Eating more or less than usual.  · Thinking a lot about death or suicide.  Symptoms of bipolar mania include abnormally elevated or irritable mood and increased energy or activity, plus three or more of the following:    · More confidence than normal or feeling that you are able to do anything (grandiosity).  · Feeling rested with less sleep than normal.    · Being easily distracted.    · Talking more than usual or feeling pressured to keep talking.    · Feeling that your thoughts are racing.  · Engaging in high-risk activities such as buying sprees or foolish business decisions.  DIAGNOSIS   ScAD is diagnosed through an assessment by your health care provider. Your health care provider will observe and ask questions about your thoughts, behavior, mood, and ability to function in daily life. Your health care provider may also ask questions about your medical history and use of drugs, including prescription medicines. Your health care provider may also order blood tests and imaging exams. Certain medical conditions and substances can cause symptoms that resemble ScAD. Your health care provider may refer you to a mental health specialist for evaluation.   ScAD is divided into two types. The depressive type is diagnosed if your mood symptoms are limited to major depression. The bipolar type is diagnosed if your mood symptoms are manic or a mixture of manic and depressive symptoms  TREATMENT   ScAD is usually a lifelong illness. Long-term treatment is necessary. The following treatments are available:  · Medicine. Different types of   medicine are used to treat ScAD. The exact combination depends on the type and severity of your symptoms. Antipsychotic medicine is used to control psychotic symptoms such as delusions, paranoia, and hallucinations. Mood stabilizers can  even the highs and lows of bipolar manic mood swings. Antidepressant medicines are used to treat major depressive symptoms.  · Counseling or talk therapy. Individual, group, or family counseling may be helpful in providing education, support, and guidance. Many people with ScAD also benefit from social skills and job skills (vocational) training.  A combination of medicine and counseling is usually best for managing the disorder over time. A procedure in which electricity is applied to the brain through the scalp (electroconvulsive therapy) may be used to treat people with severe manic symptoms that do not respond to medicine and counseling.  HOME CARE INSTRUCTIONS   · Take all your medicine as prescribed.  · Check with your health care provider before starting new prescription or over-the-counter medicines.  · Keep all follow up appointments with your health care provider.  SEEK MEDICAL CARE IF:   · If you are not able to take your medicines as prescribed.  · If your symptoms get worse.  SEEK IMMEDIATE MEDICAL CARE IF:   · You have serious thoughts about hurting yourself or others.     This information is not intended to replace advice given to you by your health care provider. Make sure you discuss any questions you have with your health care provider.     Document Released: 08/16/2006 Document Revised: 04/26/2014 Document Reviewed: 11/17/2012  Elsevier Interactive Patient Education ©2016 [employer]

## 2015-04-19 NOTE — ED Notes (Signed)
Patient meeting with physician. Patient shows no signs of distress and no complaints at the moment. Maintained on 15 minute checks and observation by security camera for safety.

## 2015-04-19 NOTE — ED Notes (Signed)
Nurse contacted number for group home and did not receive an answer. Nurse then contacted charge nurse who informed nurse to contact police department to try to get a response from the group home. Nurse contacted police department who stated they would go to the group home address on [address] to see if they will pick up the patient from the [place].

## 2015-04-19 NOTE — ED Notes (Signed)
Patient asleep in room. No noted distress or abnormal behavior. Will continue 15 minute checks and observation by security cameras for safety. 

## 2015-04-19 NOTE — BHH Counselor (Signed)
This Probation officer made multiple attempts to make contact with pt's group home ([facility]) and every attempt was unsuccessful. The phone number listed on IVC paperwork goes straight to an unidentifiable vmail; [phone] is no longer a working number for the group home. This Probation officer then made an attempt to contact pt's legal guardian [name] [phone]) ... This writer left vmail message for guardian to call back regarding pt's disposition.

## 2015-04-19 NOTE — ED Provider Notes (Signed)
-----------------------------------------   4:33 PM on 04/19/2015 -----------------------------------------  Case discussed with psychiatry Dr. Lissa Hoard packs in the ED after his evaluation. Feels that the patient is at his baseline with chronic behavioral problems. No acute psychiatric decompensation at this time, no threat to self or others. Medically stable with normal vital signs. We'll discharge home to follow up with outpatient care.  Carrie Mew, MD 04/19/15 [phone]

## 2015-04-19 NOTE — ED Notes (Signed)
Patient changed into hospital scrubs by this RN. Patient belonging bags sent to [facility] locker room.

## 2015-04-19 NOTE — BHH Counselor (Signed)
Writer performed [place] of group home address listed on IVC paperwork ([place]. [phone]). Search resulted in [facility] [phone]. Writer attempted to contact home for collateral information however, no one answered and there was no voicemail option available.
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
## 2015-04-19 NOTE — ED Notes (Signed)
Patient admitted to the ED under IVC after making statements that he wanted to kill group home staff and that he wished he could be run over by a car. Patient has a brother who recently died, and patient also has had strained relationships with the group home because he does not feel they are treating him well.  Patient states that he continues to feel suicidal and homicidal ideation (toward group home staff). Patient unable to contract for safety at this time. Patient states that he feels very sad because of his relationship with the group home. He presented to the unit lethargic and immediately went to bed after assessment. Maintained on 15 minute checks and observation by security camera for safety.

## 2015-04-19 NOTE — ED Notes (Signed)
Report received from Mignon Pine., RN. Pt. Alert and oriented in no distress;  SI; denies HI, AVH and pain.  Pt. Instructed to come to me with problems or concerns.Will continue to monitor for safety via security cameras and Q 15 minute checks.

## 2015-04-19 NOTE — ED Notes (Signed)
Patient currently eating dinner. Shows no signs of distress. Will continue to monitor. Maintained on 15 minute checks and observation by security camera for safety.

## 2015-04-19 NOTE — BHH Counselor (Deleted)
Per Dr. Weber Cooks, pt is to return to group home.  This Probation officer spoke directly with Webb Silversmith (Nursing Supervisor with [facility]: [phone]) who reports pt "won't be able to get back here tonight"; no transportation provided (has to be EMS or family member). She also reports pt will not be able to return to facility without being cleared by the Admissions Coordinator - Drue Novel (who is not available to the weekends).  This Probation officer informed Webb Silversmith that she would refer this information to doctor and next shift TTS.
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
## 2015-04-19 NOTE — ED Notes (Signed)
Pt resting in bed.

## 2015-04-19 NOTE — ED Notes (Signed)
Pt's group home here to pick pt up. Pt provided with clothing, plastic bags thrown away in nursing station by this rn.

## 2015-04-19 NOTE — ED Notes (Signed)
ENVIRONMENTAL ASSESSMENT Potentially harmful objects out of patient reach: Yes Personal belongings secured: Yes Patient dressed in hospital provided attire only: Yes Plastic bags out of patient reach: Yes Patient care equipment (cords, cables, call bells, lines, and drains) shortened, removed, or accounted for: Yes Equipment and supplies removed from bottom of stretcher: Yes Potentially toxic materials out of patient reach: Yes Sharps container removed or out of patient reach: Yes  Patient currently in room resting. Maintained on 15 minute checks and observation by security camera for safety.
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
## 2015-05-04 ENCOUNTER — Encounter: Payer: Self-pay | Admitting: Emergency Medicine

## 2015-05-04 ENCOUNTER — Emergency Department
Admission: EM | Admit: 2015-05-04 | Discharge: 2015-05-05 | Disposition: A | Discharge status: Home / Self Care | Payer: Medicaid Other | Attending: Emergency Medicine | Admitting: Emergency Medicine

## 2015-05-04 DIAGNOSIS — F1210 Cannabis abuse, uncomplicated: Secondary | ICD-10-CM | POA: Diagnosis not present

## 2015-05-04 DIAGNOSIS — F603 Borderline personality disorder: Secondary | ICD-10-CM | POA: Diagnosis not present

## 2015-05-04 DIAGNOSIS — F259 Schizoaffective disorder, unspecified: Secondary | ICD-10-CM | POA: Diagnosis not present

## 2015-05-04 DIAGNOSIS — F4325 Adjustment disorder with mixed disturbance of emotions and conduct: Secondary | ICD-10-CM | POA: Diagnosis not present

## 2015-05-04 DIAGNOSIS — F17210 Nicotine dependence, cigarettes, uncomplicated: Secondary | ICD-10-CM | POA: Insufficient documentation

## 2015-05-04 DIAGNOSIS — R45851 Suicidal ideations: Secondary | ICD-10-CM

## 2015-05-04 DIAGNOSIS — F849 Pervasive developmental disorder, unspecified: Secondary | ICD-10-CM

## 2015-05-04 DIAGNOSIS — F809 Developmental disorder of speech and language, unspecified: Secondary | ICD-10-CM | POA: Diagnosis not present

## 2015-05-04 DIAGNOSIS — Z880 Allergy status to penicillin: Secondary | ICD-10-CM | POA: Insufficient documentation

## 2015-05-04 DIAGNOSIS — F79 Unspecified intellectual disabilities: Secondary | ICD-10-CM | POA: Diagnosis not present

## 2015-05-04 DIAGNOSIS — F250 Schizoaffective disorder, bipolar type: Secondary | ICD-10-CM | POA: Diagnosis not present

## 2015-05-04 DIAGNOSIS — Z79899 Other long term (current) drug therapy: Secondary | ICD-10-CM | POA: Diagnosis not present

## 2015-05-04 DIAGNOSIS — Z046 Encounter for general psychiatric examination, requested by authority: Secondary | ICD-10-CM | POA: Diagnosis present

## 2015-05-04 LAB — CBC
HEMATOCRIT: 42.8 % (ref 40.0–52.0)
HEMOGLOBIN: 14.4 g/dL (ref 13.0–18.0)
MCH: 32.6 pg (ref 26.0–34.0)
MCHC: 33.8 g/dL (ref 32.0–36.0)
MCV: 96.6 fL (ref 80.0–100.0)
Platelets: 218 10*3/uL (ref 150–440)
RBC: 4.43 MIL/uL (ref 4.40–5.90)
RDW: 12.9 % (ref 11.5–14.5)
WBC: 9 10*3/uL (ref 3.8–10.6)

## 2015-05-04 LAB — COMPREHENSIVE METABOLIC PANEL
ALK PHOS: 63 U/L (ref 38–126)
ALT: 13 U/L — ABNORMAL LOW (ref 17–63)
ANION GAP: 11 (ref 5–15)
AST: 19 U/L (ref 15–41)
Albumin: 4.8 g/dL (ref 3.5–5.0)
BILIRUBIN TOTAL: 0.9 mg/dL (ref 0.3–1.2)
BUN: 6 mg/dL (ref 6–20)
CO2: 16 mmol/L — AB (ref 22–32)
CREATININE: 1.23 mg/dL (ref 0.61–1.24)
Calcium: 9.6 mg/dL (ref 8.9–10.3)
Chloride: 111 mmol/L (ref 101–111)
GFR calc Af Amer: >60 mL/min (ref >60)
Glucose, Bld: 96 mg/dL (ref 65–99)
POTASSIUM: 3.1 mmol/L — AB (ref 3.5–5.1)
Sodium: 138 mmol/L (ref 135–145)
Total Protein: 7.5 g/dL (ref 6.5–8.1)

## 2015-05-04 LAB — ETHANOL: Alcohol, Ethyl (B): <5 mg/dL (ref <5)

## 2015-05-04 LAB — URINE DRUG SCREEN, QUALITATIVE (ARMC ONLY)
Amphetamines, Ur Screen: NOT DETECTED
Barbiturates, Ur Screen: NOT DETECTED
Benzodiazepine, Ur Scrn: NOT DETECTED
CANNABINOID 50 NG, UR ~~LOC~~: POSITIVE — AB
COCAINE METABOLITE, UR ~~LOC~~: NOT DETECTED
MDMA (ECSTASY) UR SCREEN: NOT DETECTED
Methadone Scn, Ur: NOT DETECTED
OPIATE, UR SCREEN: NOT DETECTED
PHENCYCLIDINE (PCP) UR S: NOT DETECTED
Tricyclic, Ur Screen: NOT DETECTED

## 2015-05-04 LAB — ACETAMINOPHEN LEVEL: Acetaminophen (Tylenol), Serum: <10 ug/mL — ABNORMAL LOW (ref 10–30)

## 2015-05-04 LAB — SALICYLATE LEVEL: Salicylate Lvl: <4 mg/dL (ref 2.8–30.0)

## 2015-05-04 MED ORDER — RISPERIDONE 1 MG PO TABS
2.0000 mg | ORAL_TABLET | Freq: Two times a day (BID) | ORAL | Status: DC
  Administered 2015-05-04 – 2015-05-05 (×3): 2 mg via ORAL
  Filled 2015-05-04 (×3): qty 2

## 2015-05-04 MED ORDER — POTASSIUM CHLORIDE CRYS ER 20 MEQ PO TBCR
40.0000 meq | EXTENDED_RELEASE_TABLET | Freq: Once | ORAL | Status: AC
  Administered 2015-05-04: 40 meq via ORAL
  Filled 2015-05-04: qty 2

## 2015-05-04 MED ORDER — MIRTAZAPINE 15 MG PO TABS: 15.0000 mg | ORAL_TABLET | Freq: Every day | ORAL | Status: DC

## 2015-05-04 MED ORDER — RISPERIDONE 3 MG PO TABS
3.0000 mg | ORAL_TABLET | Freq: Three times a day (TID) | ORAL | Status: DC
  Administered 2015-05-04: 3 mg via ORAL
  Filled 2015-05-04: qty 1

## 2015-05-04 MED ORDER — SERTRALINE HCL 50 MG PO TABS
50.0000 mg | ORAL_TABLET | Freq: Every day | ORAL | Status: DC
  Administered 2015-05-04: 50 mg via ORAL
  Filled 2015-05-04: qty 1

## 2015-05-04 MED ORDER — TRAZODONE HCL 100 MG PO TABS
100.0000 mg | ORAL_TABLET | Freq: Every day | ORAL | Status: DC
  Administered 2015-05-04: 100 mg via ORAL
  Filled 2015-05-04: qty 1

## 2015-05-04 NOTE — ED Notes (Signed)
BEHAVIORAL HEALTH ROUNDING  Patient sleeping: No.  Patient alert and oriented: yes  Behavior appropriate: Yes. ; If no, describe:  Nutrition and fluids offered: Yes  Toileting and hygiene offered: Yes  Sitter present: not applicable, Q 15 min safety rounds and observation.  Law enforcement present: Yes ODS  

## 2015-05-04 NOTE — ED Notes (Signed)
Patient asleep in room. No noted distress or abnormal behavior. Will continue 15 minute checks and observation by security cameras for safety. 
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
## 2015-05-04 NOTE — ED Notes (Signed)
Report called to Eric, RN in ED [facility].  
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
## 2015-05-04 NOTE — ED Notes (Signed)

## 2015-05-04 NOTE — ED Notes (Signed)
Pt states he feels like "i'm gonna kill myself". Pt states he plans to jump in front of a car. Pt states has not been taking his pyschiatric medication. Pt is voluntary with [place] pd.

## 2015-05-04 NOTE — ED Notes (Signed)
Patient continues to endorse suicidal ideation and states that he just feels sad and depressed and can't figure out why he continues to have these thoughts. Denies HI/AVH and pain. Patient is cooperative with care. Maintained on 15 minute checks and observation by security camera for safety.

## 2015-05-04 NOTE — ED Notes (Signed)
Patient compliant with new medication titration. Patient is calm and has no complaints at this time. Maintained on 15 minute checks and observation by security camera for safety.
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
## 2015-05-04 NOTE — ED Notes (Signed)
Patient awake eating dinner tray. No signs of distress noted. Maintained on 15 minute checks and observation by security camera for safety.

## 2015-05-04 NOTE — ED Notes (Signed)
ENVIRONMENTAL ASSESSMENT Potentially harmful objects out of patient reach: Yes Personal belongings secured: Yes Patient dressed in hospital provided attire only: Yes Plastic bags out of patient reach: Yes Patient care equipment (cords, cables, call bells, lines, and drains) shortened, removed, or accounted for: Yes Equipment and supplies removed from bottom of stretcher: Yes Potentially toxic materials out of patient reach: Yes Sharps container removed or out of patient reach: Yes  Patient currently sleeping. No signs of distress noted. Maintained on 15 minute checks and observation by security camera for safety.

## 2015-05-05 DIAGNOSIS — F809 Developmental disorder of speech and language, unspecified: Secondary | ICD-10-CM | POA: Diagnosis not present

## 2015-05-05 DIAGNOSIS — F79 Unspecified intellectual disabilities: Secondary | ICD-10-CM
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
## 2015-05-05 NOTE — Discharge Instructions (Signed)
Suicidal Feelings: How to Help Yourself °Suicide is the taking of one's own life. If you feel as though life is getting too tough to handle and are thinking about suicide, get help right away. To get help: °· Call your local emergency services (911 in the [place] °· Call a suicide hotline to speak with a trained counselor who understands how you are feeling. The following is a list of suicide hotlines in the [place]. For a list of hotlines in [place], visit www.suicide.org/hotlines/international/[place]-suicide-hotlines.html. °¨  1-800-273-TALK (1-800-273-8255). °¨  1-800-SUICIDE (1-800-784-2433). °¨  1-888-628-9454. This is a hotline for Spanish speakers. °¨  1-800-799-4TTY (1-800-799-4889). This is a hotline for TTY users. °¨  1-866-4-U-TREVOR (1-866-488-7386). This is a hotline for lesbian, gay, bisexual, transgender, or questioning youth. °· Contact a crisis center or a local suicide prevention center. To find a crisis center or suicide prevention center: °¨ Call your local hospital, clinic, community service organization, mental health center, social service provider, or health department. Ask for assistance in connecting to a crisis center. °¨ Visit www.suicidepreventionlifeline.org/getinvolved/locator for a list of crisis centers in the [place], or visit www.suicideprevention.ca/thinking-about-suicide/find-a-crisis-centre for a list of centers in [place]. °· Visit the following websites: °¨  National Suicide Prevention Lifeline: www.suicidepreventionlifeline.org °¨  Hopeline: www.hopeline.com °¨  American Foundation for Suicide Prevention: www.afsp.org °¨  The Trevor Project (for lesbian, gay, bisexual, transgender, or questioning youth): www.thetrevorproject.org °HOW CAN I HELP MYSELF FEEL BETTER? °· Promise yourself that you will not do anything drastic when you have suicidal feelings. Remember, there is hope. Many people have gotten through suicidal thoughts and feelings, and you will, too. You may  have gotten through them before, and this proves that you can get through them again. °· Let family, friends, teachers, or counselors know how you are feeling. Try not to isolate yourself from those who care about you. Remember, they will want to help you. Talk with someone every day, even if you do not feel sociable. Face-to-face conversation is best. °· Call a mental health professional and see one regularly. °· Visit your primary health care provider every year. °· Eat a well-balanced diet, and space your meals so you eat regularly. °· Get plenty of rest. °· Avoid alcohol and drugs, and remove them from your home. They will only make you feel worse. °· If you are thinking of taking a lot of medicine, give your medicine to someone who can give it to you one day at a time. If you are on antidepressants and are concerned you will overdose, let your health care provider know so he or she can give you safer medicines. Ask your mental health professional about the possible side effects of any medicines you are taking. °· Remove weapons, poisons, knives, and anything else that could harm you from your home. °· Try to stick to routines. Follow a schedule every day. Put self-care on your schedule. °· Make a list of realistic goals, and cross them off when you achieve them. Accomplishments give a sense of worth. °· Wait until you are feeling better before doing the things you find difficult or unpleasant. °· Exercise if you are able. You will feel better if you exercise for even a half hour each day. °· Go out in the sun or into nature. This will help you recover from depression faster. If you have a favorite place to walk, go there. °· Do the things that have always given you pleasure. Play your favorite music, read a good book, paint a picture, play your favorite instrument, or do anything   else that takes your mind off your depression if it is safe to do. °· Keep your living space well lit. °· When you are feeling well,  write yourself a letter about tips and support that you can read when you are not feeling well. °· Remember that life's difficulties can be sorted out with help. Conditions can be treated. You can work on thoughts and strategies that serve you well. °  °This information is not intended to replace advice given to you by your health care provider. Make sure you discuss any questions you have with your health care provider. °  °Document Released: 10/10/2002 Document Revised: 04/26/2014 Document Reviewed: 07/31/2013 °Elsevier Interactive Patient Education ©2016 [employer] ° °

## 2015-05-05 NOTE — ED Notes (Signed)
Patient asleep in room. No noted distress or abnormal behavior. Will continue 15 minute checks and observation by security cameras for safety. 

## 2015-05-05 NOTE — ED Notes (Signed)
Patient asleep in room. No noted distress or abnormal behavior. Will continue 15 minute checks and observation by security cameras for safety. Meal left at bedside.

## 2015-05-05 NOTE — ED Notes (Signed)

## 2015-05-05 NOTE — ED Provider Notes (Signed)
-----------------------------------------   7:13 AM on 05/05/2015 -----------------------------------------   Blood pressure 119/69, pulse 80, temperature 98.1 F (36.7 C), temperature source Oral, resp. rate 18, height 5\' 9"  (1.753 m), weight 160 lb (72.576 kg), SpO2 99 %.  The patient had no acute events since last update.  Calm and cooperative at this time.  Disposition is pending per Psychiatry/Behavioral Medicine team recommendations.     Carrie Mew, MD 05/05/15 [phone]

## 2015-05-05 NOTE — ED Notes (Signed)
Patient discharged per MD order. Patient denied SI or HI. Caregiver in lobby. Patient refused to respond to him and walked off into parking lot. Caregiver refused to take discharge papers, stating that patient had a lighter and cigarettes and they should have been given to him and therefore he would not take responsibility for discharge. RN told caregiver that patient was only given clothes to wear home, and that we had only noticed a cell phone and watch in clothing pockets.  Caregiver called his manager Leonides Sake, and RN spoke with this person. Explained that patient had been evaluated by physicians and he did not meet criteria for inpatient hospitalization. RN  explained that we did not find any contraband and also apologized for any further inconvenience that this might cause staff/manager. Follow up care was discussed with Leonides Sake.

## 2015-05-05 NOTE — ED Notes (Signed)
Patient resting quietly in room. No noted distress or abnormal behaviors noted. Will continue 15 minute checks and observation by security camera for safety. 

## 2015-05-05 NOTE — ED Provider Notes (Signed)
-----------------------------------------   1:05 PM on 05/05/2015 -----------------------------------------   Blood pressure 129/70, pulse 103, temperature 98.6 F (37 C), temperature source Oral, resp. rate 20, height 5\' 9"  (1.753 m), weight 160 lb (72.576 kg), SpO2 95 %.  The patient had no acute events since last update.  Calm and cooperative at this time.  Seen by psychiatry, Dr. Weber Cooks who cleared him for discharge. The patient is denying any suicidal or homicidal ideation. He'll be discharged back to his group home.    Orbie Pyo, MD 05/05/15 [phone]

## 2015-05-05 NOTE — Progress Notes (Signed)
Spoke with Group Home staff [facility] Ryan Zuniga who says that he will send staff to pick up patient.  He will be returning to same home he was at on admission.  Can be reached at [phone].

## 2015-05-05 NOTE — NC FL2 (Signed)
  [place] LEVEL OF CARE SCREENING TOOL     IDENTIFICATION  Patient Name: Ryan Zuniga Birthdate: 1980/05/06 Sex: male Admission Date (Current Location): 05/04/2015  [place] and [place] Number:  Selena Lesser [id number] [place] and Address:  [facility], [address], [place], [place] [address]      Provider Number: [id number]  Attending Physician Name and Address:  No att. providers found  Relative Name and Phone Number:  [name] [phone]    Current Level of Care: Other (Comment) (Emergency Department) Recommended Level of Care: Other (Comment) (Group Home) Prior Approval Number:    Date Approved/Denied:   PASRR Number:    Discharge Plan: Domiciliary (Rest home)    Current Diagnoses: Patient Active Problem List   Diagnosis Date Noted  . Schizoaffective disorder, bipolar type ([place])   . 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 Suicidal ideation 04/19/2015  . Nicotine dependence 03/31/2015  . Borderline personality disorder 03/26/2015  . 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 Schizoaffective disorder ([place])   . Suicidal ideations 05/17/2014  . Schizoaffective disorder, depressive type ([place])   . Emotional stress reaction 05/10/2014  . Schizoaffective disorder, unspecified type ([place]) 05/10/2014  . Homicidal ideations 05/10/2014    Orientation RESPIRATION BLADDER Height & Weight    Self, Time, Situation, Place  Normal Continent   160 lbs.  BEHAVIORAL SYMPTOMS/MOOD NEUROLOGICAL BOWEL NUTRITION STATUS      Continent    AMBULATORY STATUS COMMUNICATION OF NEEDS Skin   Independent Verbally Normal                       Personal Care Assistance Level of Assistance  Bathing, Feeding, Dressing Bathing Assistance: Independent Feeding assistance: Independent Dressing Assistance: Independent     Functional Limitations Info   Sight, Hearing, Speech Sight Info: Adequate Hearing Info: Adequate Speech Info: Adequate    SPECIAL CARE FACTORS FREQUENCY                       Contractures Contractures Info: Not present    Additional Factors Info                  Current Medications (05/05/2015):  This is the current hospital active medication list Current Facility-Administered Medications  Medication Dose Route Frequency Provider Last Rate Last Dose  . risperiDONE (RISPERDAL) tablet 2 mg  2 mg Oral BID Rainey Pines, MD   2 mg at 05/05/15 0920  . sertraline (ZOLOFT) tablet 50 mg  50 mg Oral QHS Rainey Pines, MD   50 mg at 05/04/15 2153  . traZODone (DESYREL) tablet 100 mg  100 mg Oral QHS Rainey Pines, MD   100 mg at 05/04/15 2153   Current Outpatient Prescriptions  Medication Sig Dispense Refill  . mirtazapine (REMERON) 15 MG tablet Take 15 mg by mouth at bedtime.    . risperiDONE (RISPERDAL) 3 MG tablet Take 3 mg by mouth 3 (three) times daily.       Discharge Medications: Please see discharge summary for a list of discharge medications.  Relevant Imaging Results:  Relevant Lab Results:   Additional Information    Suvan Stcyr, Carloyn Jaeger, LCSW
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
## 2015-05-05 NOTE — ED Notes (Signed)
Meal served. Patient has remained in his room, in no apparent distress.  Maintained on 15 minute checks and observation by security camera for safety.

## 2015-05-26 ENCOUNTER — Emergency Department
Admission: EM | Admit: 2015-05-26 | Discharge: 2015-05-29 | Disposition: A | Discharge status: Other Acute Inpatient Hospital | Payer: Medicaid Other | Attending: Emergency Medicine | Admitting: Emergency Medicine

## 2015-05-26 ENCOUNTER — Encounter: Payer: Self-pay | Admitting: Emergency Medicine

## 2015-05-26 DIAGNOSIS — F17200 Nicotine dependence, unspecified, uncomplicated: Secondary | ICD-10-CM | POA: Diagnosis present

## 2015-05-26 DIAGNOSIS — F329 Major depressive disorder, single episode, unspecified: Secondary | ICD-10-CM

## 2015-05-26 DIAGNOSIS — F849 Pervasive developmental disorder, unspecified: Secondary | ICD-10-CM

## 2015-05-26 DIAGNOSIS — R451 Restlessness and agitation: Secondary | ICD-10-CM | POA: Diagnosis present

## 2015-05-26 DIAGNOSIS — Z880 Allergy status to penicillin: Secondary | ICD-10-CM | POA: Insufficient documentation

## 2015-05-26 DIAGNOSIS — F603 Borderline personality disorder: Secondary | ICD-10-CM | POA: Diagnosis not present

## 2015-05-26 DIAGNOSIS — F32A Depression, unspecified: Secondary | ICD-10-CM

## 2015-05-26 DIAGNOSIS — F809 Developmental disorder of speech and language, unspecified: Secondary | ICD-10-CM

## 2015-05-26 DIAGNOSIS — F17210 Nicotine dependence, cigarettes, uncomplicated: Secondary | ICD-10-CM | POA: Insufficient documentation

## 2015-05-26 DIAGNOSIS — Z79899 Other long term (current) drug therapy: Secondary | ICD-10-CM | POA: Diagnosis not present

## 2015-05-26 DIAGNOSIS — F250 Schizoaffective disorder, bipolar type: Secondary | ICD-10-CM | POA: Diagnosis present

## 2015-05-26 DIAGNOSIS — F3130 Bipolar disorder, current episode depressed, mild or moderate severity, unspecified: Secondary | ICD-10-CM | POA: Insufficient documentation

## 2015-05-26 LAB — COMPREHENSIVE METABOLIC PANEL
ALBUMIN: 4.9 g/dL (ref 3.5–5.0)
ALK PHOS: 72 U/L (ref 38–126)
ALT: 12 U/L — AB (ref 17–63)
ANION GAP: 11 (ref 5–15)
AST: 18 U/L (ref 15–41)
BILIRUBIN TOTAL: 0.7 mg/dL (ref 0.3–1.2)
BUN: 6 mg/dL (ref 6–20)
CALCIUM: 9.5 mg/dL (ref 8.9–10.3)
CO2: 18 mmol/L — ABNORMAL LOW (ref 22–32)
CREATININE: 0.88 mg/dL (ref 0.61–1.24)
Chloride: 105 mmol/L (ref 101–111)
GFR calc Af Amer: >60 mL/min (ref >60)
GFR calc non Af Amer: >60 mL/min (ref >60)
GLUCOSE: 89 mg/dL (ref 65–99)
Potassium: 3.4 mmol/L — ABNORMAL LOW (ref 3.5–5.1)
Sodium: 134 mmol/L — ABNORMAL LOW (ref 135–145)
TOTAL PROTEIN: 7.8 g/dL (ref 6.5–8.1)

## 2015-05-26 LAB — ACETAMINOPHEN LEVEL

## 2015-05-26 LAB — CBC WITH DIFFERENTIAL/PLATELET
BASOS PCT: 1 %
Basophils Absolute: 0 10*3/uL (ref 0–0.1)
EOS ABS: 0.2 10*3/uL (ref 0–0.7)
EOS PCT: 3 %
HCT: 46.4 % (ref 40.0–52.0)
Hemoglobin: 15.7 g/dL (ref 13.0–18.0)
Lymphocytes Relative: 24 %
Lymphs Abs: 1.6 10*3/uL (ref 1.0–3.6)
MCH: 33.2 pg (ref 26.0–34.0)
MCHC: 33.8 g/dL (ref 32.0–36.0)
MCV: 98.3 fL (ref 80.0–100.0)
MONO ABS: 0.5 10*3/uL (ref 0.2–1.0)
MONOS PCT: 8 %
Neutro Abs: 4.2 10*3/uL (ref 1.4–6.5)
Neutrophils Relative %: 64 %
Platelets: 187 10*3/uL (ref 150–440)
RBC: 4.72 MIL/uL (ref 4.40–5.90)
RDW: 12.8 % (ref 11.5–14.5)
WBC: 6.6 10*3/uL (ref 3.8–10.6)

## 2015-05-26 LAB — URINALYSIS COMPLETE WITH MICROSCOPIC (ARMC ONLY)
BACTERIA UA: NONE SEEN
Bilirubin Urine: NEGATIVE
GLUCOSE, UA: NEGATIVE mg/dL
Hgb urine dipstick: NEGATIVE
Leukocytes, UA: NEGATIVE
Nitrite: NEGATIVE
PROTEIN: NEGATIVE mg/dL
RBC / HPF: NONE SEEN RBC/hpf (ref 0–5)
SQUAMOUS EPITHELIAL / LPF: NONE SEEN
Specific Gravity, Urine: 1.004 — ABNORMAL LOW (ref 1.005–1.030)
WBC UA: NONE SEEN WBC/hpf (ref 0–5)
pH: 7 (ref 5.0–8.0)

## 2015-05-26 LAB — URINE DRUG SCREEN, QUALITATIVE (ARMC ONLY)
Amphetamines, Ur Screen: NOT DETECTED
Barbiturates, Ur Screen: NOT DETECTED
Benzodiazepine, Ur Scrn: NOT DETECTED
CANNABINOID 50 NG, UR ~~LOC~~: NOT DETECTED
Cocaine Metabolite,Ur ~~LOC~~: NOT DETECTED
MDMA (Ecstasy)Ur Screen: NOT DETECTED
Methadone Scn, Ur: NOT DETECTED
OPIATE, UR SCREEN: NOT DETECTED
PHENCYCLIDINE (PCP) UR S: NOT DETECTED
Tricyclic, Ur Screen: NOT DETECTED

## 2015-05-26 LAB — ETHANOL: Alcohol, Ethyl (B): <5 mg/dL (ref <5)

## 2015-05-26 LAB — SALICYLATE LEVEL

## 2015-05-26 NOTE — ED Notes (Signed)
2 bags to locker: LIGHTER in jacket sleeve pocket!!
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
## 2015-05-27 ENCOUNTER — Encounter: Payer: Self-pay | Admitting: Psychiatry

## 2015-05-27 DIAGNOSIS — F250 Schizoaffective disorder, bipolar type: Secondary | ICD-10-CM | POA: Diagnosis not present

## 2015-05-27 MED ORDER — ARIPIPRAZOLE ER 400 MG IM SUSR
1.0000 | INTRAMUSCULAR | Status: DC
  Administered 2015-05-27: 400 mg via INTRAMUSCULAR
  Filled 2015-05-27: qty 400

## 2015-05-27 MED ORDER — TOPIRAMATE 25 MG PO TABS
100.0000 mg | ORAL_TABLET | Freq: Two times a day (BID) | ORAL | Status: DC
  Administered 2015-05-27 – 2015-05-29 (×5): 100 mg via ORAL
  Filled 2015-05-27 (×6): qty 4

## 2015-05-27 MED ORDER — RISPERIDONE 3 MG PO TABS
6.0000 mg | ORAL_TABLET | Freq: Every day | ORAL | Status: DC
  Administered 2015-05-27 – 2015-05-28 (×2): 6 mg via ORAL
  Filled 2015-05-27 (×2): qty 2

## 2015-05-27 MED ORDER — CARBAMAZEPINE 200 MG PO TABS
300.0000 mg | ORAL_TABLET | Freq: Three times a day (TID) | ORAL | Status: DC
  Administered 2015-05-27 – 2015-05-29 (×7): 300 mg via ORAL
  Filled 2015-05-27 (×7): qty 2

## 2015-05-27 MED ORDER — RISPERIDONE 1 MG PO TABS: 3.0000 mg | ORAL_TABLET | Freq: Three times a day (TID) | ORAL | Status: DC

## 2015-05-27 NOTE — NC FL2 (Cosign Needed)
[place] LEVEL OF CARE SCREENING TOOL     IDENTIFICATION  Patient Name: Ryan Zuniga Birthdate: 09/07/1980 Sex: male Admission Date (Current Location): 05/26/2015  [place] and [place] Number:  Selena Lesser [id number] [place] and Address:  [facility], [address], [place], [place] [address]      Provider Number: [id number]  Attending Physician Name and Address:  No att. providers found  Relative Name and Phone Number:  Tonette Bihari [phone] [facility] [place]    Current Level of Care: Hospital Recommended Level of Care: Other (Comment) (Group Home) Prior Approval Number:    Date Approved/Denied:   PASRR Number:    Discharge Plan: Domiciliary (Rest home)    Current Diagnoses: Patient Active Problem List   Diagnosis Date Noted  . Schizoaffective disorder, bipolar type ([place])   . 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 Suicidal ideation 04/19/2015  . Nicotine dependence 03/31/2015  . Borderline personality disorder 03/26/2015  . 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 Schizoaffective disorder ([place])   . Suicidal ideations 05/17/2014  . Schizoaffective disorder, depressive type ([place])   . Emotional stress reaction 05/10/2014  . Schizoaffective disorder, unspecified type ([place]) 05/10/2014  . Homicidal ideations 05/10/2014    Orientation RESPIRATION BLADDER Height & Weight     Self, Time, Situation, Place  Normal Continent Weight: 170 lb (77.111 kg) Height:  5\' 9"  (175.3 cm)  BEHAVIORAL SYMPTOMS/MOOD NEUROLOGICAL BOWEL NUTRITION STATUS      Continent Diet (normal)  AMBULATORY STATUS COMMUNICATION OF NEEDS Skin   Independent Verbally Normal                       Personal Care Assistance Level of Assistance  Bathing, Feeding, Dressing, Total care Bathing Assistance: Independent Feeding assistance: Independent Dressing Assistance:  Independent Total Care Assistance: Independent   Functional Limitations Info  Sight, Hearing, Speech Sight Info: Adequate Hearing Info: Adequate Speech Info: Adequate    SPECIAL CARE FACTORS FREQUENCY                       Contractures Contractures Info: Not present    Additional Factors Info  Allergies   Allergies Info: Asparin, PenicillianSulpha antibiotics, coedine           Current Medications (05/27/2015):  This is the current hospital active medication list Current Facility-Administered Medications  Medication Dose Route Frequency Provider Last Rate Last Dose  . ARIPiprazole SUSR 400 mg  1 each Intramuscular Q30 days Harvest Dark, MD   400 mg at 05/27/15 1234  . carbamazepine (TEGRETOL) tablet 300 mg  300 mg Oral TID Harvest Dark, MD   300 mg at 05/27/15 1538  . risperiDONE (RISPERDAL) tablet 6 mg  6 mg Oral QHS Harvest Dark, MD      . topiramate (TOPAMAX) tablet 100 mg  100 mg Oral BID Harvest Dark, MD   100 mg at 05/27/15 1538   Current Outpatient Prescriptions  Medication Sig Dispense Refill  . ARIPiprazole (ABILIFY MAINTENA) 400 MG SUSR Inject 1 each into the muscle every 30 (thirty) days.    . carbamazepine (TEGRETOL) 200 MG tablet Take 300 mg by mouth 3 (three) times daily.    . risperiDONE (RISPERDAL) 3 MG tablet Take 3 mg by mouth 3 (three) times daily.    Marland Kitchen topiramate (TOPAMAX) 100 MG tablet Take 100 mg by mouth 2 (two) times daily.       Discharge  Medications: Please see discharge summary for a list of discharge medications.  Relevant Imaging Results:  Relevant Lab Results:   Additional Information patient does smoke alot  [place], [place], LCSW

## 2015-05-27 NOTE — ED Notes (Signed)
Patient asleep in room. No noted distress or abnormal behavior. Will continue 15 minute checks and observation by security cameras for safety. 

## 2015-05-27 NOTE — ED Notes (Signed)
Breakfast provided  Pt encouraged to drink  No verbalized needs or concerns at this time

## 2015-05-27 NOTE — ED Notes (Signed)
BEHAVIORAL HEALTH ROUNDING Patient sleeping: Yes.   Patient alert and oriented: eyes closed  Appears asleep Behavior appropriate: Yes.  ; If no, describe:  Nutrition and fluids offered: Yes  Toileting and hygiene offered: sleeping Sitter present: q 15 minute observations and security monitoring Law enforcement present: yes  ODS 

## 2015-05-27 NOTE — ED Notes (Signed)
Patient meeting with psychiatrist. He is agreeing to take his medications.  Maintained on 15 minute checks and observation by security camera for safety.

## 2015-05-27 NOTE — ED Notes (Signed)

## 2015-05-27 NOTE — Progress Notes (Signed)
Called legal guardian and she expressed that he is not able to return to group home at this time.  Called [place] at [facility] [phone]  Alois Cliche is patients Care coordinator [phone]  Called are care partners and will schedule a phone consult with stakeholders.   [employer] LCSW [phone]

## 2015-05-27 NOTE — ED Notes (Signed)
Patient in bed under the covers. Will not respond to any questioning.  Maintained on 15 minute checks and observation by security camera for safety.
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
## 2015-05-27 NOTE — ED Notes (Signed)
Pt states he does not feel a need to drink anything right now, will keep encouraging oral hydration.
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
## 2015-05-27 NOTE — ED Notes (Signed)

## 2015-05-27 NOTE — ED Notes (Signed)
ED BHU Reading Is the patient under IVC or is there intent for IVC: Yes.   Is the patient medically cleared: Yes.   Is there vacancy in the ED BHU: Yes.   Is the population mix appropriate for patient: Yes.   Is the patient awaiting placement in inpatient or outpatient setting: Yes.   Has the patient had a psychiatric consult:  Consult pending Survey of unit performed for contraband, proper placement and condition of furniture, tampering with fixtures in bathroom, shower, and each patient room: Yes.  ; Findings:  APPEARANCE/BEHAVIOR Calm and cooperative NEURO ASSESSMENT Orientation: oriented x3  Denies pain Hallucinations: No.None noted (Hallucinations) Speech: Normal Gait: normal RESPIRATORY ASSESSMENT Even  Unlabored respirations  CARDIOVASCULAR ASSESSMENT Pulses equal   regular rate  Skin warm and dry   GASTROINTESTINAL ASSESSMENT no GI complaint EXTREMITIES Full ROM  PLAN OF CARE Provide calm/safe environment. Vital signs assessed twice daily. ED BHU Assessment once each 12-hour shift. Collaborate with intake RN daily or as condition indicates. Assure the ED provider has rounded once each shift. Provide and encourage hygiene. Provide redirection as needed. Assess for escalating behavior; address immediately and inform ED provider.  Assess family dynamic and appropriateness for visitation as needed: Yes.  ; If necessary, describe findings:  Educate the patient/family about BHU procedures/visitation: Yes.  ; If necessary, describe findings:

## 2015-05-27 NOTE — ED Notes (Signed)
Report given to Tearah Saulsbury H  - pt to transfer to [facility]   Am meds ordered late by Md  - I have not had time to administer them -

## 2015-05-27 NOTE — ED Notes (Signed)
Patient was encouraged to take a shower. He went into shower room but never washed or brushed his teeth. When confronted, he started yelling and saying that we were "lying." He went to his room and put the blanket over his head.  Patient refused all medications. Will attempt to establish therapeutic alliance and encourage cooperation with treatment. Maintained on 15 minute checks and observation by security camera for safety.

## 2015-05-27 NOTE — ED Notes (Signed)
No am meds ordered a this time  meds to be verified  Patient observed lying in bed with eyes closed  Even, unlabored respirations observed   NAD pt appears to be sleeping  I will continue to monitor along with every 15 minute visual observations and ongoing security monitoring

## 2015-05-27 NOTE — ED Notes (Signed)
Pt is awake in bed watching TV. Pt mood is sad/depressed and his affect is flat/blunted. Pt forwards little but denies SI/HI and AVH at this time. Pt is pleasant and cooperative with staff. Writer provided food and drink and 15 minute checks are ongoing for safety.
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
## 2015-05-27 NOTE — ED Notes (Signed)
BEHAVIORAL HEALTH ROUNDING Patient sleeping: No. Patient alert and oriented: yes Behavior appropriate: Yes.  ; If no, describe:  Nutrition and fluids offered: yes Toileting and hygiene offered: Yes  Sitter present: q15 minute observations and security monitoring Law enforcement present: Yes  ODS     Pt to transfer to [facility] -

## 2015-05-27 NOTE — ED Notes (Signed)
Received a call from Marga Melnick  [phone] she is his guardian/DSS worker  Apparently Mr Overdorf had only been in this group home for 1 week - DSS reports that he is a very hard placement

## 2015-05-27 NOTE — Progress Notes (Signed)
LCSW recalled all stake holders ([place] coordinator)  LCSW called Arvil Chaco at [employer] [phone] , She will be given my message. Called and inquired about the psychological test from [facility] has been ordered and will be scheduled soon. LCSW let them know it can be done here.  LCSW completed FL2 and will consult with other community partners to support this patients discharge.  In consultation with some ED staff, nurses and psychiatrists this patient may need to be considered for [place] LCSW [phone]

## 2015-05-27 NOTE — ED Notes (Signed)
Pt observed lying in hallway bed with his eyes closed   Even, unlabored respirations observed   NAD pt appears to be sleeping  I will continue to monitor along with every 15 minute visual observations and ongoing security monitoring

## 2015-05-28 DIAGNOSIS — F250 Schizoaffective disorder, bipolar type: Secondary | ICD-10-CM | POA: Diagnosis not present

## 2015-05-28 NOTE — ED Notes (Signed)
pts mother called earlier and inquired about condition. Pt mom then spoke with social worker

## 2015-05-28 NOTE — Progress Notes (Signed)
LCSW has called patients father 2x once at 3 and again at 4pm and was unable to leave a message. Mr Ryan Zuniga [phone].  Patients mother Ryan Zuniga [phone] called again stating she does not want her son in a shelter. LCSW provided supportive counseling as mother disclosed she herself has [facility].LCSW assured her the guardian will be responsible for her son and we are working on finding him a place. She will think about other possibilities too and stated he did very well in a trailer.   Consulted with [facility] and they have not found any potential housing opportunities. Will consult tomorrow.  [employer] LCSW [phone]
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
## 2015-05-28 NOTE — Progress Notes (Signed)
LCSW spoke to Care Coordinator and was still unsuccessful in finding a suitable placement for pt. Spoke to Guardian and explained that this patient is taking up a  Acute psychiatric bed when its not required. Colletta [place] explained that [facility] test is scheduled for ID diagnosis on March 6th,2017.I  Explained that it will be unlikely that patient will be here and may be discharged to shelter. Dorris Singh was called and he is not able to take this patient. Called [facility] and no bed offers have been made to LCSW via the hub.  [facility] and they have beds available- LCSW was asked to send information and referral to their office for consideration. Face sheet, H&P and Fl2.Items faxed over  Awaiting a call back. [employer] LCSW [phone]

## 2015-05-28 NOTE — ED Notes (Signed)
Pt. Noted in room. No complaints or concerns voiced. No distress or abnormal behavior noted. Will continue to monitor with security cameras. Q 15 minute rounds continue. 

## 2015-05-28 NOTE — Progress Notes (Signed)
Phone numbers on documentation for "[facility] group home are not in service. Writer left a message with DSS social worker to contact Lexa in order to confirm discharge back to group home and arrainge transportation from [facility] ED.

## 2015-05-28 NOTE — Progress Notes (Signed)
LCSW received call from patients mother and explained that once cleared the patient may be discharged to a shelter. LCSW asked if their is any family member available to support him until his guardian finds suitable housing, she provided a number ( His Father) [phone] Perhaps his father can support him. LCSW [employer] [phone]

## 2015-05-28 NOTE — Progress Notes (Signed)
LCSW received a call back from [facility], she has no idea where patient will stay and reported he has exhausted all local resources. This worker re-iterated that if patient is discharged psychiatrically and medically cleared he will not be housed at [facility] until the community partners find a place. Care Coordinator stated he would have to go to a shelter.LCSW requested [place] to apply for HIGH-Level supports, she explained her supervisor stated NO- I encouraged her to re-apply as patient/client is unable to cope with this level of support and needs more. She stated she would try.  LCSW received a call from [place], it was explained LCSW has put out housing request for family care homes via the [place] in our EPIC system and completed an Fl2- and is awaiting responses. LCSW was told that no one would likely accept patient. LCSW did advise guardian patient may be psychiatrically cleared/medically cleared and he may be discharged. She stated that [facility] is also looking for group homes.  LCSW called Stephany at [facility]. She will call LCSW in afternoon. Awaiting call back

## 2015-05-28 NOTE — ED Notes (Signed)
Able to reach Truddie Coco by phone at "[facility]" who states that the pt will not be returning to their group home. Writer relayed updates to TTS staff member.

## 2015-05-28 NOTE — ED Notes (Signed)
Explained to pt that he could be dc once a safe plan is put in place for housing. sw  Has spoke with guardian today and family

## 2015-05-28 NOTE — NC FL2 (Cosign Needed)
  [place] LEVEL OF CARE SCREENING TOOL     IDENTIFICATION  Patient Name: Ryan Zuniga Birthdate: [birth year] Sex: male Admission Date (Current Location): 05/26/2015  [place] and [place] Number:  Selena Lesser [id number] [place] and Address:  [facility], [address] [place], [place] [address]      Provider Number: [id number]  Attending Physician Name and Address:  No att. providers found  Relative Name and Phone Number:  Tonette Bihari [phone] DSS Charles Mix    Current Level of Care: Hospital Recommended Level of Care: Other (Comment) (Group Home)ALF-[place] Prior Approval Number:    Date Approved/Denied:   PASRR Number:    Discharge Plan: Domiciliary (Rest home)    Current Diagnoses: Patient Active Problem List   Diagnosis Date Noted  . Schizoaffective disorder, bipolar type ([place])   . 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 Tobacco use disorder 03/31/2015  . Borderline personality disorder 03/26/2015  . 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    Orientation RESPIRATION BLADDER Height & Weight     Self, Time, Situation, Place  Normal Continent Weight: 170 lb (77.111 kg) Height:  5\' 9"  (175.3 cm)  BEHAVIORAL SYMPTOMS/MOOD NEUROLOGICAL BOWEL NUTRITION STATUS      Continent Diet (normal)  AMBULATORY STATUS COMMUNICATION OF NEEDS Skin   Independent Verbally Normal                       Personal Care Assistance Level of Assistance  Bathing, Feeding, Dressing, Total care Bathing Assistance: Independent Feeding assistance: Independent Dressing Assistance: Independent Total Care Assistance: Independent   Functional Limitations Info  Sight, Hearing, Speech Sight Info: Adequate Hearing Info: Adequate Speech Info: Adequate    SPECIAL CARE FACTORS FREQUENCY                       Contractures Contractures Info: Not  present    Additional Factors Info  Allergies   Allergies Info: Asparin, PenicillianSulpha antibiotics, coedine           Current Medications (05/28/2015):  This is the current hospital active medication list Current Facility-Administered Medications  Medication Dose Route Frequency Provider Last Rate Last Dose  . ARIPiprazole SUSR 400 mg  1 each Intramuscular Q30 days Harvest Dark, MD   400 mg at 05/27/15 1234  . carbamazepine (TEGRETOL) tablet 300 mg  300 mg Oral TID Harvest Dark, MD   300 mg at 05/28/15 1610  . risperiDONE (RISPERDAL) tablet 6 mg  6 mg Oral QHS Harvest Dark, MD   6 mg at 05/27/15 2120  . topiramate (TOPAMAX) tablet 100 mg  100 mg Oral BID Harvest Dark, MD   100 mg at 05/28/15 [id number]   Current Outpatient Prescriptions  Medication Sig Dispense Refill  . ARIPiprazole (ABILIFY MAINTENA) 400 MG SUSR Inject 1 each into the muscle every 30 (thirty) days.    . carbamazepine (TEGRETOL) 200 MG tablet Take 300 mg by mouth 3 (three) times daily.    . risperiDONE (RISPERDAL) 3 MG tablet Take 3 mg by mouth 3 (three) times daily.    Marland Kitchen topiramate (TOPAMAX) 100 MG tablet Take 100 mg by mouth 2 (two) times daily.       Discharge Medications: Please see discharge summary for a list of discharge medications.  Relevant Imaging Results:  Relevant Lab Results:   Additional Information patient does smoke alot  [place], [place], LCSW

## 2015-05-28 NOTE — ED Provider Notes (Signed)
-----------------------------------------   5:08 PM on 05/28/2015 -----------------------------------------   Blood pressure 115/74, pulse 95, temperature 99.1 F (37.3 C), temperature source Oral, resp. rate 20, height 5\' 9"  (1.753 m), weight 170 lb (77.111 kg), SpO2 99 %.  The patient had no acute events since last update.  Calm and cooperative at this time.  Disposition is pending per Psychiatry/Behavioral Medicine team recommendations.   Patient will be discharged per psychiatry. He has follow-up RA scheduled and stays at a group home. Pruritic patient's been cooperative here and social work has been arranged.  Daymon Larsen, MD 05/28/15 [phone]

## 2015-05-28 NOTE — ED Notes (Signed)
Pt is awake in bed this evening. Pt mood is sad and his affect is flat, although pt denies SI/HI and AVH at this time. Pt forwards little but is pleasant and cooperative with staff. Pt did request specific snacks. He is taking medications as prescribed. Writer encouraged pt to rest and 15 minute checks are ongoing for safety.

## 2015-05-28 NOTE — ED Notes (Addendum)
Pt awake and was informed of waitng for eval by psych med he was agreeable and cooperative., ate well for breakfast

## 2015-05-28 NOTE — ED Notes (Signed)
Community Education officer, museum called inquiring about disposition  She spoke with our Education officer, museum

## 2015-05-28 NOTE — ED Provider Notes (Signed)
Patients in no acute distress, remains medically stable for psychiatric disposition. Filed Vitals:   05/27/15 0944 05/27/15 1933  BP: 124/83 121/71  Pulse: 95 90  Temp: 98 F (36.7 C) 98.3 F (36.8 C)  Resp: 18 18     Earleen Newport, MD 05/28/15 [phone]

## 2015-05-28 NOTE — ED Notes (Signed)
Lunch served

## 2015-05-28 NOTE — Discharge Instructions (Signed)
Major Depressive Disorder °Major depressive disorder is a mental illness. It also may be called clinical depression or unipolar depression. Major depressive disorder usually causes feelings of sadness, hopelessness, or helplessness. Some people with this disorder do not feel particularly sad but lose interest in doing things they used to enjoy (anhedonia). Major depressive disorder also can cause physical symptoms. It can interfere with work, school, relationships, and other normal everyday activities. The disorder varies in severity but is longer lasting and more serious than the sadness we all feel from time to time in our lives. °Major depressive disorder often is triggered by stressful life events or major life changes. Examples of these triggers include divorce, loss of your job or home, a move, and the death of a family member or close friend. Sometimes this disorder occurs for no obvious reason at all. People who have family members with major depressive disorder or bipolar disorder are at higher risk for developing this disorder, with or without life stressors. Major depressive disorder can occur at any age. It may occur just once in your life (single episode major depressive disorder). It may occur multiple times (recurrent major depressive disorder). °SYMPTOMS °People with major depressive disorder have either anhedonia or depressed mood on nearly a daily basis for at least 2 weeks or longer. Symptoms of depressed mood include: °· Feelings of sadness (blue or down in the dumps) or emptiness. °· Feelings of hopelessness or helplessness. °· Tearfulness or episodes of crying (may be observed by others). °· Irritability (children and adolescents). °In addition to depressed mood or anhedonia or both, people with this disorder have at least four of the following symptoms: °· Difficulty sleeping or sleeping too much.   °· Significant change (increase or decrease) in appetite or weight.   °· Lack of energy or  motivation. °· Feelings of guilt and worthlessness.   °· Difficulty concentrating, remembering, or making decisions. °· Unusually slow movement (psychomotor retardation) or restlessness (as observed by others).   °· Recurrent wishes for death, recurrent thoughts of self-harm (suicide), or a suicide attempt. °People with major depressive disorder commonly have persistent negative thoughts about themselves, other people, and the world. People with severe major depressive disorder may experience distorted beliefs or perceptions about the world (psychotic delusions). They also may see or hear things that are not real (psychotic hallucinations). °DIAGNOSIS °Major depressive disorder is diagnosed through an assessment by your health care provider. Your health care provider will ask about aspects of your daily life, such as mood, sleep, and appetite, to see if you have the diagnostic symptoms of major depressive disorder. Your health care provider may ask about your medical history and use of alcohol or drugs, including prescription medicines. Your health care provider also may do a physical exam and blood work. This is because certain medical conditions and the use of certain substances can cause major depressive disorder-like symptoms (secondary depression). Your health care provider also may refer you to a mental health specialist for further evaluation and treatment. °TREATMENT °It is important to recognize the symptoms of major depressive disorder and seek treatment. The following treatments can be prescribed for this disorder:   °· Medicine. Antidepressant medicines usually are prescribed. Antidepressant medicines are thought to correct chemical imbalances in the brain that are commonly associated with major depressive disorder. Other types of medicine may be added if the symptoms do not respond to antidepressant medicines alone or if psychotic delusions or hallucinations occur. °· Talk therapy. Talk therapy can be  helpful in treating major depressive disorder by providing   support, education, and guidance. Certain types of talk therapy also can help with negative thinking (cognitive behavioral therapy) and with relationship issues that trigger this disorder (interpersonal therapy). A mental health specialist can help determine which treatment is best for you. Most people with major depressive disorder do well with a combination of medicine and talk therapy. Treatments involving electrical stimulation of the brain can be used in situations with extremely severe symptoms or when medicine and talk therapy do not work over time. These treatments include electroconvulsive therapy, transcranial magnetic stimulation, and vagal nerve stimulation.   This information is not intended to replace advice given to you by your health care provider. Make sure you discuss any questions you have with your health care provider.   Document Released: 07/31/2012 Document Revised: 04/26/2014 Document Reviewed: 07/31/2012 Elsevier Interactive Patient Education [employer].  Please return immediately if condition worsens. Please contact her primary physician or the physician you were given for referral. If you have any specialist physicians involved in her treatment and plan please also contact them. Thank you for using [facility] emergency [facility].

## 2015-05-28 NOTE — ED Notes (Signed)
Pt. Noted in room. No complaints or concerns voiced. No distress or abnormal behavior noted. Will continue to monitor with security cameras. Q 15 minute rounds continue.,awake watching tv

## 2015-05-29 LAB — LIPID PANEL
CHOL/HDL RATIO: 2.8 ratio
CHOLESTEROL: 150 mg/dL (ref 0–200)
HDL: 53 mg/dL (ref >40)
LDL Cholesterol: 78 mg/dL (ref 0–99)
TRIGLYCERIDES: 94 mg/dL (ref <150)
VLDL: 19 mg/dL (ref 0–40)

## 2015-05-29 LAB — CARBAMAZEPINE LEVEL, TOTAL: CARBAMAZEPINE LVL: 11.7 ug/mL (ref 4.0–12.0)

## 2015-05-29 LAB — TSH: TSH: 1.308 u[IU]/mL (ref 0.350–4.500)

## 2015-05-29 LAB — HEMOGLOBIN A1C: Hgb A1c MFr Bld: 4.5 % (ref 4.0–6.0)

## 2015-05-29 MED ORDER — TUBERCULIN PPD 5 UNIT/0.1ML ID SOLN
5.0000 [IU] | Freq: Once | INTRADERMAL | Status: DC
  Administered 2015-05-29: 5 [IU] via INTRADERMAL
  Filled 2015-05-29: qty 0.1

## 2015-05-29 NOTE — Progress Notes (Signed)
LCSW called [facility] Falcon[facility] [phone]- left message  LCSW called [facility] Arvil Chaco) [phone] unable to leave a message, after 10:30am reception will answer  [place] left a detailed message  Enis Slipper LCSW [phone]

## 2015-05-29 NOTE — Progress Notes (Signed)
Called [facility]- No male beds Called [facility] Placement coordinator [phone] Left a detailed Message awaiting a call back  Received another call from [place] exchanged efforts and information, she will attempt to reach patients father.  [place] called unsure if they are still in business, left message requesting call back.  [employer] LCSW [phone]

## 2015-05-29 NOTE — ED Notes (Signed)
Pt did take a shower

## 2015-05-29 NOTE — ED Notes (Signed)
Pt has a guradian that is not here so unable to e sign

## 2015-05-29 NOTE — Consult Note (Signed)
  Psychiatry: Follow-up for this 35 year old man with autism and behavior problems. Patient is not reporting acute suicidal ideation. He has been behaving himself reasonably well in the unit. Does not require hospital level treatment. Social work is working diligently with the patient and his representatives and has obtained a plan for him to be discharged to a new living situation today. Patient is aware of the plan and has no complaints. He is able to be discharged today.

## 2015-05-29 NOTE — Progress Notes (Signed)
LCSW consulted with Ass Director of SW- LCSW was advised to work/collaborate with guardian. As patient has been "kept" and not street savy, it would be in pts best interest to be released to a responsible person ( parent/guardian approval needed) We will not send patient by cab to a shelter. LCSW- will reconnect with guardian/LME ccc this afternoon after visits take place.  Updated director on our efforts and will keep him posted.   Shaquaya Wuellner LCSW

## 2015-05-29 NOTE — Progress Notes (Signed)
LCSW met with [facility] and she is interested but needs approval from [employer]. In discovery he was placed there short term 6 weeks ago and was then moved by [facility]. They will consult and reconsider.  Guardian and [facility] were called.  LCSW and guardian consulted and they informed this worker that [facility] may pick up patient. They apparently need assurances that patient will not act out. LCSW will make some behavioral modification suggestions to assist patient.  Patient showering now after ADL reminders. Mayerly Kaman LCSW

## 2015-05-29 NOTE — ED Notes (Addendum)
Pt. Noted in room. No complaints or concerns voiced. No distress or abnormal behavior noted. Will continue to monitor with security cameras. Q 15 minute rounds continue. 

## 2015-05-29 NOTE — ED Notes (Signed)
Pt. Noted in room. No complaints or concerns voiced. No distress or abnormal behavior noted. Will continue to monitor with security cameras. Q 15 minute rounds continue. 

## 2015-05-29 NOTE — ED Notes (Signed)
Pt. Noted in room. No complaints or concerns voiced. No distress or abnormal behavior noted. Will continue to monitor with security cameras. Q 15 minute rounds continue.on phone trying to reach Dad and already talked to mom

## 2015-05-29 NOTE — Progress Notes (Signed)
Spoke to [place] who stated she will be by between 3-4 pm to pick up[ patient. Called again to follow up got voice mail message. Awaiting call back.  Tangi Shroff LCSW

## 2015-05-29 NOTE — ED Notes (Signed)
Pt informed he will be going to new group home around 1530

## 2015-05-29 NOTE — BH Assessment (Addendum)
Received phone call from patient's DSS Guardian Zigmund Daniel) asking for Pilar Grammes phone number([phone]). Information was giving to her. Guardian was on the way to the hospital to bring his belongings.

## 2015-05-29 NOTE — ED Notes (Signed)
A group home staff is here evaluating pt for placement

## 2015-05-29 NOTE — Progress Notes (Signed)
LCSW this worker met with patient and provided some 1-1 time to review good coping skills when he becomes upset. Patient was able to identify 3 ways to deal with a situation when angry 1) make a hand gesture ( cupping one hand to the other hand) 2) count to 10 3) Walking away ( go to his safe chair) could be outside area  Patient was shown how to use a face cloth and mirrored SW washing actions. He successfully showered but will require some  ADL prompting and reminders.  In discussion with patient he will obey all house rules and understands he will not have access to his lighter and smoking ends at 10pm-no exceptions.  [place]  TB test given by RN- Guardian will bring him for follow up

## 2015-05-29 NOTE — Progress Notes (Signed)
LCSW called Ker[facility] [phone] Will fax over details and she will come by for a visit at noon. Faxed sent and will support [facility] when she comes for a visit.    Guardian informed LCSW that [facility] may have a visit and [facility] has declined patient for placement Loron Weimer LCSW

## 2015-05-29 NOTE — ED Provider Notes (Signed)
-----------------------------------------   7:00 AM on 05/29/2015 -----------------------------------------   Blood pressure 123/84, pulse 88, temperature 98.4 F (36.9 C), temperature source Oral, resp. rate 18, height 5\' 9"  (1.753 m), weight 77.111 kg, SpO2 99 %.  The patient had no acute events since last update.  As per Dr. Theodosia Paling last note, the patient is scheduled to be discharged per psychiatry.     Hinda Kehr, MD 05/29/15 0700

## 2015-05-29 NOTE — ED Notes (Signed)
Pt is being dc to a group home in [place],  Leaving with a nurse who is staff Susie Cassette

## 2015-05-29 NOTE — ED Notes (Signed)
Placement is asking for ppd

## 2015-05-29 NOTE — ED Provider Notes (Signed)
Patient being discharged. Seen and cleared by psychiatry. Patient being discharged back to group home.  Delman Kitten, MD 05/29/15 [phone]
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
## 2015-05-29 NOTE — Progress Notes (Signed)
As per guardian, Patient has been offered a bed by [facility] [phone]. He will be in [facility] group home with 4 other individuals and will followed by [facility].  LCSW left Mariann Laster a message to find out when is the patient being picked up.  [employer] LCSW [phone]

## 2015-05-29 NOTE — Progress Notes (Signed)
LCSW reviewed chart and last night shift report stated nurse called group home and guardian. In prior notes [facility] will not accept patient back. As it was left DSS worker/Guardian will attempt to increase patients support and will continue to review housing options.  [employer] LCSW [phone]

## 2015-05-30 LAB — PROLACTIN: PROLACTIN: 31.3 ng/mL — AB (ref 4.0–15.2)

## 2015-06-03 ENCOUNTER — Telehealth (HOSPITAL_COMMUNITY): Payer: Self-pay

## 2015-06-29 ENCOUNTER — Encounter: Payer: Self-pay | Admitting: Emergency Medicine

## 2015-06-29 ENCOUNTER — Emergency Department
Admission: EM | Admit: 2015-06-29 | Discharge: 2015-06-30 | Disposition: A | Discharge status: Other Acute Inpatient Hospital | Payer: Medicaid Other | Attending: Emergency Medicine | Admitting: Emergency Medicine

## 2015-06-29 DIAGNOSIS — Z79899 Other long term (current) drug therapy: Secondary | ICD-10-CM | POA: Insufficient documentation

## 2015-06-29 DIAGNOSIS — F250 Schizoaffective disorder, bipolar type: Secondary | ICD-10-CM | POA: Diagnosis not present

## 2015-06-29 DIAGNOSIS — F17210 Nicotine dependence, cigarettes, uncomplicated: Secondary | ICD-10-CM | POA: Diagnosis not present

## 2015-06-29 DIAGNOSIS — Z880 Allergy status to penicillin: Secondary | ICD-10-CM | POA: Diagnosis not present

## 2015-06-29 DIAGNOSIS — R45850 Homicidal ideations: Secondary | ICD-10-CM | POA: Diagnosis not present

## 2015-06-29 DIAGNOSIS — R451 Restlessness and agitation: Secondary | ICD-10-CM | POA: Diagnosis not present

## 2015-06-29 DIAGNOSIS — F919 Conduct disorder, unspecified: Secondary | ICD-10-CM | POA: Diagnosis present

## 2015-06-29 LAB — COMPREHENSIVE METABOLIC PANEL
ALT: 15 U/L — AB (ref 17–63)
AST: 25 U/L (ref 15–41)
Albumin: 5.2 g/dL — ABNORMAL HIGH (ref 3.5–5.0)
Alkaline Phosphatase: 86 U/L (ref 38–126)
Anion gap: 11 (ref 5–15)
CHLORIDE: 103 mmol/L (ref 101–111)
CO2: 20 mmol/L — ABNORMAL LOW (ref 22–32)
CREATININE: 1.06 mg/dL (ref 0.61–1.24)
Calcium: 9.4 mg/dL (ref 8.9–10.3)
GFR calc non Af Amer: >60 mL/min (ref >60)
GLUCOSE: 99 mg/dL (ref 65–99)
Potassium: 3.6 mmol/L (ref 3.5–5.1)
SODIUM: 134 mmol/L — AB (ref 135–145)
Total Bilirubin: 0.3 mg/dL (ref 0.3–1.2)
Total Protein: 8.1 g/dL (ref 6.5–8.1)

## 2015-06-29 LAB — URINE DRUG SCREEN, QUALITATIVE (ARMC ONLY)
AMPHETAMINES, UR SCREEN: NOT DETECTED
BARBITURATES, UR SCREEN: NOT DETECTED
Benzodiazepine, Ur Scrn: NOT DETECTED
COCAINE METABOLITE, UR ~~LOC~~: NOT DETECTED
Cannabinoid 50 Ng, Ur ~~LOC~~: NOT DETECTED
MDMA (ECSTASY) UR SCREEN: NOT DETECTED
METHADONE SCREEN, URINE: NOT DETECTED
Opiate, Ur Screen: NOT DETECTED
Phencyclidine (PCP) Ur S: NOT DETECTED
TRICYCLIC, UR SCREEN: NOT DETECTED

## 2015-06-29 LAB — CBC WITH DIFFERENTIAL/PLATELET
BASOS ABS: 0 10*3/uL (ref 0–0.1)
BASOS PCT: 0 %
EOS ABS: 0.1 10*3/uL (ref 0–0.7)
Eosinophils Relative: 1 %
HCT: 45.6 % (ref 40.0–52.0)
HEMOGLOBIN: 15.4 g/dL (ref 13.0–18.0)
Lymphocytes Relative: 23 %
Lymphs Abs: 1.5 10*3/uL (ref 1.0–3.6)
MCH: 33.6 pg (ref 26.0–34.0)
MCHC: 33.9 g/dL (ref 32.0–36.0)
MCV: 99.2 fL (ref 80.0–100.0)
Monocytes Absolute: 0.5 10*3/uL (ref 0.2–1.0)
Monocytes Relative: 8 %
Neutro Abs: 4.4 10*3/uL (ref 1.4–6.5)
Neutrophils Relative %: 68 %
Platelets: 228 10*3/uL (ref 150–440)
RBC: 4.59 MIL/uL (ref 4.40–5.90)
RDW: 12.7 % (ref 11.5–14.5)
WBC: 6.4 10*3/uL (ref 3.8–10.6)

## 2015-06-29 LAB — URINALYSIS COMPLETE WITH MICROSCOPIC (ARMC ONLY)
BACTERIA UA: NONE SEEN
BILIRUBIN URINE: NEGATIVE
Glucose, UA: NEGATIVE mg/dL
HGB URINE DIPSTICK: NEGATIVE
Ketones, ur: NEGATIVE mg/dL
LEUKOCYTES UA: NEGATIVE
NITRITE: NEGATIVE
PH: 6 (ref 5.0–8.0)
Protein, ur: NEGATIVE mg/dL
Specific Gravity, Urine: 1.006 (ref 1.005–1.030)
Squamous Epithelial / LPF: NONE SEEN

## 2015-06-29 LAB — SALICYLATE LEVEL: Salicylate Lvl: <4 mg/dL (ref 2.8–30.0)

## 2015-06-29 LAB — ACETAMINOPHEN LEVEL

## 2015-06-29 LAB — ETHANOL

## 2015-06-29 NOTE — ED Notes (Signed)
BEHAVIORAL HEALTH ROUNDING Patient sleeping: Yes.   Patient alert and oriented: no Behavior appropriate: Yes.  ; If no, describe:  Nutrition and fluids offered: No Toileting and hygiene offered: No Sitter present: No Law enforcement present: Yes

## 2015-06-29 NOTE — ED Notes (Signed)
IVC 

## 2015-06-29 NOTE — ED Notes (Signed)
ED BHU PLACEMENT JUSTIFICATION Is the patient under IVC or is there intent for IVC: Yes.   Is the patient medically cleared: Yes.   Is there vacancy in the ED BHU: Yes.   Is the population mix appropriate for patient: Yes.   Is the patient awaiting placement in inpatient or outpatient setting: Yes.   Has the patient had a psychiatric consult: No. Survey of unit performed for contraband, proper placement and condition of furniture, tampering with fixtures in bathroom, shower, and each patient room: Yes.  ; Findings:  APPEARANCE/BEHAVIOR calm, cooperative and adequate rapport can be established NEURO ASSESSMENT Orientation: time, place and person Hallucinations: No.None noted (Hallucinations) Speech: Normal Gait: normal RESPIRATORY ASSESSMENT Normal expansion.  Clear to auscultation.  No rales, rhonchi, or wheezing. CARDIOVASCULAR ASSESSMENT regular rate and rhythm, S1, S2 normal, no murmur, click, rub or gallop GASTROINTESTINAL ASSESSMENT soft, nontender, BS WNL, no r/g EXTREMITIES normal strength, tone, and muscle mass PLAN OF CARE Provide calm/safe environment. Vital signs assessed twice daily. ED BHU Assessment once each 12-hour shift. Collaborate with intake RN daily or as condition indicates. Assure the ED provider has rounded once each shift. Provide and encourage hygiene. Provide redirection as needed. Assess for escalating behavior; address immediately and inform ED provider.  Assess family dynamic and appropriateness for visitation as needed: Yes.  ; If necessary, describe findings:  Educate the patient/family about BHU procedures/visitation: Yes.  ; If necessary, describe findings:  

## 2015-06-29 NOTE — ED Notes (Signed)
Reports "not feeling right".  Brought in by [facility] voluntary
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
## 2015-06-29 NOTE — ED Notes (Signed)
Patient assigned to appropriate care area. Patient oriented to unit/care area: Informed that, for their safety, care areas are designed for safety and monitored by security cameras at all times; and visiting hours explained to patient. Patient verbalizes understanding, and verbal contract for safety obtained. 

## 2015-06-29 NOTE — ED Notes (Signed)

## 2015-06-29 NOTE — ED Notes (Signed)
Pt. Up and walked to use the bathroom with no difficulty.
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
## 2015-06-30 DIAGNOSIS — F250 Schizoaffective disorder, bipolar type: Secondary | ICD-10-CM | POA: Diagnosis not present

## 2015-06-30 MED ORDER — TOPIRAMATE 100 MG PO TABS
100.0000 mg | ORAL_TABLET | Freq: Two times a day (BID) | ORAL | Status: DC
  Administered 2015-06-30: 100 mg via ORAL
  Filled 2015-06-30 (×2): qty 1

## 2015-06-30 MED ORDER — RISPERIDONE 1 MG PO TABS
1.0000 mg | ORAL_TABLET | Freq: Three times a day (TID) | ORAL | Status: DC
  Administered 2015-06-30: 1 mg via ORAL
  Filled 2015-06-30: qty 1

## 2015-06-30 MED ORDER — CARBAMAZEPINE 200 MG PO TABS
200.0000 mg | ORAL_TABLET | Freq: Three times a day (TID) | ORAL | Status: DC
  Administered 2015-06-30: 200 mg via ORAL
  Filled 2015-06-30: qty 1

## 2015-06-30 MED ORDER — BENZTROPINE MESYLATE 0.5 MG PO TABS
0.5000 mg | ORAL_TABLET | Freq: Two times a day (BID) | ORAL | Status: DC
  Administered 2015-06-30: 0.5 mg via ORAL
  Filled 2015-06-30 (×3): qty 1

## 2015-06-30 NOTE — ED Notes (Signed)
Patient resting quietly in room. No noted distress or abnormal behaviors noted. Will continue 15 minute checks and observation by security camera for safety. 

## 2015-06-30 NOTE — Progress Notes (Signed)
LCSW called Mariann Laster and she will call [facility] to have patient transported home. Patient is discharged.  [employer] LCSW [phone]

## 2015-06-30 NOTE — ED Notes (Signed)
Pt belongings returned to pt. Pt discharged back to group home. Pt denies SI/HI and AVH.
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
## 2015-06-30 NOTE — ED Notes (Addendum)
Pt getting dressed to be discharged back to group home. Pt compliant with medications. Pt cooperative, calm. Denies SI/HI and AVH. Denies pain. Maintained on 15 minute checks and observation by security camera for safety.

## 2015-06-30 NOTE — Progress Notes (Signed)
LCSW paged Dr Gretel Acre and [facility] and informed them there would be a few medication changes. Dr Gretel Acre agreed to call [place] [phone] to discuss those changes to ensure they follow through. LCSW continues to wait for a call back from [facility] to pick up patient.  Leone Mobley LCSW

## 2015-06-30 NOTE — ED Provider Notes (Signed)
Patient cleared for discharge by Dr. Gretel Acre. IVC rescinded by Dr. Malena Edman, MD 06/30/15 1048

## 2015-06-30 NOTE — Discharge Instructions (Signed)
Schizophrenia °Schizophrenia is a mental illness. It may cause disturbed or disorganized thinking, speech, or behavior. People with schizophrenia have problems functioning in one or more areas of life: work, school, home, or relationships. People with schizophrenia are at increased risk for suicide, certain chronic physical illnesses, and unhealthy behaviors, such as smoking and drug use. °People who have family members with schizophrenia are at higher risk of developing the illness. Schizophrenia affects men and women equally but usually appears at an earlier age (teenage or early adult years) in men.  °SYMPTOMS °The earliest symptoms are often subtle (prodrome) and may go unnoticed until the illness becomes more severe (first-break psychosis). Symptoms of schizophrenia may be continuous or may come and go in severity. Episodes often are triggered by major life events, such as family stress, college, military service, marriage, pregnancy or child birth, divorce, or loss of a loved one. People with schizophrenia may see, hear, or feel things that do not exist (hallucinations). They may have false beliefs in spite of obvious proof to the contrary (delusions). Sometimes speech is incoherent or behavior is odd or withdrawn.  °DIAGNOSIS °Schizophrenia is diagnosed through an assessment by your caregiver. Your caregiver will ask questions about your thoughts, behavior, mood, and ability to function in daily life. Your caregiver may ask questions about your medical history and use of alcohol or drugs, including prescription medication. Your caregiver may also order blood tests and imaging exams. Certain medical conditions and substances can cause symptoms that resemble schizophrenia. Your caregiver may refer you to a mental health specialist for evaluation. There are three major criterion for a diagnosis of schizophrenia: °· Two or more of the following five symptoms are present for a month or longer: °¨ Delusions. Often  the delusions are that you are being attacked, harassed, cheated, persecuted or conspired against (persecutory delusions). °¨ Hallucinations.   °¨ Disorganized speech that does not make sense to others. °¨ Grossly disorganized (confused or unfocused) behavior or extremely overactive or underactive motor activity (catatonia). °¨ Negative symptoms such as bland or blunted emotions (flat affect), loss of will power (avolition), and withdrawal from social contacts (social isolation). °· Level of functioning in one or more major areas of life (work, school, relationships, or self-care) is markedly below the level of functioning before the onset of illness.   °· There are continuous signs of illness (either mild symptoms or decreased level of functioning) for at least 6 months or longer. °TREATMENT  °Schizophrenia is a long-term illness. It is best controlled with continuous treatment rather than treatment only when symptoms occur. The following treatments are used to manage schizophrenia: °· Medication--Medication is the most effective and important form of treatment for schizophrenia. Antipsychotic medications are usually prescribed to help manage schizophrenia. Other types of medication may be added to relieve any symptoms that may occur despite the use of antipsychotic medications. °· Counseling or talk therapy--Individual, group, or family counseling may be helpful in providing education, support, and guidance. Many people with schizophrenia also benefit from social skills and job skills (vocational) training. °A combination of medication and counseling is best for managing the disorder over time. A procedure in which electricity is applied to the brain through the scalp (electroconvulsive therapy) may be used to treat catatonic schizophrenia or schizophrenia in people who cannot take or do not respond to medication and counseling. °  °This information is not intended to replace advice given to you by your health  care provider. Make sure you discuss any questions you have with   your health care provider. °  °Document Released: 04/02/2000 Document Revised: 04/26/2014 Document Reviewed: 06/28/2012 °Elsevier Interactive Patient Education ©2016 [employer] ° °

## 2015-06-30 NOTE — Progress Notes (Signed)
[place] she requested I contact  Ronney Asters and Mariann Laster  The Pharmacist, hospital . LCSW called Pilar Grammes [phone]  and [facility] [phone] and left message awaiting call back. Left message with care providers Called Fayne Norrie guardian [phone] with [facility] [employer] and explained patient was assessed and psychiatrically stable. It was explained pt would be discharged. She will try to reach providers. Awaiting call backs Called [facility] and requested  To speak to pts Act team providers [phone] left a message awaiting a call back  [facility] [phone]

## 2015-06-30 NOTE — Progress Notes (Signed)
In consultation with Dr Elsworth Soho was requested to fax medication changes over to [facility]. This is done.  [employer] LCSW [phone]
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
## 2015-07-20 ENCOUNTER — Encounter: Payer: Self-pay | Admitting: Emergency Medicine

## 2015-07-20 ENCOUNTER — Emergency Department
Admission: EM | Admit: 2015-07-20 | Discharge: 2015-07-22 | Disposition: A | Discharge status: Other Acute Inpatient Hospital | Payer: Medicaid Other | Attending: Emergency Medicine | Admitting: Emergency Medicine

## 2015-07-20 DIAGNOSIS — F250 Schizoaffective disorder, bipolar type: Secondary | ICD-10-CM | POA: Diagnosis present

## 2015-07-20 DIAGNOSIS — R45851 Suicidal ideations: Secondary | ICD-10-CM | POA: Diagnosis not present

## 2015-07-20 DIAGNOSIS — F4325 Adjustment disorder with mixed disturbance of emotions and conduct: Secondary | ICD-10-CM | POA: Diagnosis not present

## 2015-07-20 DIAGNOSIS — F17210 Nicotine dependence, cigarettes, uncomplicated: Secondary | ICD-10-CM | POA: Diagnosis not present

## 2015-07-20 DIAGNOSIS — F809 Developmental disorder of speech and language, unspecified: Secondary | ICD-10-CM

## 2015-07-20 DIAGNOSIS — F919 Conduct disorder, unspecified: Secondary | ICD-10-CM | POA: Diagnosis present

## 2015-07-20 DIAGNOSIS — F603 Borderline personality disorder: Secondary | ICD-10-CM | POA: Diagnosis present

## 2015-07-20 DIAGNOSIS — F17200 Nicotine dependence, unspecified, uncomplicated: Secondary | ICD-10-CM | POA: Diagnosis present

## 2015-07-20 DIAGNOSIS — F849 Pervasive developmental disorder, unspecified: Secondary | ICD-10-CM

## 2015-07-20 DIAGNOSIS — F259 Schizoaffective disorder, unspecified: Secondary | ICD-10-CM | POA: Diagnosis not present

## 2015-07-20 LAB — COMPREHENSIVE METABOLIC PANEL
ALT: 13 U/L — AB (ref 17–63)
ANION GAP: 9 (ref 5–15)
AST: 15 U/L (ref 15–41)
Albumin: 4.6 g/dL (ref 3.5–5.0)
Alkaline Phosphatase: 73 U/L (ref 38–126)
BUN: 6 mg/dL (ref 6–20)
CHLORIDE: 99 mmol/L — AB (ref 101–111)
CO2: 19 mmol/L — AB (ref 22–32)
Calcium: 9.4 mg/dL (ref 8.9–10.3)
Creatinine, Ser: 0.94 mg/dL (ref 0.61–1.24)
GFR calc non Af Amer: >60 mL/min (ref >60)
Glucose, Bld: 92 mg/dL (ref 65–99)
POTASSIUM: 3.3 mmol/L — AB (ref 3.5–5.1)
SODIUM: 127 mmol/L — AB (ref 135–145)
Total Bilirubin: 0.3 mg/dL (ref 0.3–1.2)
Total Protein: 7.2 g/dL (ref 6.5–8.1)

## 2015-07-20 LAB — CBC
HEMATOCRIT: 41.9 % (ref 40.0–52.0)
Hemoglobin: 14.4 g/dL (ref 13.0–18.0)
MCH: 33.2 pg (ref 26.0–34.0)
MCHC: 34.2 g/dL (ref 32.0–36.0)
MCV: 96.9 fL (ref 80.0–100.0)
PLATELETS: 214 10*3/uL (ref 150–440)
RBC: 4.33 MIL/uL — AB (ref 4.40–5.90)
RDW: 12.7 % (ref 11.5–14.5)
WBC: 7.8 10*3/uL (ref 3.8–10.6)

## 2015-07-20 LAB — URINE DRUG SCREEN, QUALITATIVE (ARMC ONLY)
AMPHETAMINES, UR SCREEN: NOT DETECTED
BENZODIAZEPINE, UR SCRN: NOT DETECTED
Barbiturates, Ur Screen: NOT DETECTED
Cannabinoid 50 Ng, Ur ~~LOC~~: NOT DETECTED
Cocaine Metabolite,Ur ~~LOC~~: NOT DETECTED
MDMA (ECSTASY) UR SCREEN: NOT DETECTED
METHADONE SCREEN, URINE: NOT DETECTED
OPIATE, UR SCREEN: NOT DETECTED
PHENCYCLIDINE (PCP) UR S: NOT DETECTED
Tricyclic, Ur Screen: POSITIVE — AB

## 2015-07-20 LAB — CARBAMAZEPINE LEVEL, TOTAL: CARBAMAZEPINE LVL: 8.9 ug/mL (ref 4.0–12.0)

## 2015-07-20 LAB — ETHANOL: Alcohol, Ethyl (B): <5 mg/dL (ref <5)

## 2015-07-20 LAB — SALICYLATE LEVEL

## 2015-07-20 LAB — ACETAMINOPHEN LEVEL

## 2015-07-20 MED ORDER — NICOTINE 14 MG/24HR TD PT24
14.0000 mg | MEDICATED_PATCH | Freq: Once | TRANSDERMAL | Status: AC
  Administered 2015-07-20: 14 mg via TRANSDERMAL
  Filled 2015-07-20: qty 1

## 2015-07-20 NOTE — ED Notes (Signed)
14 mg nicotine patch applied to left arm by this RN. Patient denies needs at this time.Q15 minutes check and observation maintained by security cameras for safety.

## 2015-07-20 NOTE — ED Notes (Signed)

## 2015-07-20 NOTE — ED Notes (Signed)
Patient lying in bed resting quietly. No noted distress or abnormal behavior noted or observed. Patient cooperative with nursing assessment. Will continue 15 minute checks and observation maintained by security cameras for safety.
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
## 2015-07-20 NOTE — ED Notes (Signed)
Patient states he has been "acting out" towards other people and threatening people with a walking stick. Patient also reports he has not been eating or drinking well. He also stated he has not been sleeping well. Patient does report SI with a plan to walk into the road.  He also has a plan to harm others with a walking stick.  Patient is cooperative in triage.
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
## 2015-07-20 NOTE — ED Notes (Signed)
Report hand-off report given to Amy, RN.

## 2015-07-21 DIAGNOSIS — F4325 Adjustment disorder with mixed disturbance of emotions and conduct: Secondary | ICD-10-CM

## 2015-07-21 NOTE — ED Notes (Signed)
Patient calm and cooperative this shift. Patient remains in bed resting quietly. No s/s distress noted. Will continue 15 minute checks and observation maintained by security cameras for safety.

## 2015-07-21 NOTE — ED Notes (Signed)
Patient asleep in room. No noted distress or abnormal behavior. Will continue 15 minute checks and observation by security cameras for safety. 

## 2015-07-21 NOTE — ED Notes (Signed)
Call Pilar Grammes to notify D/C and for pick up, no answer, left message Called Isaias Sakai no answer, left message Called guardian Fayne Norrie, no answer, left message.

## 2015-07-21 NOTE — ED Notes (Signed)
ENVIRONMENTAL ASSESSMENT Potentially harmful objects out of patient reach: Yes Personal belongings secured: Yes Patient dressed in hospital provided attire only: Yes Plastic bags out of patient reach: Yes Patient care equipment (cords, cables, call bells, lines, and drains) shortened, removed, or accounted for: Yes Equipment and supplies removed from bottom of stretcher: Yes Potentially toxic materials out of patient reach: Yes Sharps container removed or out of patient reach: Yes Maintained on 15 minute checks and observation by security camera for safety.

## 2015-07-21 NOTE — ED Notes (Signed)
BEHAVIORAL HEALTH ROUNDING Patient sleeping: No. Patient alert and oriented: yes Behavior appropriate: Yes.  ; If no, describe:  Nutrition and fluids offered: Yes  Toileting and hygiene offered: Yes  Sitter present: no Law enforcement present: No 

## 2015-07-21 NOTE — ED Notes (Signed)
Patient has been cooperative with all nursing interventions. He is asking when he can go home. Awaiting disposition decision from MD and TTS. Maintained on 15 minute checks and observation by security camera for safety.
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
## 2015-07-21 NOTE — ED Notes (Signed)
Sandwich and soft drink given.  

## 2015-07-21 NOTE — ED Notes (Signed)
Patient continues to sleep. Meal left at bedside.Maintained on 15 minute checks and observation by security camera for safety.

## 2015-07-21 NOTE — ED Notes (Signed)
Pt. Alert and oriented, warm and dry, in no distress. Pt. Denies SI, HI, and AVH. Pt. Encouraged to let nursing staff know of any concerns or needs. 

## 2015-07-21 NOTE — Discharge Instructions (Signed)
Schizoaffective Disorder  Schizoaffective disorder (ScAD) is a mental illness. It causes symptoms that are a mixture of schizophrenia (a psychotic disorder) and an affective (mood) disorder. The schizophrenic symptoms may include delusions, hallucinations, or odd behavior. The mood symptoms may be similar to major depression or bipolar disorder. ScAD may interfere with personal relationships or normal daily activities. People with ScAD are at increased risk for job loss, social isolation, physical health problems, anxiety and substance use disorders, and suicide.  ScAD usually occurs in cycles. Periods of severe symptoms are followed by periods of  less severe symptoms or improvement. The illness affects men and women equally but usually appears at an earlier age (teenage or early adult years) in men. People who have family members with schizophrenia, bipolar disorder, or ScAD are at higher risk of developing ScAD.  SYMPTOMS   At any one time, people with ScAD may have psychotic symptoms only or both psychotic and mood symptoms. The psychotic symptoms include one or more of the following:  · Hearing, seeing, or feeling things that are not there (hallucinations).    · Having fixed, false beliefs (delusions). The delusions usually are of being attacked, harassed, cheated, persecuted, or conspired against (paranoid delusions).  · Speaking in a way that makes no sense to others (disorganized speech).  The psychotic symptoms of ScAD may also include confusing or odd behavior or any of the negative symptoms of schizophrenia. These include loss of motivation for normal daily activities, such as bathing or grooming, withdrawal from other people, and lack of emotions.     The mood symptoms of ScAD occur more often than not. They resemble major depressive disorder or bipolar mania. Symptoms of major depression include depressed mood and four or more of the following:  · Loss of interest in usually pleasurable activities  (anhedonia).  · Sleeping more or less than normal.  · Feeling worthless or excessively guilty.  · Lack of energy or motivation.  · Trouble concentrating.  · Eating more or less than usual.  · Thinking a lot about death or suicide.  Symptoms of bipolar mania include abnormally elevated or irritable mood and increased energy or activity, plus three or more of the following:    · More confidence than normal or feeling that you are able to do anything (grandiosity).  · Feeling rested with less sleep than normal.    · Being easily distracted.    · Talking more than usual or feeling pressured to keep talking.    · Feeling that your thoughts are racing.  · Engaging in high-risk activities such as buying sprees or foolish business decisions.  DIAGNOSIS   ScAD is diagnosed through an assessment by your health care provider. Your health care provider will observe and ask questions about your thoughts, behavior, mood, and ability to function in daily life. Your health care provider may also ask questions about your medical history and use of drugs, including prescription medicines. Your health care provider may also order blood tests and imaging exams. Certain medical conditions and substances can cause symptoms that resemble ScAD. Your health care provider may refer you to a mental health specialist for evaluation.   ScAD is divided into two types. The depressive type is diagnosed if your mood symptoms are limited to major depression. The bipolar type is diagnosed if your mood symptoms are manic or a mixture of manic and depressive symptoms  TREATMENT   ScAD is usually a lifelong illness. Long-term treatment is necessary. The following treatments are available:  · Medicine. Different types of   medicine are used to treat ScAD. The exact combination depends on the type and severity of your symptoms. Antipsychotic medicine is used to control psychotic symptoms such as delusions, paranoia, and hallucinations. Mood stabilizers can  even the highs and lows of bipolar manic mood swings. Antidepressant medicines are used to treat major depressive symptoms.  · Counseling or talk therapy. Individual, group, or family counseling may be helpful in providing education, support, and guidance. Many people with ScAD also benefit from social skills and job skills (vocational) training.  A combination of medicine and counseling is usually best for managing the disorder over time. A procedure in which electricity is applied to the brain through the scalp (electroconvulsive therapy) may be used to treat people with severe manic symptoms that do not respond to medicine and counseling.  HOME CARE INSTRUCTIONS   · Take all your medicine as prescribed.  · Check with your health care provider before starting new prescription or over-the-counter medicines.  · Keep all follow up appointments with your health care provider.  SEEK MEDICAL CARE IF:   · If you are not able to take your medicines as prescribed.  · If your symptoms get worse.  SEEK IMMEDIATE MEDICAL CARE IF:   · You have serious thoughts about hurting yourself or others.     This information is not intended to replace advice given to you by your health care provider. Make sure you discuss any questions you have with your health care provider.     Document Released: 08/16/2006 Document Revised: 04/26/2014 Document Reviewed: 11/17/2012  Elsevier Interactive Patient Education ©2016 [employer]

## 2015-07-21 NOTE — ED Provider Notes (Signed)
-----------------------------------------   7:37 AM on 07/21/2015 -----------------------------------------   Blood pressure 121/78, pulse 86, temperature 98.2 F (36.8 C), temperature source Oral, resp. rate 19, height 5\' 9"  (1.753 m), weight 68.04 kg, SpO2 98 %.  The patient had no acute events since last update.  Calm and cooperative at this time.  Disposition is pending per Psychiatry/Behavioral Medicine team recommendations.     Hinda Kehr, MD 07/21/15 [phone]

## 2015-07-21 NOTE — ED Provider Notes (Signed)
-----------------------------------------   7:10 PM on 07/21/2015 -----------------------------------------  Patient seen and evaluated by a psychiatrist. He does not meet criteria for acute inpatient involuntary commitment and is willing to and eager to go home. Dr. Carlena Hurl discontinued the IVC>  We will discharge him for close outpatient follow-up.  Schuyler Amor, MD 07/21/15 1911

## 2015-07-21 NOTE — ED Notes (Signed)
Patient resting quietly in room. No noted distress or abnormal behaviors noted. Will continue 15 minute checks and observation by security camera for safety. 

## 2015-07-21 NOTE — Progress Notes (Signed)
LCSW Called Fayne Norrie [phone] and informed her that patient is here and has yet to be seen by psychiatrist and will likely discharge back to group home. LCSW Called [employer] from [place] [facility] and awaiting a call back [employer] LCSW [phone]

## 2015-07-21 NOTE — ED Notes (Signed)

## 2015-07-22 NOTE — ED Notes (Signed)
Patient discharged back to group home. Denies SI/HI/AVH and pain. All belongings returned to patient. Discharge instructions refused to be signed but instructions were given to care worker from group home.

## 2015-07-22 NOTE — ED Notes (Signed)
Patient mother called wishing to speak to patient. Mother was informed that phone hours would start again at 1pm and that the patient would receive a message that she called.

## 2015-07-22 NOTE — ED Notes (Signed)
Patient asleep in room. No noted distress or abnormal behavior. Will continue 15 minute checks and observation by security cameras for safety. 

## 2015-07-22 NOTE — ED Notes (Signed)
Called Isaias Sakai for ETA for pickup. States that she will be here for pickup in a few minutes. No signs of distress at this time. Maintained on 15 minute checks and observation by security camera for safety.

## 2015-07-22 NOTE — ED Notes (Signed)
Pt. Noted in room. No complaints or concerns voiced. No distress or abnormal behavior noted. Will continue to monitor with security cameras. Q 15 minute rounds continue. 

## 2015-07-22 NOTE — ED Notes (Signed)
Patient resting quietly in room. No noted distress or abnormal behaviors noted. Will continue 15 minute checks and observation by security camera for safety. 

## 2015-07-22 NOTE — ED Notes (Signed)
Patient placed a weighted chair in front of the door of his room. When nurse and security went to open the door, patient became very agitated and then began to turn red in the face and began to yell obscenities at staff. When nurse asked the patient what was wrong, he yelled that he didn't want to leave as he has been slated for discharge today. Nurse began to provide reassurance to patient and stated that staff would be available to support patient through this transition. Patient began to become slightly calmer and pulled the covers over his head. At this time the patient is calm and is attempting to sleep. Chair was removed from patient's room. Will continue to monitor for increased anxiety and agitation.

## 2015-07-22 NOTE — ED Notes (Signed)
Patient refused breakfast at this time

## 2015-07-22 NOTE — ED Notes (Signed)
ENVIRONMENTAL ASSESSMENT Potentially harmful objects out of patient reach: Yes Personal belongings secured: Yes Patient dressed in hospital provided attire only: Yes Plastic bags out of patient reach: Yes Patient care equipment (cords, cables, call bells, lines, and drains) shortened, removed, or accounted for: Yes Equipment and supplies removed from bottom of stretcher: Yes Potentially toxic materials out of patient reach: Yes Sharps container removed or out of patient reach: Yes  Patient currently in room resting. No signs of distress noted at this time. Maintained on 15 minute checks and observation by security camera for safety.

## 2015-07-22 NOTE — ED Notes (Signed)
Nurse called Malon Kindle with no answer and Mariann Laster, who stated that she would be here in a while to pick up the patient after running a couple of other drop offs.

## 2015-07-22 NOTE — ED Notes (Signed)
Nurse contacted guardian Fayne Norrie to inform her of patient's discharge. Guardian agreeable with plan. No further questions at this time.

## 2015-07-31 ENCOUNTER — Emergency Department
Admission: EM | Admit: 2015-07-31 | Discharge: 2015-08-01 | Disposition: A | Discharge status: Home / Self Care | Payer: Medicaid Other | Attending: Emergency Medicine | Admitting: Emergency Medicine

## 2015-07-31 DIAGNOSIS — Z79899 Other long term (current) drug therapy: Secondary | ICD-10-CM | POA: Insufficient documentation

## 2015-07-31 DIAGNOSIS — Z915 Personal history of self-harm: Secondary | ICD-10-CM | POA: Diagnosis not present

## 2015-07-31 DIAGNOSIS — Z8659 Personal history of other mental and behavioral disorders: Secondary | ICD-10-CM | POA: Insufficient documentation

## 2015-07-31 DIAGNOSIS — R45851 Suicidal ideations: Secondary | ICD-10-CM | POA: Diagnosis present

## 2015-07-31 DIAGNOSIS — F17210 Nicotine dependence, cigarettes, uncomplicated: Secondary | ICD-10-CM | POA: Diagnosis not present

## 2015-07-31 DIAGNOSIS — F250 Schizoaffective disorder, bipolar type: Secondary | ICD-10-CM | POA: Insufficient documentation

## 2015-07-31 DIAGNOSIS — R451 Restlessness and agitation: Secondary | ICD-10-CM

## 2015-07-31 DIAGNOSIS — F849 Pervasive developmental disorder, unspecified: Secondary | ICD-10-CM

## 2015-07-31 DIAGNOSIS — F809 Developmental disorder of speech and language, unspecified: Secondary | ICD-10-CM

## 2015-07-31 HISTORY — DX: Schizoaffective disorder, bipolar type: F25.0

## 2015-07-31 HISTORY — DX: Adjustment disorder with mixed disturbance of emotions and conduct: F43.25

## 2015-07-31 LAB — COMPREHENSIVE METABOLIC PANEL
ALBUMIN: 4.4 g/dL (ref 3.5–5.0)
ALT: 12 U/L — AB (ref 17–63)
AST: 18 U/L (ref 15–41)
Alkaline Phosphatase: 86 U/L (ref 38–126)
Anion gap: 6 (ref 5–15)
BUN: 5 mg/dL — AB (ref 6–20)
CHLORIDE: 107 mmol/L (ref 101–111)
CO2: 22 mmol/L (ref 22–32)
CREATININE: 1.16 mg/dL (ref 0.61–1.24)
Calcium: 9.2 mg/dL (ref 8.9–10.3)
GFR calc Af Amer: >60 mL/min (ref >60)
GFR calc non Af Amer: >60 mL/min (ref >60)
GLUCOSE: 92 mg/dL (ref 65–99)
Potassium: 3.5 mmol/L (ref 3.5–5.1)
Sodium: 135 mmol/L (ref 135–145)
Total Bilirubin: 0.6 mg/dL (ref 0.3–1.2)
Total Protein: 7.1 g/dL (ref 6.5–8.1)

## 2015-07-31 LAB — URINE DRUG SCREEN, QUALITATIVE (ARMC ONLY)
Amphetamines, Ur Screen: NOT DETECTED
BARBITURATES, UR SCREEN: NOT DETECTED
BENZODIAZEPINE, UR SCRN: NOT DETECTED
Cannabinoid 50 Ng, Ur ~~LOC~~: NOT DETECTED
Cocaine Metabolite,Ur ~~LOC~~: NOT DETECTED
MDMA (Ecstasy)Ur Screen: NOT DETECTED
METHADONE SCREEN, URINE: NOT DETECTED
Opiate, Ur Screen: NOT DETECTED
Phencyclidine (PCP) Ur S: NOT DETECTED
TRICYCLIC, UR SCREEN: NOT DETECTED

## 2015-07-31 LAB — ETHANOL

## 2015-07-31 LAB — CBC
HCT: 42 % (ref 40.0–52.0)
HEMOGLOBIN: 14.4 g/dL (ref 13.0–18.0)
MCH: 33.9 pg (ref 26.0–34.0)
MCHC: 34.4 g/dL (ref 32.0–36.0)
MCV: 98.4 fL (ref 80.0–100.0)
Platelets: 226 10*3/uL (ref 150–440)
RBC: 4.26 MIL/uL — AB (ref 4.40–5.90)
RDW: 13 % (ref 11.5–14.5)
WBC: 6.9 10*3/uL (ref 3.8–10.6)

## 2015-07-31 LAB — ACETAMINOPHEN LEVEL: Acetaminophen (Tylenol), Serum: <10 ug/mL — ABNORMAL LOW (ref 10–30)

## 2015-07-31 LAB — SALICYLATE LEVEL: Salicylate Lvl: <4 mg/dL (ref 2.8–30.0)

## 2015-07-31 NOTE — ED Provider Notes (Signed)
-----------------------------------------   2:24 PM on 07/31/2015 -----------------------------------------   Blood pressure 109/78, pulse 84, temperature 98.5 F (36.9 C), temperature source Oral, resp. rate 16, SpO2 96 %.  The patient had no acute events since last update.  Calm and cooperative at this time.  Case discussed with psychiatry Dr. Weber Cooks in the emergency department after his evaluation. Patient appears to be psychiatrically stable. He remains medically stable as well. We'll discharge him back to his group home and have him follow-up with his ACT team   Carrie Mew, MD 07/31/15 1424

## 2015-07-31 NOTE — ED Notes (Signed)
BEHAVIORAL HEALTH ROUNDING Patient sleeping: Yes.   Patient alert and oriented: Pt asleep Behavior appropriate: Yes.  ; If no, describe:  Nutrition and fluids offered: Pt asleept Toileting and hygiene offered: Pt asleept Sitter present: ED Rover Present Law enforcement present: Yes
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
## 2015-07-31 NOTE — ED Notes (Signed)
Patient continues to sleep soundly. No needs at this time.  

## 2015-07-31 NOTE — ED Notes (Signed)
Phone # [phone] that we have and is on the internet for [facility] has been disconnected. I have notified the MD, Kerry Dory, TTS, and [place], Sec, to help in contacting care home.

## 2015-07-31 NOTE — ED Notes (Signed)
Pt bib EMS from [facility] in [place] w/ c/o med eval and thoughts of hurting others. Per EMS, pt walked off earlier from group home, group home considered having pt IVC'd but did not.  Pt requested psych eval this evening stating that "his meds aren't working" and that he feels like "breaking things".  Pt A/Ox4. Pt sts he has SI/HI but no plans.    VSS per EMS.

## 2015-07-31 NOTE — ED Notes (Addendum)
BEHAVIORAL HEALTH ROUNDING Patient sleeping: Yes.   Patient alert and oriented: Pt asleep Behavior appropriate: Yes.  ; If no, describe:  Nutrition and fluids offered: Pt asleep Toileting and hygiene offered: Pt asleept Sitter present: ED Rover Present Law enforcement present: N/A Pt voluntary.

## 2015-07-31 NOTE — Discharge Instructions (Signed)
Schizoaffective Disorder Schizoaffective disorder (ScAD) is a mental illness. It causes symptoms that are a mixture of schizophrenia (a psychotic disorder) and an affective (mood) disorder. The schizophrenic symptoms may include delusions, hallucinations, or odd behavior. The mood symptoms may be similar to major depression or bipolar disorder. ScAD may interfere with personal relationships or normal daily activities. People with ScAD are at increased risk for job loss, social isolation,physical health problems, anxiety and substance use disorders, and suicide. ScAD usually occurs in cycles. Periods of severe symptoms are followed by periods of less severe symptoms or improvement. The illness affects men and women equally but usually appears at an earlier age (teenage or early adult years) in men. People who have family members with schizophrenia, bipolar disorder, or ScAD are at higher risk of developing ScAD. SYMPTOMS  At any one time, people with ScAD may have psychotic symptoms only or both psychotic and mood symptoms. The psychotic symptoms include one or more of the following:  Hearing, seeing, or feeling things that are not there (hallucinations).   Having fixed, false beliefs (delusions). The delusions usually are of being attacked, harassed, cheated, persecuted, or conspired against (paranoid delusions).  Speaking in a way that makes no sense to others (disorganized speech). The psychotic symptoms of ScAD may also include confusing or odd behavior or any of the negative symptoms of schizophrenia. These include loss of motivation for normal daily activities, such as bathing or grooming, withdrawal from other people, and lack of emotions.  The mood symptoms of ScAD occur more often than not. They resemble major depressive disorder or bipolar mania. Symptoms of major depression include depressed mood and four or more of the following:  Loss of interest in usually pleasurable activities  (anhedonia).  Sleeping more or less than normal.  Feeling worthless or excessively guilty.  Lack of energy or motivation.  Trouble concentrating.  Eating more or less than usual.  Thinking a lot about death or suicide. Symptoms of bipolar mania include abnormally elevated or irritable mood and increased energy or activity, plus three or more of the following:   More confidence than normal or feeling that you are able to do anything (grandiosity).  Feeling rested with less sleep than normal.   Being easily distracted.   Talking more than usual or feeling pressured to keep talking.   Feeling that your thoughts are racing.  Engaging in high-risk activities such as buying sprees or foolish business decisions. DIAGNOSIS  ScAD is diagnosed through an assessment by your health care provider. Your health care provider will observe and ask questions about your thoughts, behavior, mood, and ability to function in daily life. Your health care provider may also ask questions about your medical history and use of drugs, including prescription medicines. Your health care provider may also order blood tests and imaging exams. Certain medical conditions and substances can cause symptoms that resemble ScAD. Your health care provider may refer you to a mental health specialist for evaluation.  ScAD is divided into two types. The depressive type is diagnosed if your mood symptoms are limited to major depression. The bipolar type is diagnosed if your mood symptoms are manic or a mixture of manic and depressive symptoms TREATMENT  ScAD is usually a lifelong illness. Long-term treatment is necessary. The following treatments are available:  Medicine. Different types of medicine are used to treat ScAD. The exact combination depends on the type and severity of your symptoms. Antipsychotic medicine is used to control psychotic symptomssuch as delusions, paranoia,  and hallucinations. Mood stabilizers can  even the highs and lows of bipolar manic mood swings. Antidepressant medicines are used to treat major depressive symptoms.  Counseling or talk therapy. Individual, group, or family counseling may be helpful in providing education, support, and guidance. Many people with ScAD also benefit from social skills and job skills (vocational) training. A combination of medicine and counseling is usually best for managing the disorder over time. A procedure in which electricity is applied to the brain through the scalp (electroconvulsive therapy) may be used to treat people with severe manic symptoms that do not respond to medicine and counseling. HOME CARE INSTRUCTIONS   Take all your medicine as prescribed.  Check with your health care provider before starting new prescription or over-the-counter medicines.  Keep all follow up appointments with your health care provider. SEEK MEDICAL CARE IF:   If you are not able to take your medicines as prescribed.  If your symptoms get worse. SEEK IMMEDIATE MEDICAL CARE IF:   You have serious thoughts about hurting yourself or others.   This information is not intended to replace advice given to you by your health care provider. Make sure you discuss any questions you have with your health care provider.   Document Released: 08/16/2006 Document Revised: 04/26/2014 Document Reviewed: 11/17/2012 Elsevier Interactive Patient Education 2016 [employer].  Suicidal Feelings: How to Help Yourself Suicide is the taking of one's own life. If you feel as though life is getting too tough to handle and are thinking about suicide, get help right away. To get help:  Call your local emergency services (911 in the [place]  Call a suicide hotline to speak with a trained counselor who understands how you are feeling. The following is a list of suicide hotlines in the [place]. For a list of hotlines in [place], visit  FindSkins.pl.  1-800-273-TALK 8020846088).  1-800-SUICIDE (620) 164-9276).  4167356674. This is a hotline for Spanish speakers.  U1166179 360-295-0607). This is a hotline for TTY users.  1-866-4-U-TREVOR 7855826490). This is a hotline for lesbian, gay, bisexual, transgender, or questioning youth.  Contact a crisis center or a local suicide prevention center. To find a crisis center or suicide prevention center:  Call your local hospital, clinic, community service organization, mental health center, social service provider, or health department. Ask for assistance in connecting to a crisis center.  Visit BankingRep.com.au for a list of crisis centers in the [place], or visit www.suicideprevention.ca/thinking-about-suicide/find-a-crisis-centre for a list of centers in [place].  Visit the following websites:  National Suicide Prevention Lifeline: www.suicidepreventionlifeline.org  Hopeline: www.hopeline.[place] for Suicide Prevention: PromotionalLoans.co.za  The [employer] (for lesbian, gay, bisexual, transgender, or questioning youth): www.thetrevorproject.org HOW CAN I HELP MYSELF FEEL BETTER?  Promise yourself that you will not do anything drastic when you have suicidal feelings. Remember, there is hope. Many people have gotten through suicidal thoughts and feelings, and you will, too. You may have gotten through them before, and this proves that you can get through them again.  Let family, friends, teachers, or counselors know how you are feeling. Try not to isolate yourself from those who care about you. Remember, they will want to help you. Talk with someone every day, even if you do not feel sociable. Face-to-face conversation is best.  Call a mental health professional and see one regularly.  Visit your primary health care provider every  year.  Eat a well-balanced diet, and space your meals so you eat regularly.  Get plenty  of rest.  Avoid alcohol and drugs, and remove them from your home. They will only make you feel worse.  If you are thinking of taking a lot of medicine, give your medicine to someone who can give it to you one day at a time. If you are on antidepressants and are concerned you will overdose, let your health care provider know so he or she can give you safer medicines. Ask your mental health professional about the possible side effects of any medicines you are taking.  Remove weapons, poisons, knives, and anything else that could harm you from your home.  Try to stick to routines. Follow a schedule every day. Put self-care on your schedule.  Make a list of realistic goals, and cross them off when you achieve them. Accomplishments give a sense of worth.  Wait until you are feeling better before doing the things you find difficult or unpleasant.  Exercise if you are able. You will feel better if you exercise for even a half hour each day.  Go out in the sun or into nature. This will help you recover from depression faster. If you have a favorite place to walk, go there.  Do the things that have always given you pleasure. Play your favorite music, read a good book, paint a picture, play your favorite instrument, or do anything else that takes your mind off your depression if it is safe to do.  Keep your living space well lit.  When you are feeling well, write yourself a letter about tips and support that you can read when you are not feeling well.  Remember that life's difficulties can be sorted out with help. Conditions can be treated. You can work on thoughts and strategies that serve you well.   This information is not intended to replace advice given to you by your health care provider. Make sure you discuss any questions you have with your health care provider.   Document Released: 10/10/2002  Document Revised: 04/26/2014 Document Reviewed: 07/31/2013 Elsevier Interactive Patient Education [employer].
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
## 2015-07-31 NOTE — Progress Notes (Signed)
Patient ID: Ryan Zuniga, male   DOB: Sep 01, 1980, 35 y.o.   MRN: [id number]  CSW spoke with Ryan Sakai, RN at [facility] [phone]. She is concerned about the patients behavior. He locks himself in rooms with other residents, he hides lighters and reports he is a danger to other residents. He tends to run away from group homes when he becomes upset. She reports there is no apparent trigger for his behaviors. He tends to become angry quickly and easily. He was given a 30 day notice earlier this month and will be discharged from group home by May. Mrs. Ryan Zuniga will arrange transportation from ED to home for this afternoon.   [place] MSW, [place]  07/31/2015 4:03 PM

## 2015-07-31 NOTE — ED Notes (Signed)
BEHAVIORAL HEALTH ROUNDING Patient sleeping: Yes.   Patient alert and oriented: pt asleep Behavior appropriate: Yes.  ; If no, describe:  Nutrition and fluids offered: pt asleept Toileting and hygiene offered: pt asleep Sitter present: ED Rover Present Law enforcement present: yes

## 2015-08-01 NOTE — ED Notes (Signed)
pts group home staff picked him up, pt was cooperative with staff, he did reufse to sign e signature

## 2015-08-01 NOTE — ED Provider Notes (Signed)
-----------------------------------------   6:44 AM on 08/01/2015 -----------------------------------------  No acute events, patient remains calm and cooperative. Patient is to return home to his group home. They will pick up the patient this morning.  Harvest Dark, MD 08/01/15 [phone]

## 2015-08-01 NOTE — ED Notes (Signed)
Patient denies SI/HI/AVH.  No distress noted.  Patient oriented to unit.

## 2015-08-07 ENCOUNTER — Emergency Department
Admission: EM | Admit: 2015-08-07 | Discharge: 2015-08-20 | Disposition: A | Discharge status: Home / Self Care | Payer: Medicaid Other | Attending: Emergency Medicine | Admitting: Emergency Medicine

## 2015-08-07 ENCOUNTER — Encounter: Payer: Self-pay | Admitting: Emergency Medicine

## 2015-08-07 DIAGNOSIS — R451 Restlessness and agitation: Secondary | ICD-10-CM | POA: Diagnosis not present

## 2015-08-07 DIAGNOSIS — F259 Schizoaffective disorder, unspecified: Secondary | ICD-10-CM

## 2015-08-07 DIAGNOSIS — R4689 Other symptoms and signs involving appearance and behavior: Secondary | ICD-10-CM | POA: Diagnosis present

## 2015-08-07 DIAGNOSIS — F849 Pervasive developmental disorder, unspecified: Secondary | ICD-10-CM

## 2015-08-07 DIAGNOSIS — F4325 Adjustment disorder with mixed disturbance of emotions and conduct: Secondary | ICD-10-CM | POA: Diagnosis not present

## 2015-08-07 DIAGNOSIS — F809 Developmental disorder of speech and language, unspecified: Secondary | ICD-10-CM

## 2015-08-07 DIAGNOSIS — F250 Schizoaffective disorder, bipolar type: Secondary | ICD-10-CM | POA: Diagnosis present

## 2015-08-07 DIAGNOSIS — F17210 Nicotine dependence, cigarettes, uncomplicated: Secondary | ICD-10-CM | POA: Diagnosis not present

## 2015-08-07 LAB — URINE DRUG SCREEN, QUALITATIVE (ARMC ONLY)
Amphetamines, Ur Screen: NOT DETECTED
BARBITURATES, UR SCREEN: NOT DETECTED
BENZODIAZEPINE, UR SCRN: POSITIVE — AB
Cannabinoid 50 Ng, Ur ~~LOC~~: NOT DETECTED
Cocaine Metabolite,Ur ~~LOC~~: NOT DETECTED
MDMA (Ecstasy)Ur Screen: NOT DETECTED
METHADONE SCREEN, URINE: NOT DETECTED
Opiate, Ur Screen: NOT DETECTED
Phencyclidine (PCP) Ur S: NOT DETECTED
TRICYCLIC, UR SCREEN: NOT DETECTED

## 2015-08-07 LAB — COMPREHENSIVE METABOLIC PANEL
ALBUMIN: 4.7 g/dL (ref 3.5–5.0)
ALK PHOS: 89 U/L (ref 38–126)
ALT: 17 U/L (ref 17–63)
AST: 17 U/L (ref 15–41)
Anion gap: 10 (ref 5–15)
BILIRUBIN TOTAL: 0.6 mg/dL (ref 0.3–1.2)
BUN: 6 mg/dL (ref 6–20)
CALCIUM: 9.5 mg/dL (ref 8.9–10.3)
CO2: 19 mmol/L — ABNORMAL LOW (ref 22–32)
Chloride: 100 mmol/L — ABNORMAL LOW (ref 101–111)
Creatinine, Ser: 1.14 mg/dL (ref 0.61–1.24)
GFR calc Af Amer: >60 mL/min (ref >60)
GLUCOSE: 102 mg/dL — AB (ref 65–99)
Potassium: 3.7 mmol/L (ref 3.5–5.1)
Sodium: 129 mmol/L — ABNORMAL LOW (ref 135–145)
TOTAL PROTEIN: 7.2 g/dL (ref 6.5–8.1)

## 2015-08-07 LAB — CBC WITH DIFFERENTIAL/PLATELET
BASOS PCT: 0 %
Basophils Absolute: 0 10*3/uL (ref 0–0.1)
EOS ABS: 0.1 10*3/uL (ref 0–0.7)
Eosinophils Relative: 1 %
HCT: 41.1 % (ref 40.0–52.0)
Hemoglobin: 14.3 g/dL (ref 13.0–18.0)
Lymphocytes Relative: 22 %
Lymphs Abs: 1.7 10*3/uL (ref 1.0–3.6)
MCH: 33.5 pg (ref 26.0–34.0)
MCHC: 34.7 g/dL (ref 32.0–36.0)
MCV: 96.5 fL (ref 80.0–100.0)
MONO ABS: 0.5 10*3/uL (ref 0.2–1.0)
MONOS PCT: 7 %
NEUTROS PCT: 70 %
Neutro Abs: 5.2 10*3/uL (ref 1.4–6.5)
Platelets: 213 10*3/uL (ref 150–440)
RBC: 4.26 MIL/uL — ABNORMAL LOW (ref 4.40–5.90)
RDW: 13 % (ref 11.5–14.5)
WBC: 7.5 10*3/uL (ref 3.8–10.6)

## 2015-08-07 LAB — ACETAMINOPHEN LEVEL

## 2015-08-07 LAB — ETHANOL: Alcohol, Ethyl (B): <5 mg/dL (ref <5)

## 2015-08-07 LAB — SALICYLATE LEVEL
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
## 2015-08-08 DIAGNOSIS — F4325 Adjustment disorder with mixed disturbance of emotions and conduct: Secondary | ICD-10-CM | POA: Diagnosis not present

## 2015-08-08 MED ORDER — ARIPIPRAZOLE ER 400 MG IM SUSR
1.0000 | INTRAMUSCULAR | Status: DC
  Administered 2015-08-08: 400 mg via INTRAMUSCULAR
  Filled 2015-08-08 (×2): qty 400

## 2015-08-08 MED ORDER — CARBAMAZEPINE 200 MG PO TABS
300.0000 mg | ORAL_TABLET | Freq: Two times a day (BID) | ORAL | Status: DC
  Administered 2015-08-08: 200 mg via ORAL
  Administered 2015-08-08: 100 mg via ORAL
  Administered 2015-08-09 – 2015-08-15 (×14): 300 mg via ORAL
  Filled 2015-08-08 (×2): qty 1
  Filled 2015-08-08: qty 2
  Filled 2015-08-08: qty 1
  Filled 2015-08-08 (×11): qty 2

## 2015-08-08 MED ORDER — BENZTROPINE MESYLATE 1 MG PO TABS
0.5000 mg | ORAL_TABLET | Freq: Two times a day (BID) | ORAL | Status: DC
  Administered 2015-08-08: 1 mg via ORAL
  Administered 2015-08-08 – 2015-08-14 (×13): 0.5 mg via ORAL
  Filled 2015-08-08 (×14): qty 1

## 2015-08-08 MED ORDER — NICOTINE 21 MG/24HR TD PT24
21.0000 mg | MEDICATED_PATCH | Freq: Every day | TRANSDERMAL | Status: DC
  Administered 2015-08-08 – 2015-08-14 (×8): 21 mg via TRANSDERMAL
  Filled 2015-08-08 (×7): qty 1

## 2015-08-08 MED ORDER — TOPIRAMATE 100 MG PO TABS
100.0000 mg | ORAL_TABLET | Freq: Two times a day (BID) | ORAL | Status: DC
  Administered 2015-08-08 – 2015-08-14 (×14): 100 mg via ORAL
  Filled 2015-08-08 (×20): qty 1

## 2015-08-08 MED ORDER — RISPERIDONE 1 MG PO TABS
1.0000 mg | ORAL_TABLET | Freq: Three times a day (TID) | ORAL | Status: DC
  Administered 2015-08-08 – 2015-08-14 (×20): 1 mg via ORAL
  Filled 2015-08-08 (×21): qty 1

## 2015-08-08 MED ORDER — CARBAMAZEPINE 200 MG PO TABS
ORAL_TABLET | ORAL | Status: AC
  Administered 2015-08-08: 100 mg via ORAL
  Filled 2015-08-08: qty 1

## 2015-08-08 NOTE — ED Notes (Signed)
Patient's breakfast tray given to him, patient states " no, I want to sleep" . Patient is safe, Q 15 min. Checks, and camera monitoring in progress.

## 2015-08-08 NOTE — ED Notes (Signed)
Patient's supper tray given to him. Patient up to bath room, Patient is cooperative, no si/hi and q 15 min. Checks done per staff, and camera monitoring continually.

## 2015-08-08 NOTE — ED Notes (Signed)
Patient had IM abilify injection to left deltoid, He tolerated without difficulty. Patient is safe, No Si/HI.

## 2015-08-08 NOTE — ED Notes (Signed)

## 2015-08-08 NOTE — Progress Notes (Signed)
LCSW called Legal Guardian [name] [phone] Called her supervisor Louann Liv [phone] left a message  Enis Slipper LCSW [phone]
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
## 2015-08-08 NOTE — ED Notes (Signed)
Report was received from Lissa Hoard., RN; Pt. Verbalizes no complaints or distress; denies S.I./Hi. Continue to monitor with 15 min. Monitoring.

## 2015-08-08 NOTE — ED Notes (Signed)
Lunch tray given to Patient, patient alert and oriented, no distress noted.
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
## 2015-08-08 NOTE — ED Notes (Signed)
Patient has been sleeping most of day except to eat and go to bathroom, He is calm and cooperative, no behavior issues, Patient denies Si/Hi and tells nurse " I like to sleep" nurse attempts to have conversation, Patient is not interested, but does answers all questions. Patient with q 15 min. Checks and camera monitoring in progress.

## 2015-08-08 NOTE — ED Notes (Signed)
Report was received Laymond Purser., RN; Pt. Verbalizes no complaints or distress; denies S.I./Hi. Continue to monitor with 15 min. Monitoring.

## 2015-08-08 NOTE — ED Notes (Signed)
Dr, Weber Cooks talking to Patient.
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
## 2015-08-08 NOTE — ED Notes (Signed)
Patient took po medications and is eating breakfast, denies Si/Hi. Patient wants to watch tv, patient is pleasant and cooperative at this time. Nurse is awaiting for the injection of abilify to be sent from pharmacy to administer to patient.

## 2015-08-08 NOTE — ED Notes (Signed)
Contact with Cecilie Lowers, [place] [facility], [phone], [phone], stated that pt behavior has been escalating over the past month

## 2015-08-09 DIAGNOSIS — F4325 Adjustment disorder with mixed disturbance of emotions and conduct: Secondary | ICD-10-CM | POA: Diagnosis not present

## 2015-08-09 NOTE — Progress Notes (Signed)
LCSW was informed patient is NOT attached to [place] ( face sheet not up to date) !!! but rather to [place] and left a detailed message. LCSW awaiting call back. LCSW will attempt to reach mother.  Sakshi Sermons LCSW

## 2015-08-09 NOTE — Progress Notes (Signed)
LCSW has not heard back from Fayne Norrie of [place] [facility]. Called her several times today [phone]  LCSW called [place] DSS after hours number [phone] and [place] and social worker will be calling.
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
## 2015-08-09 NOTE — ED Notes (Signed)
Seen by Dr Jerilee Hoh

## 2015-08-09 NOTE — Progress Notes (Signed)
LCSW called the aftercare line [place] [facility]- [phone]. They will attempt to reach an after hours on call Social worker to consult.LCSW will start calling some facilities to see about bed availabilities.  [employer] LCSW [phone]

## 2015-08-09 NOTE — ED Notes (Signed)
Social work seeing pt earlier
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
## 2015-08-09 NOTE — ED Notes (Signed)
Snack and sugar free, caffeine free drink provided.
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
## 2015-08-09 NOTE — ED Provider Notes (Signed)
Filed Vitals:   08/08/15 0613 08/08/15 1920  BP: 107/71 124/80  Pulse: 78 90  Temp: 98.2 F (36.8 C) 98.6 F (37 C)  Resp: 16    Patient remains stable for psychiatric disposition.  Earleen Newport, MD 08/09/15 [phone]
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
## 2015-08-10 LAB — BASIC METABOLIC PANEL
Anion gap: 8 (ref 5–15)
BUN: 9 mg/dL (ref 6–20)
CALCIUM: 9.4 mg/dL (ref 8.9–10.3)
CO2: 23 mmol/L (ref 22–32)
CREATININE: 1.07 mg/dL (ref 0.61–1.24)
Chloride: 109 mmol/L (ref 101–111)
GFR calc Af Amer: >60 mL/min (ref >60)
GFR calc non Af Amer: >60 mL/min (ref >60)
GLUCOSE: 79 mg/dL (ref 65–99)
Potassium: 3.9 mmol/L (ref 3.5–5.1)
Sodium: 140 mmol/L (ref 135–145)

## 2015-08-10 LAB — CARBAMAZEPINE LEVEL, TOTAL: Carbamazepine Lvl: 9.7 ug/mL (ref 4.0–12.0)

## 2015-08-10 NOTE — ED Notes (Signed)
Patient resting quietly in room. No noted distress or abnormal behaviors noted. Will continue 15 minute checks and observation by security camera for safety. 
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
## 2015-08-10 NOTE — ED Notes (Signed)
PT IVC/ PENDING PLACEMENT  

## 2015-08-10 NOTE — ED Provider Notes (Signed)
-----------------------------------------   9:04 AM on 08/10/2015 -----------------------------------------   Blood pressure 123/79, pulse 67, temperature 98.3 F (36.8 C), temperature source Oral, resp. rate 18, height 5\' 9"  (1.753 m), weight 150 lb (68.04 kg), SpO2 99 %.  The patient had no acute events since last update.  Calm and cooperative at this time.    Patient psychiatrically cleared and is awaiting placement by social work.     Loney Hering, MD 08/10/15 [phone]

## 2015-08-10 NOTE — ED Notes (Addendum)
Patient resting quietly in room. No noted distress or abnormal behaviors noted. Will continue 15 minute checks and observation by security camera for safety. 

## 2015-08-10 NOTE — ED Notes (Signed)
Patient awake and eating breakfast tray. No signs of distress at this time. Maintained on 15 minute checks and observation by security camera for safety.

## 2015-08-10 NOTE — ED Notes (Signed)
Pt is awake in bed watching TV this evening. Pt mood is appropriate, denies SI/HI and AVH and is pleasant and cooperative with staff. Writer discussed tx plan and 15 minute checks are ongoing for safety.

## 2015-08-10 NOTE — ED Notes (Signed)
Patient currently on the phone. Patient is calm and cooperative at this time. No signs of distress noted. Maintained on 15 minute checks and observation by security camera for safety.

## 2015-08-10 NOTE — ED Notes (Signed)
Patient resting quietly in room. Compliant with all scheduled medications. No noted distress or abnormal behaviors noted. Will continue 15 minute checks and observation by security camera for safety.
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
## 2015-08-10 NOTE — ED Notes (Signed)
Patient resting quietly in room. Patient received all scheduled medications.  No noted distress or abnormal behaviors noted. Will continue 15 minute checks and observation by security camera for safety.

## 2015-08-10 NOTE — ED Notes (Signed)
Patient resting and watching television at this time. No signs of acute distress. Maintained on 15 minute checks and observation by security camera for safety.

## 2015-08-10 NOTE — ED Notes (Signed)
Patient asleep in room. No noted distress or abnormal behavior. Will continue 15 minute checks and observation by security cameras for safety. 

## 2015-08-10 NOTE — ED Notes (Signed)
Patient received lunch tray and is in room resting comfortably.  No signs of distress noted. Maintained on 15 minute checks and observation by security camera for safety.

## 2015-08-10 NOTE — ED Notes (Signed)
Patient asked when he would be discharged and asked to see the social worker. Nurse informed patient that a set date for discharge was not made yet, and that she would inform social worker that he would like to meet with her. Patient remains calm and cooperative. Maintained on 15 minute checks and observation by security camera for safety.

## 2015-08-10 NOTE — ED Notes (Signed)
ENVIRONMENTAL ASSESSMENT Potentially harmful objects out of patient reach: Yes Personal belongings secured: Yes Patient dressed in hospital provided attire only: Yes Plastic bags out of patient reach: Yes Patient care equipment (cords, cables, call bells, lines, and drains) shortened, removed, or accounted for: Yes Equipment and supplies removed from bottom of stretcher: Yes Potentially toxic materials out of patient reach: Yes Sharps container removed or out of patient reach: Yes  Patient currently in room sleeping. No signs of distress noted. Maintained on 15 minute checks and observation by security camera for safety.

## 2015-08-11 MED ORDER — TUBERCULIN PPD 5 UNIT/0.1ML ID SOLN
5.0000 [IU] | Freq: Once | INTRADERMAL | Status: AC
  Administered 2015-08-11: 5 [IU] via INTRADERMAL
  Filled 2015-08-11 (×2): qty 0.1

## 2015-08-11 MED ORDER — RISPERIDONE 1 MG PO TABS
1.0000 mg | ORAL_TABLET | Freq: Four times a day (QID) | ORAL | Status: DC | PRN
  Filled 2015-08-11: qty 1

## 2015-08-11 NOTE — Progress Notes (Signed)
LCSW received call from guardian and she is not agreeable to patient being discharged to his mother. Apparently there are safety concerns. LCSW reminded Fayne Norrie ( guardian ) that patient is cleared medically and psychiatrically and is ready for pick up. She reports they have not found a suitable placement as of yet. Guardian then requested Fl2  And TB test. She cant place him until its in. ED nurses ordered TB test.  Patients mother called and stated she was denied to be her own sons caregiver and was very upset. LCSW provided support .  Faxed over [employer]  To Fayne Norrie [phone].  [employer] LCSW [phone]

## 2015-08-11 NOTE — ED Notes (Addendum)
TB skin test given in left forearm. Patient tolerated well with no complaints. Lot # [id number] Exp. Date 09/10/2016

## 2015-08-11 NOTE — ED Notes (Signed)

## 2015-08-11 NOTE — ED Notes (Signed)
Sandwich and milk given

## 2015-08-11 NOTE — ED Notes (Signed)
Pt calm and cooperative. Pt denies pain, SI/HI and AVH. No concerns voiced. Compliant with mediations. Pt currently taking a shower. No distress noted. Maintained on 15 minute checks and observation by security camera for safety.

## 2015-08-11 NOTE — ED Provider Notes (Signed)
-----------------------------------------   6:40 AM on 08/11/2015 -----------------------------------------   Blood pressure 110/69, pulse 64, temperature 98.4 F (36.9 C), temperature source Oral, resp. rate 18, height 5\' 9"  (1.753 m), weight 68.04 kg, SpO2 100 %.  The patient had no acute events since last update.  Calm and cooperative at this time.  Pending social work placement.   Hinda Kehr, MD 08/11/15 [phone]

## 2015-08-11 NOTE — ED Notes (Signed)
Pt. Alert and oriented, warm and dry, in no distress. Pt. Denies SI, HI, and AVH. Pt. Encouraged to let nursing staff know of any concerns or needs. 
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
## 2015-08-11 NOTE — NC FL2 (Signed)
[place] LEVEL OF CARE SCREENING TOOL     IDENTIFICATION  Patient Name: Ryan Zuniga Birthdate: [birth year] Sex: male Admission Date (Current Location): 08/07/2015  [place] and [place] Number:  Engineering geologist and Address:  [facility], [address] [place], [place] [address]      Provider Number: [id number]  Attending Physician Name and Address:  No att. providers found  Relative Name and Phone Number:       Current Level of Care: Hospital Recommended Level of Care: [place] Prior Approval Number:    Date Approved/Denied:   PASRR Number:    Discharge Plan: Other (Comment) (Group Home)    Current Diagnoses: Patient Active Problem List   Diagnosis Date Noted  . Agitation 07/31/2015  . Schizoaffective disorder, bipolar type ([place])   . 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 Tobacco use disorder 03/31/2015  . Borderline personality disorder 03/26/2015  . 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    Orientation RESPIRATION BLADDER Height & Weight     Self, Time, Situation, Place  Normal Continent Weight: 150 lb (68.04 kg) Height:  5\' 9"  (175.3 cm)  BEHAVIORAL SYMPTOMS/MOOD NEUROLOGICAL BOWEL NUTRITION STATUS      Continent Diet (normal)  AMBULATORY STATUS COMMUNICATION OF NEEDS Skin   Independent Verbally Normal                       Personal Care Assistance Level of Assistance  Bathing, Feeding, Dressing, Total care Bathing Assistance: Limited assistance Feeding assistance: Limited assistance Dressing Assistance: Limited assistance Total Care Assistance: Limited assistance   Functional Limitations Info  Sight, Hearing, Speech   Hearing Info: Impaired Speech Info: Impaired    SPECIAL CARE FACTORS FREQUENCY                       Contractures      Additional Factors Info  Allergies   Allergies  Info: asparin, penicillian, sulpha antibiotics  co[place]           Current Medications (08/11/2015):  This is the current hospital active medication list Current Facility-Administered Medications  Medication Dose Route Frequency Provider Last Rate Last Dose  . ARIPiprazole SUSR 400 mg  1 each Intramuscular Q30 days Lisa Roca, MD   400 mg at 08/08/15 1249  . benztropine (COGENTIN) tablet 0.5 mg  0.5 mg Oral BID Lisa Roca, MD   0.5 mg at 08/11/15 0906  . carbamazepine (TEGRETOL) tablet 300 mg  300 mg Oral BID Lisa Roca, MD   300 mg at 08/11/15 0729  . nicotine (NICODERM CQ - dosed in mg/24 hours) patch 21 mg  21 mg Transdermal Daily Lavonia Drafts, MD   21 mg at 08/11/15 0730  . risperiDONE (RISPERDAL) tablet 1 mg  1 mg Oral TID Lisa Roca, MD   1 mg at 08/11/15 1632  . topiramate (TOPAMAX) tablet 100 mg  100 mg Oral BID Lisa Roca, MD   100 mg at 08/11/15 1134  . tuberculin injection 5 Units  5 Units Intradermal Once Orbie Pyo, MD       Current Outpatient Prescriptions  Medication Sig Dispense Refill  . ARIPiprazole (ABILIFY MAINTENA) 400 MG SUSR Inject 1 each into the muscle every 30 (thirty) days.    . benztropine (COGENTIN) 0.5 MG tablet Take 0.5 mg by mouth 2 (two) times daily.    . carbamazepine (TEGRETOL) 200 MG  tablet Take 300 mg by mouth 2 (two) times daily.     . risperiDONE (RISPERDAL) 3 MG tablet Take 1 mg by mouth 3 (three) times daily.     Marland Kitchen topiramate (TOPAMAX) 100 MG tablet Take 100 mg by mouth 2 (two) times daily.       Discharge Medications: Please see discharge summary for a list of discharge medications.  Relevant Imaging Results:  Relevant Lab Results:   Additional Information    Pauletta Browns, [place], [place]

## 2015-08-11 NOTE — ED Notes (Signed)
Pt has had no further outbursts. Remained calm and polite. Pt watching TV in room. No complaints voiced. No distress noted. Maintained on 15 minute checks and observation by security camera for safety.

## 2015-08-11 NOTE — ED Notes (Signed)
Pt became agitated when told he had to wait to use the phone.  He began cursing and slamming doors on the unit. After a couple of minutes pt calmed down and spoke calmly with staff.  Pt apologized to staff for his outburst. Pt now sitting in dayroom watching TV. Maintained on 15 minute checks and observation by security camera for safety.

## 2015-08-11 NOTE — Progress Notes (Signed)
LCSW updated Fl2 and refaxed it to [employer]. Called Tedra Coupe and left her a message that patient is ready for discharge and will need to be picked up.LCSW provided TTS phone number as SW will be away until Friday.   [employer] LCSW [phone]
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
## 2015-08-11 NOTE — Progress Notes (Signed)
LCSW received a call from [place]. She stated that this morning they are having a care conference meeting and will advise the hospital of the outcome. LCSW stated that the mother is willing to take the patient, she is now in her own apartment in [place] and patient would be able to go to [facility]) agreed to support patient. Saurav would live with her.  This plan was disclosed to Guardian who stated this MAY NOT be an option and they would have to review their original findings(Mom may not be able to manage [place]). LCSW awaiting a call back today to discuss plan of care.  [employer] LCSW [phone]

## 2015-08-12 NOTE — ED Notes (Signed)
Patient received breakfast tray. Patient is currently watching television and is calm and cooperative. No signs of distress noted. Maintained on 15 minute checks and observation by security camera for safety.

## 2015-08-12 NOTE — ED Notes (Signed)
Pt stated his outburst from yesterday was related to frustration and he feels much better today

## 2015-08-12 NOTE — ED Notes (Signed)
ENVIRONMENTAL ASSESSMENT Potentially harmful objects out of patient reach: Yes Personal belongings secured: Yes Patient dressed in hospital provided attire only: Yes Plastic bags out of patient reach: Yes Patient care equipment (cords, cables, call bells, lines, and drains) shortened, removed, or accounted for: Yes Equipment and supplies removed from bottom of stretcher: Yes Potentially toxic materials out of patient reach: Yes Sharps container removed or out of patient reach: Yes  Patient currently in room sleeping. No signs of distress noted. Maintained on 15 minute checks and observation by security camera for safety.

## 2015-08-12 NOTE — ED Provider Notes (Signed)
-----------------------------------------   7:13 AM on 08/12/2015 -----------------------------------------   Blood pressure 126/77, pulse 69, temperature 97.6 F (36.4 C), temperature source Other (Comment), resp. rate 16, height 5\' 9"  (1.753 m), weight 150 lb (68.04 kg), SpO2 99 %.  The patient had no acute events since last update.  Calm and cooperative at this time.  Disposition is pending per Psychiatry/Behavioral Medicine team recommendations.     Daymon Larsen, MD 08/12/15 [phone]

## 2015-08-12 NOTE — ED Notes (Signed)
Report was received from Deborah Chalk., RN; Pt. Verbalizes no complaints or distress; denies S.I./Hi. Continue to monitor with 15 min. Monitoring.
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
## 2015-08-13 NOTE — ED Notes (Signed)
PPD IS NEGATIVE
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
## 2015-08-13 NOTE — ED Notes (Signed)
Pt awake offers no c/o or needs

## 2015-08-13 NOTE — Progress Notes (Signed)
Left Voicemail for Ryan Zuniga, DSS guardian [phone], ext. [phone]) to discuss status of placement.  Offered suggestions for [employer] (phone [phone]) who has a current opening.  SW has not heard back from guardian yet, sent out FL-2 to [facility] to [phone] fax.    Confirmed with Ruthie, RN that Pt has been given PPD and it was read and result is negative. She agreed to put in today's progress note as this is difficult to note on MAR in the ER.

## 2015-08-13 NOTE — ED Notes (Signed)
Report was received from Deborah Chalk., RN; Pt. Verbalizes no complaints or distress; denies S.I./Hi. Continue to monitor with 15 min. Monitoring.

## 2015-08-13 NOTE — ED Notes (Signed)
Mother and pt inquiring about update on progress on his case. Social work called

## 2015-08-13 NOTE — ED Notes (Signed)
Explained to pt that social work is still in process of working with guardian about placement

## 2015-08-13 NOTE — ED Provider Notes (Signed)
-----------------------------------------   6:51 AM on 08/13/2015 -----------------------------------------   Blood pressure 119/73, pulse 60, temperature 97.7 F (36.5 C), temperature source Oral, resp. rate 16, height 5\' 9"  (1.753 m), weight 68.04 kg, SpO2 99 %.  The patient had no acute events since last update.  Calm and cooperative at this time.  Pending placement.     Hinda Kehr, MD 08/13/15 [phone]

## 2015-08-14 DIAGNOSIS — F4325 Adjustment disorder with mixed disturbance of emotions and conduct: Secondary | ICD-10-CM | POA: Diagnosis not present

## 2015-08-14 LAB — BASIC METABOLIC PANEL
Anion gap: 7 (ref 5–15)
BUN: 15 mg/dL (ref 6–20)
CO2: 21 mmol/L — ABNORMAL LOW (ref 22–32)
Calcium: 9.3 mg/dL (ref 8.9–10.3)
Chloride: 107 mmol/L (ref 101–111)
Creatinine, Ser: 1.15 mg/dL (ref 0.61–1.24)
GFR calc Af Amer: >60 mL/min (ref >60)
GFR calc non Af Amer: >60 mL/min (ref >60)
Glucose, Bld: 91 mg/dL (ref 65–99)
Potassium: 4.1 mmol/L (ref 3.5–5.1)
Sodium: 135 mmol/L (ref 135–145)

## 2015-08-14 NOTE — ED Provider Notes (Signed)
-----------------------------------------   8:10 AM on 08/14/2015 -----------------------------------------   Blood pressure 107/67, pulse 60, temperature 97.8 F (36.6 C), temperature source Oral, resp. rate 16, height 5\' 9"  (1.753 m), weight 150 lb (68.04 kg), SpO2 100 %.  The patient had no acute events since last update.  Calm and cooperative at this time.  Disposition is pending per Psychiatry/Behavioral Medicine team recommendations.  We will recheck the metabolic panel today since 1 week ago labs showed very slight hyponatremia. We will want to be sure that this is stable.     Carrie Mew, MD 08/14/15 [phone]

## 2015-08-14 NOTE — ED Notes (Signed)
Patient talking on the phone

## 2015-08-14 NOTE — ED Notes (Signed)
Act-Team visitor in to see patient.

## 2015-08-14 NOTE — ED Notes (Signed)
Patient is back in room, patient is calm, no si/hi, camera monitoring in progress for safety.

## 2015-08-14 NOTE — ED Notes (Signed)
Patients breakfast tray given.

## 2015-08-14 NOTE — ED Notes (Signed)
Patient is sitting in dayroom, no issues, calm and cooperative, patient talking about How he is cousins with Joelene Millin , and dale comes to see him often, patient watching tv and talking with other patient. Will continue to monitor, q 15 min checks, and camera monitoring in progress.

## 2015-08-14 NOTE — ED Notes (Signed)
Patient received lunch tray and He said" I'm sorry for how I acted, I had lot on my mind" patient is calm now, denies Si/hi, q 15 min checks and camera monitoring progress.

## 2015-08-14 NOTE — ED Notes (Signed)
ED BHU [place]  Is the patient under IVC or is there intent for IVC: Yes.  Is the patient medically cleared: Yes.  Is there vacancy in the ED BHU: Yes.  Is the population mix appropriate for patient: Yes.  Is the patient awaiting placement in inpatient or outpatient setting: Yes.  Has the patient had a psychiatric consult: Yes.  Survey of unit performed for contraband, proper placement and condition of furniture, tampering with fixtures in bathroom, shower, and each patient room: Yes. ; Findings: Environment secure.  APPEARANCE/BEHAVIOR  calm, cooperative and adequate rapport can be established  NEURO ASSESSMENT  Orientation: time, place and person  Hallucinations: No.None noted (Hallucinations)  Speech: Normal  Gait: normal  RESPIRATORY ASSESSMENT  Normal expansion. Clear to auscultation. No rales, rhonchi, or wheezing., No chest wall tenderness., No kyphosis or scoliosis.  CARDIOVASCULAR ASSESSMENT  regular rate and rhythm, S1, S2 normal, no murmur, click, rub or gallop  GASTROINTESTINAL ASSESSMENT  soft, nontender, BS WNL, no r/g  EXTREMITIES  normal strength, tone, and muscle mass, no deformities, no erythema, induration, or nodules, no evidence of joint effusion, ROM of all joints is normal, no evidence of joint instability  PLAN OF CARE  Provide calm/safe environment. Vital signs assessed twice daily. ED BHU Assessment once each 12-hour shift. Collaborate with intake RN daily or as condition indicates. Assure the ED provider has rounded once each shift. Provide and encourage hygiene. Provide redirection as needed. Assess for escalating behavior; address immediately and inform ED provider.  Assess family dynamic and appropriateness for visitation as needed: No.; If necessary, describe findings: Unable to assess family dynamic since the Pt's family is not present.  Educate the patient/family about BHU procedures/visitation: Yes. ; If necessary, describe findings: Pt  understand the rules of the [place] and agrees to them.
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
## 2015-08-14 NOTE — Progress Notes (Signed)
[facility] has called and informed TTS that the pt has been denied due to previous bx. Representative has stated that she has attempted to contact the guardian. Attempts have been unsuccessful.   08/14/2015 Con Memos, MS, [place], [facility] Therapeutic Triage Specialist

## 2015-08-14 NOTE — ED Notes (Signed)
Patient is sitting in dayroom, He is pleasant, talking with other Patient, no acting out, nurse talked with patient and He states that ' i wish I could go outside" I told him that hopefully placement would be found soon. Patient is pleasant, will continue to monitor.
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
## 2015-08-14 NOTE — ED Notes (Signed)
CMP drawn by Trinna Balloon, lab tech. Patient tolerated without difficulty.

## 2015-08-14 NOTE — ED Notes (Signed)
Patient assigned to appropriate care area. Patient oriented to unit/care area: Informed that, for their safety, care areas are designed for safety and monitored by security cameras at all times; and visiting hours explained to patient. Patient verbalizes understanding, and verbal contract for safety obtained. 

## 2015-08-14 NOTE — ED Notes (Signed)
Patient is oriented, no s/s of distress, denies si/hi, patient is pleasant, and cooperative.

## 2015-08-14 NOTE — ED Notes (Signed)
IVC  RENEWED  ON 4/27  BY  DR  [facility]  PT  PENDING  PLACEMENT

## 2015-08-14 NOTE — ED Notes (Signed)
Report was received from Wendy L., RN; Pt. Verbalizes no complaints or distress; denies S.I./Hi. Continue to monitor with 15 min. Monitoring. 

## 2015-08-15 MED ORDER — TOPIRAMATE 100 MG PO TABS
100.0000 mg | ORAL_TABLET | Freq: Two times a day (BID) | ORAL | Status: DC
  Administered 2015-08-15 – 2015-08-20 (×10): 100 mg via ORAL
  Filled 2015-08-15 (×9): qty 1

## 2015-08-15 MED ORDER — NICOTINE 21 MG/24HR TD PT24
21.0000 mg | MEDICATED_PATCH | Freq: Every day | TRANSDERMAL | Status: DC
Start: 2015-08-16 — End: 2015-08-20
  Administered 2015-08-16 – 2015-08-20 (×5): 21 mg via TRANSDERMAL
  Filled 2015-08-15 (×5): qty 1

## 2015-08-15 MED ORDER — CARBAMAZEPINE 200 MG PO TABS
300.0000 mg | ORAL_TABLET | Freq: Two times a day (BID) | ORAL | Status: DC
  Administered 2015-08-16 – 2015-08-20 (×9): 300 mg via ORAL
  Filled 2015-08-15 (×9): qty 2

## 2015-08-15 MED ORDER — RISPERIDONE 1 MG PO TABS
1.0000 mg | ORAL_TABLET | Freq: Three times a day (TID) | ORAL | Status: DC
  Administered 2015-08-15 – 2015-08-20 (×15): 1 mg via ORAL
  Filled 2015-08-15 (×8): qty 1

## 2015-08-15 MED ORDER — CARBAMAZEPINE 200 MG PO TABS
ORAL_TABLET | ORAL | Status: AC
  Administered 2015-08-15: 300 mg via ORAL
  Filled 2015-08-15: qty 2

## 2015-08-15 MED ORDER — BENZTROPINE MESYLATE 1 MG PO TABS
0.5000 mg | ORAL_TABLET | Freq: Two times a day (BID) | ORAL | Status: DC
  Administered 2015-08-15 – 2015-08-20 (×10): 0.5 mg via ORAL
  Filled 2015-08-15 (×10): qty 1

## 2015-08-15 MED ORDER — RISPERIDONE 1 MG PO TABS
1.0000 mg | ORAL_TABLET | Freq: Four times a day (QID) | ORAL | Status: DC | PRN
  Filled 2015-08-15 (×7): qty 1

## 2015-08-15 NOTE — ED Provider Notes (Signed)
-----------------------------------------   6:51 AM on 08/15/2015 -----------------------------------------   Blood pressure 122/73, pulse 74, temperature 98.1 F (36.7 C), temperature source Oral, resp. rate 18, height 5\' 9"  (1.753 m), weight 150 lb (68.04 kg), SpO2 100 %.  The patient had no acute events since last update.  Calm and cooperative at this time.  Disposition is pending per Psychiatry/Behavioral Medicine team recommendations.     Paulette Blanch, MD 08/15/15 [phone]

## 2015-08-15 NOTE — Progress Notes (Signed)
[place] [phone] - He will contact guardian and make arrangements to see patient, if he feels it is a good fit. Awaiting call back Called Several additional group homes awaiting a call back.  [employer] LCSW [phone]

## 2015-08-15 NOTE — ED Notes (Signed)
Pt is very agitated this morning yelled at me that he did not want his breakfast or meds now, ,he would not answer any questions . He has been here for over a week and is frustrated with being here for so long.

## 2015-08-15 NOTE — Progress Notes (Addendum)
[place] [employer] of SW department- Reviewed case with him- discussed Guardianship concerns/advised him correct guardian information in place in [place]. Called [place] to leave a message at [phone] Ext [phone]/ Recalled and discussed future placements she has none. She will connect with [facility] and ACT Team to see about potential housing. In clarification LCSW has received permission to look for safe housing. It was also explained that patients mother can speak to [place] on the phone but no visits in person until he is settled. LCSW called [place] [phone] It was disclosed the guardian has not requested additional funds. Erline Levine will contact Tedra Coupe ( guardian) to see what her plan is. Enis Slipper LCSW [phone] LCSW called [facility] Marshell [phone] She reported she did speak with Porfirio Mylar on Monday breifly and that no request for additional funds were made.

## 2015-08-15 NOTE — ED Notes (Signed)
Pt is calm and cooperative this evening, lying in bed watching TV. Pt does c/o some restlessness and boredom. Writer provided playing cards and encouraged bathing. Pt did agree to shower and linens were changed as well. Pt is also taking medications as prescribed at this time. Writer explained the importance of mood regulation with medication management, and pt acknowledged understanding. Food and drink were provided, and 15 minute checks are ongoing for safety.
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
## 2015-08-15 NOTE — Progress Notes (Signed)
LCSW called [facility]. LCSW made inquiries about placement for patient. She will contact Renea Ee and have her call me back. [phone] and fax # [phone] Awaiting call back

## 2015-08-15 NOTE — ED Notes (Signed)
Pt agitated refusing all meds,food just yelling i am going home

## 2015-08-15 NOTE — ED Notes (Signed)
Pt refused v/s..

## 2015-08-15 NOTE — ED Notes (Signed)
Remains IVC/Pending Placement

## 2015-08-16 NOTE — ED Notes (Signed)
Pt is awake in bed this evening watching TV. Pt mood is happy and pt reports having a very good day. Pt denies SI/HI and AVH and is taking medications as prescribed. 15 minute checks are ongoing for safety.

## 2015-08-16 NOTE — ED Notes (Signed)
Pt watching TV in dayroom. Calm and cooperative. No concerns voiced. No distress noted. Maintained on 15 minute checks and observation by security camera for safety.

## 2015-08-16 NOTE — ED Notes (Signed)
Pt is calm, alert and oriented on admission to [facility]. Pt denies SI/HI and AVH. Writer oriented pt to the milieu and encouraged rest. 15 minute checks ongoing for safety.

## 2015-08-16 NOTE — ED Notes (Signed)
Pt watching TV in dayroom. Pt compliant with mediations. No concerns voiced. No distress noted. Maintained on 15 minute checks and observation by security camera for safety.

## 2015-08-16 NOTE — ED Notes (Signed)
Pt eating lunch. Has remained calm and cooperative. No concerns voiced. No distress noted. Maintained on 15 minute checks and observation by security camera for safety.

## 2015-08-16 NOTE — Progress Notes (Signed)
LCSW called Fayne Norrie patient guardian and provided her with this workers schedule and contact information for other SW for early next week. Updated patients assessment.  Farra Nikolic LCSW

## 2015-08-16 NOTE — ED Notes (Signed)
Patient resting in day room. No noted distress or abnormal behaviors noted. Will continue 15 minute checks and observation by security camera for safety.

## 2015-08-16 NOTE — ED Notes (Signed)
Pt cooperative and compliant with medications. Pt also apologized for his behavior yesterday. No concerns voiced. No distress noted. Maintained on 15 minute checks and observation by security camera for safety.
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
## 2015-08-16 NOTE — ED Provider Notes (Signed)
-----------------------------------------   6:46 AM on 08/16/2015 -----------------------------------------   Blood pressure 115/66, pulse 68, temperature 98 F (36.7 C), temperature source Oral, resp. rate 18, height 5\' 9"  (1.753 m), weight 150 lb (68.04 kg), SpO2 98 %.  The patient had no acute events since last update.  Calm and cooperative at this time.  Disposition is pending per Psychiatry/Behavioral Medicine team recommendations.     Orbie Pyo, MD 08/16/15 [phone]

## 2015-08-16 NOTE — ED Notes (Signed)
Patient resting quietly in room. No noted distress or abnormal behaviors noted. Will continue 15 minute checks and observation by security camera for safety. 

## 2015-08-16 NOTE — ED Notes (Signed)
Breakfast tray left in pt's room. Pt did not speak. No distress noted. Maintained on 15 minute checks and observation by security camera for safety.

## 2015-08-16 NOTE — Progress Notes (Signed)
LCSW met with patient and reviewed his current housing situation and reviewed with him all the people who are working hard to find him suitable and safe housing. He agreed to remain calm and understood it will take some time.  [employer] LCSW [phone]
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
## 2015-08-17 NOTE — ED Notes (Signed)

## 2015-08-17 NOTE — ED Provider Notes (Signed)
-----------------------------------------   7:03 AM on 08/17/2015 -----------------------------------------   Blood pressure 120/64, pulse 54, temperature 97.6 F (36.4 C), temperature source Oral, resp. rate 18, height 5\' 9"  (1.753 m), weight 150 lb (68.04 kg), SpO2 100 %.  The patient had no acute events since last update.  Calm and cooperative at this time.    The patient has been psychiatrically cleared and is awaiting social work placement.     Loney Hering, MD 08/17/15 [phone]

## 2015-08-17 NOTE — ED Notes (Signed)
Patient doing well in the community. Given positive feedback for his behavior. Maintained on 15 minute checks and observation by security camera for safety.

## 2015-08-17 NOTE — ED Notes (Signed)
Maintained on 15 minute checks and observation by security camera for safety. 
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
## 2015-08-17 NOTE — ED Notes (Signed)
Patient ate breakfast. He was cooperative with taking medications. Currently sitting in the day area, watching television. Offers no complaints.Maintained on 15 minute checks and observation by security camera for safety.

## 2015-08-17 NOTE — ED Notes (Signed)
Patient continues with good behavioral control. Maintained on all safety checks.

## 2015-08-17 NOTE — Progress Notes (Signed)
LCSW called [facility] [employer] and spoke to [facility] [phone]  and explained the patient is discharged. LCSW explained that I was expecting a call on Friday with an update for [employer] and no one returned my calls. LCSW explained I called and left a message for Fayne Norrie  [phone] on Saturday at 2pm and have not received call back. Cassandra Massenburg will make some follow up calls and get back to this worker.   [employer] LCSW [phone]

## 2015-08-17 NOTE — ED Notes (Signed)
Patient in dayroom. Appropriately social with peers and staff. Maintained on 15 minute checks and observation by security camera for safety.

## 2015-08-17 NOTE — ED Notes (Signed)
Patient asleep in room. No noted distress or abnormal behavior. Will continue 15 minute checks and observation by security cameras for safety. 

## 2015-08-17 NOTE — ED Notes (Signed)
Sandwich tray provided

## 2015-08-17 NOTE — Progress Notes (Signed)
>  Dear Vito Backers thank you for responding from [place]. [phone] Weekend emergency Line  Fayne Norrie guardian of our patient ([place] [facility] [address]) she has been repeatedly advised that her patient is discharged since Monday April 24th.We have made several attempts to reach out to [facility] and facilities that might be able to support this patient however resources are limited. We did communicate with patients family until we were advised you were the guardian. His family wanted to take this patient home/continue with out patient psychiatric help ( [facility])and to provide the emotional support ,he might require at this time. You explained that this was not a good plan. Any plan is better than letting your client remain in locked acute psychiatric treatment area especially when the patient has not met any criteria to be placed here,  he has been medically and psychiatrically cleared.We will require immediate action to ensure this unique person's rights are protected and not neglected.  Taneika Choi  LCSW  LCSW informed [facility] of Social Work [employer] and she was brought  In to this complex situation for the patient. LCSW providing coverage will advocate for this patient.

## 2015-08-17 NOTE — ED Notes (Signed)
Patient in dayroom. Social with peers. In good behavioral control. Maintained on all safety checks.

## 2015-08-18 MED ORDER — TOPIRAMATE 100 MG PO TABS
ORAL_TABLET | ORAL | Status: AC
  Filled 2015-08-18: qty 1

## 2015-08-18 NOTE — ED Notes (Signed)
Patient noted sleeping in room. No complaints, stable, in no acute distress. Q15 minute rounds and monitoring via Security Cameras to continue.  

## 2015-08-18 NOTE — ED Notes (Signed)
Pt compliant with 10 am medications. Remains pleasant with staff. No concerns voiced. No distress noted. Maintained on 15 minute checks and observation by security camera for safety.

## 2015-08-18 NOTE — ED Notes (Signed)
Snack and beverage given. 

## 2015-08-18 NOTE — ED Notes (Signed)
Patient resting quietly in room. No noted distress or abnormal behaviors noted. Will continue 15 minute checks and observation by security camera for safety. 

## 2015-08-18 NOTE — ED Notes (Signed)
Pt is alert and oriented this evening watching TV in dayroom. Pt mood is bright and he is pleasant and cooperative with staff. Pt denies SI/HI and AVH. Writer provided food and drink and 15 minute checks are ongoing for safety.

## 2015-08-18 NOTE — ED Notes (Signed)
Report received from [facility] RN. Patient alert and oriented, warm and dry, in no acute distress. Patient denies SI, HI, AVH and pain. Patient made aware of Q15 minute rounds and security cameras for their safety. Patient instructed to come to me with needs or concerns.

## 2015-08-18 NOTE — ED Notes (Signed)
Pt sitting in dayroom watching TV with other patients. Remains calm and cooperative. No concerns voiced. No distress noted. Maintained on 15 minute checks and observation by security camera for safety.

## 2015-08-18 NOTE — ED Notes (Signed)
Pt watching TV in dayroom. No concerns voiced. No distress noted. Maintained on 15 minute checks and observation by security camera for safety.

## 2015-08-18 NOTE — ED Notes (Signed)
Pt watching a movie in dayroom. Calm and cooperative. No distress noted. Maintained on 15 minute checks and observation by security camera for safety.
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
## 2015-08-18 NOTE — ED Notes (Signed)
Pt cooperative and pleasant. Compliant with medications. No concerns voiced. No distress noted. Maintained on 15 minute checks and observation by security camera for safety.

## 2015-08-18 NOTE — ED Provider Notes (Signed)
-----------------------------------------   7:55 AM on 08/18/2015 -----------------------------------------   Blood pressure 120/79, pulse 69, temperature 97.9 F (36.6 C), temperature source Oral, resp. rate 18, height 5\' 9"  (1.753 m), weight 150 lb (68.04 kg), SpO2 100 %.  The patient had no acute events since last update.  Calm and cooperative at this time.  Disposition is pending per Psychiatry/Behavioral Medicine team recommendations.     Paulette Blanch, MD 08/18/15 [phone]

## 2015-08-18 NOTE — ED Notes (Signed)
Patient noted in room. No complaints, stable, in no acute distress. Q15 minute rounds and monitoring via Security Cameras to continue.  

## 2015-08-19 DIAGNOSIS — F4325 Adjustment disorder with mixed disturbance of emotions and conduct: Secondary | ICD-10-CM | POA: Diagnosis not present

## 2015-08-19 NOTE — Progress Notes (Signed)
Spoke to [employer], [place] [phone] is currently out of office) who requested a letter from MD saying that Pt was psychiatrically and physically stable to be safely discharged to the community on 5/1/20117. Sent letter provided by [employer] today. She says she is willing to consider Pt discharge to mother with this letter, but not excited about it as Pt is historically proven to be less stable in the home.  She has arranged for Danae Chen from [employer] staff to interview patient for possible placement at [facility] group home.  SW notified Corrina RN in ED to be aware she would be coming and to allow her to meet patient as Guardian has given consent.  SW followed up with Pt's mother, Ferrel Logan at [phone], just to let her know that no real changes or decisions made yet regarding her son.  She politely requests that we keep her informed of where he goes and how she can contact him if he is placed and that he will always have a home there with her if Guardian will allow.  Spoke with Team lead Colletta [place] [phone]) with [facility].  Made her aware of current status.  She is inclined to prefer group home placement rather than family, as this historically lends more stability to Pt.  She is pursuing a lead with [facility] in [place] and will let us know if possibility for placement there.  Dossie Arbour, MSW, LCSW

## 2015-08-19 NOTE — ED Notes (Signed)
Patient noted in room. No complaints, stable, in no acute distress. Q15 minute rounds and monitoring via Security Cameras to continue.  

## 2015-08-19 NOTE — ED Notes (Signed)
Patient noted sleeping in room. No complaints, stable, in no acute distress. Q15 minute rounds and monitoring via Security Cameras to continue.  

## 2015-08-19 NOTE — ED Notes (Signed)
Patient received a visitor from [facility] stating that he had placement at an assisted living facility. Patient was happy to receive this news. Social worker was notified of visit and social worker number given to Associate Professor. Will await further information from social work.
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
## 2015-08-19 NOTE — ED Notes (Signed)
ENVIRONMENTAL ASSESSMENT Potentially harmful objects out of patient reach: Yes Personal belongings secured: Yes Patient dressed in hospital provided attire only: Yes Plastic bags out of patient reach: Yes Patient care equipment (cords, cables, call bells, lines, and drains) shortened, removed, or accounted for: Yes Equipment and supplies removed from bottom of stretcher: Yes Potentially toxic materials out of patient reach: Yes Sharps container removed or out of patient reach: Yes  Patient is currently in room sleeping. No signs of distress noted. Maintained on 15 minute checks and observation by security camera for safety.

## 2015-08-19 NOTE — ED Notes (Signed)
Patient asleep in room. No noted distress or abnormal behavior. Will continue 15 minute checks and observation by security cameras for safety. 

## 2015-08-19 NOTE — ED Notes (Signed)
Patient received all scheduled medications. No signs of distress noted. Patient maintains bright affect and is calm and cooperative. Maintained on 15 minute checks and observation by security camera for safety.

## 2015-08-19 NOTE — ED Notes (Signed)
[facility]/Pending placement 

## 2015-08-19 NOTE — ED Notes (Signed)
Patient currently in dayroom watching television. No signs of distress noted at this time. Maintained on 15 minute checks and observation by security camera for safety.

## 2015-08-19 NOTE — ED Notes (Signed)
Patient remains in dayroom resting. No signs of acute distress. Maintained on 15 minute checks and observation by security camera for safety.

## 2015-08-19 NOTE — ED Notes (Signed)
Patient currently in room watching TV. No signs of distress noted at this time. Maintained on 15 minute checks and observation by security camera for safety.

## 2015-08-19 NOTE — ED Provider Notes (Signed)
-----------------------------------------   7:22 AM on 08/19/2015 -----------------------------------------   Blood pressure 116/75, pulse 65, temperature 97.7 F (36.5 C), temperature source Oral, resp. rate 16, height 5\' 9"  (1.753 m), weight 150 lb (68.04 kg), SpO2 98 %.  The patient had no acute events since last update.  Calm and cooperative at this time.  Disposition is pending per Psychiatry/Behavioral Medicine team recommendations.     Paulette Blanch, MD 08/19/15 [phone]

## 2015-08-19 NOTE — ED Notes (Signed)
Patient relaxing in dayroom. No signs of acute distress noted. Maintained on 15 minute checks and observation by security camera for safety.

## 2015-08-19 NOTE — ED Notes (Signed)
Patient currently speaking on the phone with mother. No signs of distress noted at this time. Patient remains calm and cooperative. Maintained on 15 minute checks and observation by security camera for safety.

## 2015-08-19 NOTE — ED Notes (Signed)
Report to oncoming nurses, care turned over.

## 2015-08-19 NOTE — ED Notes (Addendum)
Patient denies SI/HI/AVH and pain. Patient denies depression and anxiety and states that he has been doing well even though he has been here for numerous days waiting for placement. Patient is calm and cooperative. No signs of distress noted. Maintained on 15 minute checks and observation by security camera for safety.

## 2015-08-19 NOTE — ED Notes (Signed)
Patient resting quietly in dayroom. No noted distress or abnormal behaviors noted. Will continue 15 minute checks and observation by security camera for safety.

## 2015-08-20 MED ORDER — BENZTROPINE MESYLATE 0.5 MG PO TABS
0.5000 mg | ORAL_TABLET | Freq: Two times a day (BID) | ORAL | Status: DC
Start: 2015-08-20 — End: 2019-08-15

## 2015-08-20 MED ORDER — RISPERIDONE 1 MG PO TABS: 1.0000 mg | ORAL_TABLET | Freq: Three times a day (TID) | ORAL | Status: DC

## 2015-08-20 MED ORDER — ARIPIPRAZOLE ER 400 MG IM SUSR: 1.0000 | INTRAMUSCULAR | Status: DC

## 2015-08-20 MED ORDER — TOPIRAMATE 100 MG PO TABS: 100.0000 mg | ORAL_TABLET | Freq: Two times a day (BID) | ORAL | Status: DC

## 2015-08-20 MED ORDER — CARBAMAZEPINE 200 MG PO TABS: 300.0000 mg | ORAL_TABLET | Freq: Two times a day (BID) | ORAL | Status: DC

## 2015-08-20 NOTE — ED Notes (Signed)
After social worker spoke with patient, patient began to pace in the dayroom. When nurse approached patient he stated that he was waiting to hear more about his discharge plan. Nurse said that she would speak with social work and inform patient of information as it was given. Maintained on 15 minute checks and observation by security camera for safety.

## 2015-08-20 NOTE — ED Notes (Signed)
Patient currently in dayroom speaking on the phone. Patient remains calm and cooperative.  No signs of distress noted at this time. Maintained on 15 minute checks and observation by security camera for safety.

## 2015-08-20 NOTE — ED Notes (Addendum)
Faxed patient's current med list per [employer] instruction to team nursing staff. Now awaiting Colletta [place] to arrive for patient pickup.

## 2015-08-20 NOTE — ED Notes (Signed)
Patient is currently in room watching television. No signs of distress noted. Maintained on 15 minute checks and observation by security camera for safety.

## 2015-08-20 NOTE — ED Provider Notes (Signed)
-----------------------------------------   8:37 AM on 08/20/2015 -----------------------------------------   Blood pressure 114/71, pulse 71, temperature 98.1 F (36.7 C), temperature source Oral, resp. rate 16, height 5\' 9"  (1.753 m), weight 150 lb (68.04 kg), SpO2 99 %.  The patient had no acute events since last update.  Calm and cooperative at this time.    Awaiting social work placement.     Loney Hering, MD 08/20/15 [phone]

## 2015-08-20 NOTE — ED Notes (Signed)
ENVIRONMENTAL ASSESSMENT Potentially harmful objects out of patient reach: Yes Personal belongings secured: Yes Patient dressed in hospital provided attire only: Yes Plastic bags out of patient reach: Yes Patient care equipment (cords, cables, call bells, lines, and drains) shortened, removed, or accounted for: Yes Equipment and supplies removed from bottom of stretcher: Yes Potentially toxic materials out of patient reach: Yes Sharps container removed or out of patient reach: Yes  Patient currently in room sleeping. Maintained on 15 minute checks and observation by security camera for safety.

## 2015-08-20 NOTE — Progress Notes (Signed)
CSW spoke with patient and patient does not want a group home but wants to go home with his mom. Patient reports group homes never work out for him. CSW called guardian Porfirio Mylar [phone] and is contacting [facility] about visiting patient today for a possible group home placement. Derl Barrow will call back but reports Danae Chen from [facility] states she will come meet with patient for assessment.  Carmell Austria, MSW, LCSW [phone]

## 2015-08-20 NOTE — ED Notes (Addendum)
Patient mother called back and said that no one would be available to pick up the patient tomorrow because the patient's father has a funeral to attend. Nurse informed mother that she would received any transportation updates.

## 2015-08-20 NOTE — ED Notes (Signed)
Patient noted in room. No complaints, stable, in no acute distress. Q15 minute rounds and monitoring via Security Cameras to continue.  

## 2015-08-20 NOTE — ED Provider Notes (Signed)
Patient's mother will be taking him home.  Dr. Weber Cooks indicates he feels the patient is stable for ongoing outpatient treatment.  Patient no longer under involuntary commitment.  Filed Vitals:   08/19/15 0631 08/20/15 0639  BP: 116/75 114/71  Pulse: 65 71  Temp: 97.7 F (36.5 C) 98.1 F (36.7 C)  Resp: 16 16     Delman Kitten, MD 08/20/15 1255

## 2015-08-20 NOTE — ED Notes (Signed)
Patient resting quietly in room. No noted distress or abnormal behaviors noted. Will continue 15 minute checks and observation by security camera for safety. 

## 2015-08-20 NOTE — ED Notes (Signed)
Patient in dayroom relaxing. No signs of distress noted. Maintained on 15 minute checks and observation by security camera for safety.

## 2015-08-20 NOTE — ED Notes (Addendum)
Patient denies SI/HI/AVH and pain. Compliant with all scheduled medications. Patient denies symptoms of depression and anxiety. Remains calm and cooperative and has a pleasant and bright affect. No signs of acute distress. Maintained on 15 minute checks and observation by security camera for safety.

## 2015-08-20 NOTE — Discharge Instructions (Signed)
Continue taking your current meds.   See your doctor   Return to ER if he is aggressive to others, thoughts of harming others or himself, hallucinations  Schizoaffective Disorder Schizoaffective disorder (ScAD) is a mental illness. It causes symptoms that are a mixture of schizophrenia (a psychotic disorder) and an affective (mood) disorder. The schizophrenic symptoms may include delusions, hallucinations, or odd behavior. The mood symptoms may be similar to major depression or bipolar disorder. ScAD may interfere with personal relationships or normal daily activities. People with ScAD are at increased risk for job loss, social isolation,physical health problems, anxiety and substance use disorders, and suicide. ScAD usually occurs in cycles. Periods of severe symptoms are followed by periods of less severe symptoms or improvement. The illness affects men and women equally but usually appears at an earlier age (teenage or early adult years) in men. People who have family members with schizophrenia, bipolar disorder, or ScAD are at higher risk of developing ScAD. SYMPTOMS  At any one time, people with ScAD may have psychotic symptoms only or both psychotic and mood symptoms. The psychotic symptoms include one or more of the following:  Hearing, seeing, or feeling things that are not there (hallucinations).   Having fixed, false beliefs (delusions). The delusions usually are of being attacked, harassed, cheated, persecuted, or conspired against (paranoid delusions).  Speaking in a way that makes no sense to others (disorganized speech). The psychotic symptoms of ScAD may also include confusing or odd behavior or any of the negative symptoms of schizophrenia. These include loss of motivation for normal daily activities, such as bathing or grooming, withdrawal from other people, and lack of emotions.  The mood symptoms of ScAD occur more often than not. They resemble major depressive disorder or  bipolar mania. Symptoms of major depression include depressed mood and four or more of the following:  Loss of interest in usually pleasurable activities (anhedonia).  Sleeping more or less than normal.  Feeling worthless or excessively guilty.  Lack of energy or motivation.  Trouble concentrating.  Eating more or less than usual.  Thinking a lot about death or suicide. Symptoms of bipolar mania include abnormally elevated or irritable mood and increased energy or activity, plus three or more of the following:   More confidence than normal or feeling that you are able to do anything (grandiosity).  Feeling rested with less sleep than normal.   Being easily distracted.   Talking more than usual or feeling pressured to keep talking.   Feeling that your thoughts are racing.  Engaging in high-risk activities such as buying sprees or foolish business decisions. DIAGNOSIS  ScAD is diagnosed through an assessment by your health care provider. Your health care provider will observe and ask questions about your thoughts, behavior, mood, and ability to function in daily life. Your health care provider may also ask questions about your medical history and use of drugs, including prescription medicines. Your health care provider may also order blood tests and imaging exams. Certain medical conditions and substances can cause symptoms that resemble ScAD. Your health care provider may refer you to a mental health specialist for evaluation.  ScAD is divided into two types. The depressive type is diagnosed if your mood symptoms are limited to major depression. The bipolar type is diagnosed if your mood symptoms are manic or a mixture of manic and depressive symptoms TREATMENT  ScAD is usually a lifelong illness. Long-term treatment is necessary. The following treatments are available:  Medicine. Different types of  medicine are used to treat ScAD. The exact combination depends on the type and  severity of your symptoms. Antipsychotic medicine is used to control psychotic symptomssuch as delusions, paranoia, and hallucinations. Mood stabilizers can even the highs and lows of bipolar manic mood swings. Antidepressant medicines are used to treat major depressive symptoms.  Counseling or talk therapy. Individual, group, or family counseling may be helpful in providing education, support, and guidance. Many people with ScAD also benefit from social skills and job skills (vocational) training. A combination of medicine and counseling is usually best for managing the disorder over time. A procedure in which electricity is applied to the brain through the scalp (electroconvulsive therapy) may be used to treat people with severe manic symptoms that do not respond to medicine and counseling. HOME CARE INSTRUCTIONS   Take all your medicine as prescribed.  Check with your health care provider before starting new prescription or over-the-counter medicines.  Keep all follow up appointments with your health care provider. SEEK MEDICAL CARE IF:   If you are not able to take your medicines as prescribed.  If your symptoms get worse. SEEK IMMEDIATE MEDICAL CARE IF:   You have serious thoughts about hurting yourself or others.   This information is not intended to replace advice given to you by your health care provider. Make sure you discuss any questions you have with your health care provider.   Document Released: 08/16/2006 Document Revised: 04/26/2014 Document Reviewed: 11/17/2012 Elsevier Interactive Patient Education [employer].

## 2015-08-20 NOTE — ED Notes (Signed)
Patient denies SI/HI/AVH and pain. All belongings returned to patient. Discharge instructions reviewed. Patient picked up by [facility] representative.
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
## 2015-08-20 NOTE — ED Notes (Signed)
Informed TSS papers expire at noon 5/3

## 2015-08-20 NOTE — ED Notes (Addendum)
Nurse spoke with Colletta Maryland [phone]) and informed her of patient being cleared for discharge from the hospital. Nurse asked if they could arrange transportation for the patient. Colletta Maryland stated that she would call nurse back with more information. Will await call back.

## 2015-08-20 NOTE — ED Notes (Signed)
Social worker informed nurse that patient could be cleared for discharge to home with mother per guardian. Mother then called and stated that she and patient's dad would not be able to pick the patient up today because they were too busy and that they could pick him up first thing in the morning. Nurse stated that she would work with social work to find an alternate safe  transportation method. Will keep mother informed of plan.

## 2015-08-20 NOTE — ED Notes (Signed)
Informed Ryan Zuniga in Intake that papers expired at [place] said he would inform Dr. Weber Cooks

## 2015-08-20 NOTE — ED Notes (Signed)
Patient currently talking on the phone. No signs of distress noted at this time. Maintained on 15 minute checks and observation by security camera for safety.

## 2015-08-20 NOTE — ED Notes (Signed)
Nurse spoke with Colletta Maryland ACT team member on the phone who stated that if patient's mother called her with address then she would be able to pick him up from the hospital. Nurse worked closely with social worker and Colletta Maryland was given mother's number so that she could receive the address. Will await call from Colletta Maryland saying that she is en route to pick up patient.

## 2015-11-04 DIAGNOSIS — Q860 Fetal alcohol syndrome (dysmorphic): Secondary | ICD-10-CM | POA: Insufficient documentation

## 2015-12-30 DIAGNOSIS — F1210 Cannabis abuse, uncomplicated: Secondary | ICD-10-CM | POA: Insufficient documentation

## 2016-05-27 DIAGNOSIS — S8251XD Displaced fracture of medial malleolus of right tibia, subsequent encounter for closed fracture with routine healing: Secondary | ICD-10-CM | POA: Insufficient documentation

## 2016-09-23 DIAGNOSIS — K2210 Ulcer of esophagus without bleeding: Secondary | ICD-10-CM | POA: Insufficient documentation

## 2019-08-14 ENCOUNTER — Emergency Department
Admission: EM | Admit: 2019-08-14 | Discharge: 2019-08-15 | Disposition: A | Discharge status: Home / Self Care | Payer: Medicaid Other | Attending: Emergency Medicine | Admitting: Emergency Medicine

## 2019-08-14 ENCOUNTER — Encounter: Payer: Self-pay | Admitting: *Deleted

## 2019-08-14 ENCOUNTER — Other Ambulatory Visit: Payer: Self-pay

## 2019-08-14 DIAGNOSIS — F603 Borderline personality disorder: Secondary | ICD-10-CM | POA: Diagnosis present

## 2019-08-14 DIAGNOSIS — F259 Schizoaffective disorder, unspecified: Secondary | ICD-10-CM | POA: Diagnosis not present

## 2019-08-14 DIAGNOSIS — Z046 Encounter for general psychiatric examination, requested by authority: Secondary | ICD-10-CM | POA: Diagnosis present

## 2019-08-14 DIAGNOSIS — F17200 Nicotine dependence, unspecified, uncomplicated: Secondary | ICD-10-CM | POA: Diagnosis present

## 2019-08-14 DIAGNOSIS — F809 Developmental disorder of speech and language, unspecified: Secondary | ICD-10-CM

## 2019-08-14 DIAGNOSIS — R451 Restlessness and agitation: Secondary | ICD-10-CM | POA: Diagnosis present

## 2019-08-14 DIAGNOSIS — F1210 Cannabis abuse, uncomplicated: Secondary | ICD-10-CM | POA: Insufficient documentation

## 2019-08-14 DIAGNOSIS — F4325 Adjustment disorder with mixed disturbance of emotions and conduct: Secondary | ICD-10-CM | POA: Diagnosis present

## 2019-08-14 DIAGNOSIS — R45850 Homicidal ideations: Secondary | ICD-10-CM | POA: Diagnosis not present

## 2019-08-14 DIAGNOSIS — F849 Pervasive developmental disorder, unspecified: Secondary | ICD-10-CM

## 2019-08-14 DIAGNOSIS — F250 Schizoaffective disorder, bipolar type: Secondary | ICD-10-CM | POA: Diagnosis present

## 2019-08-14 LAB — ETHANOL: Alcohol, Ethyl (B): <10 mg/dL (ref <10)

## 2019-08-14 LAB — CBC
HCT: 47.6 % (ref 39.0–52.0)
Hemoglobin: 17.2 g/dL — ABNORMAL HIGH (ref 13.0–17.0)
MCH: 35 pg — ABNORMAL HIGH (ref 26.0–34.0)
MCHC: 36.1 g/dL — ABNORMAL HIGH (ref 30.0–36.0)
MCV: 96.9 fL (ref 80.0–100.0)
Platelets: 268 10*3/uL (ref 150–400)
RBC: 4.91 MIL/uL (ref 4.22–5.81)
RDW: 11.8 % (ref 11.5–15.5)
WBC: 8.6 10*3/uL (ref 4.0–10.5)
nRBC: 0 % (ref 0.0–0.2)

## 2019-08-14 LAB — URINE DRUG SCREEN, QUALITATIVE (ARMC ONLY)
Amphetamines, Ur Screen: NOT DETECTED
Barbiturates, Ur Screen: NOT DETECTED
Benzodiazepine, Ur Scrn: NOT DETECTED
Cannabinoid 50 Ng, Ur ~~LOC~~: NOT DETECTED
Cocaine Metabolite,Ur ~~LOC~~: NOT DETECTED
MDMA (Ecstasy)Ur Screen: NOT DETECTED
Methadone Scn, Ur: NOT DETECTED
Opiate, Ur Screen: NOT DETECTED
Phencyclidine (PCP) Ur S: NOT DETECTED
Tricyclic, Ur Screen: NOT DETECTED

## 2019-08-14 LAB — COMPREHENSIVE METABOLIC PANEL
ALT: 17 U/L (ref 0–44)
AST: 20 U/L (ref 15–41)
Albumin: 4.9 g/dL (ref 3.5–5.0)
Alkaline Phosphatase: 73 U/L (ref 38–126)
Anion gap: 14 (ref 5–15)
BUN: 8 mg/dL (ref 6–20)
CO2: 19 mmol/L — ABNORMAL LOW (ref 22–32)
Calcium: 9.8 mg/dL (ref 8.9–10.3)
Chloride: 97 mmol/L — ABNORMAL LOW (ref 98–111)
Creatinine, Ser: 1.03 mg/dL (ref 0.61–1.24)
GFR calc Af Amer: >60 mL/min (ref >60)
GFR calc non Af Amer: >60 mL/min (ref >60)
Glucose, Bld: 84 mg/dL (ref 70–99)
Potassium: 3.7 mmol/L (ref 3.5–5.1)
Sodium: 130 mmol/L — ABNORMAL LOW (ref 135–145)
Total Bilirubin: 1.3 mg/dL — ABNORMAL HIGH (ref 0.3–1.2)
Total Protein: 7.9 g/dL (ref 6.5–8.1)

## 2019-08-14 LAB — SALICYLATE LEVEL: Salicylate Lvl: <7 mg/dL — ABNORMAL LOW (ref 7.0–30.0)

## 2019-08-14 LAB — ACETAMINOPHEN LEVEL: Acetaminophen (Tylenol), Serum: <10 ug/mL — ABNORMAL LOW (ref 10–30)
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
## 2019-08-14 NOTE — ED Notes (Signed)
Silver color bracelet , gold color chain, cross necklace Tennis shoes White socks Jeans, Black tee shirt Phone Charger,coffee Nail clippers Lighter 4 packs cigs Dirty underwear
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
## 2019-08-14 NOTE — ED Notes (Signed)
TTS at bedside for pt evaluation

## 2019-08-15 NOTE — ED Notes (Signed)
Pt asleep, breakfast tray placed on bed.

## 2019-08-15 NOTE — BH Assessment (Signed)
TTS contacted legal guradinan after receiving confirmation from Pt [facility], [phone]) but was unable to leave a voicemail. TTS contacted the crisis number provided on [name]'s voicemail [phone]) and left a HIPPA compliant voicemail.
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
## 2019-08-15 NOTE — ED Notes (Signed)
Spoke with Edd Arbour to let him know the pt is ready for discharge. He says he'll get someone to come pick him up.

## 2019-08-15 NOTE — ED Notes (Signed)
Pt given all his personal belongings. Discharge teaching done and pt verbalized understanding. Discharged to the care of [name] [facility] staff.

## 2019-08-15 NOTE — ED Notes (Signed)
Pt was dressed into personal clothes without knowledge that the guardian had not been reached. Pt re-dressed back into hospital provided scrubs.

## 2019-08-15 NOTE — Clinical Social Work Note (Signed)
TOC received consult for assistance contacting patient's legal guardian, [name], so patient can return to group home. CSW attempted to call [name], but was unable to reach her. CSW left voicemail requesting call back.  Tobi Bastos, LCSW Transitions of Care Clinical Social Worker [facility] Ph: [phone]

## 2019-08-15 NOTE — Progress Notes (Signed)
Case reviewed with TTS.  Patient patient ID: Ryan Zuniga, male   DOB: Sep 18, 1980, 39 y.o.   MRN: [id number] No longer requires hospital level treatment.  Reviewed the case with the TTS staff.  Patient is to be discharged but so far guardian has not been available.  I have asked social work if they could assist with that.

## 2019-08-15 NOTE — ED Provider Notes (Signed)
The patient has been evaluated at bedside by Dr. Satira Sark of psychiatry.  Patient is clinically stable.  Not felt to be a danger to self or others.  No SI or Hi.  No indication for inpatient psychiatric admission at this time.  Appropriate for continued outpatient therapy.    [name], MD 08/15/19 [phone]

## 2019-08-15 NOTE — ED Notes (Signed)
Pt up to restroom with steady gait.
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
## 2019-08-15 NOTE — ED Notes (Signed)
Spoke with Ryan Zuniga [phone] and she reports she is Ms [employer] developer. She reports she will take guardianship over pt and to go ahead and discharge pt to the care of [name], group home staff. TTS and MD notified.
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
## 2019-10-29 ENCOUNTER — Emergency Department
Admission: EM | Admit: 2019-10-29 | Discharge: 2019-10-31 | Disposition: A | Discharge status: Home / Self Care | Payer: Medicaid Other | Attending: Emergency Medicine | Admitting: Emergency Medicine

## 2019-10-29 ENCOUNTER — Other Ambulatory Visit: Payer: Self-pay

## 2019-10-29 DIAGNOSIS — Z20822 Contact with and (suspected) exposure to covid-19: Secondary | ICD-10-CM | POA: Diagnosis not present

## 2019-10-29 DIAGNOSIS — F840 Autistic disorder: Secondary | ICD-10-CM | POA: Insufficient documentation

## 2019-10-29 DIAGNOSIS — F603 Borderline personality disorder: Secondary | ICD-10-CM | POA: Diagnosis present

## 2019-10-29 DIAGNOSIS — R451 Restlessness and agitation: Secondary | ICD-10-CM | POA: Diagnosis not present

## 2019-10-29 DIAGNOSIS — F17200 Nicotine dependence, unspecified, uncomplicated: Secondary | ICD-10-CM | POA: Diagnosis present

## 2019-10-29 DIAGNOSIS — R45850 Homicidal ideations: Secondary | ICD-10-CM | POA: Insufficient documentation

## 2019-10-29 DIAGNOSIS — R45851 Suicidal ideations: Secondary | ICD-10-CM | POA: Insufficient documentation

## 2019-10-29 DIAGNOSIS — F17210 Nicotine dependence, cigarettes, uncomplicated: Secondary | ICD-10-CM | POA: Diagnosis not present

## 2019-10-29 DIAGNOSIS — F250 Schizoaffective disorder, bipolar type: Secondary | ICD-10-CM | POA: Insufficient documentation

## 2019-10-29 DIAGNOSIS — F4325 Adjustment disorder with mixed disturbance of emotions and conduct: Secondary | ICD-10-CM | POA: Diagnosis not present

## 2019-10-29 DIAGNOSIS — F849 Pervasive developmental disorder, unspecified: Secondary | ICD-10-CM

## 2019-10-29 LAB — SALICYLATE LEVEL: Salicylate Lvl: <7 mg/dL — ABNORMAL LOW (ref 7.0–30.0)

## 2019-10-29 LAB — COMPREHENSIVE METABOLIC PANEL
ALT: 16 U/L (ref 0–44)
AST: 19 U/L (ref 15–41)
Albumin: 4.9 g/dL (ref 3.5–5.0)
Alkaline Phosphatase: 67 U/L (ref 38–126)
Anion gap: 11 (ref 5–15)
BUN: 6 mg/dL (ref 6–20)
CO2: 24 mmol/L (ref 22–32)
Calcium: 9.6 mg/dL (ref 8.9–10.3)
Chloride: 101 mmol/L (ref 98–111)
Creatinine, Ser: 1.1 mg/dL (ref 0.61–1.24)
GFR calc Af Amer: >60 mL/min (ref >60)
GFR calc non Af Amer: >60 mL/min (ref >60)
Glucose, Bld: 83 mg/dL (ref 70–99)
Potassium: 3.7 mmol/L (ref 3.5–5.1)
Sodium: 136 mmol/L (ref 135–145)
Total Bilirubin: 1 mg/dL (ref 0.3–1.2)
Total Protein: 7.9 g/dL (ref 6.5–8.1)

## 2019-10-29 LAB — ETHANOL: Alcohol, Ethyl (B): <10 mg/dL (ref <10)

## 2019-10-29 LAB — URINE DRUG SCREEN, QUALITATIVE (ARMC ONLY)
Amphetamines, Ur Screen: NOT DETECTED
Barbiturates, Ur Screen: NOT DETECTED
Benzodiazepine, Ur Scrn: NOT DETECTED
Cannabinoid 50 Ng, Ur ~~LOC~~: NOT DETECTED
Cocaine Metabolite,Ur ~~LOC~~: NOT DETECTED
MDMA (Ecstasy)Ur Screen: NOT DETECTED
Methadone Scn, Ur: NOT DETECTED
Opiate, Ur Screen: NOT DETECTED
Phencyclidine (PCP) Ur S: NOT DETECTED
Tricyclic, Ur Screen: NOT DETECTED

## 2019-10-29 LAB — CBC
HCT: 47.1 % (ref 39.0–52.0)
Hemoglobin: 16.3 g/dL (ref 13.0–17.0)
MCH: 33.7 pg (ref 26.0–34.0)
MCHC: 34.6 g/dL (ref 30.0–36.0)
MCV: 97.3 fL (ref 80.0–100.0)
Platelets: 299 10*3/uL (ref 150–400)
RBC: 4.84 MIL/uL (ref 4.22–5.81)
RDW: 12.5 % (ref 11.5–15.5)
WBC: 9 10*3/uL (ref 4.0–10.5)
nRBC: 0 % (ref 0.0–0.2)

## 2019-10-29 LAB — ACETAMINOPHEN LEVEL: Acetaminophen (Tylenol), Serum: <10 ug/mL — ABNORMAL LOW (ref 10–30)

## 2019-10-29 NOTE — ED Notes (Signed)
Pt legal guardian called, sent to crisis center line and [name] gives consent for emergency treatment to this RN via phone.   Red bandana, jeans, black tennis shoes, belt, blue shirt white socks, black underwear placed in pt belonging bag labeled with correct name.
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
## 2019-10-29 NOTE — ED Triage Notes (Signed)
First Nurse Note:  Arrives with [facility] Sheriff's Deputy under IVC paperwork from [place].  Patient is calm and cooperative.  NAD

## 2019-10-29 NOTE — ED Notes (Signed)

## 2019-10-29 NOTE — ED Notes (Signed)
Pt. Alert and oriented, warm and dry, in no distress. Pt. Denies  AVH. Patient states having SI thought and HI thoughts toward other residents at group home. Patient contacts for safety with this Probation officer.  Pt. Encouraged to let nursing staff know of any concerns or needs.
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
## 2019-10-30 LAB — SARS CORONAVIRUS 2 BY RT PCR (HOSPITAL ORDER, PERFORMED IN ~~LOC~~ HOSPITAL LAB): SARS Coronavirus 2: NEGATIVE

## 2019-10-30 MED ORDER — HYDROXYZINE PAMOATE 50 MG PO CAPS
50.0000 mg | ORAL_CAPSULE | Freq: Two times a day (BID) | ORAL | Status: DC | PRN
  Filled 2019-10-30: qty 1

## 2019-10-30 MED ORDER — BENZTROPINE MESYLATE 1 MG PO TABS
2.0000 mg | ORAL_TABLET | Freq: Two times a day (BID) | ORAL | Status: DC
  Administered 2019-10-30 – 2019-10-31 (×2): 2 mg via ORAL
  Filled 2019-10-30 (×2): qty 2

## 2019-10-30 MED ORDER — MELATONIN 5 MG PO TABS
5.0000 mg | ORAL_TABLET | Freq: Every day | ORAL | Status: DC
  Administered 2019-10-30: 5 mg via ORAL
  Filled 2019-10-30: qty 1

## 2019-10-30 MED ORDER — MIRTAZAPINE 15 MG PO TABS: 15.0000 mg | ORAL_TABLET | Freq: Every day | ORAL | Status: DC

## 2019-10-30 MED ORDER — LAMOTRIGINE 100 MG PO TABS
150.0000 mg | ORAL_TABLET | Freq: Every day | ORAL | Status: DC
  Administered 2019-10-30 – 2019-10-31 (×2): 150 mg via ORAL
  Filled 2019-10-30 (×2): qty 2

## 2019-10-30 MED ORDER — SERTRALINE HCL 50 MG PO TABS
25.0000 mg | ORAL_TABLET | Freq: Every day | ORAL | Status: DC
  Administered 2019-10-30 – 2019-10-31 (×2): 25 mg via ORAL
  Filled 2019-10-30 (×2): qty 1

## 2019-10-30 MED ORDER — FOLIC ACID 1 MG PO TABS
1.0000 mg | ORAL_TABLET | Freq: Every day | ORAL | Status: DC
  Administered 2019-10-30 – 2019-10-31 (×2): 1 mg via ORAL
  Filled 2019-10-30 (×2): qty 1

## 2019-10-30 MED ORDER — RISPERIDONE 3 MG PO TABS: 6.0000 mg | ORAL_TABLET | Freq: Two times a day (BID) | ORAL | Status: DC

## 2019-10-30 MED ORDER — MIRTAZAPINE 15 MG PO TABS
30.0000 mg | ORAL_TABLET | Freq: Every day | ORAL | Status: DC
  Administered 2019-10-30: 30 mg via ORAL
  Filled 2019-10-30: qty 2

## 2019-10-30 MED ORDER — PALIPERIDONE ER 3 MG PO TB24
9.0000 mg | ORAL_TABLET | Freq: Every day | ORAL | Status: DC
  Administered 2019-10-30: 9 mg via ORAL
  Filled 2019-10-30: qty 3

## 2019-10-30 MED ORDER — LAMOTRIGINE 100 MG PO TABS: 100.0000 mg | ORAL_TABLET | Freq: Every day | ORAL | Status: DC
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
## 2019-10-30 NOTE — ED Notes (Signed)
Pt brought into ED BHU via sally port and wand with metal detector for safety by [place] Security officer. Patient oriented to unit/care area: Pt informed of unit policies and procedures.  Informed that, for their safety, care areas are designed for safety and monitored by security cameras at all times. Patient verbalizes understanding, and verbal contract for safety obtained.Pt shown to their room.  

## 2019-10-30 NOTE — BH Assessment (Signed)
TTS completed reassessment. Pt presented calm, pleasant and oriented x 3. Pt reports to feel ok and to be unsure if he can return back to his current [facility]. Pt expressed feeling his current [facility] is not a good match for him due to the ongoing conflict with other residents. Pt currently endorses HI stating "I want to cut the guys in my group home with a knife". Pt denies any current SI/AH/VH and was unable to contract for safety.   Pt final disposition is pending psych reassessment.

## 2019-10-30 NOTE — ED Notes (Signed)
Pt denies SI/AVH but endorses HI toward the people at the [facility]. Pt cannot say why other that they are bad people and don't treat him right. Awaiting psych evaluation.
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
## 2019-10-30 NOTE — ED Notes (Signed)
IVC/  PENDING  REASSESSMENT

## 2019-10-31 NOTE — ED Notes (Signed)
Patient offered shower afer breakfast this morning. Patient refused and stated maybe later today.

## 2019-10-31 NOTE — ED Notes (Signed)
Call received from TTS was able to get in touch with guardian. Guardian will be here about 130 to pick patient up, will park in ed waiting room area and advise nurse of their arrival so patient can be brought out side.

## 2019-10-31 NOTE — ED Notes (Signed)
Guardian called outside waiting for patient

## 2019-10-31 NOTE — Final Progress Note (Signed)
Physician Final Progress Note  Patient ID: Ryan Zuniga MRN: [id number] DOB/AGE: June 16, 1980 39 y.o.  Admit date: 10/29/2019 Admitting provider: No admitting provider for patient encounter. Discharge date: 10/31/2019   Admission Diagnoses:    Adjustment disorder mixed emotions and conduct   Schizoaffective disorder bipolar disorder  IDD  Speech articulation disorder   Discharge Diagnoses:   e   Consults:   TTS  Psych MD  ER MD Significant Findings/ Diagnostic Studies:   None   Procedures:   none  Discharge Condition:   Disposition: as tolerated   Diet:  as tolerated  Patient discharged back to group home in stable condition, without major medical changes\Contracted for safety overcame his adjustment issues,  After arguments and discord at group home   Alert cooperative oriented times four Not clouded of fluctuant  Mood normal Affect okay  Chronic speech articulation issues   No active SI HI or plans     Total time spent taking care of this patient: 20-30 minutes  Signed: Eulas Post 10/31/2019, 1:05 PM

## 2019-10-31 NOTE — BH Assessment (Signed)
Spoke with group home staff [name]) about arranging transportation. [name] reported that she could pick up the pt. In approximately 45 minutes. ED staff has been notified.

## 2020-01-10 ENCOUNTER — Emergency Department
Admission: EM | Admit: 2020-01-10 | Discharge: 2020-01-11 | Disposition: A | Discharge status: Other Acute Inpatient Hospital | Payer: Medicaid Other | Attending: Emergency Medicine | Admitting: Emergency Medicine

## 2020-01-10 ENCOUNTER — Other Ambulatory Visit: Payer: Self-pay

## 2020-01-10 ENCOUNTER — Emergency Department
Admission: EM | Admit: 2020-01-10 | Discharge: 2020-01-10 | Discharge status: Against Medical Advice | Payer: Medicaid Other

## 2020-01-10 DIAGNOSIS — F250 Schizoaffective disorder, bipolar type: Secondary | ICD-10-CM | POA: Diagnosis present

## 2020-01-10 DIAGNOSIS — Z20822 Contact with and (suspected) exposure to covid-19: Secondary | ICD-10-CM | POA: Insufficient documentation

## 2020-01-10 DIAGNOSIS — F989 Unspecified behavioral and emotional disorders with onset usually occurring in childhood and adolescence: Secondary | ICD-10-CM | POA: Insufficient documentation

## 2020-01-10 DIAGNOSIS — F17210 Nicotine dependence, cigarettes, uncomplicated: Secondary | ICD-10-CM | POA: Diagnosis not present

## 2020-01-10 DIAGNOSIS — R259 Unspecified abnormal involuntary movements: Secondary | ICD-10-CM | POA: Diagnosis not present

## 2020-01-10 DIAGNOSIS — R4689 Other symptoms and signs involving appearance and behavior: Secondary | ICD-10-CM

## 2020-01-10 LAB — RESPIRATORY PANEL BY RT PCR (FLU A&B, COVID)
Influenza A by PCR: NEGATIVE
Influenza B by PCR: NEGATIVE
SARS Coronavirus 2 by RT PCR: NEGATIVE

## 2020-01-10 LAB — URINALYSIS, COMPLETE (UACMP) WITH MICROSCOPIC
Bilirubin Urine: NEGATIVE
Glucose, UA: NEGATIVE mg/dL
Hgb urine dipstick: NEGATIVE
Ketones, ur: 5 mg/dL — AB
Leukocytes,Ua: NEGATIVE
Nitrite: NEGATIVE
Protein, ur: NEGATIVE mg/dL
Specific Gravity, Urine: 1.001 — ABNORMAL LOW (ref 1.005–1.030)
Squamous Epithelial / HPF: NONE SEEN (ref 0–5)
pH: 6 (ref 5.0–8.0)

## 2020-01-10 LAB — COMPREHENSIVE METABOLIC PANEL
ALT: 15 U/L (ref 0–44)
AST: 17 U/L (ref 15–41)
Albumin: 5 g/dL (ref 3.5–5.0)
Alkaline Phosphatase: 66 U/L (ref 38–126)
Anion gap: 11 (ref 5–15)
BUN: 7 mg/dL (ref 6–20)
CO2: 24 mmol/L (ref 22–32)
Calcium: 9.7 mg/dL (ref 8.9–10.3)
Chloride: 98 mmol/L (ref 98–111)
Creatinine, Ser: 0.89 mg/dL (ref 0.61–1.24)
GFR calc Af Amer: >60 mL/min (ref >60)
GFR calc non Af Amer: >60 mL/min (ref >60)
Glucose, Bld: 92 mg/dL (ref 70–99)
Potassium: 3.9 mmol/L (ref 3.5–5.1)
Sodium: 133 mmol/L — ABNORMAL LOW (ref 135–145)
Total Bilirubin: 0.9 mg/dL (ref 0.3–1.2)
Total Protein: 7.8 g/dL (ref 6.5–8.1)

## 2020-01-10 LAB — URINE DRUG SCREEN, QUALITATIVE (ARMC ONLY)
Amphetamines, Ur Screen: NOT DETECTED
Barbiturates, Ur Screen: NOT DETECTED
Benzodiazepine, Ur Scrn: NOT DETECTED
Cannabinoid 50 Ng, Ur ~~LOC~~: NOT DETECTED
Cocaine Metabolite,Ur ~~LOC~~: NOT DETECTED
MDMA (Ecstasy)Ur Screen: NOT DETECTED
Methadone Scn, Ur: NOT DETECTED
Opiate, Ur Screen: NOT DETECTED
Phencyclidine (PCP) Ur S: NOT DETECTED
Tricyclic, Ur Screen: NOT DETECTED

## 2020-01-10 LAB — ETHANOL: Alcohol, Ethyl (B): <10 mg/dL (ref <10)

## 2020-01-10 LAB — CBC
HCT: 47.4 % (ref 39.0–52.0)
Hemoglobin: 16.5 g/dL (ref 13.0–17.0)
MCH: 34.2 pg — ABNORMAL HIGH (ref 26.0–34.0)
MCHC: 34.8 g/dL (ref 30.0–36.0)
MCV: 98.3 fL (ref 80.0–100.0)
Platelets: 252 10*3/uL (ref 150–400)
RBC: 4.82 MIL/uL (ref 4.22–5.81)
RDW: 12.2 % (ref 11.5–15.5)
WBC: 9.8 10*3/uL (ref 4.0–10.5)
nRBC: 0 % (ref 0.0–0.2)

## 2020-01-10 NOTE — BH Assessment (Addendum)
At 11:03 PM this writer attempted to contact pt's legal guardian Ryan Zuniga [phone])  for collateral but was unable to get through. A HIPPA compliant message was left, requesting a return phone call.

## 2020-01-10 NOTE — ED Notes (Signed)
Pt given blanket and pillow. Pt resting and comfortable at this time.

## 2020-01-10 NOTE — ED Notes (Signed)
Hourly rounding reveals patient in room. No complaints, stable, in no acute distress. Q15 minute rounds and monitoring via [place] and Officer to continue.   

## 2020-01-10 NOTE — ED Triage Notes (Signed)
This RN called [facility] to get information regarding pt legal guardian and was advised that pt was back there and safe
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
## 2020-01-10 NOTE — ED Triage Notes (Signed)
This RN attempted to call [phone], no answer. Not able to locate pt. Paper work sent with patient does not have a contact number or person listed.

## 2020-01-10 NOTE — ED Triage Notes (Signed)
Unable to locate pt for triage. Attempted to call numbers listed. Number for [place] is not longer the correct number. The person answered said it was a private residence.  Attempted to call the number for legal guardian listed but the number is no longer a working number

## 2020-01-10 NOTE — ED Notes (Signed)
Pt. Transferred from Triage to room 19H after dressing out and screening for contraband. Report to include Situation, Background, Assessment and Recommendations from [place]. Pt. Oriented to [place] including Q15 minute rounds as well as Engineer, drilling for their protection. Patient is alert and oriented, warm and dry in no acute distress. Patient denies SI, HI, and AVH. Pt. Encouraged to let me know if needs arise.
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
## 2020-01-10 NOTE — ED Notes (Signed)
Pt dressed out in burgundy scrubs with myself and Hotel manager present.  Pt has red shoes, socks, jeans, underwear, tshirt, multicolored sweatshirt, hat and sunglasses.  Wallet with a dollar and a quarter, cell phone and cigs.  Bagged, labelled and placed at nurses station
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
## 2020-01-11 ENCOUNTER — Inpatient Hospital Stay
Admission: RE | Admit: 2020-01-11 | Discharge: 2020-03-04 | DRG: 882 | Disposition: A | Discharge status: Home / Self Care | Payer: Medicaid Other | Source: Intra-hospital | Attending: Behavioral Health | Admitting: Behavioral Health

## 2020-01-11 DIAGNOSIS — F70 Mild intellectual disabilities: Secondary | ICD-10-CM | POA: Diagnosis present

## 2020-01-11 DIAGNOSIS — F23 Brief psychotic disorder: Secondary | ICD-10-CM | POA: Diagnosis present

## 2020-01-11 DIAGNOSIS — F809 Developmental disorder of speech and language, unspecified: Secondary | ICD-10-CM | POA: Diagnosis present

## 2020-01-11 DIAGNOSIS — D529 Folate deficiency anemia, unspecified: Secondary | ICD-10-CM | POA: Diagnosis present

## 2020-01-11 DIAGNOSIS — Z79899 Other long term (current) drug therapy: Secondary | ICD-10-CM

## 2020-01-11 DIAGNOSIS — I10 Essential (primary) hypertension: Secondary | ICD-10-CM | POA: Diagnosis present

## 2020-01-11 DIAGNOSIS — F603 Borderline personality disorder: Secondary | ICD-10-CM | POA: Diagnosis present

## 2020-01-11 DIAGNOSIS — F840 Autistic disorder: Secondary | ICD-10-CM | POA: Diagnosis present

## 2020-01-11 DIAGNOSIS — Z20822 Contact with and (suspected) exposure to covid-19: Secondary | ICD-10-CM | POA: Diagnosis present

## 2020-01-11 DIAGNOSIS — F250 Schizoaffective disorder, bipolar type: Secondary | ICD-10-CM | POA: Diagnosis present

## 2020-01-11 DIAGNOSIS — F17210 Nicotine dependence, cigarettes, uncomplicated: Secondary | ICD-10-CM | POA: Diagnosis present

## 2020-01-11 DIAGNOSIS — R45850 Homicidal ideations: Secondary | ICD-10-CM | POA: Diagnosis present

## 2020-01-11 DIAGNOSIS — F4325 Adjustment disorder with mixed disturbance of emotions and conduct: Secondary | ICD-10-CM | POA: Diagnosis not present

## 2020-01-11 DIAGNOSIS — R440 Auditory hallucinations: Secondary | ICD-10-CM | POA: Diagnosis present

## 2020-01-11 LAB — TSH: TSH: 1.081 u[IU]/mL (ref 0.350–4.500)

## 2020-01-11 MED ORDER — LAMOTRIGINE 25 MG PO TABS
125.0000 mg | ORAL_TABLET | Freq: Every day | ORAL | Status: DC
  Administered 2020-01-11: 125 mg via ORAL
  Filled 2020-01-11: qty 1

## 2020-01-11 MED ORDER — LITHIUM CARBONATE 150 MG PO CAPS
150.0000 mg | ORAL_CAPSULE | Freq: Two times a day (BID) | ORAL | Status: DC
  Administered 2020-01-11: 150 mg via ORAL
  Filled 2020-01-11 (×2): qty 1

## 2020-01-11 MED ORDER — MELATONIN 3-10 MG PO TABS: 1.0000 | ORAL_TABLET | Freq: Every day | ORAL | Status: DC

## 2020-01-11 MED ORDER — LAMOTRIGINE 100 MG PO TABS: 100.0000 mg | ORAL_TABLET | Freq: Every day | ORAL | Status: DC

## 2020-01-11 MED ORDER — FOLIC ACID 1 MG PO TABS
1.0000 mg | ORAL_TABLET | Freq: Every day | ORAL | Status: DC
  Administered 2020-01-11: 1 mg via ORAL
  Filled 2020-01-11: qty 1

## 2020-01-11 MED ORDER — ARIPIPRAZOLE 5 MG PO TABS
5.0000 mg | ORAL_TABLET | Freq: Every day | ORAL | Status: DC
  Administered 2020-01-11: 5 mg via ORAL
  Filled 2020-01-11: qty 1

## 2020-01-11 MED ORDER — MELATONIN 3 MG PO TABS
3.0000 mg | ORAL_TABLET | Freq: Every day | ORAL | Status: DC
  Filled 2020-01-11: qty 1

## 2020-01-11 MED ORDER — MIRTAZAPINE 15 MG PO TABS
30.0000 mg | ORAL_TABLET | Freq: Every day | ORAL | Status: DC
  Administered 2020-01-11: 30 mg via ORAL
  Filled 2020-01-11: qty 2

## 2020-01-11 MED ORDER — MELATONIN 5 MG PO TABS
2.5000 mg | ORAL_TABLET | Freq: Every day | ORAL | Status: DC
  Administered 2020-01-11: 2.5 mg via ORAL
  Filled 2020-01-11: qty 0.5

## 2020-01-11 MED ORDER — MIRTAZAPINE 15 MG PO TABS: 15.0000 mg | ORAL_TABLET | Freq: Every day | ORAL | Status: DC

## 2020-01-11 MED ORDER — DOXEPIN HCL 10 MG PO CAPS
20.0000 mg | ORAL_CAPSULE | Freq: Every day | ORAL | Status: DC
  Administered 2020-01-11: 20 mg via ORAL
  Filled 2020-01-11: qty 2

## 2020-01-11 MED ORDER — BENZTROPINE MESYLATE 1 MG PO TABS: 2.0000 mg | ORAL_TABLET | Freq: Two times a day (BID) | ORAL | Status: DC

## 2020-01-11 MED ORDER — TRIHEXYPHENIDYL HCL 2 MG PO TABS
2.0000 mg | ORAL_TABLET | Freq: Two times a day (BID) | ORAL | Status: DC
  Administered 2020-01-11: 2 mg via ORAL
  Filled 2020-01-11 (×2): qty 1

## 2020-01-11 NOTE — ED Notes (Signed)
Hourly rounding reveals patient awake in hallway bed. No complaints, stable, in no acute distress. Q15 minute rounds and monitoring via Rover and Officer to continue. 

## 2020-01-11 NOTE — ED Notes (Signed)
Hourly rounding reveals patient in room. No complaints, stable, in no acute distress. Q15 minute rounds and monitoring via [place] and Officer to continue.   
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
## 2020-01-11 NOTE — ED Notes (Signed)
Report to include Situation, Background, Assessment, and Recommendations received from Amy RN. Patient alert and oriented, warm and dry, in no acute distress. Patient denies SI, HI, AVH and pain. Patient made aware of Q15 minute rounds and Engineer, drilling presence for their safety. Patient instructed to come to this nurse with needs or concerns.

## 2020-01-11 NOTE — ED Notes (Signed)
Attempted to call report, states they will give this nurse a call back

## 2020-01-11 NOTE — BH Assessment (Signed)
Patient is to be admitted to [facility] by Psychiatric Nurse Practitioner Rishaun [name].  Attending Physician will be Dr. Weber Cooks.   Patient has been assigned to room 305, by [place].    ER staff is aware of the admission:  [facility] ER Secretary    Dr. Tamala Julian, ER MD   Harrell Gave Patient's Nurse   Levada Dy Patient Access.  Attempted to contact patient's legal guardian Chase Caller [phone] as it is documented on patient's chart but no answer and unable to leave a voicemail.

## 2020-01-11 NOTE — ED Notes (Signed)
IVC pending placement 

## 2020-01-11 NOTE — ED Notes (Signed)
Patient given a snack

## 2020-01-12 ENCOUNTER — Encounter: Payer: Self-pay | Admitting: Internal Medicine

## 2020-01-12 ENCOUNTER — Other Ambulatory Visit: Payer: Self-pay

## 2020-01-12 DIAGNOSIS — F23 Brief psychotic disorder: Secondary | ICD-10-CM | POA: Diagnosis present

## 2020-01-12 MED ORDER — ALUM & MAG HYDROXIDE-SIMETH 200-200-20 MG/5ML PO SUSP: 30.0000 mL | ORAL | Status: DC | PRN

## 2020-01-12 MED ORDER — FOLIC ACID 1 MG PO TABS
1.0000 mg | ORAL_TABLET | Freq: Every day | ORAL | Status: DC
  Administered 2020-01-12 – 2020-03-04 (×51): 1 mg via ORAL
  Filled 2020-01-12 (×52): qty 1

## 2020-01-12 MED ORDER — LAMOTRIGINE 25 MG PO TABS
125.0000 mg | ORAL_TABLET | Freq: Every day | ORAL | Status: DC
  Administered 2020-01-12 – 2020-03-04 (×51): 125 mg via ORAL
  Filled 2020-01-12 (×52): qty 1

## 2020-01-12 MED ORDER — MELATONIN 5 MG PO TABS
2.5000 mg | ORAL_TABLET | Freq: Every day | ORAL | Status: DC
  Administered 2020-01-12 – 2020-03-03 (×51): 2.5 mg via ORAL
  Filled 2020-01-12 (×51): qty 1

## 2020-01-12 MED ORDER — ARIPIPRAZOLE 5 MG PO TABS
5.0000 mg | ORAL_TABLET | Freq: Every day | ORAL | Status: DC
  Administered 2020-01-12 – 2020-02-21 (×41): 5 mg via ORAL
  Filled 2020-01-12 (×41): qty 1

## 2020-01-12 MED ORDER — MIRTAZAPINE 15 MG PO TABS
30.0000 mg | ORAL_TABLET | Freq: Every day | ORAL | Status: DC
  Administered 2020-01-12 – 2020-03-03 (×51): 30 mg via ORAL
  Filled 2020-01-12 (×52): qty 2

## 2020-01-12 MED ORDER — DOXEPIN HCL 10 MG PO CAPS
20.0000 mg | ORAL_CAPSULE | Freq: Every day | ORAL | Status: DC
  Administered 2020-01-12 – 2020-03-03 (×51): 20 mg via ORAL
  Filled 2020-01-12 (×52): qty 2

## 2020-01-12 MED ORDER — LITHIUM CARBONATE 150 MG PO CAPS
150.0000 mg | ORAL_CAPSULE | Freq: Two times a day (BID) | ORAL | Status: DC
  Administered 2020-01-12 – 2020-03-04 (×102): 150 mg via ORAL
  Filled 2020-01-12 (×105): qty 1

## 2020-01-12 MED ORDER — TRIHEXYPHENIDYL HCL 2 MG PO TABS
2.0000 mg | ORAL_TABLET | Freq: Two times a day (BID) | ORAL | Status: DC
  Administered 2020-01-12 – 2020-03-04 (×102): 2 mg via ORAL
  Filled 2020-01-12 (×105): qty 1

## 2020-01-12 MED ORDER — ACETAMINOPHEN 325 MG PO TABS
650.0000 mg | ORAL_TABLET | Freq: Four times a day (QID) | ORAL | Status: DC | PRN
  Filled 2020-01-12: qty 2

## 2020-01-12 MED ORDER — MAGNESIUM HYDROXIDE 400 MG/5ML PO SUSP: 30.0000 mL | Freq: Every day | ORAL | Status: DC | PRN

## 2020-01-12 NOTE — Plan of Care (Signed)
Patient stayed in bed until around noon.  Got up for lunch and remained in the milieu for a while. Cooperative during assessment. Alert and oriented x 4 but slow with information processing. Denied suicidal/homicidal thoughts. Denied hallucinations. Patient ate his meals as served and did not took medications as prescribed. Had no concerns this shift.
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
## 2020-01-12 NOTE — Tx Team (Addendum)
Initial Treatment Plan 01/12/2020 6:25 AM [name] UFC:[id number]    PATIENT STRESSORS: Financial difficulties Medication change or noncompliance   PATIENT STRENGTHS: Motivation for treatment/growth Religious Affiliation Supportive family/friends   PATIENT IDENTIFIED PROBLEMS: Psychosis     Suicidal Ideation                  DISCHARGE CRITERIA:  Improved stabilization in mood, thinking, and/or behavior Motivation to continue treatment in a less acute level of care  PRELIMINARY DISCHARGE PLAN: Outpatient therapy  PATIENT/FAMILY INVOLVEMENT: This treatment plan has been presented to and reviewed with the patient, Ryan Zuniga, The patient and family have been given the opportunity to ask questions and make suggestions.  Harl Bowie, RN 01/12/2020, 6:25 AM

## 2020-01-12 NOTE — BHH Group Notes (Signed)
[facility] Group Notes: (Clinical Social Work)   01/12/2020      Type of Therapy:  Group Therapy   Participation Level:  Did Not Attend - was invited individually by Nurse/MHT and chose not to attend.   Raina Mina, LCSWA 01/12/2020  4:15 PM
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
## 2020-01-12 NOTE — Plan of Care (Signed)
Patient currently denies SI/HI/AVH no bizarre behavior making appropriate  eye contact.

## 2020-01-12 NOTE — BHH Suicide Risk Assessment (Signed)
[facility] Admission Suicide Risk Assessment   Nursing information obtained from:  Patient Demographic factors:  Male, Adolescent or young adult, Caucasian, Low socioeconomic status Current Mental Status:  NA Loss Factors:  NA, Decline in physical health Historical Factors:  Impulsivity Risk Reduction Factors:  Positive therapeutic relationship  Total Time spent with patient: 1 hour Principal Problem: <principal problem not specified> Diagnosis:  Active Problems:   Acute psychosis ([place])  Subjective Data:  Ryan Zuniga is a 39 year old male with a historical dx of schizoaffective disorder who has been seen by psychiatric providers at [facility] throughout the years.  He resides in a group home and his legal guardian is [place] at [phone].  Patient was involuntary committed while he was at an appointment at [facility] due to auditory hallucinations and homicidal thoughts towards people at his group home and indicated that he wanted to use a knife to the emergency room staff.  Today, patient reports the onset of auditory hallucinations 1 week ago.  Says that he hears it a few minutes at a time 2-3 times a day.  Before 1 week ago, he heard voices 1 month before that.  States that he usually hears voices when he is stressed out.  Denies any particular stressors besides being at the group home, says that residents there are mean.  Cannot provide any examples at this time.  Wants to pontificate that he does not want to go back to her group home anymore.  Endorses delusions but only that people at the group home will hurt him.  He later reveals that he actually does not have auditory hallucinations and just said that to be away from there.  Difficult to ascertain his consistency on this, at this point will obtain collateral information from his legal guardian, but unable to reach her at this time; message left requesting a call back.  Predominant mood over the past several months  has been "a little depressed."  No issues of sleep appetite or energy level.  Concentration is at his baseline.  He currently denies suicidal or homicidal ideations.  Denies excessive anxiety.  Does report anger problems and says that only people at group home trigger this.  Does not elaborate further with details.  He is simplistic and concrete in his interactions during the interview.   Continued Clinical Symptoms:  Alcohol Use Disorder Identification Test Final Score (AUDIT): 10 The "Alcohol Use Disorders Identification Test", Guidelines for Use in Primary Care, Second Edition.  World Pharmacologist [facility]). Score between 0-7:  no or low risk or alcohol related problems. Score between 8-15:  moderate risk of alcohol related problems. Score between 16-19:  high risk of alcohol related problems. Score 20 or above:  warrants further diagnostic evaluation for alcohol dependence and treatment.     Review of Systems  Constitutional:Negative.  HENT:Negative.  Eyes:Negative.  Respiratory:Negative.  Cardiovascular:Negative.  Gastrointestinal:Negative.  Musculoskeletal:Negative.  Skin:Negative.  Neurological:Negative.  Psychiatric/Behavioral: Positive ford,dysphoric mood,hallucinations,.    Blood pressure 134/89, pulse 78, temperature 99.1 F (37.3 C), temperature source Oral, resp. rate 18, height 5\' 5"  (1.651 m), weight 95.8 kg, SpO2 99 %.Body mass index is 35.15 kg/m.  General Appearance:Casual  Eye Contact:good  Speech:Slow  Volume:wnl  Mood:"little depressed"   Affect:Congruent  Thought Process:Coherent  Orientation:Full (Time, Place, and Person)  Thought Content:Logical and Hallucinations:Auditory  Suicidal Thoughts:Yes.with intent/plan  Homicidal Thoughts:Yes.without intent/plan  Memory:Immediate;Fair Recent;Fair Remote;Fair  Judgement:poor  Insight:poor  Psychomotor Activity:Decreased  Concentration:  Concentration: [place]  Language:Fair  Akathisia:No  Handed:Right  AIMS (if indicated):   Assets: Housing Physical Health  ADL's:Intact  Cognition:poor    CLINICAL FACTORS:   Schizophrenia:   Paranoid or undifferentiated type   COGNITIVE FEATURES THAT CONTRIBUTE TO RISK:  IDD  SUICIDE RISK:   Minimal: No identifiable suicidal ideation.  Patients presenting with no risk factors but with morbid ruminations; may be classified as minimal risk based on the severity of the depressive symptoms  PLAN OF CARE: Psychiatric stabilization  I certify that inpatient services furnished can reasonably be expected to improve the patient's condition.   Rulon Sera, MD 01/12/2020, 1:34 PM

## 2020-01-12 NOTE — Tx Team (Signed)
Interdisciplinary Treatment and Diagnostic Plan Update  01/12/2020 Time of Session: 2:45 PM  Ryan Zuniga MRN: [id number]  Principal Diagnosis: <principal problem not specified>  Secondary Diagnoses: Active Problems:   Acute psychosis ([place])   Current Medications:  Current Facility-Administered Medications  Medication Dose Route Frequency Provider Last Rate Last Admin  . acetaminophen (TYLENOL) tablet 650 mg  650 mg Oral Q6H PRN [name], [name], NP      . alum & mag hydroxide-simeth (MAALOX/MYLANTA) 200-200-20 MG/5ML suspension 30 mL  30 mL Oral Q4H PRN [name], [name], NP      . ARIPiprazole (ABILIFY) tablet 5 mg  5 mg Oral QHS [name], [name], NP      . doxepin (SINEQUAN) capsule 20 mg  20 mg Oral QHS [name], [name], NP      . folic acid (FOLVITE) tablet 1 mg  1 mg Oral Daily [name], [name], NP   1 mg at 01/12/20 0855  . lamoTRIgine (LAMICTAL) tablet 125 mg  125 mg Oral Daily Anette Riedel M, NP   125 mg at 01/12/20 0854  . lithium carbonate capsule 150 mg  150 mg Oral BID WC [name], [name], NP   150 mg at 01/12/20 0855  . magnesium hydroxide (MILK OF MAGNESIA) suspension 30 mL  30 mL Oral Daily PRN [name], [name], NP      . melatonin tablet 2.5 mg  2.5 mg Oral QHS [name], [name], NP      . mirtazapine (REMERON) tablet 30 mg  30 mg Oral QHS [name], [name], NP      . trihexyphenidyl (ARTANE) tablet 2 mg  2 mg Oral BID WC [name], [name], NP   2 mg at 01/12/20 6761   PTA Medications: Medications Prior to Admission  Medication Sig Dispense Refill Last Dose  . ARIPiprazole Lauroxil ER 1064 MG/3.9ML PRSY Inject into the muscle.     . benztropine (COGENTIN) 2 MG tablet Take 2 mg by mouth 2 (two) times daily.     . folic acid (FOLVITE) 1 MG tablet Take 1 mg by mouth daily.     Marland Kitchen LAMICTAL 100 MG tablet Take 100 mg by mouth daily.     . Melatonin 3-10 MG TABS Take 1 tablet by mouth at bedtime.     . mirtazapine (REMERON) 15 MG tablet Take 15 mg by mouth at bedtime.        Patient Stressors: Financial difficulties Medication change or noncompliance  Patient Strengths: Motivation for treatment/growth Religious Affiliation Supportive family/friends  Treatment Modalities: Medication Management, Group therapy, Case management,  1 to 1 session with clinician, Psychoeducation, Recreational therapy.   Physician Treatment Plan for Primary Diagnosis: <principal problem not specified> Long Term Goal(s):     Short Term Goals:    Medication Management: Evaluate patient's response, side effects, and tolerance of medication regimen.  Therapeutic Interventions: 1 to 1 sessions, Unit Group sessions and Medication administration.  Evaluation of Outcomes: Not Met  Physician Treatment Plan for Secondary Diagnosis: Active Problems:   Acute psychosis ([place])  Long Term Goal(s):     Short Term Goals:       Medication Management: Evaluate patient's response, side effects, and tolerance of medication regimen.  Therapeutic Interventions: 1 to 1 sessions, Unit Group sessions and Medication administration.  Evaluation of Outcomes: Not Met   RN Treatment Plan for Primary Diagnosis: <principal problem not specified> Long Term Goal(s): Knowledge of disease and therapeutic regimen to maintain health will improve  Short Term  Goals: Ability to demonstrate self-control, Ability to participate in decision making will improve, Ability to verbalize feelings will improve, Ability to identify and develop effective coping behaviors will improve and Compliance with prescribed medications will improve  Medication Management: RN will administer medications as ordered by provider, will assess and evaluate patient's response and provide education to patient for prescribed medication. RN will report any adverse and/or side effects to prescribing provider.  Therapeutic Interventions: 1 on 1 counseling sessions, Psychoeducation, Medication administration, Evaluate responses to  treatment, Monitor vital signs and CBGs as ordered, Perform/monitor CIWA, COWS, AIMS and Fall Risk screenings as ordered, Perform wound care treatments as ordered.  Evaluation of Outcomes: Not Met   LCSW Treatment Plan for Primary Diagnosis: <principal problem not specified> Long Term Goal(s): Safe transition to appropriate next level of care at discharge, Engage patient in therapeutic group addressing interpersonal concerns.  Short Term Goals: Engage patient in aftercare planning with referrals and resources, Increase social support, Increase ability to appropriately verbalize feelings, Increase emotional regulation and Increase skills for wellness and recovery  Therapeutic Interventions: Assess for all discharge needs, 1 to 1 time with Social worker, Explore available resources and support systems, Assess for adequacy in community support network, Educate family and significant other(s) on suicide prevention, Complete Psychosocial Assessment, Interpersonal group therapy.  Evaluation of Outcomes: Not Met   Progress in Treatment: Attending groups: No. Participating in groups: No. Taking medication as prescribed: Yes. Toleration medication: Yes. Family/Significant other contact made: Yes, individual(s) contacted:  Collateral will be contacted  Patient understands diagnosis: Yes. Discussing patient identified problems/goals with staff: Yes. Medical problems stabilized or resolved: Yes. Denies suicidal/homicidal ideation: Yes. Issues/concerns per patient self-inventory: No. Other: N/a   New problem(s) identified: No, Describe:  None   New Short Term/Long Term Goal(s): Elimination of symptoms of psychosis, medication management for mood stabilization; development of comprehensive mental wellness plan.   Patient Goals:  Patient stated that he would like to live somewhere else. Patient does not want to return to his group home.   Discharge Plan or Barriers: CSW will discuss appropriate  plan for discharge   Reason for Continuation of Hospitalization: Hallucinations Medication stabilization  Estimated Length of Stay: TBD   Attendees: Patient: Ryan Zuniga 01/12/2020 2:52 PM  Physician: Dr. Daneil Dolin, MD  01/12/2020 2:52 PM  Nursing:  01/12/2020 2:52 PM  RN Care Manager: 01/12/2020 2:52 PM  Social Worker: Raina Mina, [place] 01/12/2020 2:52 PM  Recreational Therapist:  01/12/2020 2:52 PM  Other:  01/12/2020 2:52 PM  Other:  01/12/2020 2:52 PM  Other: 01/12/2020 2:52 PM    Scribe for Treatment Team: Raina Mina, [place] 01/12/2020 2:52 PM

## 2020-01-12 NOTE — BHH Counselor (Signed)
Adult Comprehensive Assessment  Patient ID: Ryan Zuniga, male   DOB: 15-Apr-1981, 39 y.o.   MRN: [id number]  Information Source: Information source: Patient  Current Stressors:  Patient states their primary concerns and needs for treatment are:: Group home kicked him out Patient states their goals for this hospitilization and ongoing recovery are:: Patient stated no Educational / Learning stressors: N/a Employment / Job issues: N/a Family Relationships: Patient stated he has a good relationship Museum/gallery curator / Lack of resources (include bankruptcy): SSI Housing / Lack of housing: Live at group home Physical health (include injuries & life threatening diseases): No Social relationships: Good Substance abuse: N/a  Living/Environment/Situation:  Living Arrangements: Group Home Living conditions (as described by patient or guardian): Bad. Trouble makers Who else lives in the home?: [place] How long has patient lived in current situation?: Over a year What is atmosphere in current home: Other (Comment) (Not good. Trouble makers)  Family History:  Marital status: Single Are you sexually active?: No What is your sexual orientation?: Heterosexual Has your sexual activity been affected by drugs, alcohol, medication, or emotional stress?: N/a Does patient have children?: No  Childhood History:  By whom was/is the patient raised?: Mother Additional childhood history information: Patient stated he gets to talk with his mom and dad Description of patient's relationship with caregiver when they were a child: Good Patient's description of current relationship with people who raised him/her: so-so relationship with parents How were you disciplined when you got in trouble as a child/adolescent?: Patient does not remember Does patient have siblings?: Yes Number of Siblings: 2 (1 brother and 1 sister) Description of patient's current relationship with siblings: Patient has a relationship  with brother Did patient suffer any verbal/emotional/physical/sexual abuse as a child?: No Did patient suffer from severe childhood neglect?: No Patient description of severe childhood neglect: N/a Has patient ever been sexually abused/assaulted/raped as an adolescent or adult?: No Was the patient ever a victim of a crime or a disaster?: No Witnessed domestic violence?: No Has patient been affected by domestic violence as an adult?: No  Education:  Highest grade of school patient has completed: 12th grade Currently a student?: No Learning disability?: Yes What learning problems does patient have?: Patient could not remember what type of learning disability he had  Employment/Work Situation:   Employment situation: On disability Why is patient on disability: Patient is unable to care for himself How long has patient been on disability: Patient thinks since he was 39 years old Patient's job has been impacted by current illness: No 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 Where was the patient employed at that time?: couple months Has patient ever been in the [employer]?: No  Financial Resources:   Museum/gallery curator resources: Armed forces training and education officer Does patient have a Programmer, applications or guardian?: Yes Name of representative payee or guardian: Patient stated [name] but does not know her last name or phone number  Alcohol/Substance Abuse:   What has been your use of drugs/alcohol within the last 12 months?: N/a If attempted suicide, did drugs/alcohol play a role in this?: No Alcohol/Substance Abuse Treatment Hx: Past Tx, Inpatient If yes, describe treatment: Unable to answer Has alcohol/substance abuse ever caused legal problems?: No  Social Support System:   Patient's Community Support System: Good Describe Community Support System: Has support from both parents Type of faith/religion: N/A How does patient's faith help to cope with current illness?:  N/A  Leisure/Recreation:   Do You Have  Hobbies?: Yes Leisure and Hobbies: Watch movies  Strengths/Needs:   What is the patient's perception of their strengths?: Painting Patient states they can use these personal strengths during their treatment to contribute to their recovery: Yes Patient states these barriers may affect/interfere with their treatment: No Patient states these barriers may affect their return to the community: Patient stated he is not going back to the group home Other important information patient would like considered in planning for their treatment: No  Discharge Plan:   Currently receiving community mental health services: Yes (From Whom) Patient states concerns and preferences for aftercare planning are: Patient stated he has an ACTT team Patient states they will know when they are safe and ready for discharge when: Patient does not know at this time Does patient have access to transportation?: No Does patient have financial barriers related to discharge medications?: No Patient description of barriers related to discharge medications: N/a Plan for no access to transportation at discharge: Patient is not sure at this time Plan for living situation after discharge: Patient stated he is not going back to the group home Will patient be returning to same living situation after discharge?: No  Summary/Recommendations:   Summary and Recommendations (to be completed by the evaluator): Patient is a 39 year old male who presents to [facility] ED under IVC. Patient stated his current stressors come from his group home. Patient stated that the group home members are troublemakers. Patient stated he does not want to return to his group home. Patient was unable to identify any goals that he would like to accomplish while in the hospital. Patient denies any SI/AH/HI. Patient stated he currently has an ACTT team and a legal guardian. Patient will benefit from crisis stabilization,  medication evaluation, group therapy and psychoeducation, in addition to case management for discharge planning. At discharge it is recommended that Patient adhere to the established discharge plan and continue in treatment.  Raina Mina. 01/12/2020

## 2020-01-13 NOTE — Plan of Care (Signed)
  Problem: Education: Goal: Knowledge of [place] [facility] Education information/materials will improve 01/13/2020 0147 by Libby Maw, RN Outcome: Not Progressing 01/13/2020 0147 by Libby Maw, RN Outcome: Not Progressing Goal: Emotional status will improve 01/13/2020 0147 by Libby Maw, RN Outcome: Not Progressing 01/13/2020 0147 by Libby Maw, RN Outcome: Not Progressing Goal: Mental status will improve 01/13/2020 0147 by Libby Maw, RN Outcome: Not Progressing 01/13/2020 0147 by Libby Maw, RN Outcome: Not Progressing Goal: Verbalization of understanding the information provided will improve 01/13/2020 0147 by Libby Maw, RN Outcome: Not Progressing 01/13/2020 0147 by Libby Maw, RN Outcome: Not Progressing

## 2020-01-13 NOTE — BHH Group Notes (Signed)
[place] Group Notes: (Clinical Social Work)   01/13/2020      Type of Therapy:  Group Therapy   Participation Level:  Did Not Attend - was invited individually by Nurse/MHT and chose not to attend.   Raina Mina, [place] 01/13/2020  3:37 PM

## 2020-01-13 NOTE — Plan of Care (Signed)
Patient stayed in room awake, denying pain/concerns. Denied SI/HI/AVH. Reported that he will get up around lunch time. Currently in the day room eating lunch. No sign of distress.
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
## 2020-01-13 NOTE — Progress Notes (Signed)
Patient has been calm and cooperative on the unit. Isolative. Denies SI, HI and AVH

## 2020-01-13 NOTE — BHH Suicide Risk Assessment (Signed)
[facility] INPATIENT:  Family/Significant Other Suicide Prevention Education  Suicide Prevention Education:  Contact Attempts: [name], ([place], [phone]) has been identified by the patient as the family member/significant other with whom the patient will be residing, and identified as the person(s) who will aid the patient in the event of a mental health crisis.  With written consent from the patient, two attempts were made to provide suicide prevention education, prior to and/or following the patient's discharge.  We were unsuccessful in providing suicide prevention education.  A suicide education pamphlet was given to the patient to share with family/significant other.  Date and time of first attempt: 01/13/2020/11:43 AM  Date and time of second attempt  Raina Mina 01/13/2020, 11:48 AM

## 2020-01-14 DIAGNOSIS — F4325 Adjustment disorder with mixed disturbance of emotions and conduct: Secondary | ICD-10-CM | POA: Diagnosis not present

## 2020-01-14 NOTE — Progress Notes (Signed)
Recreation Therapy Notes  INPATIENT RECREATION THERAPY ASSESSMENT  Patient Details Name: Jaremy Nosal MRN: [id number] DOB: 1981-02-18 Today's Date: 01/14/2020       Information Obtained From: Patient  Able to Participate in Assessment/Interview: Yes  Patient Presentation: Responsive  Reason for Admission (Per Patient): Active Symptoms  Patient Stressors:    Coping Skills:   TV, Art  Leisure Interests (2+):  Individual - TV, Community - Movies, Medical laboratory scientific officer - Production assistant, radio, Best boy - Painting  Frequency of Recreation/Participation: Monthly  Awareness of Community Resources:     [employer]:     Current Use:    If no, Barriers?:    Expressed Interest in [place] of Residence:  Insurance underwriter  Patient Main Form of Transportation: Walk  Patient Strengths:  N/A  Patient Identified Areas of Improvement:  N/A  Patient Goal for Hospitalization:  To get out  Current SI (including self-harm):  No  Current HI:  No  Current AVH: No  Staff Intervention Plan: Group Attendance, Collaborate with Interdisciplinary Treatment Team  Consent to Intern Participation: N/A  Trianna Lupien 01/14/2020, 3:39 PM

## 2020-01-14 NOTE — Progress Notes (Signed)
Patient has been quiet, pleasant, calm and cooperative. Denies SI, HI and AVH
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
## 2020-01-14 NOTE — Progress Notes (Signed)
D: Pt alert and oriented. Pt rates depression 5/10, hopelessness 5/10, and anxiety 5/10. Pt reports energy level as normal and concentration as being good. Pt reports sleep last night as being fair. Pt did not receive medications for sleep. Pt denies experiencing any pain at this time. Pt denies experiencing any SI/HI, or AVH at this time.   A: Scheduled medications administered to pt, per MD orders. Support and encouragement provided. Frequent verbal contact made. Routine safety checks conducted q15 minutes.   R: No adverse drug reactions noted. Pt verbally contracts for safety at this time. Pt complaint with medications. Pt interacts well with others on the unit minimally. Pt remains safe at this time. Will continue to monitor.

## 2020-01-14 NOTE — Clinical Social Work Note (Signed)
Spoke with Scientist, physiological at guardian agency after leaving message for guardian Ms Ma Rings.  Administrator confirms that Ms Ma Rings is guardian, and that patient was IVC'd by [facility] ACT team.  Left a message for [facility].

## 2020-01-14 NOTE — Clinical Social Work Note (Signed)
Spoke with [facility] ACT team.  They are fine with him returning to [facility].  Spoke with guardian, who let me know that [facility] will likely not take him home as she has been looking for alternative placement for several months.  Called [facility], [facility], [phone], and spoke with Ryan Zuniga, who feels like they ar enot able to meet Ryan Zuniga's needs.  He runs away fairly regularly, and they do not have the staffing to go look for him.  He has been calling 911 on an increasing basis recently.  They have been asking for more help, but the ACT team is not increasing hours, and the guardian has not identified more resources.  Ryan Zuniga feels like he either needs 1:1 staffing or a higher level of care.  He states he gave Ryan Zuniga his 46 day notice a couple of months ago, and has only kept him while waiting for another facility but nothing has been found.  Ryan Zuniga is willing to take Ryan Zuniga back only if more resources are in place.  I asked him to send me a copy of the 30 day notice.

## 2020-01-14 NOTE — Plan of Care (Signed)
  Problem: B[facility] Concurrent Medical Problem Goal: LTG-Pt will be physically stable and he/significant other Description: (Patient will be physically stable and he/significant other will be able to verbalize understanding of follow-up care and symptoms that would warrant further treatment) Outcome: Progressing   Problem: Education: Goal: Knowledge of [facility] Education information/materials will improve Outcome: Progressing Goal: Emotional status will improve Outcome: Progressing Goal: Mental status will improve Outcome: Progressing Goal: Verbalization of understanding the information provided will improve Outcome: Progressing

## 2020-01-14 NOTE — BHH Suicide Risk Assessment (Signed)
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   HENT: Negative.   Eyes: Negative.   Respiratory: Negative.   Cardiovascular: Negative.   Gastrointestinal: Negative.   Musculoskeletal: Negative.   Skin: Negative.   Neurological: Negative.   Psychiatric/Behavioral: Negative.     Blood pressure 128/87, pulse 92, temperature 97.9 F (36.6 C), temperature source Oral, resp. rate 17, height 5\' 9"  (1.753 m), weight 72.6 kg, SpO2 100 %.Body mass index is 23.63 kg/m.  General Appearance: Casual  Eye Contact::  Good  Speech:  Clear and Coherent and Slow409  Volume:  Decreased  Mood:  Euthymic  Affect:  Congruent  Thought Process:  Goal Directed  Orientation:  Full (Time, Place, and Person)  Thought Content:  Logical  Suicidal Thoughts:  No  Homicidal Thoughts:  No  Memory:  Immediate;   Fair Recent;   Fair Remote;   Fair  Judgement:  Fair  Insight:  Fair  Psychomotor Activity:  Normal  Concentration:  Fair  Recall:  [employer] [place]  Language: Fair  Akathisia:  No  Handed:  Right  AIMS (if indicated):     Assets:  Desire for Improvement Housing Physical Health Resilience Social Support  Sleep:  Number of Hours: 8.25  Cognition: Impaired,  Mild  ADL's:  Impaired   Mental Status Per Nursing Assessment::   On Admission:  NA  Demographic Factors:  Male  Loss Factors: NA  Historical Factors: Impulsivity  Risk Reduction  Factors:   Living with another person, especially a relative, Positive social support and Positive therapeutic relationship  Continued Clinical Symptoms:  Schizophrenia:   Paranoid or undifferentiated type  Cognitive Features That Contribute To Risk:  None    Suicide Risk:  Minimal: No identifiable suicidal ideation.  Patients presenting with no risk factors but with morbid ruminations; may be classified as minimal risk based on the severity of the depressive symptoms    Plan Of Care/Follow-up recommendations:  Activity:  Activity as tolerated Diet:  Regular diet Other:  Follow-up with outpatient provider at [facility].  Alethia Berthold, MD 01/14/2020, 10:08 AM

## 2020-01-14 NOTE — Progress Notes (Signed)
Recreation Therapy Notes   Date: 01/14/2020  Time: 9:30 am   Location: [facility] [place]     Behavioral response: N/A   Intervention Topic: Social Skills    Discussion/Intervention: Patient did not attend group.   Clinical Observations/Feedback:  Patient did not attend group.   Nilaya Bouie LRT/CTRS         Enjoli Tidd 01/14/2020 12:56 PM

## 2020-01-15 DIAGNOSIS — F4325 Adjustment disorder with mixed disturbance of emotions and conduct: Secondary | ICD-10-CM | POA: Diagnosis not present

## 2020-01-15 MED ORDER — NICOTINE 21 MG/24HR TD PT24
21.0000 mg | MEDICATED_PATCH | Freq: Every day | TRANSDERMAL | Status: DC
  Administered 2020-01-15 – 2020-03-02 (×42): 21 mg via TRANSDERMAL
  Filled 2020-01-15 (×44): qty 1

## 2020-01-15 NOTE — Progress Notes (Signed)
D- Patient alert and oriented. Affect/mood is calm. cooperative and flat. Pt denies SI, HI, AVH, pain. Pt did not attend groups and stays mostly withdrawn in his room.   A- Scheduled medications administered to patient, per MD orders. Support and encouragement provided.  Routine safety checks conducted every 15 minutes.  Patient informed to notify staff with problems or concerns.  R- No adverse drug reactions noted. Patient contracts for safety at this time. Patient compliant with medications and treatment plan. Patient receptive, calm, and cooperative. Patient interacts well with others on the unit.  Patient remains safe at this time.  Collier Bullock RN

## 2020-01-15 NOTE — Progress Notes (Signed)
Patient calm and appropriate on the unit and was observed by this Probation officer interacting appropriately with peers and staff. Patient compliant with medication administration per MD orders this evening. Patient denies SI/HI/AVH. Pt presents with intellectual disability. Patient given support, encouragement and education. Patient being monitored Q 15 minutes for safety per unit protocol. Pt remains safe on the unit.

## 2020-01-15 NOTE — BHH Counselor (Signed)
CSW spoke with the patient's guardian, [name], [place], [phone].  CSW requested that FL2 and guardianship be faxed to this CSW.  CSW also requested that consents are signed allowing for CSW to reach out to the patient's providers.    She reports that the patient is unable to return to the group home "due to excessive 911 calls, walking away from the facility and he can be aggressive".  She reports that the patient was officially given the 30 day notice "July 20th, but they kept taking it back saying 'he can stay'".  She initially reports that she and the ACTT team just started looking for placement, however, when CSW asked for clarifying information she reports that she has been unable to procure placement and has been looking since July.    [place], [phone], fax: [phone], [place], MSW, [place] 01/15/2020 1:40 PM

## 2020-01-15 NOTE — Progress Notes (Signed)
Patient pleasant and cooperative. Isolative to self and room. Forwards minimal. Night meds taken to room. Denies SI, HI, AVH. Appropriate with staff and peers. Encouragement and support provided. Safety checks maintained. Medications given as prescribed. Pt receptive and remains safe on unit with q 15 min checks.

## 2020-01-15 NOTE — Plan of Care (Signed)
Patient calm and pleasant during assessment. Pt denies SI/HI/AVH  Problem: Education: Goal: Emotional status will improve Outcome: Progressing Goal: Mental status will improve Outcome: Progressing

## 2020-01-15 NOTE — Progress Notes (Signed)
Recreation Therapy Notes    Date: 01/15/2020  Time: 9:30 am   Location: [facility]     Behavioral response: N/A   Intervention Topic: Strengths  Discussion/Intervention: Patient did not attend group.   Clinical Observations/Feedback:  Patient did not attend group.   Loreal Schuessler LRT/CTRS         Mattingly Fountaine 01/15/2020 10:38 AM

## 2020-01-15 NOTE — BHH Counselor (Signed)
CSW faxed consent and release form to the patient's guardian.  CSW received confirmation the fax was successful.  Assunta Curtis, MSW, LCSW 01/15/2020 1:53 PM
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
## 2020-01-16 DIAGNOSIS — F4325 Adjustment disorder with mixed disturbance of emotions and conduct: Secondary | ICD-10-CM | POA: Diagnosis not present
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
## 2020-01-16 NOTE — Plan of Care (Signed)
Pt rates depression, anxiety and hopelessness all at 3/10. Pt denies AVH, SI and HI. Pt was educated on care plan and verbalizes understanding. Pt was encouraged to attend groups. Collier Bullock RN Problem: Education: Goal: Knowledge of [place] [facility] Education information/materials will improve Outcome: Progressing Goal: Emotional status will improve Outcome: Progressing Goal: Mental status will improve Outcome: Progressing Goal: Verbalization of understanding the information provided will improve Outcome: Progressing   Problem: Safety: Goal: Periods of time without injury will increase Outcome: Progressing   Problem: Education: Goal: Ability to state activities that reduce stress will improve Outcome: Progressing

## 2020-01-16 NOTE — BHH Counselor (Signed)
CSW spoke with patient's guardian to ascertain if fax was received and if guardianship paperwork was received.  Per guardian it was not received nor any paperwork sent.   Guardian reports that "I intended to call Venetia Constable today to see about Care Coordination".  CSW clarifiied if this was able to be completed and guardian reports that it was NOT "because I have to leave everyday by 3PM and could not get to it.  I will do it first thing in the morning."    CSW asked about verbal permission to speak with the patient's father who has called several times.  Guardian provided verbal permission for CSW to speak with parents, however requested that patient NOT speak with them due to them being a trigger for pt.  CSW requested that in addition guardian send guardianship paperwork, FL2, releases, consent and list of homes that have been contacted.  From CSW's understanding of the conversation the guardian has access to a computer system that allows her to select that FL2 be sent to group homes in [place], not that she has called them individually herself.  Guardian reports that "I really don't see how a group home will take him with his behaviors he needs a higher level of care.  That's why he needs care coordination."  CSW clarified that guardian, however, is looking into group homes despite this statement, due to guardian's previous statement.  Assunta Curtis, MSW, LCSW 01/16/2020 3:48 PM
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
## 2020-01-16 NOTE — Progress Notes (Signed)
D- Patient alert and oriented. Affect/mood is flat, cooperative, pleasant and calm. Pt denies SI, HI, AVH, and pain.   A- Scheduled medications administered to patient, per MD orders. Support and encouragement provided.  Routine safety checks conducted every 15 minutes.  Patient informed to notify staff with problems or concerns.  R- No adverse drug reactions noted. Patient contracts for safety at this time. Patient compliant with medications and treatment plan. Patient receptive, calm, and cooperative. Patient interacts well with others on the unit.  Patient remains safe at this time.  Pt had phone calls and spoke with his parents. He seemed content.   Collier Bullock RN

## 2020-01-16 NOTE — Plan of Care (Signed)
Patient endorses anxiety and depression   Problem: Education: Goal: Emotional status will improve Outcome: Not Progressing Goal: Mental status will improve Outcome: Not Progressing   

## 2020-01-16 NOTE — [facility] Counselor (Signed)
CSW resent the information and received confirmation again that fax was successful.  Assunta Curtis, MSW, LCSW 01/16/2020 4:04 PM

## 2020-01-16 NOTE — Progress Notes (Signed)
Patient calm and appropriate on the unit and was observed by this Probation officer interacting appropriately with peers and staff. Patient compliant with medication administration per MD orders this evening. Patient denies SI/HI/AVH. Pt presents with intellectual disability. Patient given support, encouragement and education. Patient being monitored Q 15 minutes for safety per unit protocol. Pt remains safe on the unit. Patient instructed to take a shower, was given supplies and clothing.

## 2020-01-17 DIAGNOSIS — F4325 Adjustment disorder with mixed disturbance of emotions and conduct: Secondary | ICD-10-CM | POA: Diagnosis not present

## 2020-01-17 NOTE — Plan of Care (Signed)
  Problem: Education: Goal: Knowledge of [facility] Education information/materials will improve Outcome: Progressing Goal: Emotional status will improve Outcome: Progressing Goal: Mental status will improve Outcome: Progressing Goal: Verbalization of understanding the information provided will improve Outcome: Progressing   Problem: Safety: Goal: Periods of time without injury will increase Outcome: Progressing   Problem: Education: Goal: Ability to state activities that reduce stress will improve Outcome: Progressing

## 2020-01-17 NOTE — Plan of Care (Signed)
Patient calm and pleasant, denying SI/HI/AVH  Problem: Education: Goal: Emotional status will improve Outcome: Progressing Goal: Mental status will improve Outcome: Progressing

## 2020-01-17 NOTE — Progress Notes (Signed)
Recreation Therapy Notes  Date: 01/17/2020  Time: 9:30 am   Location: Room 21   Behavioral response: N/A   Intervention Topic: Animal Assisted therapy    Discussion/Intervention: Patient did not attend group.   Clinical Observations/Feedback:  Patient did not attend group.   Dyonna Jaspers LRT/CTRS        Praveen Coia 01/17/2020 12:21 PM

## 2020-01-17 NOTE — [facility] Group Notes (Signed)
Adult Psychoeducational Group Note  Date:  01/17/2020 Time:  6:49 AM  Group Topic/Focus:  Wrap-Up Group:   The focus of this group is to help patients review their daily goal of treatment and discuss progress on daily workbooks.  Participation Level:  Active  Participation Quality:  Appropriate  Affect:  Appropriate  Cognitive:  Appropriate  Insight: Appropriate  Engagement in Group:  Engaged  Modes of Intervention:  Discussion  Additional Comments:  discussion on preventative and self hygiene  Romulus Hanrahan Marcine Matar 01/17/2020, 6:49 AM

## 2020-01-17 NOTE — Progress Notes (Signed)
D- Patient alert and oriented. Affect/mood is flat, calm, cooperative and pleasant. Pt rates depression and anxiety both at 3/10. Pt denies SI, HI, AVH, and pain. Pt  for the most part is withdrawn in his room but sometimes watches tv.   A- Scheduled medications administered to patient, per MD orders. Support and encouragement provided.  Routine safety checks conducted every 15 minutes.  Patient informed to notify staff with problems or concerns.  R- No adverse drug reactions noted. Patient contracts for safety at this time. Patient compliant with medications and treatment plan. Patient receptive, calm, and cooperative. Patient interacts well with others on the unit.  Patient remains safe at this time.  Collier Bullock RN
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
## 2020-01-17 NOTE — Tx Team (Signed)
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 acetaminophen (TYLENOL) tablet 650 mg  650 mg Oral Q6H PRN [name], [name], NP      . alum & mag hydroxide-simeth (MAALOX/MYLANTA) 200-200-20 MG/5ML suspension 30 mL  30 mL Oral Q4H PRN [name], [name], NP      . ARIPiprazole (ABILIFY) tablet 5 mg  5 mg Oral QHS [name], [name], NP   5 mg at 01/16/20 2134  . doxepin (SINEQUAN) capsule 20 mg  20 mg Oral QHS [name], [name], NP   20 mg at 01/16/20 2134  . folic acid (FOLVITE) tablet 1 mg  1 mg Oral Daily [name], [name], NP   1 mg at 01/17/20 0815  . lamoTRIgine (LAMICTAL) tablet 125 mg  125 mg Oral Daily [name], NP   125 mg at 01/17/20 0814  . lithium carbonate capsule 150 mg  150 mg Oral BID WC [name], [name], NP   150 mg at 01/17/20 0815  . magnesium hydroxide (MILK OF MAGNESIA) suspension 30 mL  30 mL Oral Daily PRN [name], [name], NP      . melatonin tablet 2.5 mg  2.5 mg Oral QHS [name], [name], NP   2.5 mg at 01/16/20 2134  . mirtazapine (REMERON) tablet 30 mg  30 mg Oral QHS [name], [name], NP   30 mg at 01/16/20 2134  . nicotine (NICODERM CQ - dosed in mg/24 hours) patch 21 mg  21 mg Transdermal Daily [name], [name], MD   21 mg at 01/17/20 0817  . trihexyphenidyl (ARTANE) tablet 2 mg  2 mg Oral BID WC [name], [name], NP   2 mg at 01/17/20 0815   PTA Medications: Medications Prior to Admission  Medication Sig Dispense Refill Last Dose  .  ARIPiprazole Lauroxil ER 1064 MG/3.9ML PRSY Inject into the muscle.     . benztropine (COGENTIN) 2 MG tablet Take 2 mg by mouth 2 (two) times daily.     . folic acid (FOLVITE) 1 MG tablet Take 1 mg by mouth daily.     Marland Kitchen LAMICTAL 100 MG tablet Take 100 mg by mouth daily.     . Melatonin 3-10 MG TABS Take 1 tablet by mouth at bedtime.     . mirtazapine (REMERON) 15 MG tablet Take 15 mg by mouth at bedtime.       Patient Stressors: Financial difficulties Medication change or noncompliance  Patient Strengths: Motivation for treatment/growth Religious Affiliation Supportive family/friends  Treatment Modalities: Medication Management, Group therapy, Case management,  1 to 1 session with clinician, Psychoeducation, Recreational therapy.   Physician Treatment Plan for Primary Diagnosis: Adjustment disorder with mixed disturbance of emotions and conduct Long Term Goal(s):     Short Term Goals:    Medication Management: Evaluate patient's response, side effects, and tolerance of medication regimen.  Therapeutic Interventions: 1 to 1 sessions, Unit Group sessions and Medication administration.  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  Therapeutic Interventions: 1 to 1 sessions, Unit Group sessions and Medication administration.  Evaluation of Outcomes: Progressing   RN Treatment Plan for Primary Diagnosis: Adjustment disorder with mixed disturbance of emotions and conduct Long Term Goal(s): Knowledge of disease and therapeutic regimen to maintain health will improve  Short Term Goals:  Ability to demonstrate self-control, Ability to participate in decision making will improve, Ability to verbalize feelings will improve, Ability to identify and develop effective coping behaviors will improve and Compliance with prescribed medications will improve  Medication Management: RN will administer medications as ordered by provider, will assess and evaluate patient's response and provide education to patient for prescribed medication. RN will report any adverse and/or side effects to prescribing provider.  Therapeutic Interventions: 1 on 1 counseling sessions, Psychoeducation, Medication administration, Evaluate responses to treatment, Monitor vital signs and CBGs as ordered, Perform/monitor CIWA, COWS, AIMS and Fall Risk screenings as ordered, Perform wound care treatments as ordered.  Evaluation of Outcomes: Progressing   LCSW Treatment Plan for Primary Diagnosis: Adjustment disorder with mixed disturbance of emotions and conduct Long Term Goal(s): Safe transition to appropriate next level of care at discharge, Engage patient in therapeutic group addressing interpersonal concerns.  Short Term Goals: Engage patient in aftercare planning with referrals and resources, Increase social support, Increase ability to appropriately verbalize feelings, Increase emotional regulation and Increase skills for wellness and recovery  Therapeutic Interventions: Assess for all discharge needs, 1 to 1 time with Social worker, Explore available resources and support systems, Assess for adequacy in community support network, Educate family and significant other(s) on suicide prevention, Complete Psychosocial Assessment, Interpersonal group therapy.  Evaluation of Outcomes: Progressing   Progress in Treatment: Attending groups: No. Participating in groups: No. Taking medication as prescribed: Yes. Toleration medication: Yes. Family/Significant other contact made: Yes, individual(s) contacted:  CSW  completed SPE with pt's guardian.  Patient understands diagnosis: Yes. Discussing patient identified problems/goals with staff: Yes. Medical problems stabilized or resolved: Yes. Denies suicidal/homicidal ideation: Yes. Issues/concerns per patient self-inventory: No. Other: N/a   New problem(s) identified: No, Describe:  None   New Short Term/Long Term Goal(s): Elimination of symptoms of psychosis, medication management for mood stabilization; development of comprehensive mental wellness plan.  Update 01/17/2020:  No changes at this time.    Patient Goals:  Patient stated that he would like to live somewhere else. Patient does not want to return to his group home. Update 01/17/2020:  No changes at this time.    Discharge Plan or Barriers: CSW will discuss appropriate plan for discharge.  Update 01/17/2020:  Patient is not able to return to his group home.  CSW will assist his guardian as she looks for placement for the patient.   Reason for Continuation of Hospitalization: Hallucinations Medication stabilization  Estimated Length of Stay: TBD   Attendees: Patient:  01/17/2020 12:45 PM  Physician: Dr. Weber Cooks, MD 01/17/2020 12:45 PM  Nursing:  01/17/2020 12:45 PM  RN Care Manager: 01/17/2020 12:45 PM  Social Worker: Assunta Curtis, [place] 01/17/2020 12:45 PM  Recreational Therapist:  01/17/2020 12:45 PM  Other:  01/17/2020 12:45 PM  Other:  01/17/2020 12:45 PM  Other: 01/17/2020 12:45 PM    Scribe for Treatment Team: Rozann Lesches,  LCSW 01/17/2020 12:45 PM

## 2020-01-18 DIAGNOSIS — F4325 Adjustment disorder with mixed disturbance of emotions and conduct: Secondary | ICD-10-CM | POA: Diagnosis not present
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
## 2020-01-18 NOTE — BHH Counselor (Signed)
CSW spoke with patient's legal guardian Loleta.  Per [name] she did not start looking for placement for the patient until 01/11/2020.  She reports that she has contacted these 5 places, [facility], [place] and [place], a provider in [place] that has other homes, and previous group home owner-if pt could go to one of his other homes.    She asked if there was a Care Coordinator on staff that could assist with placement.  She reports that previous group home owner wants pt to have a one to one if he returns and she is unsure of how to do that.   Assunta Curtis, MSW, LCSW 01/18/2020 12:13 PM

## 2020-01-18 NOTE — Plan of Care (Signed)
D- Patient alert and oriented. Patient presents in a pleasant mood on assessment stating that he slept "good" last night and had no complaints to voice to this . Patient denies SI, HI, AVH, and pain at this time. Patient also denies any signs/symptoms of depression and anxiety, reporting that overall, he is feeling "good". Patient had no stated goals for today.  A- Scheduled medications administered to patient, per MD orders. Support and encouragement provided.  Routine safety checks conducted every 15 minutes.  Patient informed to notify staff with problems or concerns.  R- No adverse drug reactions noted. Patient contracts for safety at this time. Patient compliant with medications and treatment plan. Patient receptive, calm, and cooperative. Patient interacts well with others on the unit.  Patient remains safe at this time.  Problem: Education: Goal: Knowledge of [place] [facility] Education information/materials will improve Outcome: Progressing Goal: Emotional status will improve Outcome: Progressing Goal: Mental status will improve Outcome: Progressing Goal: Verbalization of understanding the information provided will improve Outcome: Progressing   Problem: Safety: Goal: Periods of time without injury will increase Outcome: Progressing   Problem: Education: Goal: Ability to state activities that reduce stress will improve Outcome: Progressing

## 2020-01-18 NOTE — Progress Notes (Signed)
Patient calm and appropriate on the unit and was observed by this Probation officer interacting appropriately with peers and staff. Patient compliant with medication administration per MD orders this evening. Patient denies SI/HI/AVH. Pt presents with intellectual disability. Patient given support, encouragement and education. Patient being monitored Q 15 minutes for safety per unit protocol. Pt remains safe on the unit. Patient instructed to take a shower, was given supplies and clothing.

## 2020-01-19 NOTE — Plan of Care (Signed)
  Problem: Education: Goal: Knowledge of [place] [facility] Education information/materials will improve Outcome: Progressing Goal: Emotional status will improve Outcome: Progressing Goal: Mental status will improve Outcome: Progressing Goal: Verbalization of understanding the information provided will improve Outcome: Progressing   Problem: Safety: Goal: Periods of time without injury will increase Outcome: Progressing

## 2020-01-19 NOTE — Progress Notes (Signed)
Patient has been calm and cooperative. Isolative to room. Denies SI, HI and AVH

## 2020-01-19 NOTE — Progress Notes (Signed)
Patient has been quiet, calm and cooperative. Isolating to his room. Denies SI, HI and AVH. Medication compliant

## 2020-01-19 NOTE — Progress Notes (Signed)
D Alert and Oriented Presents with organized thought process. Denies SI./HI/ A/V/H.  A Scheduled medications administered per Provider order. Support and encouragement provided. Routine safety checks conducted every 15 minutes. Patient notified to inform staff with problems or concerns.  R. No adverse drug reactions noted. Patient contracts for safety at this time. Will continue to monitor .

## 2020-01-19 NOTE — Plan of Care (Signed)
  Problem: Education: Goal: Knowledge of [place] [facility] Education information/materials will improve Outcome: Progressing Goal: Emotional status will improve Outcome: Progressing Goal: Mental status will improve Outcome: Progressing Goal: Verbalization of understanding the information provided will improve Outcome: Progressing   
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
## 2020-01-19 NOTE — Progress Notes (Signed)
Patient refused morning vitals  

## 2020-01-19 NOTE — BHH Group Notes (Signed)
[facility] LCSW Group Therapy  01/19/2020 2:19 PM   Group Topic: The focus of this group is to identify areas and triggers of stress and discover ways to eleviate and manage stress.   Type of Therapy:  Psychoeducational Skills  Participation Level:  Did Not Attend  Participation Quality:  Did not attend  Affect:  N/A  Cognitive:  N/A  Insight:  None  Engagement in Therapy:  None  Modes of Intervention:    Summary of Progress/Problems: This patient was invited to attend by CSW, however this patient declined attendance.    Kharee Lesesne A Bettylou Frew 01/19/2020, 2:19 PM

## 2020-01-20 NOTE — BHH Group Notes (Signed)
[facility] Group Notes: (Clinical Social Work)   01/20/2020      Type of Therapy:  Group Therapy   Participation Level:  Did Not Attend - was invited individually by Nurse/MHT and chose not to attend.   Raina Mina, [place] 01/20/2020  4:57 PM

## 2020-01-20 NOTE — Progress Notes (Signed)
Patient ID: Ryan Zuniga, male   DOB: 1980-11-20, 39 y.o.   MRN: [id number]   Rama Anne Fu MD Brief Sunday Note Extended written yesterday 15 min    He is strange and odd at baseline Distracted by phone He says he has no new medical problems or side effects he feels more now in balance seeks possible discharge soon   Oriented times three Consciousness not clouded or fluctuant Concentration and attention fair Strange Eye gaze might be genetic  Thought content and process --no new psychosis or manic themes  Speech at baseline Memory the same Abstraction, fund of knowledge intelligence, judgement insight reliability limited SI and HI contracts for safety  No shakes tics tremors   Leans none Handed  Not known Akathisia none Recall fair Assets --caring team ADL's  Okay  Cognition limited  Psychomotor no change Language with articulation defects  Gait and station no change Musculoskeletal no comment   Aims not done   Patient with bipolar mixed --previously suicidal now contracts for safety  He feels new meds and adjustments make him feel better  Am lithium ordered   A/P  Continues on milieu and general routine pending discharge planning

## 2020-01-20 NOTE — Plan of Care (Signed)
  Problem: Education: Goal: Knowledge of [place] [facility] Education information/materials will improve Outcome: Progressing Goal: Emotional status will improve Outcome: Progressing Goal: Mental status will improve Outcome: Progressing Goal: Verbalization of understanding the information provided will improve Outcome: Progressing   Problem: Safety: Goal: Periods of time without injury will increase Outcome: Progressing

## 2020-01-20 NOTE — Progress Notes (Signed)
   01/20/20 1100  Psych Admission Type (Psych Patients Only)  Admission Status Involuntary  Psychosocial Assessment  Patient Complaints Anxiety;Irritability  Eye Contact Fair  Facial Expression Flat  Affect Flat;Anxious  Speech Logical/coherent  Interaction Childlike  Motor Activity Slow  Appearance/Hygiene Poor hygiene  Behavior Characteristics Unwilling to participate;Anxious  Mood Irritable  Aggressive Behavior  Effect No apparent injury  Thought Process  Coherency WDL  Content WDL  Delusions None reported or observed  Perception WDL  Hallucination None reported or observed  Judgment WDL  Confusion None  Danger to Self  Current suicidal ideation? Denies  Danger to Others  Danger to Others None reported or observed  Danger to Others Abnormal  Harmful Behavior to others No threats or harm toward other people  Destructive Behavior No threats or harm toward property  Pt in his room most of the day denies SI/HI, verbally contracted for safety. Pt denied to eat his breakfast and lunch stating that he is not hungry. Support and encouragement provided Fluids offered and tolerated will continue to monitor.

## 2020-01-20 NOTE — Progress Notes (Signed)
Patient took his 1700 medications late and ate dinner after multiple encouragement would not  Elaborate on his stressor this shift. Support and encouragement provided. Denies SI/HI and verbally contracted for safety.

## 2020-01-20 NOTE — Progress Notes (Signed)
Patient has been calm, cooperative and isolative to room. Denies SI, HI and AVH

## 2020-01-21 DIAGNOSIS — F4325 Adjustment disorder with mixed disturbance of emotions and conduct: Secondary | ICD-10-CM | POA: Diagnosis not present

## 2020-01-21 LAB — LITHIUM LEVEL: Lithium Lvl: 0.27 mmol/L — ABNORMAL LOW (ref 0.60–1.20)

## 2020-01-21 NOTE — Plan of Care (Signed)
D- Patient alert and oriented. Patient presents in a pleasant mood on assessment stating that he slept good last night and had no complaints to voice to this Probation officer. Patient denies SI, HI, AVH, and pain at this time. Patient also denies any signs/symptoms of depression and anxiety, reporting that he is feeling "good" overall. Patient continues to isolate to his room majority of the day except for meal times and medications. Patient will also pace the unit at times and then will return to his room, without any issues. Patient had no stated goals for today.  A- Scheduled medications administered to patient, per MD orders. Support and encouragement provided.  Routine safety checks conducted every 15 minutes.  Patient informed to notify staff with problems or concerns.  R- No adverse drug reactions noted. Patient contracts for safety at this time. Patient compliant with medications. Patient receptive, calm, and cooperative. Patient remains safe at this time.  Problem: Education: Goal: Knowledge of [facility] Education information/materials will improve Outcome: Progressing Goal: Emotional status will improve Outcome: Progressing Goal: Mental status will improve Outcome: Progressing Goal: Verbalization of understanding the information provided will improve Outcome: Progressing   Problem: Safety: Goal: Periods of time without injury will increase Outcome: Progressing   Problem: Education: Goal: Ability to state activities that reduce stress will improve Outcome: Progressing

## 2020-01-21 NOTE — Progress Notes (Addendum)
Recreation Therapy Notes  Date: 01/21/2020  Time: 9:30 am   Location: Courtyard    Behavioral response: N/A   Intervention Topic: Social skills   Discussion/Intervention: Patient did not attend group.   Clinical Observations/Feedback:  Patient did not attend group.   Eryck Negron LRT/CTRS          Tanessa Tidd 01/21/2020 12:09 PM

## 2020-01-21 NOTE — [facility] Group Notes (Signed)
[place] Group Notes:  (Nursing/MHT/Case Management/Adjunct)  Date:  01/21/2020  Time:  9:36 PM  Type of Therapy:  Group Therapy  Participation Level:  Did Not Attend   Ryan Zuniga 01/21/2020, 9:36 PM

## 2020-01-21 NOTE — [facility] Group Notes (Signed)
LCSW Group Therapy Note   01/21/2020 3:41 PM  Type of Therapy and Topic:  Group Therapy:  Overcoming Obstacles   Participation Level:  Did Not Attend   Description of Group:    In this group patients will be encouraged to explore what they see as obstacles to their own wellness and recovery. They will be guided to discuss their thoughts, feelings, and behaviors related to these obstacles. The group will process together ways to cope with barriers, with attention given to specific choices patients can make. Each patient will be challenged to identify changes they are motivated to make in order to overcome their obstacles. This group will be process-oriented, with patients participating in exploration of their own experiences as well as giving and receiving support and challenge from other group members.   Therapeutic Goals: 1. Patient will identify personal and current obstacles as they relate to admission. 2. Patient will identify barriers that currently interfere with their wellness or overcoming obstacles.  3. Patient will identify feelings, thought process and behaviors related to these barriers. 4. Patient will identify two changes they are willing to make to overcome these obstacles:      Summary of Patient Progress X   Therapeutic Modalities:   Cognitive Behavioral Therapy Solution Focused Therapy Motivational Interviewing Relapse Prevention Therapy  Assunta Curtis, MSW, LCSW 01/21/2020 3:41 PM
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
## 2020-01-22 DIAGNOSIS — F4325 Adjustment disorder with mixed disturbance of emotions and conduct: Secondary | ICD-10-CM | POA: Diagnosis not present

## 2020-01-22 NOTE — Plan of Care (Signed)
Patient presents at his baseline  Problem: Education: Goal: Emotional status will improve Outcome: Not Progressing Goal: Mental status will improve Outcome: Not Progressing   

## 2020-01-22 NOTE — Care Management (Signed)
Mental Health Facilities contacted in search of placement:  [facility], [phone]- will have a bed in about 1 week-FL2 fax to [place]- no beds available  [facility] [phone]- no beds available  [facility] #[phone]-left HIPPA compliant voicemail  [facility], [place]- phone rang, switched to fax [place] -[phone]- will have a bed available next week, was asked to call back  [facility], [facility]- [phone]- has a bed available- FL2 fax to [phone]  [facility] - [phone] rang, switched to fax machine  [place], [place] busy x3  [facility] [phone] accept anyone under the age of 39 years old  [facility] [phone]- no beds available

## 2020-01-22 NOTE — Progress Notes (Signed)
D: Pt alert and oriented. Pt rates depression 4/10, hopelessness 4/10, and anxiety 4/10. Pt reports energy level as normal and concentration as being good. Pt reports sleep last night as being good. Pt did not receive medications for sleep. Pt denies experiencing any pain at this time. Pt denies experiencing any SI/HI, or AVH at this time.   A: Scheduled medications administered to pt, per MD orders. Support and encouragement provided. Frequent verbal contact made. Routine safety checks conducted q15 minutes.   R: No adverse drug reactions noted. Pt verbally contracts for safety at this time. Pt complaint with medications. Pt interacts well with others on the unit. Pt remains safe at this time. Will continue to monitor.

## 2020-01-22 NOTE — Progress Notes (Signed)
Patient has been cooperative on shift. Denies SI, HI and AVH. Patient has been medication compliant on shift. No issues to report on shift at this time.

## 2020-01-22 NOTE — Progress Notes (Addendum)
Recreation Therapy Notes          Ryan Zuniga 01/22/2020 11:37 AM

## 2020-01-22 NOTE — Tx Team (Signed)
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 acetaminophen (TYLENOL) tablet 650 mg  650 mg Oral Q6H PRN [name], [name], NP      . alum & mag hydroxide-simeth (MAALOX/MYLANTA) 200-200-20 MG/5ML suspension 30 mL  30 mL Oral Q4H PRN [name], [name], NP      . ARIPiprazole (ABILIFY) tablet 5 mg  5 mg Oral QHS [name], [name], NP   5 mg at 01/21/20 2118  . doxepin (SINEQUAN) capsule 20 mg  20 mg Oral QHS [name], [name], NP   20 mg at 01/21/20 2118  . folic acid (FOLVITE) tablet 1 mg  1 mg Oral Daily [name], [name], NP   1 mg at 01/22/20 0855  . lamoTRIgine (LAMICTAL) tablet 125 mg  125 mg Oral Daily Anette Riedel M, NP   125 mg at 01/22/20 0854  . lithium carbonate capsule 150 mg  150 mg Oral BID WC [name], [name], NP   150 mg at 01/22/20 0855  . magnesium hydroxide (MILK OF MAGNESIA) suspension 30 mL  30 mL Oral Daily PRN [name], [name], NP      . melatonin tablet 2.5 mg  2.5 mg Oral QHS [name], [name], NP   2.5 mg at 01/21/20 2118  . mirtazapine (REMERON) tablet 30 mg  30 mg Oral QHS [name], NP   30 mg at 01/21/20 2118  . nicotine (NICODERM CQ - dosed in mg/24 hours) patch 21 mg  21 mg Transdermal Daily [name], [name], MD   21 mg at 01/22/20 0855  . trihexyphenidyl (ARTANE) tablet 2 mg  2 mg Oral BID WC [name], [name], NP   2 mg at 01/22/20 1443   PTA Medications: Medications Prior to Admission  Medication Sig Dispense Refill Last Dose  .  ARIPiprazole Lauroxil ER 1064 MG/3.9ML PRSY Inject into the muscle.     . benztropine (COGENTIN) 2 MG tablet Take 2 mg by mouth 2 (two) times daily.     . folic acid (FOLVITE) 1 MG tablet Take 1 mg by mouth daily.     Marland Kitchen LAMICTAL 100 MG tablet Take 100 mg by mouth daily.     . Melatonin 3-10 MG TABS Take 1 tablet by mouth at bedtime.     . mirtazapine (REMERON) 15 MG tablet Take 15 mg by mouth at bedtime.       Patient Stressors: Financial difficulties Medication change or noncompliance  Patient Strengths: Motivation for treatment/growth Religious Affiliation Supportive family/friends  Treatment Modalities: Medication Management, Group therapy, Case management,  1 to 1 session with clinician, Psychoeducation, Recreational therapy.   Physician Treatment Plan for Primary Diagnosis: Adjustment disorder with mixed disturbance of emotions and conduct Long Term Goal(s):     Short Term Goals:    Medication Management: Evaluate patient's response, side effects, and tolerance of medication regimen.  Therapeutic Interventions: 1 to 1 sessions, Unit Group sessions and Medication administration.  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  Therapeutic Interventions: 1 to 1 sessions, Unit Group sessions and Medication administration.  Evaluation of Outcomes: Progressing   RN Treatment Plan for Primary Diagnosis: Adjustment disorder with mixed disturbance of emotions and conduct Long Term Goal(s): Knowledge of disease and therapeutic regimen to maintain health will improve  Short Term Goals:  Ability to demonstrate self-control, Ability to participate in decision making will improve, Ability to verbalize feelings will improve, Ability to identify and develop effective coping behaviors will improve and Compliance with prescribed medications will improve  Medication Management: RN will administer medications as ordered by provider, will assess and evaluate patient's response and provide education to patient for prescribed medication. RN will report any adverse and/or side effects to prescribing provider.  Therapeutic Interventions: 1 on 1 counseling sessions, Psychoeducation, Medication administration, Evaluate responses to treatment, Monitor vital signs and CBGs as ordered, Perform/monitor CIWA, COWS, AIMS and Fall Risk screenings as ordered, Perform wound care treatments as ordered.  Evaluation of Outcomes: Progressing   LCSW Treatment Plan for Primary Diagnosis: Adjustment disorder with mixed disturbance of emotions and conduct Long Term Goal(s): Safe transition to appropriate next level of care at discharge, Engage patient in therapeutic group addressing interpersonal concerns.  Short Term Goals: Engage patient in aftercare planning with referrals and resources, Increase social support, Increase ability to appropriately verbalize feelings, Increase emotional regulation and Increase skills for wellness and recovery  Therapeutic Interventions: Assess for all discharge needs, 1 to 1 time with Social worker, Explore available resources and support systems, Assess for adequacy in community support network, Educate family and significant other(s) on suicide prevention, Complete Psychosocial Assessment, Interpersonal group therapy.  Evaluation of Outcomes: Progressing   Progress in Treatment: Attending groups: No. Participating in groups: No. Taking medication as prescribed: Yes. Toleration medication: Yes. Family/Significant other contact made: Yes, individual(s) contacted:  CSW  completed SPE with pt's guardian.  Patient understands diagnosis: Yes. Discussing patient identified problems/goals with staff: Yes. Medical problems stabilized or resolved: Yes. Denies suicidal/homicidal ideation: Yes. Issues/concerns per patient self-inventory: No. Other: N/a   New problem(s) identified: No, Describe:  None   New Short Term/Long Term Goal(s): Elimination of symptoms of psychosis, medication management for mood stabilization; development of comprehensive mental wellness plan.  Update 01/17/2020:  No changes at this time.  Update 01/22/2020:  No changes at this time.  Patient Goals:  Patient stated that he would like to live somewhere else. Patient does not want to return to his group home. Update 01/17/2020:  No changes at this time. Update 01/22/2020:  No changes at this time.   Discharge Plan or Barriers: CSW will discuss appropriate plan for discharge.  Update 01/17/2020:  Patient is not able to return to his group home.  CSW will assist his guardian as she looks for placement for the patient. Update 01/22/2020:  Guardian reports that she has only attempted to contact 5 homes since July when patient was made aware that he would have to leave the home.  CSW will to continue to assist the patient in identifying placement.   Reason for Continuation of Hospitalization: Hallucinations Medication stabilization  Estimated Length of Stay: TBD   Attendees: Patient:  01/22/2020 10:53 AM  Physician: Dr. Weber Cooks, MD 01/22/2020 10:53 AM  Nursing:  01/22/2020 10:53 AM  RN Care Manager: 01/22/2020 10:53 AM  Social Worker: Assunta Curtis, LCSW 01/22/2020 10:53 AM  Recreational Therapist:  01/22/2020 10:53 AM  Other: Dr. Domingo Cocking, MD 01/22/2020 10:53 AM  Other:  01/22/2020 10:53 AM  Other: 01/22/2020 10:53 AM    Scribe for Treatment Team: Rozann Lesches, LCSW 01/22/2020 10:53 AM
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
## 2020-01-23 DIAGNOSIS — F4325 Adjustment disorder with mixed disturbance of emotions and conduct: Secondary | ICD-10-CM | POA: Diagnosis not present

## 2020-01-23 NOTE — Progress Notes (Signed)
Patient calm and appropriate on the unit and was observed by this Probation officer interacting appropriately with peers and staff. Patient compliant with medication administration per MD orders this evening. Patient denies SI/HI/AVH. Pt presents with intellectual disability. Patient given support, encouragement and education. Patient being monitored Q 15 minutes for safety per unit protocol. Pt remains safe on the unit.Patient instructed to take a shower, was given supplies and clothing

## 2020-01-23 NOTE — Plan of Care (Signed)
Mostly in room. Patient is alert and oriented and denying suicidal/homicidal thoughts. Has been refusing his meals. Reported that he does not like to eat breakfast.  Not attending groups. Patient is otherwise pleasant and cooperative, taking medications.

## 2020-01-23 NOTE — Progress Notes (Signed)
Recreation Therapy Notes   Date: 01/23/2020  Time: 9:30 am   Location: Craft room     Behavioral response: N/A   Intervention Topic: Self-esteem   Discussion/Intervention: Patient did not attend group.   Clinical Observations/Feedback:  Patient did not attend group.   Sary Bogie LRT/CTRS         Ahuva Poynor 01/23/2020 12:18 PM

## 2020-01-23 NOTE — Plan of Care (Signed)
Patient presents at his baseline  Problem: Education: Goal: Emotional status will improve Outcome: Not Progressing Goal: Mental status will improve Outcome: Not Progressing   
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
## 2020-01-23 NOTE — Progress Notes (Signed)
Patient calm and appropriate on the unit. Patient isolative to his room but did come out for snack and medications. Patient compliant with medication administration per MD orders this evening. Patient denies SI/HI/AVH. Pt presents with intellectual disability. Patient given support, encouragement and education. Patient being monitored Q 15 minutes for safety per unit protocol. Pt remains safe on the unit 

## 2020-01-24 DIAGNOSIS — F4325 Adjustment disorder with mixed disturbance of emotions and conduct: Secondary | ICD-10-CM | POA: Diagnosis not present

## 2020-01-24 MED ORDER — HYDROXYZINE HCL 50 MG PO TABS
50.0000 mg | ORAL_TABLET | Freq: Four times a day (QID) | ORAL | Status: DC | PRN
  Administered 2020-01-24 – 2020-02-29 (×7): 50 mg via ORAL
  Filled 2020-01-24 (×7): qty 1
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
## 2020-01-24 NOTE — Plan of Care (Signed)
  Problem: Group Participation Goal: STG - Patient will engage in groups without prompting or encouragement from LRT x3 group sessions within 5 recreation therapy group sessions Description: STG - Patient will engage in groups without prompting or encouragement from LRT x3 group sessions within 5 recreation therapy group sessions Outcome: Not Progressing   

## 2020-01-24 NOTE — Progress Notes (Addendum)
D: Pt alert and oriented. Pt denies experiencing any anxiety/depression at this time. Pt denies experiencing any pain at this time. Pt denies experiencing any SI/HI, or AVH at this time.   Pt did not eat breakfast of lunch today however did eat dinner. Pt had an episode of being agitated. Once given time and prn medication, pt was asked to please come and talk to this writer once calmed down. Pt did come and speak with this Probation officer and apologized to this Probation officer as well as the other staff for his behavior. Pt reported that his mother had made him mad. Pt reported that she told him he should just stay in his room. This Probation officer informed the pt he did not have to stay in his room and that we encourage him to come out attend groups and interact. Pt has since had a good even and is calm and cooperative.  Pt's hygiene is also improved. Pt took a shower of Tuesday and washed his clothing.  A: Scheduled medications administered to pt, per MD orders. Support and encouragement provided. Frequent verbal contact made. Routine safety checks conducted q15 minutes.   R: No adverse drug reactions noted. Pt verbally contracts for safety at this time. Pt complaint with medications and treatment plan. Pt interacts well with others on the unit. Pt remains safe at this time. Will continue to monitor.

## 2020-01-24 NOTE — BHH Counselor (Signed)
CSW spoke with Dorene Sorrow at [facility].  CSW was informed that patient is likely due for his Abilify injection soon.  Assunta Curtis, MSW, LCSW 01/24/2020 1:06 PM

## 2020-01-24 NOTE — BHH Counselor (Signed)
CSW contacted the following in an effort to identify possible placement for the patient:   [facility], [place] group home.   [facility], [phone] No male beds available.   [facility] No group homes listed.   [facility] No group homes listed.   [place] No group homes listed.   [place] No group homes listed.   [place] group homes listed.    [facility], [phone] No male beds available.   [facility], [phone]  Line rang incessantly.  CSW unable to leave HIPAA compliant voicemail.  [facility], [phone] Line rang until it began to sound like a fax machine.   [facility], [phone]  Voicemail box is full.   [facility], [phone] CSW left HIPAA compliant voicemail.   [facility] No group homes listed.   [facility], [phone]  Line rang until it began to sound like a fax machine.  [place], [facility], [phone]  CSW left HIPAA compliant voicemail.  [facility], [phone]  Voicemail box is full.  [facility] No group homes listed.  [facility], [phone] CSW was asked to speak with Mr. Tobin Chad at [phone].  CSW called Mr. Drenda Freeze who reports that CSW needs to follow up with [facility] as this is a part of [place] homes.   [facility], [phone] Line rang then message to enter member ID number.  [facility], [phone]  Line rang incessantly.  CSW unable to leave HIPAA compliant voicemail.  [place], [phone]  Line rang incessantly.  CSW unable to leave HIPAA compliant voicemail.  Assunta Curtis, MSW, LCSW 01/24/2020 12:25 PM

## 2020-01-24 NOTE — Plan of Care (Signed)
Patient presents at his baseline  Problem: Education: Goal: Emotional status will improve Outcome: Not Progressing Goal: Mental status will improve Outcome: Not Progressing   

## 2020-01-24 NOTE — BHH Counselor (Signed)
CSW had been pursuing possible placement with [facility],  She has DECLINED the patient at this time.   Assunta Curtis, MSW, LCSW 01/24/2020 9:03 AM

## 2020-01-24 NOTE — Progress Notes (Signed)
Recreation Therapy Notes    Date: 01/24/2020  Time: 9:30 am   Location: Craft room     Behavioral response: N/A   Intervention Topic: Anger-Management   Discussion/Intervention: Patient did not attend group.   Clinical Observations/Feedback:  Patient did not attend group.   Sabrina Keough LRT/CTRS        Shameeka Silliman 01/24/2020 1:06 PM

## 2020-01-24 NOTE — BHH Counselor (Signed)
CSW faxed referral to [facility] at [facility].  Assunta Curtis, MSW, LCSW 01/24/2020 4:38 PM

## 2020-01-24 NOTE — Progress Notes (Signed)
Patient calm and appropriate on the unit. Patient isolative to his room but did come out for snack and medications. Patient compliant with medication administration per MD orders this evening. Patient denies SI/HI/AVH. Pt presents with intellectual disability. Patient given support, encouragement and education. Patient being monitored Q 15 minutes for safety per unit protocol. Pt remains safe on the unit 

## 2020-01-25 DIAGNOSIS — F4325 Adjustment disorder with mixed disturbance of emotions and conduct: Secondary | ICD-10-CM | POA: Diagnosis not present

## 2020-01-25 MED ORDER — TUBERCULIN PPD 5 UNIT/0.1ML ID SOLN
5.0000 [IU] | Freq: Once | INTRADERMAL | Status: DC
  Filled 2020-01-25: qty 0.1
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
## 2020-01-25 NOTE — BHH Counselor (Signed)
CSW spoke with Theadora Rama with [facility], [phone], the Care Coordinator. She reports some confusion as to why the patient does not have placement at this time. CSW reviewed that patient was scheduled for discharge and planned to return to the home, however, was told that he could not.  CSW reviewed that patient had been given a 30 day notice, however, it was extended and guardian felt that the pt could remain in the home.   CSW spoke with Katharine Look in regards to having additional support in the home for the patient as the previous home had stated that they would take the patient back with additional support.  Katharine Look reports that [facility] will not pay for the services.  CSW asked if there was a higher level of care the patient could go to and was told that this was called a group home high.  She reports that [facility] has one home identified as group home high and that home has months long waiting list.    Katharine Look reported wanting to know if the patient could remain at the hospital for weeks and possibly months and CSW stated that the decision was not hers, however, other entities are concerned with possible length of stay.  Assunta Curtis, MSW, LCSW 01/25/2020 4:00 PM

## 2020-01-25 NOTE — Progress Notes (Signed)
Recreation Therapy Notes   Date: 01/25/2020  Time: 1:00pm   Location: Courtyard   Behavioral response: Appropriate  Group Type: Leisure  Participation level: Active  Communication: Patient was social with peers and staff.  Comments: N/A  [name] LRT/CTRS        Ryan Zuniga 01/25/2020 3:53 PM

## 2020-01-25 NOTE — BHH Counselor (Addendum)
CSW spoke with the patient's guardian [place], [phone].  She reports that there is no emergency housing or housing at all offered by [employer].  She reports that she has "not looked at many places this week" when asked for a list so that CSW did not do double work for patient as placement search continues.  She reports that she has "come to a stop" as the patient now has a Glass blower/designer.    She asked that FL2 be sent to [phone].  CSW sent the information to both [place] and Katharine Look, the patient's Care Coordinator,  CSW received confirmation the fax was successful.    Assunta Curtis, MSW, LCSW 01/25/2020 4:12 PM

## 2020-01-25 NOTE — Plan of Care (Signed)
Pt denies depression, anxiety, SI, HI and AVH. Pt was educated on care plan and verbalizes understanding. Collier Bullock RN  Problem: Education: Goal: Knowledge of [facility] Education information/materials will improve Outcome: Progressing Goal: Emotional status will improve Outcome: Progressing Goal: Mental status will improve Outcome: Progressing Goal: Verbalization of understanding the information provided will improve Outcome: Progressing   Problem: Safety: Goal: Periods of time without injury will increase Outcome: Progressing   Problem: Education: Goal: Ability to state activities that reduce stress will improve Outcome: Progressing

## 2020-01-26 DIAGNOSIS — F4325 Adjustment disorder with mixed disturbance of emotions and conduct: Secondary | ICD-10-CM | POA: Diagnosis not present

## 2020-01-26 NOTE — Plan of Care (Signed)
Pt rates anxiety, depression and hopelessness all at 3/10. Pt denies SI, HI and AVH. Pt was educated on care plan and verbalizes understanding. Collier Bullock RN  Problem: Safety: Goal: Periods of time without injury will increase Outcome: Progressing   Problem: Education: Goal: Knowledge of [facility] Education information/materials will improve Outcome: Progressing Goal: Emotional status will improve Outcome: Progressing Goal: Mental status will improve Outcome: Progressing Goal: Verbalization of understanding the information provided will improve Outcome: Progressing   Problem: Education: Goal: Ability to state activities that reduce stress will improve Outcome: Progressing

## 2020-01-26 NOTE — Progress Notes (Addendum)
Pt states that the reason he has "came here" and "acting out" is because he is hearing voices. He says that he has not told anyone. Collier Bullock RN

## 2020-01-26 NOTE — BHH Counselor (Signed)
   LCSW Group Therapy Note     01/26/2020 1:08-1:45 PM      Type of Therapy and Topic:  Group Therapy:  Overcoming Obstacles     Participation Level:  Active     Description of Group:     In this group patients will be encouraged to explore what they see as obstacles to their own wellness and recovery. They will be guided to discuss their thoughts, feelings, and behaviors related to these obstacles. The group will process together ways to cope with barriers, with attention given to specific choices patients can make. Each patient will be challenged to identify changes they are motivated to make in order to overcome their obstacles. This group will be process-oriented, with patients participating in exploration of their own experiences as well as giving and receiving support and challenge from other group members.     Therapeutic Goals:  1.    Patient will identify personal and current obstacles as they relate to admission.  2.    Patient will identify barriers that currently interfere with their wellness or overcoming obstacles.  3.    Patient will identify feelings, thought process and behaviors related to these barriers.  4.    Patient will identify two changes they are willing to make to overcome these obstacles:        Summary of Patient Progress: Patient checked into group feeling so-so. CSW asked patient what a current obstacle he is going through. Patient stated that he is looking for a woman. Patient stated that he would like to stop doing what he wants all the time. Patient also stated that he wants to stop talking to his family all the time. Patient would like to be more independent.          Therapeutic Modalities:    Cognitive Behavioral Therapy  Solution Focused Therapy  Motivational Interviewing  Relapse Prevention Therapy    Raina Mina, [place]   01/26/2020

## 2020-01-26 NOTE — Progress Notes (Signed)
Patient has been pleasant calm and cooperative. Denies SI HI and AVH. Still isolative. Mainly only comes out of his room to talk on the phone.
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
## 2020-01-26 NOTE — Plan of Care (Signed)
  Problem: Education: Goal: Knowledge of [facility] Education information/materials will improve Outcome: Progressing Goal: Emotional status will improve Outcome: Progressing Goal: Mental status will improve Outcome: Progressing Goal: Verbalization of understanding the information provided will improve Outcome: Progressing   

## 2020-01-27 DIAGNOSIS — F4325 Adjustment disorder with mixed disturbance of emotions and conduct: Secondary | ICD-10-CM | POA: Diagnosis not present
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
## 2020-01-27 NOTE — Progress Notes (Signed)
Patient denies SI, HI and AVH this shift.  Patient has been appropriate on the unit compliant with medications and engaged appropriately in unit activities.   Assess patient for safety, offer medications as prescribed, engage patient in 1:1 staff talks.   Patient able to contract for safety, continue to monitor as planned.

## 2020-01-27 NOTE — Progress Notes (Signed)
Patient refused vital signs. Pushed the door shut and said "no" when I tried to get him for vital signs

## 2020-01-27 NOTE — Plan of Care (Signed)
  Problem: Education: Goal: Knowledge of [facility] Education information/materials will improve Outcome: Progressing Goal: Emotional status will improve Outcome: Progressing Goal: Mental status will improve Outcome: Progressing Goal: Verbalization of understanding the information provided will improve Outcome: Progressing   

## 2020-01-27 NOTE — Progress Notes (Signed)
Patient said he is hearing voices and that the medication is not working. He says they are telling him to hurt people but he is able to contract for safety and denies SI

## 2020-01-27 NOTE — BHH Group Notes (Signed)
[facility] Group Notes: (Clinical Social Work)   01/27/2020      Type of Therapy:  Group Therapy   Participation Level:  Did Not Attend - was invited individually by Nurse/MHT and chose not to attend.   Raina Mina, [place] 01/27/2020  3:36 PM

## 2020-01-28 NOTE — BHH Counselor (Signed)
CSW contacted the following in an effort to identify possible placement for the patient:   [facility], [phone] No male beds available.   [facility], [phone] [facility]   [facility], [phone] CSW left HIPAA compliant voicemail.   [facility], [phone]  CSW left HIPAA compliant voicemail.  [place], [phone]  CSW left HIPAA compliant voicemail.  [facility] No group homes listed.   [facility] No group Homes listed.   [facility] No group home listed.   [place] No group homes listed.   [facility], [phone]  CSW left HIPAA compliant voicemail.   [address] [phone]  No male beds available.  [place], [phone] No male beds available.  [facility], [phone]   CSW left HIPAA compliant voicemail.  [facility] No group homes listed.  [place], [phone] Male beds available.  Asked to send information to [phone] attention Judeth Porch.  [facility], [phone] No male beds available.   [place], [phone]  Line rang without the option of leaving a voicemail.  [place] [facility], [phone]  Male beds available.  Please fax information to [facility] at [phone].  [place], [phone]  No male beds available.   [facility] No group homes listed.  [place] [facility], [phone]  CSW left HIPAA compliant voicemail.  [place] [facility], [phone] Asked to call [phone], Tamaria Mccollum.   CSW called the number provided, however, the voicemail box was full.    [facility], "[facility]", [phone] CSW left HIPAA compliant voicemail.   [facility],  [phone]  Number began to ring like a fax machine after several "normal" rings.  [facility] [place], [phone]  Number began to ring like a fax machine after several "normal" rings.  [facility], [phone]  No male beds available.  [place], [phone] Number began to ring like a fax machine after several "normal" rings.  [facility], [phone] No beds available.    [facility] [place], [phone] Number began to ring like a fax machine after several "normal" rings.   [facility] No group homes listed.   [facility], [phone] Number began to ring like a fax machine after several "normal" rings.   [place], [phone] Line rang incessantly with no option to leave a voicemail.     Assunta Curtis, MSW, LCSW 01/28/2020 4:00 PM

## 2020-01-28 NOTE — Plan of Care (Signed)
  Problem: Education: Goal: Knowledge of [facility] Education information/materials will improve Outcome: Progressing Goal: Emotional status will improve Outcome: Progressing Goal: Mental status will improve Outcome: Progressing Goal: Verbalization of understanding the information provided will improve Outcome: Progressing   

## 2020-01-28 NOTE — Tx Team (Signed)
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 acetaminophen (TYLENOL) tablet 650 mg  650 mg Oral Q6H PRN [name], [name], NP      . alum & mag hydroxide-simeth (MAALOX/MYLANTA) 200-200-20 MG/5ML suspension 30 mL  30 mL Oral Q4H PRN [name], [name], NP      . ARIPiprazole (ABILIFY) tablet 5 mg  5 mg Oral QHS [name], [name], NP   5 mg at 01/27/20 2148  . doxepin (SINEQUAN) capsule 20 mg  20 mg Oral QHS [name], [name], NP   20 mg at 01/27/20 2201  . folic acid (FOLVITE) tablet 1 mg  1 mg Oral Daily [name], [name], NP   1 mg at 01/28/20 0813  . hydrOXYzine (ATARAX/VISTARIL) tablet 50 mg  50 mg Oral Q6H PRN [name], [name], MD   50 mg at 01/26/20 1445  . lamoTRIgine (LAMICTAL) tablet 125 mg  125 mg Oral Daily [name], NP   125 mg at 01/28/20 0812  . lithium carbonate capsule 150 mg  150 mg Oral BID WC [name], [name], NP   150 mg at 01/28/20 0812  . magnesium hydroxide (MILK OF MAGNESIA) suspension 30 mL  30 mL Oral Daily PRN [name], [name], NP      . melatonin tablet 2.5 mg  2.5 mg Oral QHS [name], [name], NP   2.5 mg at 01/27/20 2149  . mirtazapine (REMERON) tablet 30 mg  30 mg Oral QHS [name], [name], NP   30 mg at 01/27/20 2148  . nicotine (NICODERM CQ - dosed in mg/24 hours) patch 21 mg  21 mg Transdermal Daily [name], [name], MD   21 mg at 01/28/20 0813  . trihexyphenidyl (ARTANE) tablet 2 mg  2 mg Oral BID WC [name], [name], NP   2 mg at  01/28/20 4315   PTA Medications: Medications Prior to Admission  Medication Sig Dispense Refill Last Dose  . ARIPiprazole Lauroxil ER 1064 MG/3.9ML PRSY Inject into the muscle.     . benztropine (COGENTIN) 2 MG tablet Take 2 mg by mouth 2 (two) times daily.     . folic acid (FOLVITE) 1 MG tablet Take 1 mg by mouth daily.     Marland Kitchen LAMICTAL 100 MG tablet Take 100 mg by mouth daily.     . Melatonin 3-10 MG TABS Take 1 tablet by mouth at bedtime.     . mirtazapine (REMERON) 15 MG tablet Take 15 mg by mouth at bedtime.       Patient Stressors: Financial difficulties Medication change or noncompliance  Patient Strengths: Motivation for treatment/growth Religious Affiliation Supportive family/friends  Treatment Modalities: Medication Management, Group therapy, Case management,  1 to 1 session with clinician, Psychoeducation, Recreational therapy.   Physician Treatment Plan for Primary Diagnosis: Adjustment disorder with mixed disturbance of emotions and conduct Long Term Goal(s):     Short Term Goals:    Medication Management: Evaluate patient's response, side effects, and tolerance of medication regimen.  Therapeutic Interventions: 1 to 1 sessions, Unit Group sessions and Medication administration.  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  Therapeutic Interventions: 1 to 1 sessions, Unit Group sessions and Medication administration.  Evaluation of Outcomes: Progressing   RN Treatment Plan for Primary Diagnosis: Adjustment disorder with mixed disturbance of emotions and  conduct Long Term Goal(s): Knowledge of disease and therapeutic regimen to maintain health will improve  Short Term Goals: Ability to demonstrate self-control, Ability to participate in decision making will improve, Ability to verbalize feelings will improve, Ability to identify and develop effective coping behaviors will improve and Compliance with prescribed medications will improve  Medication Management: RN will administer medications as ordered by provider, will assess and evaluate patient's response and provide education to patient for prescribed medication. RN will report any adverse and/or side effects to prescribing provider.  Therapeutic Interventions: 1 on 1 counseling sessions, Psychoeducation, Medication administration, Evaluate responses to treatment, Monitor vital signs and CBGs as ordered, Perform/monitor CIWA, COWS, AIMS and Fall Risk screenings as ordered, Perform wound care treatments as ordered.  Evaluation of Outcomes: Progressing   LCSW Treatment Plan for Primary Diagnosis: Adjustment disorder with mixed disturbance of emotions and conduct Long Term Goal(s): Safe transition to appropriate next level of care at discharge, Engage patient in therapeutic group addressing interpersonal concerns.  Short Term Goals: Engage patient in aftercare planning with referrals and resources, Increase social support, Increase ability to appropriately verbalize feelings, Increase emotional regulation and Increase skills for wellness and recovery  Therapeutic Interventions: Assess for all discharge needs, 1 to 1 time with Social worker, Explore available resources and support systems, Assess for adequacy in community support network, Educate family and significant other(s) on suicide prevention, Complete Psychosocial Assessment, Interpersonal group therapy.  Evaluation of Outcomes: Progressing   Progress in Treatment: Attending groups: No. Participating in groups: No. Taking medication as  prescribed: Yes. Toleration medication: Yes. Family/Significant other contact made: Yes, individual(s) contacted:  CSW completed SPE with pt's guardian.  Patient understands diagnosis: Yes. Discussing patient identified problems/goals with staff: Yes. Medical problems stabilized or resolved: Yes. Denies suicidal/homicidal ideation: Yes. Issues/concerns per patient self-inventory: No. Other: N/a   New problem(s) identified: No, Describe:  None   New Short Term/Long Term Goal(s): Elimination of symptoms of psychosis, medication management for mood stabilization; development of comprehensive mental wellness plan.  Update 01/17/2020:  No changes at this time.  Update 01/22/2020:  No changes at this time.  Update 01/28/2020:  No changes at this time.   Patient Goals:  Patient stated that he would like to live somewhere else. Patient does not want to return to his group home. Update 01/17/2020:  No changes at this time. Update 01/22/2020:  No changes at this time.  Update 01/28/2020:  No changes at this time.   Discharge Plan or Barriers: CSW will discuss appropriate plan for discharge.  Update 01/17/2020:  Patient is not able to return to his group home.  CSW will assist his guardian as she looks for placement for the patient. Update 01/22/2020:  Guardian reports that she has only attempted to contact 5 homes since July when patient was made aware that he would have to leave the home.  CSW  will to continue to assist the patient in identifying placement. Update 01/28/2020:  Patient remains on unit pending placement.  CSW will continue to search. Patient now has a Care Coordinator that is assisting. Per [place] will not pay for additional resources and only one Group Home [place] within their catchment area and that has a long wait list.  Patient will remain on the unit until placement is found.  Reason for Continuation of Hospitalization: Hallucinations Medication stabilization  Estimated  Length of Stay: TBD   Attendees: Patient:  01/28/2020 11:39 AM  Physician: Dr. Domingo Cocking, MD 01/28/2020 11:39 AM  Nursing:  01/28/2020 11:39 AM  RN Care Manager: 01/28/2020 11:39 AM  Social Worker: Assunta Curtis, LCSW 01/28/2020 11:39 AM  Recreational Therapist:  01/28/2020 11:39 AM  Other: Trevor Iha, LCSW 01/28/2020 11:39 AM  Other:  01/28/2020 11:39 AM  Other: 01/28/2020 11:39 AM    Scribe for Treatment Team: Rozann Lesches, LCSW 01/28/2020 11:39 AM

## 2020-01-28 NOTE — BHH Counselor (Signed)
CSW contacted Claiborne Billings [phone] at [facility].  He reports that he has not made a decision at this time ore reviewed the patient's referral at this time.    Assunta Curtis, MSW, LCSW 01/28/2020 1:22 PM

## 2020-01-28 NOTE — BHH Group Notes (Signed)
[place] Group Notes:  (Nursing/MHT/Case Management/Adjunct)  Date:  01/28/2020  Time:  9:05 AM  Type of Therapy:  [place]  Participation Level:  Did Not Attend   Summary of Progress/Problems:  Charna Busman 01/28/2020, 9:05 AM
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
## 2020-01-28 NOTE — Progress Notes (Signed)
Recreation Therapy Notes  Date: 01/28/2020  Time: 9:30 am   Location: Craft room     Behavioral response: N/A   Intervention Topic: Self-care   Discussion/Intervention: Patient did not attend group.   Clinical Observations/Feedback:  Patient did not attend group.   Twylah Bennetts LRT/CTRS         Akilah Cureton 01/28/2020 12:19 PM

## 2020-01-28 NOTE — Plan of Care (Signed)
Patient stated that he had a good day.Patient is pleasant and cooperative on approach.Patient stated that he likes to play outside.Denies SI,HI and AVH.Compliant with medications.Personal hygiene maintained.Appetite and energy level good.Support and encouragement given.

## 2020-01-28 NOTE — Progress Notes (Signed)
Patient has been medication compliant. Still hearing voices. Still able to contract for safety. Denies SI and HI.

## 2020-01-29 DIAGNOSIS — F4325 Adjustment disorder with mixed disturbance of emotions and conduct: Secondary | ICD-10-CM | POA: Diagnosis not present

## 2020-01-29 NOTE — BHH Counselor (Signed)
CSW received a return call from Kathlee Nations [phone] from [facility], "[facility]", [phone].  CSW was asked to send fax to [phone].  CSW sent the fax and received confirmation the fax was successful.  Assunta Curtis, MSW, LCSW 01/29/2020 3:02 PM

## 2020-01-29 NOTE — Progress Notes (Signed)
D: Pt alert and oriented. Pt rates depression 3/10, hopelessness 3/10, and anxiety 3/10. Pt reports energy level as normal and concentration as being good. Pt reports sleep last night as being fair. Pt did not receive medications for sleepl. Pt denies experiencing any pain at this time. Pt denies experiencing any SI/HI, or AVH at this time. Pt has been calm and cooperative. Pt reports he only ate dinner was not hungry during breakfast or lunch. Pt has not had any outburst today and reports that he has had a good day.   A: Scheduled medications administered to pt, per MD orders. Support and encouragement provided. Frequent verbal contact made. Routine safety checks conducted q15 minutes.   R: No adverse drug reactions noted. Pt verbally contracts for safety at this time. Pt complaint with medications and treatment plan. Pt interacts well with others on the unit, however still isolates to his room at times standing in the doorway looking out. Pt remains safe at this time. Will continue to monitor.

## 2020-01-29 NOTE — Progress Notes (Signed)
Recreation Therapy Notes  Date: 01/29/2020   Time: 1:30pm   Location: Courtyard    Behavioral response: Appropriate   Group Type: Leisure   Participation level: Active   Communication: Patient was social with peers and staff.   Comments: N/A   Donyetta Ogletree LRT/CTRS        Winnell Bento 01/29/2020 3:11 PM

## 2020-01-29 NOTE — Progress Notes (Signed)
Recreation Therapy Notes  Date: 01/29/2020  Time: 9:30 am   Location: Craft room     Behavioral response: N/A   Intervention Topic: Relaxation    Discussion/Intervention: Patient did not attend group.   Clinical Observations/Feedback:  Patient did not attend group.   Shany Marinez LRT/CTRS           Adric Wrede 01/29/2020 11:29 AM

## 2020-01-29 NOTE — Plan of Care (Signed)
  Problem: Safety: Goal: Periods of time without injury will increase Outcome: Progressing   
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
## 2020-01-30 DIAGNOSIS — F4325 Adjustment disorder with mixed disturbance of emotions and conduct: Secondary | ICD-10-CM | POA: Diagnosis not present

## 2020-01-30 NOTE — Progress Notes (Signed)
Patient denies any SI, HI, AVH. Reports has been having some weird dreams. Only could remember that a woman was coming up to him. Denies any SI, HI, AVH. Isolative to self and room, did come out for staff. Appropriate with staff, minimal interaction with peers. Medication compliant. Meds given as prescribed and taken. Rested well most of shift. Pt remains safe on unit with q 15 min checks.

## 2020-01-30 NOTE — Plan of Care (Signed)
  Problem: Group Participation Goal: STG - Patient will engage in groups without prompting or encouragement from LRT x3 group sessions within 5 recreation therapy group sessions Description: STG - Patient will engage in groups without prompting or encouragement from LRT x3 group sessions within 5 recreation therapy group sessions Outcome: Not Progressing Note: Patient spends his time in his room.

## 2020-01-30 NOTE — BHH Group Notes (Signed)
LCSW Group Therapy Note  01/30/2020 2:59 PM  Type of Therapy/Topic:  Group Therapy:  Emotion Regulation  Participation Level:  Did Not Attend   Description of Group:   The purpose of this group is to assist patients in learning to regulate negative emotions and experience positive emotions. Patients will be guided to discuss ways in which they have been vulnerable to their negative emotions. These vulnerabilities will be juxtaposed with experiences of positive emotions or situations, and patients will be challenged to use positive emotions to combat negative ones. Special emphasis will be placed on coping with negative emotions in conflict situations, and patients will process healthy conflict resolution skills.  Therapeutic Goals: 1. Patient will identify two positive emotions or experiences to reflect on in order to balance out negative emotions 2. Patient will label two or more emotions that they find the most difficult to experience 3. Patient will demonstrate positive conflict resolution skills through discussion and/or role plays  Summary of Patient Progress: X  Therapeutic Modalities:   Cognitive Behavioral Therapy Feelings Identification Dialectical Behavioral Therapy  Assunta Curtis, MSW, LCSW 01/30/2020 2:59 PM
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
## 2020-01-30 NOTE — Progress Notes (Signed)
Recreation Therapy Notes  Date: 01/30/2020  Time: 9:30 am   Location: Craft room     Behavioral response: N/A   Intervention Topic: Problem Solving     Discussion/Intervention: Patient did not attend group.   Clinical Observations/Feedback:  Patient did not attend group.   Olan Kurek LRT/CTRS        Zadia Uhde 01/30/2020 1:33 PM

## 2020-01-30 NOTE — Progress Notes (Signed)
D: Pt alert and oriented. Pt rates depression 2/10, hopelessness 2/10, and anxiety 2/10. Pt reports energy level as normal and concentration as being good. Pt reports sleep last night as being good. Pt did not receive medications for sleep. Pt denies experiencing any pain at this time. Pt denies experiencing any SI/HI, or AVH at this time.   Pt engaged in conversation with this Probation officer asking questions such as how was your night, and how are you doing today? Pt shares he slept well last night. Pt has continued to be calm and cooperative.   A: Scheduled medications administered to pt, per MD orders. Support and encouragement provided. Frequent verbal contact made. Routine safety checks conducted q15 minutes.   R: No adverse drug reactions noted. Pt verbally contracts for safety at this time. Pt complaint with medications. Pt interacts minimally with others on the unit, mostly isolating to room. Pt remains safe at this time. Will continue to monitor.

## 2020-01-31 DIAGNOSIS — F4325 Adjustment disorder with mixed disturbance of emotions and conduct: Secondary | ICD-10-CM | POA: Diagnosis not present

## 2020-01-31 NOTE — Progress Notes (Signed)
Patient has mostly been isolative to his room but he is cooperative with treatment on shift. He denies SI,HI and AVH. He was compliant with medications and had no behavioral issues to report on shift. He appears to be resting in the bed quietly, with no issues to report on shift at this time.

## 2020-01-31 NOTE — Plan of Care (Signed)
Patient has stayed in his room but was also seen in the dayroom. Had breakfast and medications. Attended group then went back to his room. He is pleasant and cooperative on approach. Has no sign of distress. Support and encouragements provided.Safety maintained as expected.

## 2020-01-31 NOTE — Progress Notes (Signed)
Recreation Therapy Notes  Date: 01/31/2020  Time: 9:30 am  Location: Room 21    Behavioral response: Appropriate   Intervention Topic: Animal Assisted Therapy   Discussion/Intervention:  Animal Assisted Therapy took place today during group.  Animal Assisted Therapy is the planned inclusion of an animal in a patient's treatment plan. The patients were able to engage in therapy with an animal during group. Participants were educated on what a service dog is and the different between a support dog and a service dog. Patient were informed on the many animal needs there are and how their needs are similar. Individuals were enlightened on the process to get a service animal or support animal. Patients got the opportunity to pet the animal and were offered emotional support from the animal and staff.  Clinical Observations/Feedback:  Patient came to group and was on topic and was focused on what peers and staff had to say. Participant shared their experiences and history with animals. Individual was social with peers, staff and animal while participating in group.  Sholanda Croson LRT/CTRS         Arcangel Minion 01/31/2020 12:34 PM

## 2020-01-31 NOTE — BHH Counselor (Signed)
CSW resubmitted referral to [facility] at [facility], [phone] for referral.  Assunta Curtis, MSW, LCSW 01/31/2020 11:44 AM

## 2020-01-31 NOTE — BHH Counselor (Signed)
CSW contacted the following in an effort to identify placement:  [facility], [phone] Group home is under [facility].  [place] group homes listed.  [address] [phone] CSW left HIPAA compliant voicemail.   [facility] #[facility], [phone] Line rang without the option of leaving HIPAA compliant voicemail.  [place] #7, [phone] CSW left HIPAA compliant voicemail.   [facility] #2, [phone] Line rang then was disconnected.  CSW contacted several times.   [place], [phone] Asked to call [phone] and speak with Claiborne Billings or [place].  Has one semi-private room and one single room. Asked to fax the information to [phone].  Patient DECLINED due to smoking.  [place] [phone], [phone] CSW left HIPAA compliant voicemail.   [facility] No group homes listed.   [facility] No group homes listed.   [place], [phone] CSW left HIPAA compliant voicemail.   [facility], [phone] CSW left HIPAA compliant voicemail.   [facility] No group homes listed.   [place], [phone] Number listed is for some type of business.   [facility] #5, [phone] CSW asked to call [phone] [place]. Asked to fax information to [phone].  [facility], [phone] Call could not be completed as dialed.  [facility], [phone] CSW left HIPAA compliant voicemail.   [facility], [phone] Possible bed available.  Asked to send [phone].  [facility] No group homes listed.

## 2020-01-31 NOTE — BHH Group Notes (Signed)
LCSW Group Therapy Note  01/31/2020 2:36 PM  Type of Therapy/Topic:  Group Therapy:  Balance in Life  Participation Level:  Did Not Attend  Description of Group:    This group will address the concept of balance and how it feels and looks when one is unbalanced. Patients will be encouraged to process areas in their lives that are out of balance and identify reasons for remaining unbalanced. Facilitators will guide patients in utilizing problem-solving interventions to address and correct the stressor making their life unbalanced. Understanding and applying boundaries will be explored and addressed for obtaining and maintaining a balanced life. Patients will be encouraged to explore ways to assertively make their unbalanced needs known to significant others in their lives, using other group members and facilitator for support and feedback.  Therapeutic Goals: 1. Patient will identify two or more emotions or situations they have that consume much of in their lives. 2. Patient will identify signs/triggers that life has become out of balance:  3. Patient will identify two ways to set boundaries in order to achieve balance in their lives:  4. Patient will demonstrate ability to communicate their needs through discussion and/or role plays  Summary of Patient Progress: X  Therapeutic Modalities:   Cognitive Behavioral Therapy Solution-Focused Therapy Assertiveness Training  Assunta Curtis MSW, LCSW 01/31/2020 2:36 PM

## 2020-01-31 NOTE — BHH Counselor (Signed)
CSW attempted to call Kathlee Nations [phone] from [facility], "[facility]", [phone] to check on the status of the patient's referral.  CSW left HIPAA compliant voicemail.  Assunta Curtis, MSW, LCSW 01/31/2020 10:59 AM
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
## 2020-01-31 NOTE — Plan of Care (Signed)
Patient presents at his baseline  Problem: Education: Goal: Emotional status will improve Outcome: Not Progressing Goal: Mental status will improve Outcome: Not Progressing   

## 2020-01-31 NOTE — Progress Notes (Signed)
Patient calm and appropriate on the unit. Patient isolative to his room but did come out for snack and medications. Patient compliant with medication administration per MD orders this evening. Patient denies SI/HI/AVH. Pt presents with intellectual disability. Patient given support, encouragement and education. Patient being monitored Q 15 minutes for safety per unit protocol. Pt remains safe on the unit 

## 2020-02-01 DIAGNOSIS — F4325 Adjustment disorder with mixed disturbance of emotions and conduct: Secondary | ICD-10-CM | POA: Diagnosis not present
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
## 2020-02-01 NOTE — Progress Notes (Signed)
Recreation Therapy Notes   Date: 02/01/2020  Time: 9:30 am   Location: Craft room     Behavioral response: N/A   Intervention Topic: Stress Management     Discussion/Intervention: Patient did not attend group.   Clinical Observations/Feedback:  Patient did not attend group.   Para Cossey LRT/CTRS        Kerrilynn Derenzo 02/01/2020 1:20 PM

## 2020-02-01 NOTE — BHH Counselor (Signed)
CSW spoke with the patient's care Coordinator, Theadora Rama with [facility], [phone].  She reports that she has reached out to the following counties: [place], [place], [place] and [place].  She reports that per [facility] has contacted: [place], [place], [place] Coordinator reports that she has not heard from the pt's guardian for an update.  CSW did call patient's guardian, [name], [phone].  Per legal guardian she has "reached out to '[facility] this week'".  She was curious if the group home owner could come onto the unit to visit with the patient. CSW reported that at this time visitors are not allowed on the unit, however, phone calls/WebEx/FaceTime can be arranged.  She reports that she will follow up with the owner and call this CSW back.  CSW asked for a list of homes/counties contacted and guardian was unable to produce one.  She reports that she will gather her information and provide it later. She again reiterated that she has only worked on [employer] this week.  She was curious if CSW asked care coordinator about one to one support and group home high. CSW provided her with the update that neither are options at this time, per previous discussion with Care Coordinator.    CSW Heath Lark attempted to reach pt's ACTT team and was unable to and left HIPAA compliant voicemail.  Assunta Curtis, MSW, LCSW 02/01/2020 11:56 AM

## 2020-02-01 NOTE — Plan of Care (Signed)
D- Patient alert and oriented. Patient presented in a pleasant mood on assessment stating that he slept "all night long" last night and had no complaints to voice to this Probation officer. Patient denied SI, HI, AVH, and pain at this time. Patient also denied any signs/symptoms of depression and anxiety, however, he stated that he does have "things on my mind", but he did not elaborate on what he was thinking about. Patient had no stated goals for today.  A- Scheduled medications administered to patient, per MD orders. Support and encouragement provided.  Routine safety checks conducted every 15 minutes.  Patient informed to notify staff with problems or concerns.  R- No adverse drug reactions noted. Patient contracts for safety at this time. Patient compliant with medications and treatment plan. Patient receptive, calm, and cooperative. Patient interacts well with others on the unit.  Patient remains safe at this time.  Problem: Education: Goal: Knowledge of [place] [facility] Education information/materials will improve Outcome: Progressing Goal: Emotional status will improve Outcome: Progressing Goal: Mental status will improve Outcome: Progressing Goal: Verbalization of understanding the information provided will improve Outcome: Progressing   Problem: Safety: Goal: Periods of time without injury will increase Outcome: Progressing   Problem: Education: Goal: Ability to state activities that reduce stress will improve Outcome: Progressing

## 2020-02-01 NOTE — Care Management (Signed)
Mental Health Facilities contacted in search of placement- Follow up on 01/31/20  [facility], [phone]- will have a bed in about 1 week-FL2 faxed to [phone]-Patient is from this facility and can not return back  [place]- no beds available  [facility], [facility]- [phone]- has a bed available- FL2 faxed to [phone] answer, fax not received- voicemail full at [phone], [phone] number not in service

## 2020-02-02 DIAGNOSIS — F4325 Adjustment disorder with mixed disturbance of emotions and conduct: Secondary | ICD-10-CM | POA: Diagnosis not present

## 2020-02-02 NOTE — Plan of Care (Signed)
  Problem: Education: Goal: Knowledge of [place] [facility] Education information/materials will improve Outcome: Progressing Goal: Emotional status will improve Outcome: Progressing Goal: Mental status will improve Outcome: Progressing Goal: Verbalization of understanding the information provided will improve Outcome: Progressing   
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
## 2020-02-02 NOTE — [facility] Group Notes (Signed)
[place] Group Notes: (Clinical Social Work)   02/02/2020      Type of Therapy:  Group Therapy   Participation Level:  Did Not Attend - was invited individually by Nurse/MHT and chose not to attend.   Raina Mina, LCSWA 02/02/2020  1:16 PM

## 2020-02-02 NOTE — Progress Notes (Signed)
Patient has been calm and cooperative. Denies SI, HI and AVH. Medication compliant. Isolative to room

## 2020-02-02 NOTE — Plan of Care (Signed)
Mostly in room but reports no concerns. Did not eat lunch reporting that he was not hungry

## 2020-02-02 NOTE — Tx Team (Signed)
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 acetaminophen (TYLENOL) tablet 650 mg  650 mg Oral Q6H PRN [name], [name], NP      . alum & mag hydroxide-simeth (MAALOX/MYLANTA) 200-200-20 MG/5ML suspension 30 mL  30 mL Oral Q4H PRN [name], [name], NP      . ARIPiprazole (ABILIFY) tablet 5 mg  5 mg Oral QHS [name], [name], NP   5 mg at 02/01/20 2124  . doxepin (SINEQUAN) capsule 20 mg  20 mg Oral QHS [name], [name], NP   20 mg at 02/01/20 2124  . folic acid (FOLVITE) tablet 1 mg  1 mg Oral Daily [name], [name], NP   1 mg at 02/02/20 0920  . hydrOXYzine (ATARAX/VISTARIL) tablet 50 mg  50 mg Oral Q6H PRN [name], [name], MD   50 mg at 01/28/20 2130  . lamoTRIgine (LAMICTAL) tablet 125 mg  125 mg Oral Daily [name], [name], NP   125 mg at 02/02/20 0920  . lithium carbonate capsule 150 mg  150 mg Oral BID WC [name], [name], NP   150 mg at 02/02/20 0920  . magnesium hydroxide (MILK OF MAGNESIA) suspension 30 mL  30 mL Oral Daily PRN [name], [name], NP      . melatonin tablet 2.5 mg  2.5 mg Oral QHS [name], [name], NP   2.5 mg at 02/01/20 2124  . mirtazapine (REMERON) tablet 30 mg  30 mg Oral QHS [name], [name], NP   30 mg at 02/01/20 2124  . nicotine (NICODERM CQ - dosed in mg/24 hours) patch 21 mg  21 mg Transdermal Daily [name], [name], MD   21 mg at 02/02/20 0920  . trihexyphenidyl (ARTANE) tablet 2 mg  2 mg Oral BID WC [name], [name], NP   2 mg at  02/02/20 0920   PTA Medications: Medications Prior to Admission  Medication Sig Dispense Refill Last Dose  . ARIPiprazole Lauroxil ER 1064 MG/3.9ML PRSY Inject into the muscle.     . benztropine (COGENTIN) 2 MG tablet Take 2 mg by mouth 2 (two) times daily.     . folic acid (FOLVITE) 1 MG tablet Take 1 mg by mouth daily.     Marland Kitchen LAMICTAL 100 MG tablet Take 100 mg by mouth daily.     . Melatonin 3-10 MG TABS Take 1 tablet by mouth at bedtime.     . mirtazapine (REMERON) 15 MG tablet Take 15 mg by mouth at bedtime.       Patient Stressors: Financial difficulties Medication change or noncompliance  Patient Strengths: Motivation for treatment/growth Religious Affiliation Supportive family/friends  Treatment Modalities: Medication Management, Group therapy, Case management,  1 to 1 session with clinician, Psychoeducation, Recreational therapy.   Physician Treatment Plan for Primary Diagnosis: Adjustment disorder with mixed disturbance of emotions and conduct Long Term Goal(s):     Short Term Goals:    Medication Management: Evaluate patient's response, side effects, and tolerance of medication regimen.  Therapeutic Interventions: 1 to 1 sessions, Unit Group sessions and Medication  administration.  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  Therapeutic Interventions: 1 to 1 sessions, Unit Group sessions and Medication administration.  Evaluation of Outcomes: Progressing   RN Treatment Plan for Primary Diagnosis: Adjustment disorder with mixed disturbance of emotions and  conduct Long Term Goal(s): Knowledge of disease and therapeutic regimen to maintain health will improve  Short Term Goals: Ability to demonstrate self-control, Ability to participate in decision making will improve, Ability to verbalize feelings will improve, Ability to identify and develop effective coping behaviors will improve and Compliance with prescribed medications will improve  Medication Management: RN will administer medications as ordered by provider, will assess and evaluate patient's response and provide education to patient for prescribed medication. RN will report any adverse and/or side effects to prescribing provider.  Therapeutic Interventions: 1 on 1 counseling sessions, Psychoeducation, Medication administration, Evaluate responses to treatment, Monitor vital signs and CBGs as ordered, Perform/monitor CIWA, COWS, AIMS and Fall Risk screenings as ordered, Perform wound care treatments as ordered.  Evaluation of Outcomes: Progressing   LCSW Treatment Plan for Primary Diagnosis: Adjustment disorder with mixed disturbance of emotions and conduct Long Term Goal(s): Safe transition to appropriate next level of care at discharge, Engage patient in therapeutic group addressing interpersonal concerns.  Short Term Goals: Engage patient in aftercare planning with referrals and resources, Increase social support, Increase ability to appropriately verbalize feelings, Increase emotional regulation and Increase skills for wellness and recovery  Therapeutic Interventions: Assess for all discharge needs, 1 to 1 time with Social worker, Explore available resources and support systems, Assess for adequacy in community support network, Educate family and significant other(s) on suicide prevention, Complete Psychosocial Assessment, Interpersonal group therapy.  Evaluation of Outcomes: Progressing   Progress in Treatment: Attending groups: No. Participating in groups: No. Taking medication as  prescribed: Yes. Toleration medication: Yes. Family/Significant other contact made: Yes, individual(s) contacted:  [name], Legal Guardian  Patient understands diagnosis: Yes. Discussing patient identified problems/goals with staff: Yes. Medical problems stabilized or resolved: Yes. Denies suicidal/homicidal ideation: Yes. Issues/concerns per patient self-inventory: No. Other: N/a  New problem(s) identified: No, Describe:  None  New Short Term/Long Term Goal(s): Elimination of symptoms of psychosis, medication management for mood stabilization; development of comprehensive mental wellness plan.  Update 01/17/2020:  No changes at this time.  Update 01/22/2020:  No changes at this time.  Update 01/28/2020:  No changes at this time. Update 02/02/2020: No changes   Patient Goals:  Patient stated that he would like to live somewhere else. Patient does not want to return to his group home. Update 01/17/2020:  No changes at this time. Update 01/22/2020:  No changes at this time.  Update 01/28/2020:  No changes at this time. Update 02/02/2020: No changes   Discharge Plan or Barriers: CSW will discuss appropriate plan for discharge.  Update 01/17/2020:  Patient is not able to return to his group home.  CSW will assist his guardian as she looks for placement for the patient. Update 01/22/2020:  Guardian reports that she has only attempted to contact 5 homes since July when patient was made aware that he would have  to leave the home.  CSW will to continue to assist the patient in identifying placement. Update 01/28/2020:  Patient remains on unit pending placement.  CSW will continue to search. Patient now has a Care Coordinator that is assisting. Per [place] will not pay for additional resources and only one Group Home high within their catchment area and that has a long wait list.  Patient will remain on the unit until placement is found. Update 02/02/2020: CSW continues to look for  placement options.  Reason for Continuation of Hospitalization: Hallucinations Medication stabilization   Estimated Length of Stay:TBD  Attendees: Patient: 02/02/2020 10:39 AM  Physician: Dr. Weber Cooks, MD 02/02/2020 10:39 AM  Nursing:  02/02/2020 10:39 AM  RN Care Manager: 02/02/2020 10:39 AM  Social Worker: Raina Mina, [place] 02/02/2020 10:39 AM  Recreational Therapist:  02/02/2020 10:39 AM  Other:  02/02/2020 10:39 AM  Other:  02/02/2020 10:39 AM  Other: 02/02/2020 10:39 AM    Scribe for Treatment Team: Raina Mina, [place] 02/02/2020 10:39 AM

## 2020-02-03 DIAGNOSIS — F4325 Adjustment disorder with mixed disturbance of emotions and conduct: Secondary | ICD-10-CM | POA: Diagnosis not present

## 2020-02-03 NOTE — [facility] Group Notes (Signed)
LCSW Aftercare Discharge Planning Group Note   02/03/2020 1:20-2:15 PM   Type of Group and Topic: Psychoeducational Group:  Discharge Planning  Participation Level:  Active  Description of Group  Discharge planning group reviews patient's anticipated discharge plans and assists patients to anticipate and address any barriers to wellness/recovery in the community.  Suicide prevention education is reviewed with patients in group.  Therapeutic Goals 1. Patients will state their anticipated discharge plan and mental health aftercare 2. Patients will identify potential barriers to wellness in the community setting 3. Patients will engage in problem solving, solution focused discussion of ways to anticipate and address barriers to wellness/recovery  Summary of Patient Progress: Patient came outside at the end. Patient sat on the bench by his self. Patient was smiling and stated he was doing pretty good today. Patient requested songs but CSW was unable to find them.        Raina Mina, [place] 02/03/2020 2:56 PM

## 2020-02-03 NOTE — Progress Notes (Signed)
Calm and cooperative. Hearing voices, will not say what they are saying, but is able to contract for safety

## 2020-02-03 NOTE — Plan of Care (Signed)
Patient got up and ate breakfast then went back to his room. Cooperative and pleasant on approach. Has no concerns. Had AM medications and was encouraged to talk to staff as needed. Safety monitored as needed.

## 2020-02-03 NOTE — Progress Notes (Signed)
Patient spent the majority of the shift in room but was out for meals. Went outside with peers but did not get involved in activities. Had no concerns throughout this shift.

## 2020-02-03 NOTE — Plan of Care (Signed)
  Problem: Education: Goal: Knowledge of [place] [facility] Education information/materials will improve Outcome: Progressing Goal: Emotional status will improve Outcome: Progressing Goal: Mental status will improve Outcome: Progressing Goal: Verbalization of understanding the information provided will improve Outcome: Progressing   
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
## 2020-02-04 NOTE — Plan of Care (Signed)
Patient isolated in his room most of the time but pleasant and cooperative on approach.Denies SI,HI and AVH.Compliant with medications.Went outside for sometime.Appetite and energy level good.Support and encouragement given.

## 2020-02-04 NOTE — Progress Notes (Signed)
Patient has been quiet, calm and cooperative. Denies SI and HI. Says he is hearing voices but is able to contract for safety

## 2020-02-04 NOTE — BHH Counselor (Signed)
CSW attempted to contact [facility] at [facility], [phone].  CSW left HIPAA compliant voicemail.  Assunta Curtis, MSW, LCSW 02/04/2020 10:01 AM

## 2020-02-04 NOTE — Plan of Care (Signed)
  Problem: Education: Goal: Knowledge of [facility] Education information/materials will improve Outcome: Progressing Goal: Emotional status will improve Outcome: Progressing Goal: Mental status will improve Outcome: Progressing Goal: Verbalization of understanding the information provided will improve Outcome: Progressing   
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
## 2020-02-04 NOTE — BHH Group Notes (Signed)
LCSW Group Therapy Note   02/04/2020 3:09 PM  Type of Therapy and Topic:  Group Therapy:  Overcoming Obstacles   Participation Level:  Did Not Attend   Description of Group:    In this group patients will be encouraged to explore what they see as obstacles to their own wellness and recovery. They will be guided to discuss their thoughts, feelings, and behaviors related to these obstacles. The group will process together ways to cope with barriers, with attention given to specific choices patients can make. Each patient will be challenged to identify changes they are motivated to make in order to overcome their obstacles. This group will be process-oriented, with patients participating in exploration of their own experiences as well as giving and receiving support and challenge from other group members.   Therapeutic Goals: 1. Patient will identify personal and current obstacles as they relate to admission. 2. Patient will identify barriers that currently interfere with their wellness or overcoming obstacles.  3. Patient will identify feelings, thought process and behaviors related to these barriers. 4. Patient will identify two changes they are willing to make to overcome these obstacles:      Summary of Patient Progress X   Therapeutic Modalities:   Cognitive Behavioral Therapy Solution Focused Therapy Motivational Interviewing Relapse Prevention Therapy  Ryan Zuniga, MSW, LCSW 02/04/2020 3:09 PM

## 2020-02-04 NOTE — Progress Notes (Signed)
Recreation Therapy Notes  Date: 02/04/2020  Time: 9:30 am   Location: Craft room     Behavioral response: N/A   Intervention Topic: Creative expressions    Discussion/Intervention: Patient did not attend group.   Clinical Observations/Feedback:  Patient did not attend group.   Frederich Montilla LRT/CTRS        Saleah Rishel 02/04/2020 11:53 AM

## 2020-02-04 NOTE — BHH Counselor (Signed)
CSW contacted the following in an effort to obtain placement:  [facility], [place], [phone], [place], [phone], [place], [phone]. [place], [phone]  CSW was asked to send FL2 and H&P to [phone] [facility]ention [facility].  CSW was informed that all NOA are with the same managers and this is the main line.  Tobin Chad and [place] #[facility], [phone] CSW left HIPAA compliant voicemail.  Tobin Chad and [place] #6, [phone] CSW left HIPAA compliant voicemail.  Tobin Chad and [place] #8, [phone] CSW left HIPAA compliant voicemail.  Tobin Chad and [place] #3, [phone] CSW left HIPAA compliant voicemail.  Tobin Chad and [place] #7, [phone] CSW left HIPAA compliant voicemail.  Tobin Chad and [place] #5, [phone] CSW left HIPAA compliant voicemail.  Tobin Chad and [place] #2, [phone] CSW spoke with resident who reports that he went to ask staff and staff told him to "hang the phone up".  Line was disconnected.  [place], [place], [phone] Line rang without the option to leave a voicemail message.  Dorris Singh, [phone] CSW unable to leave HIPAA compliant voicemail, voicemail box is full.   Salley Scarlet, [place], [phone] CSW left HIPAA compliant voicemail.  Randal Buba, [place], [phone] No male beds available.   Barrington Ellison, [phone] Two openings, will call back with daughter who runs the homes as well to further discuss.  Alla Feeling, [place], [phone] Declined to discuss the gentlemen for the beds.  Lavada Mesi, [phone] Number did not appear to be in working order.   Dan Maker, [phone] Male beds available [phone] Attention to [facility].  Marinell Blight, [phone] Male bed will become available in 2 weeks. Fax [phone]   Constance Holster [phone] No male beds available.     Chrystie Nose, [place], [phone] CSW left HIPAA compliant voicemail.  Dellis Anes, [phone] Beds available, asked for information to be emailed to [email or web address].  If accepted patient will need to go to a [facility] program.     Terri Skains, [phone] Asked for Fl2 to be sent to [phone].  Monico Blitz, [place], [phone] No male beds available.    CSW received confirmation the faxes were successful.  Assunta Curtis, MSW, LCSW 02/04/2020 12:22 PM

## 2020-02-05 NOTE — Progress Notes (Signed)
D- Patient alert and oriented. Affect/mood is calm, cooperative and withdrawn except for mealtime. Pt denies SI, HI, AVH, and pain. Pt was encouraged to attend groups and educated on the benefits however did not go.   A- Scheduled medications administered to patient, per MD orders. Support and encouragement provided.  Routine safety checks conducted every 15 minutes.  Patient informed to notify staff with problems or concerns.  R- No adverse drug reactions noted. Patient contracts for safety at this time. Patient compliant with medications and treatment plan. Patient receptive, calm, and cooperative. Patient interacts well with others on the unit.  Patient remains safe at this time.  Collier Bullock RN

## 2020-02-05 NOTE — Plan of Care (Signed)
Pt rates depression and anxiety both 2/10. Pt denies SI, HI and AVH. Pt was educated on care plan and verbalizes understanding. Pt was encouraged to attend groups. Collier Bullock RN Problem: Education: Goal: Knowledge of [place] [facility] Education information/materials will improve Outcome: Progressing Goal: Emotional status will improve Outcome: Progressing Goal: Mental status will improve Outcome: Progressing Goal: Verbalization of understanding the information provided will improve Outcome: Progressing   Problem: Safety: Goal: Periods of time without injury will increase Outcome: Progressing   Problem: Education: Goal: Ability to state activities that reduce stress will improve Outcome: Progressing

## 2020-02-05 NOTE — Plan of Care (Signed)
  Problem: Education: Goal: Knowledge of [place] [facility] Education information/materials will improve Outcome: Progressing Goal: Emotional status will improve Outcome: Progressing Goal: Mental status will improve Outcome: Progressing Goal: Verbalization of understanding the information provided will improve Outcome: Progressing   

## 2020-02-05 NOTE — Progress Notes (Signed)
Patient has been pleasant and cooperative. Denies SI and HI. Says he occasionally hears voices but is not hearing them currently and is able to contract for safety
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
## 2020-02-05 NOTE — BHH Counselor (Signed)
LCSW Group Therapy Note  02/05/2020 2:15 PM  Type of Therapy/Topic:  Group Therapy:  Feelings about Diagnosis  Participation Level:  Did Not Attend   Description of Group:   This group will allow patients to explore their thoughts and feelings about diagnoses they have received. Patients will be guided to explore their level of understanding and acceptance of these diagnoses. Facilitator will encourage patients to process their thoughts and feelings about the reactions of others to their diagnosis and will guide patients in identifying ways to discuss their diagnosis with significant others in their lives. This group will be process-oriented, with patients participating in exploration of their own experiences, giving and receiving support, and processing challenge from other group members.   Therapeutic Goals: 1. Patient will demonstrate understanding of diagnosis as evidenced by identifying two or more symptoms of the disorder 2. Patient will be able to express two feelings regarding the diagnosis 3. Patient will demonstrate their ability to communicate their needs through discussion and/or role play  Summary of Patient Progress: X   Therapeutic Modalities:   Cognitive Behavioral Therapy Brief Therapy Feelings Identification   Johannah Rozas R. Guerry Bruin, MSW, LCSW, [place] 02/05/2020 2:15 PM

## 2020-02-05 NOTE — Progress Notes (Signed)
Recreation Therapy Notes   Date: 02/05/2020  Time: 9:30 am   Location: Craft room     Behavioral response: N/A   Intervention Topic: Coping Skills   Discussion/Intervention: Patient did not attend group.   Clinical Observations/Feedback:  Patient did not attend group.   Brigitta Pricer LRT/CTRS        Ryan Zuniga 02/05/2020 12:29 PM

## 2020-02-06 NOTE — BHH Counselor (Signed)
LCSW Group Therapy Note  02/06/2020 2:08 PM  Type of Therapy/Topic:  Group Therapy:  Emotion Regulation  Participation Level:  Did Not Attend   Description of Group:   The purpose of this group is to assist patients in learning to regulate negative emotions and experience positive emotions. Patients will be guided to discuss ways in which they have been vulnerable to their negative emotions. These vulnerabilities will be juxtaposed with experiences of positive emotions or situations, and patients will be challenged to use positive emotions to combat negative ones. Special emphasis will be placed on coping with negative emotions in conflict situations, and patients will process healthy conflict resolution skills.  Therapeutic Goals: 1. Patient will identify two positive emotions or experiences to reflect on in order to balance out negative emotions 2. Patient will label two or more emotions that they find the most difficult to experience 3. Patient will demonstrate positive conflict resolution skills through discussion and/or role plays  Summary of Patient Progress: X  Therapeutic Modalities:   Cognitive Behavioral Therapy Feelings Identification Dialectical Behavioral Therapy  Assunta Curtis, MSW, LCSW 02/06/2020 2:08 PM

## 2020-02-06 NOTE — Progress Notes (Signed)
Patient slept through the night without incident. Was safe on the unit with 15 minute safety checks.      Cleo Butler-Nicholson, LPN

## 2020-02-06 NOTE — Progress Notes (Signed)
Recreation Therapy Notes    Date: 02/06/2020  Time: 9:30 am   Location: Craft room     Behavioral response: N/A   Intervention Topic: Teamwork   Discussion/Intervention: Patient did not attend group.   Clinical Observations/Feedback:  Patient did not attend group.   [name] LRT/CTRS        Ryan Zuniga 02/06/2020 12:02 PM

## 2020-02-06 NOTE — BHH Counselor (Signed)
CSW faxed FL2 to Chrystie Nose, [phone] f [phone].  CSW received confirmation the fax was successful.  Assunta Curtis, MSW, LCSW 02/06/2020 3:24 PM

## 2020-02-06 NOTE — Plan of Care (Signed)
Pt rates depression, anxiety and hopelessness all at 4/10. Pt was educated on care plan and verbalizes understanding. Pt was encouraged to attend groups. Collier Bullock RN Problem: Education: Goal: Freight forwarder Education information/materials will improve Outcome: Progressing Goal: Emotional status will improve Outcome: Progressing Goal: Mental status will improve Outcome: Progressing Goal: Verbalization of understanding the information provided will improve Outcome: Progressing   Problem: Safety: Goal: Periods of time without injury will increase Outcome: Progressing   Problem: Education: Goal: Knowledge of [place] [facility] Education information/materials will improve Outcome: Progressing Goal: Emotional status will improve Outcome: Progressing Goal: Mental status will improve Outcome: Progressing Goal: Verbalization of understanding the information provided will improve Outcome: Progressing   Problem: Activity: Goal: Interest or engagement in activities will improve Outcome: Progressing Goal: Sleeping patterns will improve Outcome: Progressing   Problem: Coping: Goal: Ability to verbalize frustrations and anger appropriately will improve Outcome: Progressing Goal: Ability to demonstrate self-control will improve Outcome: Progressing   Problem: Health Behavior/Discharge Planning: Goal: Identification of resources available to assist in meeting health care needs will improve Outcome: Progressing Goal: Compliance with treatment plan for underlying cause of condition will improve Outcome: Progressing   Problem: Physical Regulation: Goal: Ability to maintain clinical measurements within normal limits will improve Outcome: Progressing   Problem: Safety: Goal: Periods of time without injury will increase Outcome: Progressing   Problem: Education: Goal: Ability to state activities that reduce stress will improve Outcome: Progressing

## 2020-02-06 NOTE — Progress Notes (Signed)
D- Patient alert and oriented. Pt affect/mood is calm cooperative and withdrawn. Pt denies SI, HI, AVH, and pain. Pt was encouraged to attend groups and come out to dayroom however he did not.   A- Scheduled medications administered to patient, per MD orders. Support and encouragement provided.  Routine safety checks conducted every 15 minutes.  Patient informed to notify staff with problems or concerns.  R- No adverse drug reactions noted. Patient contracts for safety at this time. Patient compliant with medications and treatment plan. Patient receptive, calm, and cooperative. Patient interacts well with others on the unit.  Patient remains safe at this time.  Collier Bullock RN
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
## 2020-02-07 NOTE — [facility] Group Notes (Signed)
LCSW Group Therapy Note  02/07/2020 2:49 PM  Type of Therapy/Topic:  Group Therapy:  Balance in Life  Participation Level:  Did Not Attend  Description of Group:    This group will address the concept of balance and how it feels and looks when one is unbalanced. Patients will be encouraged to process areas in their lives that are out of balance and identify reasons for remaining unbalanced. Facilitators will guide patients in utilizing problem-solving interventions to address and correct the stressor making their life unbalanced. Understanding and applying boundaries will be explored and addressed for obtaining and maintaining a balanced life. Patients will be encouraged to explore ways to assertively make their unbalanced needs known to significant others in their lives, using other group members and facilitator for support and feedback.  Therapeutic Goals: 1. Patient will identify two or more emotions or situations they have that consume much of in their lives. 2. Patient will identify signs/triggers that life has become out of balance:  3. Patient will identify two ways to set boundaries in order to achieve balance in their lives:  4. Patient will demonstrate ability to communicate their needs through discussion and/or role plays  Summary of Patient Progress: X  Therapeutic Modalities:   Cognitive Behavioral Therapy Solution-Focused Therapy Assertiveness Training  Hedy Camara R. Guerry Bruin, MSW, [place], [place] 02/07/2020 2:49 PM

## 2020-02-07 NOTE — Tx Team (Signed)
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 acetaminophen (TYLENOL) tablet 650 mg  650 mg Oral Q6H PRN [name], [name], NP      . alum & mag hydroxide-simeth (MAALOX/MYLANTA) 200-200-20 MG/5ML suspension 30 mL  30 mL Oral Q4H PRN [name], [name], NP      . ARIPiprazole (ABILIFY) tablet 5 mg  5 mg Oral QHS [name], [name], NP   5 mg at 02/06/20 2106  . doxepin (SINEQUAN) capsule 20 mg  20 mg Oral QHS [name], [name], NP   20 mg at 02/06/20 2106  . folic acid (FOLVITE) tablet 1 mg  1 mg Oral Daily [name], [name], NP   1 mg at 02/07/20 0900  . hydrOXYzine (ATARAX/VISTARIL) tablet 50 mg  50 mg Oral Q6H PRN [name], [name], MD   50 mg at 01/28/20 2130  . lamoTRIgine (LAMICTAL) tablet 125 mg  125 mg Oral Daily [name], [name], NP   125 mg at 02/07/20 0900  . lithium carbonate capsule 150 mg  150 mg Oral BID WC [name], [name], NP   150 mg at 02/07/20 0900  . magnesium hydroxide (MILK OF MAGNESIA) suspension 30 mL  30 mL Oral Daily PRN [name], [name], NP      . melatonin tablet 2.5 mg  2.5 mg Oral QHS [name], [name], NP   2.5 mg at 02/06/20 2106  . mirtazapine (REMERON) tablet 30 mg  30 mg Oral QHS [name], [name], NP   30 mg at 02/06/20 2106  . nicotine (NICODERM CQ - dosed in mg/24 hours) patch 21 mg  21 mg Transdermal Daily [name], [name] T, MD   21 mg at 02/07/20 0900  . trihexyphenidyl (ARTANE) tablet 2 mg  2 mg Oral BID WC [name], [name], NP   2 mg at  02/07/20 0900   PTA Medications: Medications Prior to Admission  Medication Sig Dispense Refill Last Dose  . ARIPiprazole Lauroxil ER 1064 MG/3.9ML PRSY Inject into the muscle.     . benztropine (COGENTIN) 2 MG tablet Take 2 mg by mouth 2 (two) times daily.     . folic acid (FOLVITE) 1 MG tablet Take 1 mg by mouth daily.     Marland Kitchen LAMICTAL 100 MG tablet Take 100 mg by mouth daily.     . Melatonin 3-10 MG TABS Take 1 tablet by mouth at bedtime.     . mirtazapine (REMERON) 15 MG tablet Take 15 mg by mouth at bedtime.       Patient Stressors: Financial difficulties Medication change or noncompliance  Patient Strengths: Motivation for treatment/growth Religious Affiliation Supportive family/friends  Treatment Modalities: Medication Management, Group therapy, Case management,  1 to 1 session with clinician, Psychoeducation, Recreational therapy.   Physician Treatment Plan for Primary Diagnosis: Adjustment disorder with mixed disturbance of emotions and conduct Long Term Goal(s):     Short Term Goals:    Medication Management: Evaluate patient's response, side effects, and tolerance of medication regimen.  Therapeutic Interventions: 1 to 1 sessions, Unit Group sessions and Medication  administration.  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  Therapeutic Interventions: 1 to 1 sessions, Unit Group sessions and Medication administration.  Evaluation of Outcomes: Progressing   RN Treatment Plan for Primary Diagnosis: Adjustment disorder with mixed disturbance of emotions and  conduct Long Term Goal(s): Knowledge of disease and therapeutic regimen to maintain health will improve  Short Term Goals: Ability to demonstrate self-control, Ability to participate in decision making will improve, Ability to verbalize feelings will improve, Ability to identify and develop effective coping behaviors will improve and Compliance with prescribed medications will improve  Medication Management: RN will administer medications as ordered by provider, will assess and evaluate patient's response and provide education to patient for prescribed medication. RN will report any adverse and/or side effects to prescribing provider.  Therapeutic Interventions: 1 on 1 counseling sessions, Psychoeducation, Medication administration, Evaluate responses to treatment, Monitor vital signs and CBGs as ordered, Perform/monitor CIWA, COWS, AIMS and Fall Risk screenings as ordered, Perform wound care treatments as ordered.  Evaluation of Outcomes: Progressing   LCSW Treatment Plan for Primary Diagnosis: Adjustment disorder with mixed disturbance of emotions and conduct Long Term Goal(s): Safe transition to appropriate next level of care at discharge, Engage patient in therapeutic group addressing interpersonal concerns.  Short Term Goals: Engage patient in aftercare planning with referrals and resources, Increase social support, Increase ability to appropriately verbalize feelings, Increase emotional regulation and Increase skills for wellness and recovery  Therapeutic Interventions: Assess for all discharge needs, 1 to 1 time with Social worker, Explore available resources and support systems, Assess for adequacy in community support network, Educate family and significant other(s) on suicide prevention, Complete Psychosocial Assessment, Interpersonal group therapy.  Evaluation of Outcomes: Progressing   Progress in Treatment: Attending groups: No. Participating in groups: No. Taking medication as  prescribed: Yes. Toleration medication: Yes. Family/Significant other contact made: Yes, individual(s) contacted:  [name], Legal Guardian  Patient understands diagnosis: Yes. Discussing patient identified problems/goals with staff: Yes. Medical problems stabilized or resolved: Yes. Denies suicidal/homicidal ideation: Yes. Issues/concerns per patient self-inventory: No. Other: N/a  New problem(s) identified: No, Describe:  None  New Short Term/Long Term Goal(s): Elimination of symptoms of psychosis, medication management for mood stabilization; development of comprehensive mental wellness plan.  Update 01/17/2020:  No changes at this time.  Update 01/22/2020:  No changes at this time.  Update 01/28/2020:  No changes at this time. Update 02/02/2020: No changes Update 02/07/2020:  No changes at this time.   Patient Goals:  Patient stated that he would like to live somewhere else. Patient does not want to return to his group home. Update 01/17/2020:  No changes at this time. Update 01/22/2020:  No changes at this time.  Update 01/28/2020:  No changes at this time. Update 02/02/2020: No changes Update 02/07/2020:  No changes at this time.   Discharge Plan or Barriers: CSW will discuss appropriate plan for discharge.  Update 01/17/2020:  Patient is not able to return to his group home.  CSW will assist his guardian as she looks for placement for the patient. Update 01/22/2020:  Guardian reports that she has only  attempted to contact 5 homes since July when patient was made aware that he would have to leave the home.  CSW will to continue to assist the patient in identifying placement. Update 01/28/2020:  Patient remains on unit pending placement.  CSW will continue to search. Patient now has a Care Coordinator that is assisting. Per [facility] will not pay for additional resources and only one Group Home high within their catchment area and that has a long wait list.  Patient will remain  on the unit until placement is found. Update 02/02/2020: CSW continues to look for placement options.  Update 02/07/2020:  CSW team and CMA continue to look for placement.  Pt's care coordinator also continues to assist with placement.   Reason for Continuation of Hospitalization: Hallucinations Medication stabilization   Estimated Length of Stay:TBD  Attendees: Patient: 02/07/2020 11:42 AM  Physician: Dr. Domingo Cocking, MD 02/07/2020 11:42 AM  Nursing:  02/07/2020 11:42 AM  RN Care Manager: 02/07/2020 11:42 AM  Social Worker: Assunta Curtis, LCSW 02/07/2020 11:42 AM  Recreational Therapist:  02/07/2020 11:42 AM  Other: Gwynneth Munson" [place], [place] 02/07/2020 11:42 AM  Other:  02/07/2020 11:42 AM  Other: 02/07/2020 11:42 AM    Scribe for Treatment Team: Rozann Lesches, LCSW 02/07/2020 11:42 AM
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
## 2020-02-07 NOTE — Progress Notes (Signed)
Patient presents pleasant and cooperative. Denies any SI, HI, AVH. Isolative to self and room. Spent all of shift in room in no distress. Encouragement and support provided. Safety checks maintained. Medications given as prescribed. Pt receptive and remains safe on unit with q 15 min checks.

## 2020-02-07 NOTE — BHH Counselor (Signed)
CSW attempted to contact [facility], [phone].  CSW left HIPAA compliant voicemail.  Assunta Curtis, MSW, LCSW 02/07/2020 11:46 AM

## 2020-02-07 NOTE — Progress Notes (Signed)
Recreation Therapy Notes  Date: 02/07/2020  Time: 9:30 am  Location: Room 21    Behavioral response: Appropriate   Intervention Topic: Animal Assisted Therapy   Discussion/Intervention:  Animal Assisted Therapy took place today during group.  Animal Assisted Therapy is the planned inclusion of an animal in a patient's treatment plan. The patients were able to engage in therapy with an animal during group. Participants were educated on what a service dog is and the different between a support dog and a service dog. Patient were informed on the many animal needs there are and how their needs are similar. Individuals were enlightened on the process to get a service animal or support animal. Patients got the opportunity to pet the animal and were offered emotional support from the animal and staff.  Clinical Observations/Feedback:  Patient came to group and was on topic and was focused on what peers and staff had to say. Participant shared their experiences and history with animals. Individual was social with peers, staff and animal while participating in group.  Jeriko Kowalke LRT/CTRS         Yenty Bloch 02/07/2020 11:52 AM

## 2020-02-07 NOTE — Plan of Care (Signed)
Patient is pleasant and cooperative on approach.Denies SI,HI and AVH.Patient stayed in his room mostly.Attended some groups.Compliant with medications.Appetite and energy level good.Support and encouragement given.

## 2020-02-07 NOTE — Plan of Care (Signed)
  Problem: Safety: Goal: Periods of time without injury will increase Outcome: Progressing   Problem: Education: Goal: Ability to state activities that reduce stress will improve Outcome: Progressing

## 2020-02-08 MED ORDER — PROPRANOLOL HCL 10 MG PO TABS
10.0000 mg | ORAL_TABLET | Freq: Two times a day (BID) | ORAL | Status: DC
  Administered 2020-02-08 – 2020-02-11 (×6): 10 mg via ORAL
  Filled 2020-02-08 (×8): qty 1

## 2020-02-08 MED ORDER — AMLODIPINE BESYLATE 5 MG PO TABS
5.0000 mg | ORAL_TABLET | Freq: Every day | ORAL | Status: DC
  Administered 2020-02-09 – 2020-03-04 (×23): 5 mg via ORAL
  Filled 2020-02-08 (×24): qty 1

## 2020-02-08 NOTE — BHH Group Notes (Signed)
CSW contacted Claiborne Billings at [facility], [phone]. He still has not received the fax and asked that CSW again send it [phone].   CSW contacted Theadora Rama with [facility], [phone].  She reports that she has contacted [place] and [place].  She reports no leads.  She reports that she will reach out to [place] and [place] counties next.   CSW called Barnett Applebaum with [employer] [phone] to receive an update from the patient's ACTT team.  CSW left HIPAA compliant voicemail.   CSW attempted to contact the patient's guardian, [name], [place], [phone]. CSW left HIPAA compliant voicemail.  Assunta Curtis, MSW, LCSW 02/08/2020 3:08 PM

## 2020-02-08 NOTE — BHH Group Notes (Signed)
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
## 2020-02-08 NOTE — Progress Notes (Signed)
D: Pt alert and oriented. Pt denies experiencing any anxiety/depression at this time. Pt reports denies experiencing any pain at this time. Pt denies experiencing any SI/HI, or AVH at this time.   A: Scheduled medications administered to pt, per MD orders. Support and encouragement provided. Frequent verbal contact made. Routine safety checks conducted q15 minutes.   R: No adverse drug reactions noted. Pt verbally contracts for safety at this time. Pt complaint with medications. Pt interacts well with others on the unit, however mostly self isolates to his room. Pt remains safe at this time. Will continue to monitor.

## 2020-02-08 NOTE — Progress Notes (Signed)
Recreation Therapy Notes   Date: 02/08/2020  Time: 9:30 am   Location: Craft room     Behavioral response: N/A   Intervention Topic: Communication   Discussion/Intervention: Patient did not attend group.   Clinical Observations/Feedback:  Patient did not attend group.   Nycholas Rayner LRT/CTRS         Cameron Katayama 02/08/2020 11:34 AM
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
## 2020-02-09 NOTE — [facility] Group Notes (Signed)
[place] Group Notes:  (Nursing/MHT/Case Management/Adjunct)  Date:  02/09/2020  Time:  8:51 PM  Type of Therapy:  Group Therapy  Participation Level:  Did Not Attend  Ryan Zuniga 02/09/2020, 8:51 PM

## 2020-02-09 NOTE — [facility] Group Notes (Signed)
LCSW Group Therapy Note  02/09/2020  1:00 PM   Type of Therapy and Topic:  Group Therapy:  Trust and Honesty   Participation Level:  Did Not Attend   Description of Group:    In this group patients will be asked to explore the value of being honest.  Patients will be guided to discuss their thoughts, feelings, and behaviors related to honesty and trusting in others. Patients will process together how trust and honesty relate to forming relationships with peers, family members, and self. Each patient will be challenged to identify and express feelings of being vulnerable. Patients will discuss reasons why people are dishonest and identify alternative outcomes if one was truthful (to self or others). This group will be process-oriented, with patients participating in exploration of their own experiences, giving and receiving support, and processing challenge from other group members.   Therapeutic Goals: 1. Patient will identify why honesty is important to relationships and how honesty overall affects relationships.  2. Patient will identify a situation where they lied or were lied too and the  feelings, thought process, and behaviors surrounding the situation 3. Patient will identify the meaning of being vulnerable, how that feels, and how that correlates to being honest with self and others. 4. Patient will identify situations where they could have told the truth, but instead lied and explain reasons of dishonesty.   Summary of Patient Progress:  X    Therapeutic Modalities:   Cognitive Behavioral Therapy Solution Focused Therapy Motivational Interviewing Brief Therapy  Assunta Curtis, MSW, LCSW 02/09/2020 10:57 AM

## 2020-02-09 NOTE — Progress Notes (Signed)
Pt is cooperative with treatment. Pt denies experiencing any anxiety/depression at this time. Denies experiencing any SI/HI, or AVH at this time. Patient seemed to sleep well through out the night.

## 2020-02-09 NOTE — Progress Notes (Signed)
D- Patient alert and oriented. Pt is withdrawn and flat. Pt denies SI, HI, AVH, and pain. Pt did go outside today twice. Pt stayed in his room for last med pass and bp  because he said the voices were bothering him. He said that listening to music helps with his AH however he won't go watch tv because there are "to many people".   A- Scheduled medications administered to patient, per MD orders. Support and encouragement provided.  Routine safety checks conducted every 15 minutes.  Patient informed to notify staff with problems or concerns.  R- No adverse drug reactions noted. Patient contracts for safety at this time. Patient compliant with medications and treatment plan. Patient receptive, calm, and cooperative. Patient interacts well with others on the unit.  Patient remains safe at this time.  Collier Bullock RN

## 2020-02-09 NOTE — [facility] Group Notes (Signed)
Patient did NOT attend NA group.  Assunta Curtis, MSW, LCSW 02/09/2020 2:26 PM

## 2020-02-09 NOTE — Plan of Care (Signed)
Pt rates depression, anxiety, and hopelessness all at 8/10. Pt denies SI, HI and AVH. Pt was educated on care plan and verbalizes understanding. Collier Bullock RN Problem: Education: Goal: Freight forwarder Education information/materials will improve Outcome: Progressing Goal: Emotional status will improve Outcome: Progressing Goal: Mental status will improve Outcome: Progressing Goal: Verbalization of understanding the information provided will improve Outcome: Progressing   Problem: Safety: Goal: Periods of time without injury will increase Outcome: Progressing   Problem: Education: Goal: Knowledge of [place] [facility] Education information/materials will improve Outcome: Progressing Goal: Emotional status will improve Outcome: Progressing Goal: Mental status will improve Outcome: Progressing Goal: Verbalization of understanding the information provided will improve Outcome: Progressing   Problem: Activity: Goal: Interest or engagement in activities will improve Outcome: Not Progressing Goal: Sleeping patterns will improve Outcome: Progressing   Problem: Coping: Goal: Ability to verbalize frustrations and anger appropriately will improve Outcome: Progressing Goal: Ability to demonstrate self-control will improve Outcome: Progressing   Problem: Health Behavior/Discharge Planning: Goal: Identification of resources available to assist in meeting health care needs will improve Outcome: Progressing Goal: Compliance with treatment plan for underlying cause of condition will improve Outcome: Progressing   Problem: Physical Regulation: Goal: Ability to maintain clinical measurements within normal limits will improve Outcome: Progressing   Problem: Safety: Goal: Periods of time without injury will increase Outcome: Progressing   Problem: Education: Goal: Ability to state activities that reduce stress will improve Outcome: Progressing

## 2020-02-10 MED ORDER — OLANZAPINE 10 MG IM SOLR: 10.0000 mg | Freq: Three times a day (TID) | INTRAMUSCULAR | Status: DC | PRN

## 2020-02-10 MED ORDER — OLANZAPINE 5 MG PO TBDP: 10.0000 mg | ORAL_TABLET | Freq: Three times a day (TID) | ORAL | Status: DC | PRN
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
## 2020-02-10 NOTE — Plan of Care (Signed)
  Problem: Education: Goal: Knowledge of [facility] Education information/materials will improve Outcome: Progressing Goal: Emotional status will improve Outcome: Progressing Goal: Mental status will improve Outcome: Progressing Goal: Verbalization of understanding the information provided will improve Outcome: Progressing   

## 2020-02-10 NOTE — Progress Notes (Signed)
D: Pt alert and oriented. Pt denies experiencing any pain at this time. Pt denies experiencing any pain at this time. Pt denies experiencing any SI/HI, or AVH at this time.   Pt had and outburst episode this afternoon after having a conversation with his mother. Pt shared that his mother "pissed him off". Pt given prn medication was able to calm down and apologized to staff for his behavior. Pt has since been calm and cooperative.   Pt did go outside this morning when CSW was giving group.   A: Scheduled medications administered to pt, per MD orders. Support and encouragement provided. Frequent verbal contact made. Routine safety checks conducted q15 minutes.   R: No adverse drug reactions noted. Pt verbally contracts for safety at this time. Pt complaint with medications and treatment plan. Pt interacts well with others on the unit, however still mostly self isolates to his room. Pt remains safe at this time. Will continue to monitor.

## 2020-02-10 NOTE — Progress Notes (Signed)
Patient has been calm, cooperative and appropriate. Denies hearing voices. Denies SI, and Hi

## 2020-02-10 NOTE — [facility] Group Notes (Addendum)
   LCSW Group Therapy Note     02/10/2020 9:30 AM- 10:07 AM     Type of Therapy and Topic:  Group Therapy:  Overcoming Obstacles     Participation Level:  Active     Description of Group:     In this group patients will be encouraged to explore what they see as obstacles to their own wellness and recovery. They will be guided to discuss their thoughts, feelings, and behaviors related to these obstacles. The group will process together ways to cope with barriers, with attention given to specific choices patients can make. Each patient will be challenged to identify changes they are motivated to make in order to overcome their obstacles. This group will be process-oriented, with patients participating in exploration of their own experiences as well as giving and receiving support and challenge from other group members.     Therapeutic Goals:  1.    Patient will identify personal and current obstacles as they relate to admission.  2.    Patient will identify barriers that currently interfere with their wellness or overcoming obstacles.  3.    Patient will identify feelings, thought process and behaviors related to these barriers.  4.    Patient will identify two changes they are willing to make to overcome these obstacles:        Summary of Patient Progress: Patient checked into group feeling good. Patient was quiet during group and did not speak about his obstacles and the fears and barriers that might overcome him.          Therapeutic Modalities:    Cognitive Behavioral Therapy  Solution Focused Therapy  Motivational Interviewing  Relapse Prevention Therapy    Raina Mina, [place]   02/10/2020

## 2020-02-11 MED ORDER — PROPRANOLOL HCL 20 MG PO TABS
20.0000 mg | ORAL_TABLET | Freq: Two times a day (BID) | ORAL | Status: DC
  Administered 2020-02-11 – 2020-02-12 (×2): 20 mg via ORAL
  Filled 2020-02-11 (×2): qty 1

## 2020-02-11 NOTE — Plan of Care (Signed)
  Problem: Group Participation Goal: STG - Patient will engage in groups without prompting or encouragement from LRT x3 group sessions within 5 recreation therapy group sessions Description: STG - Patient will engage in groups without prompting or encouragement from LRT x3 group sessions within 5 recreation therapy group sessions Outcome: Not Progressing   

## 2020-02-11 NOTE — Progress Notes (Signed)
D- Patient alert and oriented. Affect/mood is isolative and sullen. Pt denies SI, HI, AVH, and pain. Pt was encouraged to attend groups but did not.   A- Scheduled medications administered to patient, per MD orders. Support and encouragement provided.  Routine safety checks conducted every 15 minutes.  Patient informed to notify staff with problems or concerns.  R- No adverse drug reactions noted. Patient contracts for safety at this time. Patient compliant with medications and treatment plan. Patient receptive, calm, and cooperative. Patient interacts well with others on the unit.  Patient remains safe at this time.  Collier Bullock RN

## 2020-02-11 NOTE — Progress Notes (Signed)
Recreation Therapy Notes   Date: 02/11/2020  Time: 9:30 am   Location: Craft room     Behavioral response: N/A   Intervention Topic: Relaxation   Discussion/Intervention: Patient did not attend group.   Clinical Observations/Feedback:  Patient did not attend group.   Kealii Thueson LRT/CTRS        Massai Hankerson 02/11/2020 11:19 AM

## 2020-02-11 NOTE — B[facility] Counselor (Signed)
CSW attempted to call Claiborne Billings at [facility] and left HIPAA compliant voicemail.   Assunta Curtis, MSW, LCSW 02/11/2020 3:51 PM

## 2020-02-11 NOTE — [facility] Counselor (Signed)
CSW contacted the following in an effort to find a possible group home placement.    [place], [phone] Message that call can not be completed as dialed.   [place], [phone] Possible bed available.  Asked to send information to [phone] Attention [place] [facility].   [place], [phone] Line rang then message cam on to enter remote access code.  [facility], [place] [phone] No male beds available.    [facility], [phone] CSW left HIPAA compliant voicemail.   [facility], [phone]  No male beds available.    [facility], [place], [phone] No male beds available.   [facility], [phone]  No male beds available.   [facility], [phone] One male bed available.  However, declined patient due to elopement history.    [place], [phone]  No male beds available.   [facility], [phone] No male beds available.   [facility], [phone] Asked to call back within 30 minutes to speak with the manager.   [facility], [phone] CSW left HIPAA compliant voicemail.   [facility], [phone] No male beds available.    [name], [phone] Pt not appropriate for this home per the manager.   [facility] [address] [phone] Received message that call could not be completed at this time.   [facility], [phone] CSW left HIPAA compliant voicemail.  [address], [place], [phone] CSW left HIPAA compliant voicemail.    Assunta Curtis, MSW, LCSW 02/11/2020 3:16 PM

## 2020-02-11 NOTE — B[facility] Counselor (Signed)
CSW spoke with the patient's guardian who reports that she has possibly found placement for patient at [facility].  She provided the contact information for CSW to reach out.  CSW called Scot Jun at [phone].  He asked if he could visit patient on unit and CSW informed that she would have to staff it and call back. He requested a weeks worth of nurses notes, current medication list, FL2 and H&P, he requested information be sent to [phone].  CSW did speak with Garald Balding Director who reports that Mr. Aldean Ast CAN come to the unit.   CSW attempted to call Mr. Aldean Ast back but went to voicemail.  Voicemail was full and CSW was unable to leave a message.  CSW will attempt again.  CSW received confirmation the fax was successful.  Assunta Curtis, MSW, LCSW 02/11/2020 4:26 PM
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
## 2020-02-11 NOTE — Plan of Care (Signed)
  Problem: Education: Goal: Knowledge of [facility] Education information/materials will improve Outcome: Progressing Goal: Emotional status will improve Outcome: Progressing Goal: Mental status will improve Outcome: Progressing Goal: Verbalization of understanding the information provided will improve Outcome: Progressing   Problem: Safety: Goal: Periods of time without injury will increase Outcome: Progressing   Problem: Education: Goal: Ability to state activities that reduce stress will improve Outcome: Progressing   Problem: Education: Goal: Knowledge of [facility] Education information/materials will improve Outcome: Progressing Goal: Emotional status will improve Outcome: Progressing Goal: Mental status will improve Outcome: Progressing Goal: Verbalization of understanding the information provided will improve Outcome: Progressing   Problem: Activity: Goal: Interest or engagement in activities will improve Outcome: Progressing Goal: Sleeping patterns will improve Outcome: Progressing   Problem: Coping: Goal: Ability to verbalize frustrations and anger appropriately will improve Outcome: Progressing Goal: Ability to demonstrate self-control will improve Outcome: Progressing   Problem: Health Behavior/Discharge Planning: Goal: Identification of resources available to assist in meeting health care needs will improve Outcome: Progressing Goal: Compliance with treatment plan for underlying cause of condition will improve Outcome: Progressing   Problem: Physical Regulation: Goal: Ability to maintain clinical measurements within normal limits will improve Outcome: Progressing   Problem: Safety: Goal: Periods of time without injury will increase Outcome: Progressing

## 2020-02-11 NOTE — [facility] Group Notes (Signed)
LCSW Group Therapy Note   02/11/2020 2:17 PM  Type of Therapy and Topic:  Group Therapy:  Overcoming Obstacles   Participation Level:  Did Not Attend   Description of Group:    In this group patients will be encouraged to explore what they see as obstacles to their own wellness and recovery. They will be guided to discuss their thoughts, feelings, and behaviors related to these obstacles. The group will process together ways to cope with barriers, with attention given to specific choices patients can make. Each patient will be challenged to identify changes they are motivated to make in order to overcome their obstacles. This group will be process-oriented, with patients participating in exploration of their own experiences as well as giving and receiving support and challenge from other group members.   Therapeutic Goals: 1. Patient will identify personal and current obstacles as they relate to admission. 2. Patient will identify barriers that currently interfere with their wellness or overcoming obstacles.  3. Patient will identify feelings, thought process and behaviors related to these barriers. 4. Patient will identify two changes they are willing to make to overcome these obstacles:      Summary of Patient Progress X     Therapeutic Modalities:   Cognitive Behavioral Therapy Solution Focused Therapy Motivational Interviewing Relapse Prevention Therapy  Hedy Camara R. Guerry Bruin, MSW, [place], [place] 02/11/2020 2:17 PM

## 2020-02-11 NOTE — Progress Notes (Signed)
Patient has been pleasant and cooperative. Washed his clothes. Denies SI, HI and AVH

## 2020-02-11 NOTE — Plan of Care (Signed)
Pt rates anxiety, depression and hopelessness all at 3/10. Pt denies SI, HI and AVH. Pt was educated on care plan and verbalizes understanding. Collier Bullock RN Problem: Education: Goal: Freight forwarder Education information/materials will improve Outcome: Progressing Goal: Emotional status will improve Outcome: Progressing Goal: Mental status will improve Outcome: Progressing Goal: Verbalization of understanding the information provided will improve Outcome: Progressing   Problem: Safety: Goal: Periods of time without injury will increase Outcome: Progressing   Problem: Education: Goal: Knowledge of [facility] Education information/materials will improve Outcome: Progressing Goal: Emotional status will improve Outcome: Progressing Goal: Mental status will improve Outcome: Progressing Goal: Verbalization of understanding the information provided will improve Outcome: Progressing   Problem: Activity: Goal: Interest or engagement in activities will improve Outcome: Progressing Goal: Sleeping patterns will improve Outcome: Progressing   Problem: Coping: Goal: Ability to verbalize frustrations and anger appropriately will improve Outcome: Progressing Goal: Ability to demonstrate self-control will improve Outcome: Progressing   Problem: Health Behavior/Discharge Planning: Goal: Identification of resources available to assist in meeting health care needs will improve Outcome: Progressing Goal: Compliance with treatment plan for underlying cause of condition will improve Outcome: Progressing   Problem: Physical Regulation: Goal: Ability to maintain clinical measurements within normal limits will improve Outcome: Progressing   Problem: Safety: Goal: Periods of time without injury will increase Outcome: Progressing   Problem: Education: Goal: Ability to state activities that reduce stress will improve Outcome: Progressing

## 2020-02-12 MED ORDER — PROPRANOLOL HCL 20 MG PO TABS
40.0000 mg | ORAL_TABLET | Freq: Two times a day (BID) | ORAL | Status: DC
  Administered 2020-02-12 – 2020-02-15 (×6): 40 mg via ORAL
  Filled 2020-02-12 (×6): qty 2

## 2020-02-12 NOTE — BHH Group Notes (Signed)
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
## 2020-02-12 NOTE — Progress Notes (Signed)
Patient continues to be isolative to his room.  He will come out for snack and meals but continues to refuse group. He is calm cooperative and will engage when approached. He denies SI  HI  AVH depression anxiety and pain at this encounter.  He is med compliant and tolerated his medications without incident. He is safe with 15 minute safety checks and encouraged to contact staff with any concerns.      Cleo Butler-Nicholson, LPN

## 2020-02-12 NOTE — Progress Notes (Signed)
Patient calm and appropriate on the unit. Patient isolative to his room but did come out for snack and medications. Patient compliant with medication administration per MD orders this evening. Patient denies SI/HI/AVH. Pt presents with intellectual disability. Patient given support, encouragement and education. Patient being monitored Q 15 minutes for safety per unit protocol. Pt remains safe on the unit

## 2020-02-12 NOTE — Plan of Care (Signed)
Pt rates depression and anxiety 3/10. Pt denies SI, HI and AVH. Pt was educated on care plan and verbalizes understanding. Pt was encouraged to attend groups and take a shower. Collier Bullock RN Problem: Education: Goal: Freight forwarder Education information/materials will improve Outcome: Progressing Goal: Emotional status will improve Outcome: Progressing Goal: Mental status will improve Outcome: Progressing Goal: Verbalization of understanding the information provided will improve Outcome: Progressing   Problem: Safety: Goal: Periods of time without injury will increase Outcome: Progressing   Problem: Education: Goal: Knowledge of [place] [facility] Education information/materials will improve Outcome: Progressing Goal: Emotional status will improve Outcome: Progressing Goal: Mental status will improve Outcome: Progressing Goal: Verbalization of understanding the information provided will improve Outcome: Progressing   Problem: Activity: Goal: Interest or engagement in activities will improve Outcome: Not Progressing Goal: Sleeping patterns will improve Outcome: Progressing   Problem: Coping: Goal: Ability to verbalize frustrations and anger appropriately will improve Outcome: Progressing Goal: Ability to demonstrate self-control will improve Outcome: Progressing   Problem: Health Behavior/Discharge Planning: Goal: Identification of resources available to assist in meeting health care needs will improve Outcome: Progressing Goal: Compliance with treatment plan for underlying cause of condition will improve Outcome: Progressing   Problem: Physical Regulation: Goal: Ability to maintain clinical measurements within normal limits will improve Outcome: Progressing   Problem: Safety: Goal: Periods of time without injury will increase Outcome: Progressing   Problem: Education: Goal: Ability to state activities that reduce stress will improve Outcome:  Not Progressing

## 2020-02-12 NOTE — Plan of Care (Signed)
  Problem: Education: Goal: Knowledge of [place] [facility] Education information/materials will improve Outcome: Progressing Goal: Emotional status will improve Outcome: Progressing Goal: Mental status will improve Outcome: Progressing Goal: Verbalization of understanding the information provided will improve Outcome: Progressing   Problem: Safety: Goal: Periods of time without injury will increase Outcome: Progressing   Problem: Education: Goal: Ability to state activities that reduce stress will improve Outcome: Progressing   Problem: Education: Goal: Knowledge of [name] [facility] Education information/materials will improve Outcome: Progressing Goal: Emotional status will improve Outcome: Progressing Goal: Mental status will improve Outcome: Progressing Goal: Verbalization of understanding the information provided will improve Outcome: Progressing   Problem: Activity: Goal: Interest or engagement in activities will improve Outcome: Progressing Goal: Sleeping patterns will improve Outcome: Progressing   Problem: Coping: Goal: Ability to verbalize frustrations and anger appropriately will improve Outcome: Progressing Goal: Ability to demonstrate self-control will improve Outcome: Progressing   Problem: Health Behavior/Discharge Planning: Goal: Identification of resources available to assist in meeting health care needs will improve Outcome: Progressing Goal: Compliance with treatment plan for underlying cause of condition will improve Outcome: Progressing   Problem: Physical Regulation: Goal: Ability to maintain clinical measurements within normal limits will improve Outcome: Progressing   Problem: Safety: Goal: Periods of time without injury will increase Outcome: Progressing

## 2020-02-12 NOTE — Progress Notes (Signed)
Recreation Therapy Notes  Date: 02/12/2020  Time: 9:30 am   Location: Craft room     Behavioral response: N/A   Intervention Topic: Stress Management   Discussion/Intervention: Patient did not attend group.   Clinical Observations/Feedback:  Patient did not attend group.   Ashayla Subia LRT/CTRS        Floriene Jeschke 02/12/2020 11:17 AM

## 2020-02-12 NOTE — Plan of Care (Signed)
Patient presents at his baseline  Problem: Education: Goal: Emotional status will improve Outcome: Not Progressing Goal: Mental status will improve Outcome: Not Progressing   

## 2020-02-12 NOTE — Tx Team (Signed)
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 acetaminophen (TYLENOL) tablet 650 mg  650 mg Oral Q6H PRN [name], [name], NP      . alum & mag hydroxide-simeth (MAALOX/MYLANTA) 200-200-20 MG/5ML suspension 30 mL  30 mL Oral Q4H PRN [name], [name], NP      . amLODipine (NORVASC) tablet 5 mg  5 mg Oral Daily Salley Scarlet, MD   5 mg at 02/12/20 0758  . ARIPiprazole (ABILIFY) tablet 5 mg  5 mg Oral QHS [name], [name], NP   5 mg at 02/11/20 2132  . doxepin (SINEQUAN) capsule 20 mg  20 mg Oral QHS [name], [name], NP   20 mg at 02/11/20 2132  . folic acid (FOLVITE) tablet 1 mg  1 mg Oral Daily [name], [name], NP   1 mg at 02/12/20 0759  . hydrOXYzine (ATARAX/VISTARIL) tablet 50 mg  50 mg Oral Q6H PRN [name], [name], MD   50 mg at 02/10/20 1150  . lamoTRIgine (LAMICTAL) tablet 125 mg  125 mg Oral Daily Anette Riedel M, NP   125 mg at 02/12/20 0759  . lithium carbonate capsule 150 mg  150 mg Oral BID WC [name], [name], NP   150 mg at 02/12/20 0759  . magnesium hydroxide (MILK OF MAGNESIA) suspension 30 mL  30 mL Oral Daily PRN [name], [name], NP      . melatonin tablet 2.5 mg  2.5 mg Oral QHS [name], [name], NP   2.5 mg at 02/11/20 2132  . mirtazapine (REMERON) tablet 30 mg  30 mg Oral QHS [name], [name], NP   30 mg at 02/11/20 2133  . nicotine (NICODERM CQ - dosed in mg/24 hours) patch 21 mg  21 mg Transdermal Daily [name], [name], MD   21 mg at  02/12/20 0759  . OLANZapine zydis (ZYPREXA) disintegrating tablet 10 mg  10 mg Oral TID PRN Rulon Sera, MD       Or  . OLANZapine (ZYPREXA) injection 10 mg  10 mg Intramuscular TID PRN Rulon Sera, MD      . propranolol (INDERAL) tablet 40 mg  40 mg Oral BID Selina Cooley M, MD      . trihexyphenidyl (ARTANE) tablet 2 mg  2 mg Oral BID WC [name], [name], NP   2 mg at 02/12/20 0757   PTA Medications: Medications Prior to Admission  Medication Sig Dispense Refill Last Dose  . ARIPiprazole Lauroxil ER 1064 MG/3.9ML PRSY Inject into the muscle.     . benztropine (COGENTIN) 2 MG tablet Take 2 mg by mouth 2 (two) times daily.     . folic acid (FOLVITE) 1 MG tablet Take 1 mg by mouth daily.     Marland Kitchen LAMICTAL 100 MG tablet Take 100 mg by mouth daily.     . Melatonin 3-10 MG TABS Take 1 tablet by mouth at bedtime.     . mirtazapine (REMERON) 15 MG tablet Take 15 mg by mouth at bedtime.       Patient Stressors: Museum/gallery curator  difficulties Medication change or noncompliance  Patient Strengths: Motivation for treatment/growth Religious Affiliation Supportive family/friends  Treatment Modalities: Medication Management, Group therapy, Case management,  1 to 1 session with clinician, Psychoeducation, Recreational therapy.   Physician Treatment Plan for Primary Diagnosis: Adjustment disorder with mixed disturbance of emotions and conduct Long Term Goal(s):     Short Term Goals:    Medication Management: Evaluate patient's response, side effects, and tolerance of medication regimen.  Therapeutic Interventions: 1 to 1 sessions, Unit Group sessions and Medication administration.  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  Therapeutic Interventions: 1 to 1 sessions, Unit Group sessions and Medication administration.  Evaluation of Outcomes: Progressing   RN Treatment Plan for Primary Diagnosis: Adjustment disorder with mixed disturbance of emotions and conduct Long Term Goal(s): Knowledge of disease and therapeutic regimen to maintain health will improve  Short Term Goals: Ability to demonstrate self-control, Ability to participate in decision making will improve, Ability to verbalize feelings will improve, Ability to identify and develop effective coping behaviors will improve and Compliance with prescribed medications will improve  Medication Management: RN will administer medications as ordered by provider, will assess and evaluate patient's response and provide education to patient for prescribed medication. RN will report any adverse and/or side effects to prescribing provider.  Therapeutic Interventions: 1 on 1 counseling sessions, Psychoeducation, Medication administration, Evaluate responses to treatment, Monitor vital signs and CBGs as ordered, Perform/monitor CIWA, COWS, AIMS and Fall Risk screenings as ordered, Perform wound care treatments as ordered.  Evaluation of Outcomes: Progressing   LCSW Treatment Plan for Primary Diagnosis: Adjustment disorder with mixed disturbance of emotions and conduct Long Term Goal(s): Safe transition to appropriate next level of care at discharge, Engage patient in therapeutic group addressing interpersonal concerns.  Short Term Goals: Engage patient in aftercare planning with referrals and resources, Increase social support, Increase ability to appropriately verbalize feelings, Increase emotional regulation and Increase skills for wellness and recovery  Therapeutic Interventions: Assess for all discharge needs, 1 to 1 time with Social  worker, Explore available resources and support systems, Assess for adequacy in community support network, Educate family and significant other(s) on suicide prevention, Complete Psychosocial Assessment, Interpersonal group therapy.  Evaluation of Outcomes: Progressing   Progress in Treatment: Attending groups: No. Participating in groups: No. Taking medication as prescribed: Yes. Toleration medication: Yes. Family/Significant other contact made: Yes, individual(s) contacted:  [name], Legal Guardian  Patient understands diagnosis: Yes. Discussing patient identified problems/goals with staff: Yes. Medical problems stabilized or resolved: Yes. Denies suicidal/homicidal ideation: Yes. Issues/concerns per patient self-inventory: No. Other: N/a  New problem(s) identified: No, Describe:  None  New Short Term/Long Term Goal(s): Elimination of symptoms of psychosis, medication management for mood stabilization; development of comprehensive mental wellness plan.  Update 01/17/2020:  No changes at this time.  Update 01/22/2020:  No changes at this time.  Update 01/28/2020:  No changes at this time. Update 02/02/2020: No changes Update 02/07/2020:  No changes at this time. Update 02/12/2020: No changes at this time.  Patient Goals:  Patient stated that he would like to live somewhere else. Patient does not  want to return to his group home. Update 01/17/2020:  No changes at this time. Update 01/22/2020:  No changes at this time.  Update 01/28/2020:  No changes at this time. Update 02/02/2020: No changes Update 02/07/2020:  No changes at this time. Update 02/12/20: No changes at this time.   Discharge Plan or Barriers: CSW will discuss appropriate plan for discharge.  Update 01/17/2020:  Patient is not able to return to his group home.  CSW will assist his guardian as she looks for placement for the patient. Update 01/22/2020:  Guardian reports that she has only attempted to contact 5 homes since July  when patient was made aware that he would have to leave the home.  CSW will to continue to assist the patient in identifying placement. Update 01/28/2020:  Patient remains on unit pending placement.  CSW will continue to search. Patient now has a Care Coordinator that is assisting. Per [facility] will not pay for additional resources and only one Group Home high within their catchment area and that has a long wait list.  Patient will remain on the unit until placement is found. Update 02/02/2020: CSW continues to look for placement options.  Update 02/07/2020:  CSW team and CMA continue to look for placement.  Pt's care coordinator also continues to assist with placement. Update 02/12/2020: No changes at this time. CSW team and CMA continue to look for placement.   Reason for Continuation of Hospitalization: Hallucinations Medication stabilization   Estimated Length of Stay:TBD  Attendees: Patient: 02/12/2020 10:57 AM  Physician: Dr. Domingo Cocking, MD 02/12/2020 10:57 AM  Nursing:  02/12/2020 10:57 AM  RN Care Manager: 02/12/2020 10:57 AM  Social Worker: Assunta Curtis, LCSW 02/12/2020 10:57 AM  Recreational Therapist:  02/12/2020 10:57 AM  Other: Gwynneth Munson" [place], [place] 02/12/2020 10:57 AM  Other:  02/12/2020 10:57 AM  Other: 02/12/2020 10:57 AM    Scribe for Treatment Team: Shirl [place], LCSW 02/12/2020 10:57 AM

## 2020-02-13 NOTE — Progress Notes (Signed)
Patient calm and appropriate on the unit. Patient isolative to his room but did come out for snack and medications. Patient compliant with medication administration per MD orders this evening. Patient denies SI/HI/AVH. Pt presents with intellectual disability. Patient given support, encouragement and education. Patient being monitored Q 15 minutes for safety per unit protocol. Pt remains safe on the unit

## 2020-02-13 NOTE — BHH Counselor (Signed)
CSW attempted to assist the patient in his interview with his group home.  Patient became agitated after being asked "Where are you from?" and left the meeting. Paitent was cursing, yelling and appeared very angry.    CSW met with the patient in an effort to deescalate and identify the trigger, however the patient was pacing and remained agitated.  He stated several times that he wanted to go to a homeless shelter and go to [employer].  CSW was unable to get patient to deescalate.  However, the patient's nurse was able to get the patient to deescalate, however, the group home owner declined and did not wish to continue this patient's interview.   Assunta Curtis, MSW, LCSW 02/13/2020 4:35 PM
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
## 2020-02-13 NOTE — Progress Notes (Signed)
D- Patient alert and oriented. Affect/mood is flat and mostly  withdrawn. Pt denies SI, HI, AVH, and pain. Pt did attend a group today and participated. Pt opened his blinds up for awhile. He enjoyed the nursing students very much. He finally attended the group home meeting after walking out the first time.h stated that he wanted to go to a group home in [facility] where his family is.   A- Scheduled medications administered to patient, per MD orders. Support and encouragement provided.  Routine safety checks conducted every 15 minutes.  Patient informed to notify staff with problems or concerns.  R- No adverse drug reactions noted. Patient contracts for safety at this time. Patient compliant with medications and treatment plan. Patient receptive, calm, and cooperative. Patient interacts well with others on the unit.  Patient remains safe at this time.  Collier Bullock RN

## 2020-02-13 NOTE — Progress Notes (Signed)
Recreation Therapy Notes   Date: 02/13/2020  Time: 9:30 am   Location: Craft room   Behavioral response: Appropriate  Intervention Topic: Happiness   Discussion/Intervention:  Group content today was focused on Happiness. The group defined happiness and described where happiness comes from. Individuals identified what makes them happy and how they go about making others happy. Patients expressed things that stop them from being happy and ways they can improve their happiness. The group stated reasons why it is important to be happy. The group participated in the intervention "My Happiness", where they had a chance to identify and express things that make them happy.  Clinical Observations/Feedback: Patient came to group and defined happiness as listening to your heart. He stated that happiness is love.  Individual was social with peers and staff while participating in the intervention.  Michio Thier LRT/CTRS          Nikeia Henkes 02/13/2020 1:36 PM

## 2020-02-13 NOTE — Plan of Care (Signed)
Pt rates depression and anxiety 4/10. Pt denies SI, HI and AVH. Pt was educated on care plan and verbalizes understanding. Pt was encouraged to take a shower, wash his clothes and attend groups. Collier Bullock RN Problem: Education: Goal: Freight forwarder Education information/materials will improve Outcome: Progressing Goal: Emotional status will improve Outcome: Progressing Goal: Mental status will improve Outcome: Progressing Goal: Verbalization of understanding the information provided will improve Outcome: Progressing   Problem: Safety: Goal: Periods of time without injury will increase Outcome: Progressing   Problem: Education: Goal: Knowledge of [place] [facility] Education information/materials will improve Outcome: Progressing Goal: Emotional status will improve Outcome: Progressing Goal: Mental status will improve Outcome: Progressing Goal: Verbalization of understanding the information provided will improve Outcome: Progressing   Problem: Activity: Goal: Interest or engagement in activities will improve Outcome: Not Progressing Goal: Sleeping patterns will improve Outcome: Progressing   Problem: Coping: Goal: Ability to verbalize frustrations and anger appropriately will improve Outcome: Progressing Goal: Ability to demonstrate self-control will improve Outcome: Progressing   Problem: Health Behavior/Discharge Planning: Goal: Identification of resources available to assist in meeting health care needs will improve Outcome: Progressing Goal: Compliance with treatment plan for underlying cause of condition will improve Outcome: Progressing   Problem: Physical Regulation: Goal: Ability to maintain clinical measurements within normal limits will improve Outcome: Progressing   Problem: Safety: Goal: Periods of time without injury will increase Outcome: Progressing   Problem: Education: Goal: Ability to state activities that reduce stress will  improve Outcome: Progressing

## 2020-02-13 NOTE — BHH Group Notes (Signed)
LCSW Group Therapy Note  02/13/2020 4:21 PM  Type of Therapy/Topic:  Group Therapy:  Emotion Regulation  Participation Level:  Did Not Attend   Description of Group:   The purpose of this group is to assist patients in learning to regulate negative emotions and experience positive emotions. Patients will be guided to discuss ways in which they have been vulnerable to their negative emotions. These vulnerabilities will be juxtaposed with experiences of positive emotions or situations, and patients will be challenged to use positive emotions to combat negative ones. Special emphasis will be placed on coping with negative emotions in conflict situations, and patients will process healthy conflict resolution skills.  Therapeutic Goals: 1. Patient will identify two positive emotions or experiences to reflect on in order to balance out negative emotions 2. Patient will label two or more emotions that they find the most difficult to experience 3. Patient will demonstrate positive conflict resolution skills through discussion and/or role plays  Summary of Patient Progress: X  Therapeutic Modalities:   Cognitive Behavioral Therapy Feelings Identification Dialectical Behavioral Therapy  Charday Capetillo R. Guerry Bruin, MSW, LCSW, [place] 02/13/2020 4:21 PM

## 2020-02-13 NOTE — BHH Counselor (Signed)
CSW called Scot Jun at [phone].  CSW was unable to speak with him or leave a HIPAA compliant message as the voicemail box is full. He is scheduled to talk to the patient regarding possible placement.  Assunta Curtis, MSW, LCSW 02/13/2020 2:46 PM

## 2020-02-13 NOTE — Plan of Care (Signed)
Patient presents at his baseline  Problem: Education: Goal: Emotional status will improve Outcome: Not Progressing Goal: Mental status will improve Outcome: Not Progressing   

## 2020-02-14 NOTE — Progress Notes (Signed)
Patient calm and appropriate on the unit. Patient isolative to his room but did come out for snack and medications. Patient compliant with medication administration per MD orders this evening. Patient denies SI/HI/AVH. Pt presents with intellectual disability. Patient given support, encouragement and education. Patient being monitored Q 15 minutes for safety per unit protocol. Pt remains safe on the unit

## 2020-02-14 NOTE — Plan of Care (Signed)
D- Patient alert and oriented. Patient presented in a pleasant mood on assessment stating that slept good last night and had no complaints to voice to this Probation officer. Patient denied anxiety, however, he reported depression, stating that "I want to be released". Patient also denied SI, HI, AVH, and pain at this time. Patient had no stated goals for today.  A- Scheduled medications administered to patient, per MD orders. Support and encouragement provided.  Routine safety checks conducted every 15 minutes.  Patient informed to notify staff with problems or concerns.  R- No adverse drug reactions noted. Patient contracts for safety at this time. Patient compliant with medications. Patient receptive, calm, and cooperative. Patient interacts well with others on the unit.  Patient remains safe at this time.  Problem: Education: Goal: Knowledge of [facility] Education information/materials will improve Outcome: Progressing Goal: Emotional status will improve Outcome: Progressing Goal: Mental status will improve Outcome: Progressing Goal: Verbalization of understanding the information provided will improve Outcome: Progressing   Problem: Safety: Goal: Periods of time without injury will increase Outcome: Progressing   Problem: Education: Goal: Ability to state activities that reduce stress will improve Outcome: Progressing   Problem: Education: Goal: Knowledge of [place] Education information/materials will improve Outcome: Progressing Goal: Emotional status will improve Outcome: Progressing Goal: Mental status will improve Outcome: Progressing Goal: Verbalization of understanding the information provided will improve Outcome: Progressing   Problem: Activity: Goal: Interest or engagement in activities will improve Outcome: Progressing Goal: Sleeping patterns will improve Outcome: Progressing   Problem: Coping: Goal: Ability to verbalize frustrations and anger  appropriately will improve Outcome: Progressing Goal: Ability to demonstrate self-control will improve Outcome: Progressing   Problem: Health Behavior/Discharge Planning: Goal: Identification of resources available to assist in meeting health care needs will improve Outcome: Progressing Goal: Compliance with treatment plan for underlying cause of condition will improve Outcome: Progressing   Problem: Physical Regulation: Goal: Ability to maintain clinical measurements within normal limits will improve Outcome: Progressing   Problem: Safety: Goal: Periods of time without injury will increase Outcome: Progressing

## 2020-02-14 NOTE — Progress Notes (Signed)
Recreation Therapy Notes   Date: 02/14/2020  Time: 9:30 am   Location: Craft room     Behavioral response: N/A   Intervention Topic: Communication    Discussion/Intervention: Patient did not attend group.   Clinical Observations/Feedback:  Patient did not attend group.   Esma Kilts LRT/CTRS        Aaren Krog 02/14/2020 11:28 AM
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
## 2020-02-14 NOTE — BHH Group Notes (Signed)
LCSW Group Therapy Note  02/14/2020 2:47 PM  Type of Therapy/Topic:  Group Therapy:  Balance in Life  Participation Level:  Did Not Attend  Description of Group:    This group will address the concept of balance and how it feels and looks when one is unbalanced. Patients will be encouraged to process areas in their lives that are out of balance and identify reasons for remaining unbalanced. Facilitators will guide patients in utilizing problem-solving interventions to address and correct the stressor making their life unbalanced. Understanding and applying boundaries will be explored and addressed for obtaining and maintaining a balanced life. Patients will be encouraged to explore ways to assertively make their unbalanced needs known to significant others in their lives, using other group members and facilitator for support and feedback.  Therapeutic Goals: 1. Patient will identify two or more emotions or situations they have that consume much of in their lives. 2. Patient will identify signs/triggers that life has become out of balance:  3. Patient will identify two ways to set boundaries in order to achieve balance in their lives:  4. Patient will demonstrate ability to communicate their needs through discussion and/or role plays  Summary of Patient Progress: X  Therapeutic Modalities:   Cognitive Behavioral Therapy Solution-Focused Therapy Assertiveness Training  Assunta Curtis MSW, LCSW 02/14/2020 2:47 PM

## 2020-02-14 NOTE — BHH Counselor (Addendum)
CSW spoke with the patient's guardian, [name], [phone].    CSW informed guardian of the patient's behaviors during the meeting.  CSW informed that at this time the [facility] has chosen to pass on the patient.   Guardian reports she has NOT contacted any other homes/counties because "I was sure that [facility] would take" the patient.  She reports that she will "go further east than [place] and [employer]  She also reports that she will reach out to a "few more places in [place]".   Assunta Curtis, MSW, LCSW 02/14/2020 2:52 PM

## 2020-02-14 NOTE — Plan of Care (Signed)
Patient presents at his baseline  Problem: Education: Goal: Emotional status will improve Outcome: Not Progressing Goal: Mental status will improve Outcome: Not Progressing   

## 2020-02-15 MED ORDER — PROPRANOLOL HCL 20 MG PO TABS
60.0000 mg | ORAL_TABLET | Freq: Two times a day (BID) | ORAL | Status: DC
  Administered 2020-02-15 – 2020-03-04 (×29): 60 mg via ORAL
  Filled 2020-02-15 (×37): qty 3
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
## 2020-02-15 NOTE — BHH Counselor (Signed)
CSW attempted to call Claiborne Billings at [facility], [phone] and left HIPAA compliant voicemail.   Assunta Curtis, MSW, LCSW 02/15/2020 11:16 AM

## 2020-02-15 NOTE — Progress Notes (Signed)
Recreation Therapy Notes   Date: 02/15/2020  Time: 9:30 am   Location: Craft room     Behavioral response: N/A   Intervention Topic: Self-care   Discussion/Intervention: Patient did not attend group.   Clinical Observations/Feedback:  Patient did not attend group.   Shar Paez LRT/CTRS        Kendry Pfarr 02/15/2020 1:06 PM

## 2020-02-15 NOTE — BHH Counselor (Signed)
CSW contacted Theadora Rama with [facility], [phone].    CSW updated Katharine Look on the patient's interview with Scot Jun.  CSW updated on CSW contacts this week in an effort to identify placement for the patient.   She reports that the guardian informed her that she has contacted [facility] and [place].  She reports that she has contacted Delanna Notice, [place], [place] and [place]. She reports that she will look into [place], [place] and [place] counties.    [place] referral was discussed.    Assunta Curtis, MSW, LCSW 02/15/2020 4:22 PM
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
## 2020-02-15 NOTE — [facility] Group Notes (Signed)
LCSW Group Therapy Note  02/15/2020 3:41 PM  Type of Therapy and Topic:  Group Therapy:  Feelings around Relapse and Recovery  Participation Level:  Did Not Attend   Description of Group:    Patients in this group will discuss emotions they experience before and after a relapse. They will process how experiencing these feelings, or avoidance of experiencing them, relates to having a relapse. Facilitator will guide patients to explore emotions they have related to recovery. Patients will be encouraged to process which emotions are more powerful. They will be guided to discuss the emotional reaction significant others in their lives may have to their relapse or recovery. Patients will be assisted in exploring ways to respond to the emotions of others without this contributing to a relapse.  Therapeutic Goals: 1. Patient will identify two or more emotions that lead to a relapse for them 2. Patient will identify two emotions that result when they relapse 3. Patient will identify two emotions related to recovery 4. Patient will demonstrate ability to communicate their needs through discussion and/or role plays   Summary of Patient Progress: X   Therapeutic Modalities:   Cognitive Behavioral Therapy Solution-Focused Therapy Assertiveness Training Relapse Prevention Therapy   Hedy Camara R. Guerry Bruin, MSW, [place], [place] 02/15/2020 3:41 PM

## 2020-02-15 NOTE — Plan of Care (Signed)
D- Patient alert and oriented. Patient presented in a pleasant mood on assessment stating that he slept good last night and had no complaints to voice to this Probation officer. Patient denied SI, HI, AVH, and pain at this time. Patient also denied any signs/symptoms of depression/anxiety, reporting that he is feeling "ok" overall. Patient had no stated goals for today.  A- Scheduled medications administered to patient, per MD orders. Support and encouragement provided.  Routine safety checks conducted every 15 minutes.  Patient informed to notify staff with problems or concerns.  R- No adverse drug reactions noted. Patient contracts for safety at this time. Patient compliant with medications. Patient receptive, calm, and cooperative. Patient interacts well with others on the unit.  Patient remains safe at this time.  Problem: Education: Goal: Knowledge of [place] [facility] Education information/materials will improve Outcome: Progressing Goal: Emotional status will improve Outcome: Progressing Goal: Mental status will improve Outcome: Progressing Goal: Verbalization of understanding the information provided will improve Outcome: Progressing   Problem: Safety: Goal: Periods of time without injury will increase Outcome: Progressing   Problem: Education: Goal: Ability to state activities that reduce stress will improve Outcome: Progressing   Problem: Education: Goal: Knowledge of [place] [facility] Education information/materials will improve Outcome: Progressing Goal: Emotional status will improve Outcome: Progressing Goal: Mental status will improve Outcome: Progressing Goal: Verbalization of understanding the information provided will improve Outcome: Progressing   Problem: Activity: Goal: Interest or engagement in activities will improve Outcome: Progressing Goal: Sleeping patterns will improve Outcome: Progressing   Problem: Coping: Goal: Ability to verbalize frustrations and  anger appropriately will improve Outcome: Progressing Goal: Ability to demonstrate self-control will improve Outcome: Progressing   Problem: Health Behavior/Discharge Planning: Goal: Identification of resources available to assist in meeting health care needs will improve Outcome: Progressing Goal: Compliance with treatment plan for underlying cause of condition will improve Outcome: Progressing   Problem: Physical Regulation: Goal: Ability to maintain clinical measurements within normal limits will improve Outcome: Progressing   Problem: Safety: Goal: Periods of time without injury will increase Outcome: Progressing

## 2020-02-16 DIAGNOSIS — F4325 Adjustment disorder with mixed disturbance of emotions and conduct: Secondary | ICD-10-CM | POA: Diagnosis not present

## 2020-02-16 NOTE — Plan of Care (Signed)
mostly in room, calm and cooperative. Eating well and taking medications. Has no concerns so far. Safety monitored as expected.
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
## 2020-02-16 NOTE — Tx Team (Signed)
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 acetaminophen (TYLENOL) tablet 650 mg  650 mg Oral Q6H PRN [name], [name], NP      . alum & mag hydroxide-simeth (MAALOX/MYLANTA) 200-200-20 MG/5ML suspension 30 mL  30 mL Oral Q4H PRN [name], [name], NP      . amLODipine (NORVASC) tablet 5 mg  5 mg Oral Daily Salley Scarlet, MD   5 mg at 02/16/20 2409  . ARIPiprazole (ABILIFY) tablet 5 mg  5 mg Oral QHS [name], [name], NP   5 mg at 02/15/20 2130  . doxepin (SINEQUAN) capsule 20 mg  20 mg Oral QHS [name], [name], NP   20 mg at 02/15/20 2130  . folic acid (FOLVITE) tablet 1 mg  1 mg Oral Daily [name], [name], NP   1 mg at 02/16/20 0813  . hydrOXYzine (ATARAX/VISTARIL) tablet 50 mg  50 mg Oral Q6H PRN [name], [name], MD   50 mg at 02/10/20 1150  . lamoTRIgine (LAMICTAL) tablet 125 mg  125 mg Oral Daily [name], NP   125 mg at 02/16/20 0811  . lithium carbonate capsule 150 mg  150 mg Oral BID WC [name], [name], NP   150 mg at 02/16/20 7353  . magnesium hydroxide (MILK OF MAGNESIA) suspension 30 mL  30 mL Oral Daily PRN [name], [name], NP      . melatonin tablet 2.5 mg  2.5 mg Oral QHS [name], [name], NP   2.5 mg at 02/15/20 2130  . mirtazapine (REMERON) tablet 30 mg  30 mg Oral QHS [name], [name], NP   30 mg at 02/15/20 2130  . nicotine (NICODERM CQ - dosed in mg/24 hours) patch 21 mg  21 mg Transdermal Daily [name], [name], MD   21 mg at  02/16/20 0811  . OLANZapine zydis (ZYPREXA) disintegrating tablet 10 mg  10 mg Oral TID PRN Rulon Sera, MD       Or  . OLANZapine (ZYPREXA) injection 10 mg  10 mg Intramuscular TID PRN Rulon Sera, MD      . propranolol (INDERAL) tablet 60 mg  60 mg Oral BID Salley Scarlet, MD   60 mg at 02/16/20 0813  . trihexyphenidyl (ARTANE) tablet 2 mg  2 mg Oral BID WC [name], [name], NP   2 mg at 02/16/20 0813   PTA Medications: Medications Prior to Admission  Medication Sig Dispense Refill Last Dose  . ARIPiprazole Lauroxil ER 1064 MG/3.9ML PRSY Inject into the muscle.     . benztropine (COGENTIN) 2 MG tablet Take 2 mg by mouth 2 (two) times daily.     . folic acid (FOLVITE) 1 MG tablet Take 1 mg by mouth daily.     Marland Kitchen LAMICTAL 100 MG tablet Take 100 mg by mouth daily.     . Melatonin 3-10 MG TABS Take 1 tablet by mouth at bedtime.     . mirtazapine (REMERON) 15 MG tablet Take 15 mg by mouth at bedtime.  Patient Stressors: Financial difficulties Medication change or noncompliance  Patient Strengths: Motivation for treatment/growth Religious Affiliation Supportive family/friends  Treatment Modalities: Medication Management, Group therapy, Case management,  1 to 1 session with clinician, Psychoeducation, Recreational therapy.   Physician Treatment Plan for Primary Diagnosis: Adjustment disorder with mixed disturbance of emotions and conduct Long Term Goal(s):     Short Term Goals:    Medication Management: Evaluate patient's response, side effects, and tolerance of medication regimen.  Therapeutic Interventions: 1 to 1 sessions, Unit Group sessions and Medication administration.  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  Therapeutic Interventions: 1 to 1 sessions, Unit Group sessions and Medication administration.  Evaluation of Outcomes: Progressing   RN Treatment Plan for Primary Diagnosis: Adjustment disorder with mixed disturbance of emotions and conduct Long Term Goal(s): Knowledge of disease and therapeutic regimen to maintain health will improve  Short Term Goals: Ability to demonstrate self-control, Ability to participate in decision making will improve, Ability to verbalize feelings will improve, Ability to identify and develop effective coping behaviors will improve and Compliance with prescribed medications will improve  Medication Management: RN will administer medications as ordered by provider, will assess and evaluate patient's response and provide education to patient for prescribed medication. RN will report any adverse and/or side effects to prescribing provider.  Therapeutic Interventions: 1 on 1 counseling sessions, Psychoeducation, Medication administration, Evaluate responses to treatment, Monitor vital signs and CBGs as ordered, Perform/monitor CIWA, COWS, AIMS and Fall Risk screenings as ordered, Perform wound care treatments as ordered.  Evaluation of Outcomes: Progressing   LCSW Treatment Plan for Primary Diagnosis: Adjustment disorder with mixed disturbance of emotions and conduct Long Term Goal(s): Safe transition to appropriate next level of care at discharge, Engage patient in therapeutic group addressing interpersonal concerns.  Short Term Goals: Engage patient in aftercare planning with referrals and resources, Increase social support, Increase ability to appropriately verbalize feelings, Increase emotional regulation and Increase skills for wellness and recovery  Therapeutic Interventions: Assess for all discharge needs, 1 to 1  time with Social worker, Explore available resources and support systems, Assess for adequacy in community support network, Educate family and significant other(s) on suicide prevention, Complete Psychosocial Assessment, Interpersonal group therapy.  Evaluation of Outcomes: Progressing   Progress in Treatment: Attending groups: No. Participating in groups: No. Taking medication as prescribed: Yes. Toleration medication: Yes. Family/Significant other contact made: Yes, individual(s) contacted:  [name], Legal Guardian  Patient understands diagnosis: Yes. Discussing patient identified problems/goals with staff: Yes. Medical problems stabilized or resolved: Yes. Denies suicidal/homicidal ideation: Yes. Issues/concerns per patient self-inventory: No. Other: N/A   New problem(s) identified: No, Describe:  None   New Short Term/Long Term Goal(s): Elimination of symptoms of psychosis, medication management for mood stabilization; development of comprehensive mental wellness plan. Update 01/17/2020: No changes at this time. Update 01/22/2020: No changes at this time.Update 01/28/2020: No changes at this time. Update 02/02/2020: No changes Update 02/07/2020:  No changes at this time. Update 02/12/2020: No changes at this time. Update 02/16/20: No changes at this time.   Patient Goals: Patient stated that he would like to live  somewhere else. Patient does not want to return to his group home. Update 01/17/2020: No changes at this time. Update 01/22/2020: No changes at this time.Update 01/28/2020: No changes at this time. Update 02/02/2020: No changes Update 02/07/2020:  No changes at this time. Update 02/12/20: No changes at this time. Update 02/16/20: No changes at this time    Discharge Plan or Barriers:  CSW will discuss appropriate plan for discharge. Update 01/17/2020: Patient is not able to return to his group home. CSW will assist his guardian as she looks for placement for the  patient. Update 01/22/2020: Guardian reports that she has only attempted to contact 5 homes since July when patient was made aware that he would have to leave the home. CSW will to continue to assist the patient in identifying placement. Update 01/28/2020: Patient remains on unit pending placement. CSW will continue to search. Patient now has a Care Coordinator that is assisting. Per [facility] will not pay for additional resources and only one Group Home high within their catchment area and that has a long wait list. Patient will remain on the unit until placement is found. Update 02/02/2020: CSW continues to look for placement options.  Update 02/07/2020:  CSW team and CMA continue to look for placement.  Pt's care coordinator also continues to assist with placement. Update 02/12/2020: No changes at this time. CSW team and CMA continue to look for placement. Update 02/16/20: No changes at this time  Reason for Continuation of Hospitalization: Hallucinations Medication stabilization  Estimated Length of Stay: TBD   Attendees: Patient: 02/16/2020 11:16 AM  Physician: Dr. Danella Sensing, MD 02/16/2020 11:16 AM  Nursing:  02/16/2020 11:16 AM  RN Care Manager: 02/16/2020 11:16 AM  Social Worker: Raina Mina, [place]  02/16/2020 11:16 AM  Recreational Therapist:  02/16/2020 11:16 AM  Other:  02/16/2020 11:16 AM  Other:  02/16/2020 11:16 AM  Other: 02/16/2020 11:16 AM    Scribe for Treatment Team: Raina Mina, [place] 02/16/2020 11:16 AM

## 2020-02-16 NOTE — Progress Notes (Signed)
Patient has been pleasant and cooperative. When asked why he cussed out the interviewer from the group home, he responded "they don't know me". Denies voices, SI or HI

## 2020-02-16 NOTE — Plan of Care (Signed)
  Problem: Education: Goal: Knowledge of [facility] Education information/materials will improve Outcome: Progressing Goal: Emotional status will improve Outcome: Progressing Goal: Mental status will improve Outcome: Progressing Goal: Verbalization of understanding the information provided will improve Outcome: Progressing   Problem: Safety: Goal: Periods of time without injury will increase Outcome: Progressing   Problem: Education: Goal: Ability to state activities that reduce stress will improve Outcome: Progressing   Problem: Education: Goal: Knowledge of [facility] Education information/materials will improve Outcome: Progressing Goal: Emotional status will improve Outcome: Progressing Goal: Mental status will improve Outcome: Progressing Goal: Verbalization of understanding the information provided will improve Outcome: Progressing   Problem: Activity: Goal: Interest or engagement in activities will improve Outcome: Progressing Goal: Sleeping patterns will improve Outcome: Progressing   Problem: Coping: Goal: Ability to verbalize frustrations and anger appropriately will improve Outcome: Progressing Goal: Ability to demonstrate self-control will improve Outcome: Progressing   Problem: Health Behavior/Discharge Planning: Goal: Identification of resources available to assist in meeting health care needs will improve Outcome: Progressing Goal: Compliance with treatment plan for underlying cause of condition will improve Outcome: Progressing   Problem: Physical Regulation: Goal: Ability to maintain clinical measurements within normal limits will improve Outcome: Progressing   Problem: Safety: Goal: Periods of time without injury will increase Outcome: Progressing

## 2020-02-16 NOTE — [facility] Group Notes (Signed)
  [facility] LCSW Group Therapy Note  Date/Time:  02/16/2020 1:00 PM-2:10 PM   Type of Therapy and Topic:  Group Therapy:  Feelings About Hospitalization  Participation Level:  Did Not Attend   Description of Group This process group involved patients discussing their feelings related to being hospitalized, as well as the benefits they see to being in the hospital.  These feelings and benefits were itemized.  The group then brainstormed specific ways in which they could seek those same benefits when they discharge and return home.  Therapeutic Goals 1. Patient will identify and describe positive and negative feelings related to hospitalization 2. Patient will verbalize benefits of hospitalization to themselves personally 3. Patients will brainstorm together ways they can obtain similar benefits in the outpatient setting, identify barriers to wellness and possible solutions  Summary of Patient Progress:  Did not attend   Therapeutic Modalities Cognitive G. L. Garcia, [place] 02/16/2020  4:25 PM

## 2020-02-17 DIAGNOSIS — F4325 Adjustment disorder with mixed disturbance of emotions and conduct: Secondary | ICD-10-CM | POA: Diagnosis not present

## 2020-02-17 NOTE — Plan of Care (Signed)
D- Patient alert and oriented. Patient presented in a pleasant mood on assessment stating that he slept good last night and had no complaints to voice to this Probation officer. Patient denied SI, HI, AVH, and pain at this time. Patient also denied any signs/symptoms of depression/anxiety, reporting that overall, he is feeling "good". Patient's goal for today is to "think positive". Patient still isolates to his room, except for meals and medication, however, he does ask to go outside into the courtyard, and will stay out as long as he can.  A- Scheduled medications administered to patient, per MD orders. Support and encouragement provided.  Routine safety checks conducted every 15 minutes.  Patient informed to notify staff with problems or concerns.  R- No adverse drug reactions noted. Patient contracts for safety at this time. Patient compliant with medications. Patient receptive, calm, and cooperative. Patient interacts well with others on the unit. Patient remains safe at this time.  Problem: Education: Goal: Knowledge of [facility] Education information/materials will improve Outcome: Progressing Goal: Emotional status will improve Outcome: Progressing Goal: Mental status will improve Outcome: Progressing Goal: Verbalization of understanding the information provided will improve Outcome: Progressing   Problem: Safety: Goal: Periods of time without injury will increase Outcome: Progressing   Problem: Education: Goal: Ability to state activities that reduce stress will improve Outcome: Progressing   Problem: Education: Goal: Knowledge of [facility] Education information/materials will improve Outcome: Progressing Goal: Emotional status will improve Outcome: Progressing Goal: Mental status will improve Outcome: Progressing Goal: Verbalization of understanding the information provided will improve Outcome: Progressing   Problem: Activity: Goal: Interest or engagement in  activities will improve Outcome: Progressing Goal: Sleeping patterns will improve Outcome: Progressing   Problem: Coping: Goal: Ability to verbalize frustrations and anger appropriately will improve Outcome: Progressing Goal: Ability to demonstrate self-control will improve Outcome: Progressing   Problem: Health Behavior/Discharge Planning: Goal: Identification of resources available to assist in meeting health care needs will improve Outcome: Progressing Goal: Compliance with treatment plan for underlying cause of condition will improve Outcome: Progressing   Problem: Physical Regulation: Goal: Ability to maintain clinical measurements within normal limits will improve Outcome: Progressing   Problem: Safety: Goal: Periods of time without injury will increase Outcome: Progressing

## 2020-02-17 NOTE — Progress Notes (Signed)
Patient alert and oriented x 4, affect is blunted, thoughts are organized and coherent, he denies SI/HI/AVH, he was visible in the milieu noted interacting appropriately with peers and staff. Patient is complaint with medication regimen, 15 minutes safety checks maintained will continue to monitor.
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
## 2020-02-17 NOTE — BHH Group Notes (Signed)
LCSW Group Therapy Note   02/17/2020 1:05 PM- 2:00 PM    Type of Therapy and Topic: Group Therapy: Fears and Unhealthy Coping Skills   Participation Level: Did Not Attend   Description of Group: The focus of this group was to discuss some of the prevalent fears that patients experience, and to identify the commonalities among group members. An exercise was used to initiate the discussion, followed by writing on the white board a group-generated list of unhealthy coping and healthy coping techniques to deal with each fear.   Therapeutic Goals: 1. Patient will identify and describe 3 fears they experience 2. Patient will identify one positive coping strategy for each fear they experience 3. Patient will respond empathically to peers statements regarding fears they experience   Summary of Patient Progress: Did Not Attend     Therapeutic Modalities Cognitive Behavioral Therapy Motivational Interviewing    Raina Mina, [place] 02/17/2020 2:53 PM

## 2020-02-18 NOTE — Plan of Care (Signed)
  Problem: Education: Goal: Emotional status will improve Outcome: Progressing Goal: Mental status will improve Outcome: Progressing Goal: Verbalization of understanding the information provided will improve Outcome: Progressing   Problem: Safety: Goal: Periods of time without injury will increase Outcome: Progressing   

## 2020-02-18 NOTE — Progress Notes (Signed)
Patient pleasant and cooperative. Isolative to self and room. Came out requesting a snack at 1030. Reminded patient to come when snacks are called in the future. Denies all. Encouragement and support provided. Safety checks maintained. Medications received as prescribed. Pt receptive and remains safe on unit with q 15 min checks.
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
## 2020-02-18 NOTE — Plan of Care (Signed)
Patient is more visible in the milieu and coming to medication room to get medicine.Denies SI,HI and AVH.Appetite and energy level good.Attended groups outside. No irritable behaviors noted.Support and encouragement given.

## 2020-02-18 NOTE — BHH Counselor (Addendum)
CSW faxed referral for [place].  CSW received confirmation that the fax was successful.  CSW completed initial interview with Romani at [facility]. He reports that an exception form will need to be completed.  He reports that CSW has 72 hours to send diversion form.  Assunta Curtis, MSW, LCSW 02/18/2020 3:54 PM

## 2020-02-18 NOTE — Plan of Care (Signed)
  Problem: Group Participation Goal: STG - Patient will engage in groups without prompting or encouragement from LRT x3 group sessions within 5 recreation therapy group sessions Description: STG - Patient will engage in groups without prompting or encouragement from LRT x3 group sessions within 5 recreation therapy group sessions Outcome: Not Progressing   

## 2020-02-18 NOTE — [facility] Group Notes (Signed)
LCSW Group Therapy Note   02/18/2020 1:00 PM  Type of Therapy and Topic:  Group Therapy:  Overcoming Obstacles   Participation Level:  Did Not Attend   Description of Group:    In this group patients will be encouraged to explore what they see as obstacles to their own wellness and recovery. They will be guided to discuss their thoughts, feelings, and behaviors related to these obstacles. The group will process together ways to cope with barriers, with attention given to specific choices patients can make. Each patient will be challenged to identify changes they are motivated to make in order to overcome their obstacles. This group will be process-oriented, with patients participating in exploration of their own experiences as well as giving and receiving support and challenge from other group members.   Therapeutic Goals: 1. Patient will identify personal and current obstacles as they relate to admission. 2. Patient will identify barriers that currently interfere with their wellness or overcoming obstacles.  3. Patient will identify feelings, thought process and behaviors related to these barriers. 4. Patient will identify two changes they are willing to make to overcome these obstacles:      Summary of Patient Progress X   Therapeutic Modalities:   Cognitive Behavioral Therapy Solution Focused Therapy Motivational Interviewing Relapse Prevention Therapy  Emmylou Bieker, MSW, LCSW 02/18/2020 12:53 PM    

## 2020-02-18 NOTE — Progress Notes (Signed)
Recreation Therapy Notes   Date: 02/18/2020  Time: 9:30 am   Location: Craft room     Behavioral response: N/A   Intervention Topic: Necessities   Discussion/Intervention: Patient did not attend group.   Clinical Observations/Feedback:  Patient did not attend group.   Landon Bassford LRT/CTRS        Ryan Zuniga 02/18/2020 11:55 AM

## 2020-02-19 NOTE — Care Management Note (Signed)
CMA contacted [place]  At [phone] to inquire about bed availability, beds are available, FL[facility], [facility] and most recent physician progress notes faxed to [phone]

## 2020-02-19 NOTE — Progress Notes (Signed)
Patient alert and oriented x 4, affect is blunted, thoughts are organized and coherent, he denies SI/HI/AVH, he was noted in the milieu interacting appropriately with peers and staff, no distress noted. Patient is complaint with medication regimen, 15 minutes safety checks maintained will continue to monitor.

## 2020-02-19 NOTE — Plan of Care (Addendum)
Pt rates depression and anxiety both 1/10. Pt denies SI, HI and AVH, Pt was educated on care plan and verbalizes understanding. Pt encouraged to attend groups.  Collier Bullock RN Problem: Education: Goal: Freight forwarder Education information/materials will improve Outcome: Progressing Goal: Emotional status will improve Outcome: Progressing Goal: Mental status will improve Outcome: Progressing Goal: Verbalization of understanding the information provided will improve Outcome: Progressing   Problem: Safety: Goal: Periods of time without injury will increase Outcome: Progressing   Problem: Education: Goal: Knowledge of [place] [facility] Education information/materials will improve Outcome: Progressing Goal: Emotional status will improve Outcome: Progressing Goal: Mental status will improve Outcome: Progressing Goal: Verbalization of understanding the information provided will improve Outcome: Progressing   Problem: Activity: Goal: Interest or engagement in activities will improve Outcome: Not Progressing Goal: Sleeping patterns will improve Outcome: Not Progressing   Problem: Coping: Goal: Ability to verbalize frustrations and anger appropriately will improve Outcome: Progressing Goal: Ability to demonstrate self-control will improve Outcome: Progressing   Problem: Health Behavior/Discharge Planning: Goal: Identification of resources available to assist in meeting health care needs will improve Outcome: Progressing Goal: Compliance with treatment plan for underlying cause of condition will improve Outcome: Progressing   Problem: Physical Regulation: Goal: Ability to maintain clinical measurements within normal limits will improve Outcome: Progressing   Problem: Safety: Goal: Periods of time without injury will increase Outcome: Progressing   Problem: Education: Goal: Ability to state activities that reduce stress will improve Outcome: Progressing

## 2020-02-19 NOTE — Progress Notes (Signed)
D- Patient alert and oriented. Affect/mood is cooperative, calm and withdrawn. Pt denies SI, HI, AVH, and pain. Pt has no complaints and did not attend groups.  A- Scheduled medications administered to patient, per MD orders. Support and encouragement provided.  Routine safety checks conducted every 15 minutes.  Patient informed to notify staff with problems or concerns.  R- No adverse drug reactions noted. Patient contracts for safety at this time. Patient compliant with medications and treatment plan. Patient receptive, calm, and cooperative. Patient interacts well with others on the unit.  Patient remains safe at this time.  Collier Bullock RN

## 2020-02-19 NOTE — [facility] Group Notes (Signed)
LCSW Group Therapy Note  02/19/2020 2:33 PM  Type of Therapy/Topic:  Group Therapy:  Feelings about Diagnosis  Participation Level:  Did Not Attend   Description of Group:   This group will allow patients to explore their thoughts and feelings about diagnoses they have received. Patients will be guided to explore their level of understanding and acceptance of these diagnoses. Facilitator will encourage patients to process their thoughts and feelings about the reactions of others to their diagnosis and will guide patients in identifying ways to discuss their diagnosis with significant others in their lives. This group will be process-oriented, with patients participating in exploration of their own experiences, giving and receiving support, and processing challenge from other group members.   Therapeutic Goals: 1. Patient will demonstrate understanding of diagnosis as evidenced by identifying two or more symptoms of the disorder 2. Patient will be able to express two feelings regarding the diagnosis 3. Patient will demonstrate their ability to communicate their needs through discussion and/or role play  Summary of Patient Progress: X  Therapeutic Modalities:   Cognitive Behavioral Therapy Brief Therapy Feelings Identification   Lacara Dunsworth R. Guerry Bruin, MSW, [place], [place] 02/19/2020 2:33 PM

## 2020-02-19 NOTE — Progress Notes (Signed)
Recreation Therapy Notes   Date: 02/19/2020  Time: 9:30 am   Location: Craft room     Behavioral response: N/A   Intervention Topic: Goals   Discussion/Intervention: Patient did not attend group.   Clinical Observations/Feedback:  Patient did not attend group.   Naleah Kofoed LRT/CTRS         Nobuko Gsell 02/19/2020 12:54 PM

## 2020-02-19 NOTE — B[facility] Counselor (Signed)
CSW spoke with Theadora Rama, the pt's care coordinator to inquire about the exemption form.  CSW was informed that Katharine Look did not feel that she could adequately answer questions and suggested that CSW call Lillia Abed.   CSW called Lillia Abed who reports that CSW needs to complete the form and send to [facility] for authorization.  He reports that the exemption form would be denied if patient is not currently a danger to self/others.  He reports that he would call back with a fax number.   Assunta Curtis, MSW, LCSW 02/19/2020 3:36 PM
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
## 2020-02-20 NOTE — Plan of Care (Signed)
Pt rates depression and anxiety both 2/10. Pt denies SI, HI and AVH. Pt was educated on care plan and verbalizes understanding. Collier Bullock RN Problem: Education: Goal: Freight forwarder Education information/materials will improve Outcome: Progressing Goal: Emotional status will improve Outcome: Progressing Goal: Mental status will improve Outcome: Progressing Goal: Verbalization of understanding the information provided will improve Outcome: Progressing   Problem: Safety: Goal: Periods of time without injury will increase Outcome: Progressing   Problem: Education: Goal: Knowledge of [place] [facility] Education information/materials will improve Outcome: Progressing Goal: Emotional status will improve Outcome: Progressing Goal: Mental status will improve Outcome: Progressing Goal: Verbalization of understanding the information provided will improve Outcome: Progressing   Problem: Activity: Goal: Interest or engagement in activities will improve Outcome: Progressing Goal: Sleeping patterns will improve Outcome: Progressing   Problem: Coping: Goal: Ability to verbalize frustrations and anger appropriately will improve Outcome: Progressing Goal: Ability to demonstrate self-control will improve Outcome: Progressing   Problem: Health Behavior/Discharge Planning: Goal: Identification of resources available to assist in meeting health care needs will improve Outcome: Progressing Goal: Compliance with treatment plan for underlying cause of condition will improve Outcome: Progressing   Problem: Physical Regulation: Goal: Ability to maintain clinical measurements within normal limits will improve Outcome: Progressing   Problem: Safety: Goal: Periods of time without injury will increase Outcome: Progressing   Problem: Education: Goal: Ability to state activities that reduce stress will improve Outcome: Progressing

## 2020-02-20 NOTE — Progress Notes (Signed)
Recreation Therapy Notes   Date: 02/20/2020  Time: 9:30 am   Location: Craft room     Behavioral response: N/A   Intervention Topic: Problem Solving    Discussion/Intervention: Patient did not attend group.   Clinical Observations/Feedback:  Patient did not attend group.   Lot [place] LRT/CTRS        Koreen Lizaola 02/20/2020 11:58 AM
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
## 2020-02-20 NOTE — Progress Notes (Signed)
Patient alert and oriented x 4, affect is blunted, thoughts are organized and coherent, he appears less anxious no angry outburst, he denies SI/HI/AVH, he was visible in the milieu, seen interacting appropriately with peers and staff. Patient is complaint with medication regimen, 15 minutes safety checks maintained will continue to monitor.

## 2020-02-21 NOTE — Progress Notes (Signed)
Recreation Therapy Notes   Date: 02/21/2020  Time: 9:30 am   Location: Craft room     Behavioral response: N/A   Intervention Topic: Leisure   Discussion/Intervention: Patient did not attend group.   Clinical Observations/Feedback:  Patient did not attend group.   Mekhia Brogan LRT/CTRS        [place] 02/21/2020 12:40 PM

## 2020-02-21 NOTE — [facility] Group Notes (Signed)
[facility] Group Notes:  (Nursing/MHT/Case Management/Adjunct)  Date:  02/21/2020  Time:  10:37 PM  Type of Therapy:  Group Therapy  Participation Level:  Did Not Attend   Ryan Zuniga 02/21/2020, 10:37 PM

## 2020-02-21 NOTE — Plan of Care (Signed)
  Problem: Education: Goal: Emotional status will improve Outcome: Progressing Goal: Mental status will improve Outcome: Progressing Goal: Verbalization of understanding the information provided will improve Outcome: Progressing   Problem: Safety: Goal: Periods of time without injury will increase Outcome: Progressing   

## 2020-02-21 NOTE — Progress Notes (Signed)
Patient isolative to room. Out for snacks this evening. Denies any SI, HI, AVH. Pleasant in no distress. Remains safe on unit with q 15 min checks.

## 2020-02-21 NOTE — [facility] Group Notes (Signed)
LCSW Group Therapy Note  02/21/2020 2:31 PM  Type of Therapy/Topic:  Group Therapy:  Balance in Life  Participation Level:  Did Not Attend  Description of Group:    This group will address the concept of balance and how it feels and looks when one is unbalanced. Patients will be encouraged to process areas in their lives that are out of balance and identify reasons for remaining unbalanced. Facilitators will guide patients in utilizing problem-solving interventions to address and correct the stressor making their life unbalanced. Understanding and applying boundaries will be explored and addressed for obtaining and maintaining a balanced life. Patients will be encouraged to explore ways to assertively make their unbalanced needs known to significant others in their lives, using other group members and facilitator for support and feedback.  Therapeutic Goals: 1. Patient will identify two or more emotions or situations they have that consume much of in their lives. 2. Patient will identify signs/triggers that life has become out of balance:  3. Patient will identify two ways to set boundaries in order to achieve balance in their lives:  4. Patient will demonstrate ability to communicate their needs through discussion and/or role plays  Summary of Patient Progress: X  Therapeutic Modalities:   Cognitive Behavioral Therapy Solution-Focused Therapy Assertiveness Training  Hedy Camara R. Guerry Bruin, MSW, [place], [place] 02/21/2020 2:31 PM

## 2020-02-21 NOTE — BHH Counselor (Signed)
CSW called Jasmine at [facility] who declines patient at this time due to "not being a good fit for our community and his history of past behaviors".    Assunta Curtis, MSW, LCSW 02/21/2020 3:33 PM
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
## 2020-02-21 NOTE — Tx Team (Signed)
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   Acute psychosis ([place])   Current Medications:  Current Facility-Administered Medications  Medication Dose Route Frequency Provider Last Rate Last Admin   acetaminophen (TYLENOL) tablet 650 mg  650 mg Oral Q6H PRN [name], NP       alum & mag hydroxide-simeth (MAALOX/MYLANTA) 200-200-20 MG/5ML suspension 30 mL  30 mL Oral Q4H PRN [name], [name], NP       amLODipine (NORVASC) tablet 5 mg  5 mg Oral Daily Selina Cooley M, MD   5 mg at 02/21/20 8676   ARIPiprazole (ABILIFY) tablet 5 mg  5 mg Oral QHS [name], [name], NP   5 mg at 02/20/20 2037   doxepin (SINEQUAN) capsule 20 mg  20 mg Oral QHS [name], [name], NP   20 mg at 19/50/93 2671   folic acid (FOLVITE) tablet 1 mg  1 mg Oral Daily [name], [name], NP   1 mg at 02/21/20 2458   hydrOXYzine (ATARAX/VISTARIL) tablet 50 mg  50 mg Oral Q6H PRN [name], [name] T, MD   50 mg at 02/10/20 1150   lamoTRIgine (LAMICTAL) tablet 125 mg  125 mg Oral Daily Anette Riedel M, NP   125 mg at 02/21/20 0998   lithium carbonate capsule 150 mg  150 mg Oral BID WC [name], [name], NP   150 mg at 02/21/20 3382   magnesium hydroxide (MILK OF MAGNESIA) suspension 30 mL  30 mL Oral Daily PRN [name], NP       melatonin tablet 2.5 mg  2.5 mg Oral QHS [name], [name], NP   2.5 mg at 02/20/20 2036   mirtazapine (REMERON) tablet 30 mg  30 mg Oral QHS [name], [name], NP   30 mg at 02/20/20 2036   nicotine (NICODERM CQ - dosed in mg/24 hours) patch 21 mg  21 mg Transdermal Daily [name], [name] T, MD   21 mg at  02/20/20 0752   OLANZapine zydis (ZYPREXA) disintegrating tablet 10 mg  10 mg Oral TID PRN Rulon Sera, MD       Or   OLANZapine (ZYPREXA) injection 10 mg  10 mg Intramuscular TID PRN Rulon Sera, MD       propranolol (INDERAL) tablet 60 mg  60 mg Oral BID Salley Scarlet, MD   60 mg at 02/21/20 0820   trihexyphenidyl (ARTANE) tablet 2 mg  2 mg Oral BID WC [name], [name], NP   2 mg at 02/21/20 5053   PTA Medications: Medications Prior to Admission  Medication Sig Dispense Refill Last Dose   ARIPiprazole Lauroxil ER 1064 MG/3.9ML PRSY Inject into the muscle.      benztropine (COGENTIN) 2 MG tablet Take 2 mg by mouth 2 (two) times daily.      folic acid (FOLVITE) 1 MG tablet Take 1 mg by mouth daily.      LAMICTAL 100 MG tablet Take 100 mg by mouth daily.      Melatonin 3-10 MG TABS Take 1 tablet by mouth at bedtime.      mirtazapine (REMERON) 15 MG tablet Take 15 mg by mouth at bedtime.  Patient Stressors: Financial difficulties Medication change or noncompliance  Patient Strengths: Motivation for treatment/growth Religious Affiliation Supportive family/friends  Treatment Modalities: Medication Management, Group therapy, Case management,  1 to 1 session with clinician, Psychoeducation, Recreational therapy.   Physician Treatment Plan for Primary Diagnosis: Adjustment disorder with mixed disturbance of emotions and conduct Long Term Goal(s):     Short Term Goals:    Medication Management: Evaluate patient's response, side effects, and tolerance of medication regimen.  Therapeutic Interventions: 1 to 1 sessions, Unit Group sessions and Medication administration.  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  Therapeutic Interventions: 1 to 1 sessions, Unit Group sessions and Medication administration.  Evaluation of Outcomes: Progressing   RN Treatment Plan for Primary Diagnosis: Adjustment disorder with mixed disturbance of emotions and conduct Long Term Goal(s): Knowledge of disease and therapeutic regimen to maintain health will improve  Short Term Goals: Ability to demonstrate self-control, Ability to participate in decision making will improve, Ability to verbalize feelings will improve, Ability to identify and develop effective coping behaviors will improve and Compliance with prescribed medications will improve  Medication Management: RN will administer medications as ordered by provider, will assess and evaluate patient's response and provide education to patient for prescribed medication. RN will report any adverse and/or side effects to prescribing provider.  Therapeutic Interventions: 1 on 1 counseling sessions, Psychoeducation, Medication administration, Evaluate responses to treatment, Monitor vital signs and CBGs as ordered, Perform/monitor CIWA, COWS, AIMS and Fall Risk screenings as ordered, Perform wound care treatments as ordered.  Evaluation of Outcomes: Progressing   LCSW Treatment Plan for Primary Diagnosis: Adjustment disorder with mixed disturbance of emotions and conduct Long Term Goal(s): Safe transition to appropriate next level of care at discharge, Engage patient in therapeutic group addressing interpersonal concerns.  Short Term Goals: Engage patient in aftercare planning with referrals and resources, Increase social support, Increase ability to appropriately verbalize feelings, Increase emotional regulation and Increase skills for wellness and recovery  Therapeutic Interventions: Assess for all discharge needs, 1 to 1  time with Social worker, Explore available resources and support systems, Assess for adequacy in community support network, Educate family and significant other(s) on suicide prevention, Complete Psychosocial Assessment, Interpersonal group therapy.  Evaluation of Outcomes: Progressing   Progress in Treatment: Attending groups: No. Participating in groups: No. Taking medication as prescribed: Yes. Toleration medication: Yes. Family/Significant other contact made: Yes, individual(s) contacted:  [name], Legal Guardian  Patient understands diagnosis: Yes. Discussing patient identified problems/goals with staff: Yes. Medical problems stabilized or resolved: Yes. Denies suicidal/homicidal ideation: Yes. Issues/concerns per patient self-inventory: No. Other: N/A   New problem(s) identified: No, Describe:  None   New Short Term/Long Term Goal(s): Elimination of symptoms of psychosis, medication management for mood stabilization; development of comprehensive mental wellness plan. Update 01/17/2020: No changes at this time. Update 01/22/2020: No changes at this time.Update 01/28/2020: No changes at this time. Update 02/02/2020: No changes Update 02/07/2020:  No changes at this time. Update 02/12/2020: No changes at this time. Update 02/16/20: No changes at this time. Update 02/21/20: No changes at this time.  Patient Goals: Patient stated  that he would like to live somewhere else. Patient does not want to return to his group home. Update 01/17/2020: No changes at this time. Update 01/22/2020: No changes at this time.Update 01/28/2020: No changes at this time. Update 02/02/2020: No changes Update 02/07/2020:  No changes at this time. Update 02/12/20: No changes at this time. Update 02/16/20: No changes at this time. Update 02/21/20: No changes at this time.    Discharge Plan or Barriers:  CSW will discuss appropriate plan for discharge. Update 01/17/2020: Patient is not able to return to  his group home. CSW will assist his guardian as she looks for placement for the patient. Update 01/22/2020: Guardian reports that she has only attempted to contact 5 homes since July when patient was made aware that he would have to leave the home. CSW will to continue to assist the patient in identifying placement. Update 01/28/2020: Patient remains on unit pending placement. CSW will continue to search. Patient now has a Care Coordinator that is assisting. Per [facility] will not pay for additional resources and only one Group Home high within their catchment area and that has a long wait list. Patient will remain on the unit until placement is found. Update 02/02/2020: CSW continues to look for placement options.  Update 02/07/2020:  CSW team and CMA continue to look for placement.  Pt's care coordinator also continues to assist with placement. Update 02/12/2020: No changes at this time. CSW team and CMA continue to look for placement. Update 02/16/20: No changes at this time. Update 02/21/20: No changes at this time. CSW team and CMA continue to look for placement. Referral process for [facility] initiated per request.   Reason for Continuation of Hospitalization: Hallucinations Medication stabilization  Estimated Length of Stay: TBD   Attendees: Patient: 02/21/2020 10:43 AM  Physician: Selina Cooley, MD 02/21/2020 10:43 AM  Nursing:  02/21/2020 10:43 AM  RN Care Manager: 02/21/2020 10:43 AM  Social Worker: Chalmers Guest. Guerry Bruin, MSW, [place], [place] 02/21/2020 10:43 AM  Recreational Therapist:  02/21/2020 10:43 AM  Other: Assunta Curtis, MSW, LCSW 02/21/2020 10:43 AM  Other:  02/21/2020 10:43 AM  Other: 02/21/2020 10:43 AM    Scribe for Treatment Team: Shirl Harris, LCSW 02/21/2020 10:43 AM

## 2020-02-21 NOTE — Plan of Care (Signed)
Patient is appropriate with staff & peers.Compliant with medications.Denies SI,HI and AVH.Appetite and energy level good.Support and encouragement given.

## 2020-02-22 MED ORDER — ARIPIPRAZOLE 10 MG PO TABS
10.0000 mg | ORAL_TABLET | Freq: Every day | ORAL | Status: DC
  Administered 2020-02-22 – 2020-03-03 (×10): 10 mg via ORAL
  Filled 2020-02-22 (×11): qty 1

## 2020-02-22 NOTE — [facility] Group Notes (Signed)
LCSW Group Therapy Note  02/22/2020 2:45 PM  Type of Therapy/Topic:  Group Therapy:  Emotion Regulation  Participation Level:  Did Not Attend   Description of Group:   The purpose of this group is to assist patients in learning to regulate negative emotions and experience positive emotions. Patients will be guided to discuss ways in which they have been vulnerable to their negative emotions. These vulnerabilities will be juxtaposed with experiences of positive emotions or situations, and patients will be challenged to use positive emotions to combat negative ones. Special emphasis will be placed on coping with negative emotions in conflict situations, and patients will process healthy conflict resolution skills.  Therapeutic Goals: 1. Patient will identify two positive emotions or experiences to reflect on in order to balance out negative emotions 2. Patient will label two or more emotions that they find the most difficult to experience 3. Patient will demonstrate positive conflict resolution skills through discussion and/or role plays  Summary of Patient Progress:  X  Therapeutic Modalities:   Cognitive Behavioral Therapy Feelings Identification Dialectical Behavioral Therapy  Assunta Curtis, MSW, LCSW 02/22/2020 2:45 PM
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
## 2020-02-22 NOTE — Plan of Care (Signed)
  Problem: Education: Goal: Knowledge of [facility] Education information/materials will improve Outcome: Progressing Goal: Emotional status will improve Outcome: Progressing Goal: Mental status will improve Outcome: Progressing Goal: Verbalization of understanding the information provided will improve Outcome: Progressing   Problem: Safety: Goal: Periods of time without injury will increase Outcome: Progressing   Problem: Education: Goal: Ability to state activities that reduce stress will improve Outcome: Progressing   Problem: Education: Goal: Knowledge of  General Education information/materials will improve Outcome: Progressing Goal: Emotional status will improve Outcome: Progressing Goal: Mental status will improve Outcome: Progressing Goal: Verbalization of understanding the information provided will improve Outcome: Progressing   Problem: Activity: Goal: Interest or engagement in activities will improve Outcome: Progressing Goal: Sleeping patterns will improve Outcome: Progressing   Problem: Coping: Goal: Ability to verbalize frustrations and anger appropriately will improve Outcome: Progressing Goal: Ability to demonstrate self-control will improve Outcome: Progressing   Problem: Health Behavior/Discharge Planning: Goal: Identification of resources available to assist in meeting health care needs will improve Outcome: Progressing Goal: Compliance with treatment plan for underlying cause of condition will improve Outcome: Progressing   Problem: Physical Regulation: Goal: Ability to maintain clinical measurements within normal limits will improve Outcome: Progressing   Problem: Safety: Goal: Periods of time without injury will increase Outcome: Progressing

## 2020-02-22 NOTE — Progress Notes (Signed)
Patient has been pleasant and cooperative. Says he is hearing voices but denies that they are telling him bad things. Denies SI and HI

## 2020-02-22 NOTE — Progress Notes (Signed)
Recreation Therapy Notes   Date: 02/22/2020  Time: 9:30 am   Location: Craft room     Behavioral response: N/A   Intervention Topic: Emotions   Discussion/Intervention: Patient did not attend group.   Clinical Observations/Feedback:  Patient did not attend group.   Mase Dhondt LRT/CTRS           Naudia Crosley 02/22/2020 11:52 AM

## 2020-02-22 NOTE — Progress Notes (Signed)
Cooperative with treatment. Medication compliant. No issues to report on shift at this time.

## 2020-02-22 NOTE — Progress Notes (Signed)
Pt rates depression, anxiety both at 3/10. Pt denies SI, HI and SI. Pt was educated on care plan and verbalizes understanding. Pt encouraged to attend groups. Collier Bullock RN

## 2020-02-22 NOTE — [facility] Counselor (Signed)
CSW again called Claiborne Billings at [facility], [phone].  He reports that he would like to speak with pt's guardian and previous home to explore if patient would be a fit for his home.  CSW provided the contact information.    He reports that he will follow up next week.  Assunta Curtis, MSW, LCSW 02/22/2020 4:08 PM

## 2020-02-23 DIAGNOSIS — F4325 Adjustment disorder with mixed disturbance of emotions and conduct: Secondary | ICD-10-CM | POA: Diagnosis not present

## 2020-02-23 NOTE — Progress Notes (Signed)
While this writer was making rounds, patient apologized about his behavior earlier, stating "I was in a bad mood". This Probation officer accepted patient's apology and informed him to notify staff if he needs anything. Patient verbalized understanding.

## 2020-02-23 NOTE — Plan of Care (Signed)
D- Patient alert and oriented. Patient initially presented in a pleasant mood on assessment, and ever since earlier this morning, patient's mood switched to being very irritable. Patient has not spoken much to this Probation officer and has not let staff obtain a set of vitals on him, nor has he taken his medication. Patient has not complained of any SI, HI, AVH, and pain at this time. Patient had no stated goals for today. Patient has been either laying in bed, or just standing in the middle of his room, throughout the day.  A- Support and encouragement provided. Routine safety checks conducted every 15 minutes. Patient informed to notify staff with problems or concerns.  R- Patient contracts for safety at this time. Patient remains safe at this time.  Problem: Education: Goal: Knowledge of [facility] Education information/materials will improve Outcome: Not Progressing Goal: Emotional status will improve Outcome: Not Progressing Goal: Mental status will improve Outcome: Not Progressing Goal: Verbalization of understanding the information provided will improve Outcome: Not Progressing   Problem: Safety: Goal: Periods of time without injury will increase Outcome: Not Progressing   Problem: Education: Goal: Ability to state activities that reduce stress will improve Outcome: Not Progressing   Problem: Education: Goal: Knowledge of [place] [facility] Education information/materials will improve Outcome: Not Progressing Goal: Emotional status will improve Outcome: Not Progressing Goal: Mental status will improve Outcome: Not Progressing Goal: Verbalization of understanding the information provided will improve Outcome: Not Progressing   Problem: Activity: Goal: Interest or engagement in activities will improve Outcome: Not Progressing Goal: Sleeping patterns will improve Outcome: Not Progressing   Problem: Coping: Goal: Ability to verbalize frustrations and anger appropriately  will improve Outcome: Not Progressing Goal: Ability to demonstrate self-control will improve Outcome: Not Progressing   Problem: Health Behavior/Discharge Planning: Goal: Identification of resources available to assist in meeting health care needs will improve Outcome: Not Progressing Goal: Compliance with treatment plan for underlying cause of condition will improve Outcome: Not Progressing   Problem: Physical Regulation: Goal: Ability to maintain clinical measurements within normal limits will improve Outcome: Not Progressing   Problem: Safety: Goal: Periods of time without injury will increase Outcome: Not Progressing
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
## 2020-02-23 NOTE — Progress Notes (Signed)
Patient was in good spirits earlier this morning, stating that he was going to allow this writer to get a set of vitals on him and that he would take his morning medication. This writer went to go get patient around 0915, and he was pacing in his room and stated "no, I don't want it". Patient appeared to be upset, and continued pacing.

## 2020-02-23 NOTE — Progress Notes (Signed)
This writer went to ask patient if he would like for me to save his tray and he come down to eat after everyone else. Patient said "yes, thank-you". It appears that patient does not like to eat around the other members on the unit.

## 2020-02-23 NOTE — Progress Notes (Signed)
This writer went to ask patient how he was feeling and he stated "leave me alone". This Probation officer has not been able to fully assess patient at this time.

## 2020-02-23 NOTE — Progress Notes (Signed)
Patient stated that he was ok and didn't want to eat lunch. He did not eat breakfast either, however, he has been drinking fluids. Patient is laying in bed and did not express any concerns to this Probation officer.

## 2020-02-23 NOTE — [facility] Group Notes (Signed)
LCSW Aftercare Discharge Planning Group Note   02/23/2020 1:20pm-2:00pm   Type of Group and Topic: Psychoeducational Group:  Discharge Planning  Participation Level:  Did Not Attend  Description of Group  Discharge planning group reviews patient's anticipated discharge plans and assists patients to anticipate and address any barriers to wellness/recovery in the community.  Suicide prevention education is reviewed with patients in group.   Therapeutic Goals 1. Patients will state their anticipated discharge plan and mental health aftercare 2. Patients will identify potential barriers to wellness in the community setting 3. Patients will engage in problem solving, solution focused discussion of ways to anticipate and address barriers to wellness/recovery   Summary of Patient Progress: Did not attend     Therapeutic Modalities: Motivational Interviewing    Raina Mina, Latanya Presser 02/23/2020 2:37 PM

## 2020-02-23 NOTE — Progress Notes (Signed)
This writer went to go get patient to come eat dinner while no one is in the community room, however, patient stated "I don't want it".

## 2020-02-24 DIAGNOSIS — F4325 Adjustment disorder with mixed disturbance of emotions and conduct: Secondary | ICD-10-CM | POA: Diagnosis not present

## 2020-02-24 NOTE — Progress Notes (Signed)
After speaking with the doctor, patient decided to let this writer obtain a set of vitals on him, as well as, administer his morning medication. Patient took all of his medicine, except for the Propanolol.

## 2020-02-24 NOTE — Progress Notes (Signed)
Patient has already started his morning out in an agitated/irritable mood. Patient was heard talking on the phone cursing at someone. Patient was heard saying "motherfucker, go to [place]", and then he slammed the phone down and walked off.

## 2020-02-24 NOTE — Progress Notes (Signed)
Patient did go outside to the courtyard, for social work group, although he didn't stay for too long. Patient has also been seen out of his room on two separate occasions, walking down to the dayroom, and then back to his room, without any issues.

## 2020-02-24 NOTE — Plan of Care (Addendum)
D- Patient alert and oriented. Patient presents in a pleasant mood on assessment stating that he slept good last night and had no complaints to voice to this Probation officer. Patient denies SI, HI, AVH, and pain at this time. Patient also denies depression and anxiety, however, he is frustrated with being here, "I've been here over a month and it's driving me crazy". Patient had no stated goals for today.  A- Scheduled medications administered to patient, per MD orders. Support and encouragement provided.  Routine safety checks conducted every 15 minutes.  Patient informed to notify staff with problems or concerns.  R- No adverse drug reactions noted. Patient contracts for safety at this time. Patient compliant with medications and treatment plan. Patient receptive, calm, and cooperative. Patient remains safe at this time.  Problem: Education: Goal: Knowledge of [place] [facility] Education information/materials will improve Outcome: Progressing Goal: Emotional status will improve Outcome: Progressing Goal: Mental status will improve Outcome: Progressing Goal: Verbalization of understanding the information provided will improve Outcome: Progressing   Problem: Safety: Goal: Periods of time without injury will increase Outcome: Progressing   Problem: Education: Goal: Ability to state activities that reduce stress will improve Outcome: Progressing   Problem: Education: Goal: Knowledge of [place] [facility] Education information/materials will improve Outcome: Progressing Goal: Emotional status will improve Outcome: Progressing Goal: Mental status will improve Outcome: Progressing Goal: Verbalization of understanding the information provided will improve Outcome: Progressing   Problem: Activity: Goal: Interest or engagement in activities will improve Outcome: Progressing Goal: Sleeping patterns will improve Outcome: Progressing   Problem: Coping: Goal: Ability to verbalize frustrations  and anger appropriately will improve Outcome: Progressing Goal: Ability to demonstrate self-control will improve Outcome: Progressing   Problem: Health Behavior/Discharge Planning: Goal: Identification of resources available to assist in meeting health care needs will improve Outcome: Progressing Goal: Compliance with treatment plan for underlying cause of condition will improve Outcome: Progressing   Problem: Physical Regulation: Goal: Ability to maintain clinical measurements within normal limits will improve Outcome: Progressing   Problem: Safety: Goal: Periods of time without injury will increase Outcome: Progressing

## 2020-02-24 NOTE — Plan of Care (Signed)
  Problem: Education: Goal: Knowledge of [place] [facility] Education information/materials will improve Outcome: Not Progressing Goal: Emotional status will improve Outcome: Not Progressing Goal: Mental status will improve Outcome: Not Progressing Goal: Verbalization of understanding the information provided will improve Outcome: Not Progressing   Problem: Safety: Goal: Periods of time without injury will increase Outcome: Not Progressing   Problem: Education: Goal: Ability to state activities that reduce stress will improve Outcome: Not Progressing   Problem: Education: Goal: Knowledge of [place] [facility] Education information/materials will improve Outcome: Not Progressing Goal: Emotional status will improve Outcome: Not Progressing Goal: Mental status will improve Outcome: Not Progressing Goal: Verbalization of understanding the information provided will improve Outcome: Not Progressing   Problem: Activity: Goal: Interest or engagement in activities will improve Outcome: Not Progressing Goal: Sleeping patterns will improve Outcome: Not Progressing   Problem: Coping: Goal: Ability to verbalize frustrations and anger appropriately will improve Outcome: Not Progressing Goal: Ability to demonstrate self-control will improve Outcome: Not Progressing   Problem: Health Behavior/Discharge Planning: Goal: Identification of resources available to assist in meeting health care needs will improve Outcome: Not Progressing Goal: Compliance with treatment plan for underlying cause of condition will improve Outcome: Not Progressing   Problem: Physical Regulation: Goal: Ability to maintain clinical measurements within normal limits will improve Outcome: Not Progressing   Problem: Safety: Goal: Periods of time without injury will increase Outcome: Not Progressing

## 2020-02-24 NOTE — Progress Notes (Signed)
Patient refused his vital signs this morning.

## 2020-02-24 NOTE — Plan of Care (Signed)
  Problem: Education: Goal: Knowledge of [place] [facility] Education information/materials will improve Outcome: Progressing Goal: Emotional status will improve Outcome: Progressing Goal: Mental status will improve Outcome: Progressing Goal: Verbalization of understanding the information provided will improve Outcome: Progressing   Problem: Safety: Goal: Periods of time without injury will increase Outcome: Progressing   Problem: Education: Goal: Ability to state activities that reduce stress will improve Outcome: Progressing   Problem: Education: Goal: Knowledge of [place] [facility] Education information/materials will improve Outcome: Progressing Goal: Emotional status will improve Outcome: Progressing Goal: Mental status will improve Outcome: Progressing Goal: Verbalization of understanding the information provided will improve Outcome: Progressing   Problem: Activity: Goal: Interest or engagement in activities will improve Outcome: Progressing Goal: Sleeping patterns will improve Outcome: Progressing   Problem: Coping: Goal: Ability to verbalize frustrations and anger appropriately will improve Outcome: Progressing Goal: Ability to demonstrate self-control will improve Outcome: Progressing   Problem: Health Behavior/Discharge Planning: Goal: Identification of resources available to assist in meeting health care needs will improve Outcome: Progressing Goal: Compliance with treatment plan for underlying cause of condition will improve Outcome: Progressing   Problem: Physical Regulation: Goal: Ability to maintain clinical measurements within normal limits will improve Outcome: Progressing   Problem: Safety: Goal: Periods of time without injury will increase Outcome: Progressing

## 2020-02-24 NOTE — Progress Notes (Signed)
Patient at first refused his medication and ordered me to leave his room. Then he changed his mind after talking with Cleo, LPN, who used to be his Contractor, and he took his medications and apologized for his behavior. Not reporting any voices. Says he was embarrassed by his behavior.
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
## 2020-02-24 NOTE — BHH Group Notes (Signed)
[facility] Group Notes: (Clinical Social Work)   02/24/2020      Type of Therapy:  Group Therapy   Participation Level:  Did Not Attend - was invited individually by Nurse/MHT and chose not to attend.   Raina Mina, [place] 02/24/2020  2:52 PM

## 2020-02-24 NOTE — Progress Notes (Signed)
This writer rechecked patient's HR and it was 111. This Probation officer explained to patient that taking his Propanolol would help with his high HR. Patient has missed both doses today, however, he did state that he would take the medication tomorrow.

## 2020-02-24 NOTE — Progress Notes (Signed)
Patient's HR was in the 140s during evening med pass. Patient refused his scheduled Propanolol, but took everything else. Patient was encouraged to take the medication, to help lower his HR, however, he stated "I'm ok". This writer will recheck HR.

## 2020-02-24 NOTE — Progress Notes (Signed)
Patient came and got snack and took it to his room. He refused his HS medication however saying "I don't want it, get out" and would give no reason as to why he was refusing his medication and would answer no further questions

## 2020-02-25 NOTE — Progress Notes (Signed)
This Probation officer, along with patient, filled out his menu, to encourage him to eat.

## 2020-02-25 NOTE — BHH Counselor (Addendum)
CSW spoke with the patient's Care Coordinator, Theadora Rama, [phone].    CSW updated that at this time all group homes in all [place] counties have been contacted with little to no interest in patient.  CSW encouraged for additional support from guardian and LME.  CSW checked on status of getting the pt placed on the "Group Home Level High" list, as evidenced by a need following the patient not finding placement in all [place].  Theadora Rama reports that she will staff with her supervisor and follow up, though she doesn't think that it will happen, due to previous situations.  She reports that she will follow up with this CSW at a later date.    Assunta Curtis, MSW, LCSW 02/25/2020 4:06 PM

## 2020-02-25 NOTE — BHH Counselor (Signed)
CSW contacted the following in an effort to locate possible bed placement:  [place]ter [phone] rang twice then switched over to a fax  [place]- none there  [place]- none there  [place]- none there  [place]- none there  [place]- none there  [place] [facility]- none there  [place] [facility] [phone] rang but no one/voicemail ever/never picked up  [phone] Rosalin Hawking [phone] has one bed at a multiunit housing  [place]- none there  Phillip Heal- [facility] [phone] rang 7 times without answer The Imagene Sheller [phone] rang a number of times then went to a busy signal  [address]- none there  [place] #6 [phone] vm left [place] #2 [phone] rang 5 without answer Same [phone] see notes below [place] #3&4 [phone]  CSW spoke with Ryan Zuniga at [facility] [phone]) regarding potential placement. She informed CSW that she oversees admissions for all of the [employer]. She requested initial psych assessment, PSA, and the last week of progress notes from both the psychiatrist and day shift nurses for review. Ryan Zuniga requested that they be emailed [email or web address]). CSW agreed. Requested information was emailed along with copy of FL2.   Chalmers Guest. Guerry Bruin, MSW, [place], [place] 02/25/2020 4:09 PM

## 2020-02-25 NOTE — BHH Counselor (Signed)
CSW called to follow up on previous group homes contacted in effort to fine placement:  Kathlee Nations [phone] from With a [place] #2, "[facility]", [phone] CSW left HIPAA compliant voicemail.   [address] Pate-Director, [phone]) [phone]) CSW spoke with Loma Sousa who asked that the [facility] be faxed.    [place], [place], [place]   CSW left HIPAA compliant voicemail.     [facility], [phone] Asked that information be sent to [phone].  CSW spoke with Barrington Ellison.    Marinell Blight, [phone] CSW left HIPAA compliant voicemail.      Chrystie Nose, [facility], [phone] Declined the patient at this time, for concerns that staff cannot handle them.   Dellis Anes, [phone] CSW left HIPAA compliant voicemail.      Terri Skains, [phone] CSW unable to leave message as voicemail box is full.   [facility] [phone], [phone], [phone] [place] Patient declined due to not being a good fit.     [place], [phone] Line rang incessantly without the option of leaving a voicemail.    [place], [phone], Judeth Porch Asked that information be resent to [place] [facility], [phone], [place], [phone]. CSW received message that memory is full and that a remote access code needed to be entered.    These calls were not follow up but initial calls: [facility] No group homes identified. [place] No group homes identified.  [place], [place] CSW left message.  Review of the programs offered on their website, CSW is unsure if program is appropriate for the patient.   [facility] No group home identified.  [facility] No group homes identified.   [place] No group homes identified.    [facility] No group homes identified.   [place] No group homes identified.   [facility] No group home identified.  [facility] No group homes identified.   [facility] with [place], [place], [phone] Line rang several times then began to sound like a fax machine.  [place] No group homes identified.  [place] No group home identified.  [facility], [phone], CSW was asked to call [phone] ext [phone].  Fax [phone].  CSW spoke with [place] who reports that she will pass CSW contact information on to the co-owner and have them call this CSW back.  [place] No group home identified.   [place] No group home identified.   [facility] No group homes identified.  [place] Only home identified is an eating disorder clinic.  [facility] No group homes identified.  [place] [facility] is an [place].  [facility] No group homes identified.  [facility] No group homes identified.   [facility] No group homes identified.  [facility] No group home identified.   [facility] No group homes identified.   [facility] [place] CSW left HIPAA compliant voicemail [facility] No group homes available.    [facility] No group homes listed.   [facility] No group homes listed.   [facility] No group homes identified.   [place], [phone] CSW received message that number was not in working order.   [facility] No group homes listed.    [facility]  [place], [phone] CSW called multiple times, someone answered but as CSW began to identify herself, the person began to speak nonsensical statement and CSW terminated the call.    [place] [facility], [phone] Two male beds available.  However, working  with a Education officer, museum who is working to fill the beds. She reports that she will follow up that social worker and then call this CSW back.   [place] #1, [phone], QP-Mr. White [phone] CSW left HIPAA compliant voicemail.   [place] #3, [phone], Dr. Julien Girt [phone] Seven beds available for [employer] waiver. Asked that information be faxed to [place] of [facility], [phone] CSW called and was unable to speak with anyone.  Line rang incessantly.     [facility], [phone] CSW spoke with Anderson Malta who reports that she will call back once she has spoken with the group home owner.     [place] #2, [phone] Save information as the previous group home with the same name.   [facility], [phone] Line rang incessantly then message popped up to enter "remote access code".      Assunta Curtis, MSW, LCSW 02/25/2020 2:07 PM
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
## 2020-02-25 NOTE — [facility] Group Notes (Signed)
LCSW Group Therapy Note   02/25/2020 2:09 PM  Type of Therapy and Topic:  Group Therapy:  Overcoming Obstacles   Participation Level:  Did Not Attend   Description of Group:    In this group patients will be encouraged to explore what they see as obstacles to their own wellness and recovery. They will be guided to discuss their thoughts, feelings, and behaviors related to these obstacles. The group will process together ways to cope with barriers, with attention given to specific choices patients can make. Each patient will be challenged to identify changes they are motivated to make in order to overcome their obstacles. This group will be process-oriented, with patients participating in exploration of their own experiences as well as giving and receiving support and challenge from other group members.   Therapeutic Goals: 1. Patient will identify personal and current obstacles as they relate to admission. 2. Patient will identify barriers that currently interfere with their wellness or overcoming obstacles.  3. Patient will identify feelings, thought process and behaviors related to these barriers. 4. Patient will identify two changes they are willing to make to overcome these obstacles:      Summary of Patient Progress X    Therapeutic Modalities:   Cognitive Behavioral Therapy Solution Focused Therapy Motivational Interviewing Relapse Prevention Therapy  Hedy Camara R. Guerry Bruin, MSW, [place], [place] 02/25/2020 2:09 PM

## 2020-02-25 NOTE — Progress Notes (Signed)
This writer went to ask patient if he would take his scheduled Propanolol this evening and he stated to this writer that his heart "feels funny". This Probation officer asked patient if he would allow me to take his BP and get his HR. Patient stated "I feel fine". This Probation officer notified MD, and after talking with patient, he allowed this writer to obtain vitals on him. His BP and HR were slightly elevated, at 131/93 and 119, so he did agree to take medication from this Probation officer. Patient tolerated scheduled, evening medications well, without any issues.

## 2020-02-25 NOTE — Plan of Care (Signed)
D- Patient alert and oriented. Patient presents in a pleasant mood on assessment stating that he slept ok last night and had no complaints to voice to this writer at this time. Patient denies SI, HI, AVH, and pain at this time. Patient also denies any signs/symptoms of depression/anxiety, stating that he is feeling "good". Patient had no stated goals for today.  A- Scheduled medications administered to patient, per MD orders. Support and encouragement provided. Routine safety checks conducted every 15 minutes. Patient informed to notify staff with problems or concerns.  R- No adverse drug reactions noted. Patient contracts for safety at this time. Patient compliant with medications. Patient receptive, calm, and cooperative. Patient remains safe at this time.  Problem: Education: Goal: Knowledge of [place] [facility] Education information/materials will improve Outcome: Progressing Goal: Emotional status will improve Outcome: Progressing Goal: Mental status will improve Outcome: Progressing Goal: Verbalization of understanding the information provided will improve Outcome: Progressing   Problem: Safety: Goal: Periods of time without injury will increase Outcome: Progressing   Problem: Education: Goal: Ability to state activities that reduce stress will improve Outcome: Progressing   Problem: Education: Goal: Knowledge of [place] [facility] Education information/materials will improve Outcome: Progressing Goal: Emotional status will improve Outcome: Progressing Goal: Mental status will improve Outcome: Progressing Goal: Verbalization of understanding the information provided will improve Outcome: Progressing   Problem: Activity: Goal: Interest or engagement in activities will improve Outcome: Progressing Goal: Sleeping patterns will improve Outcome: Progressing   Problem: Coping: Goal: Ability to verbalize frustrations and anger appropriately will improve Outcome:  Progressing Goal: Ability to demonstrate self-control will improve Outcome: Progressing   Problem: Health Behavior/Discharge Planning: Goal: Identification of resources available to assist in meeting health care needs will improve Outcome: Progressing Goal: Compliance with treatment plan for underlying cause of condition will improve Outcome: Progressing   Problem: Physical Regulation: Goal: Ability to maintain clinical measurements within normal limits will improve Outcome: Progressing   Problem: Safety: Goal: Periods of time without injury will increase Outcome: Progressing

## 2020-02-25 NOTE — Plan of Care (Signed)
Patient presents at his baseline  Problem: Education: Goal: Emotional status will improve Outcome: Not Progressing Goal: Mental status will improve Outcome: Not Progressing   

## 2020-02-25 NOTE — Progress Notes (Signed)
Patient refused his Propanolol again this morning.

## 2020-02-25 NOTE — B[facility] Counselor (Signed)
CSW utilized the HUB to refer patient to ALFs.  Referrals have been sent and CSW will continue to monitor.   Assunta Curtis, MSW, LCSW 02/25/2020 9:55 AM

## 2020-02-25 NOTE — Progress Notes (Signed)
Patient calm and pleasant during assessment denying SI/HI/AVH. Patient isolative to his room this evening but did come out for snack and medications. Patient compliant with medication administration per MD orders. Patient given education, support, and encouragement to be active in his treatment plan. Patient being monitored Q 15 minutes for safety per unit protocol. Pt remains safe on the unit.

## 2020-02-26 NOTE — Tx Team (Signed)
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   Acute psychosis ([name])   Current Medications:  Current Facility-Administered Medications  Medication Dose Route Frequency Provider Last Rate Last Admin   acetaminophen (TYLENOL) tablet 650 mg  650 mg Oral Q6H PRN [name], NP       alum & mag hydroxide-simeth (MAALOX/MYLANTA) 200-200-20 MG/5ML suspension 30 mL  30 mL Oral Q4H PRN [name], [name], NP       amLODipine (NORVASC) tablet 5 mg  5 mg Oral Daily Salley Scarlet, MD   5 mg at 02/26/20 0865   ARIPiprazole (ABILIFY) tablet 10 mg  10 mg Oral QHS Salley Scarlet, MD   10 mg at 02/25/20 2114   doxepin (SINEQUAN) capsule 20 mg  20 mg Oral QHS [name], [name], NP   20 mg at 78/46/96 2952   folic acid (FOLVITE) tablet 1 mg  1 mg Oral Daily [name], [name], NP   1 mg at 02/26/20 8413   hydrOXYzine (ATARAX/VISTARIL) tablet 50 mg  50 mg Oral Q6H PRN [name], [name], MD   50 mg at 02/22/20 0836   lamoTRIgine (LAMICTAL) tablet 125 mg  125 mg Oral Daily Anette Riedel M, NP   125 mg at 02/26/20 2440   lithium carbonate capsule 150 mg  150 mg Oral BID WC [name], [name], NP   150 mg at 02/26/20 1027   magnesium hydroxide (MILK OF MAGNESIA) suspension 30 mL  30 mL Oral Daily PRN [name], NP       melatonin tablet 2.5 mg  2.5 mg Oral QHS [name], [name], NP   2.5 mg at 02/25/20 2115   mirtazapine (REMERON) tablet 30 mg  30 mg Oral QHS [name], [name], NP   30 mg at 02/25/20 2115   nicotine (NICODERM CQ - dosed in mg/24 hours) patch 21 mg  21 mg Transdermal Daily [name], [name] T, MD   21 mg at  02/25/20 0822   OLANZapine zydis (ZYPREXA) disintegrating tablet 10 mg  10 mg Oral TID PRN Rulon Sera, MD       Or   OLANZapine (ZYPREXA) injection 10 mg  10 mg Intramuscular TID PRN Rulon Sera, MD       propranolol (INDERAL) tablet 60 mg  60 mg Oral BID Salley Scarlet, MD   60 mg at 02/26/20 0805   trihexyphenidyl (ARTANE) tablet 2 mg  2 mg Oral BID WC [name], [name], NP   2 mg at 02/26/20 0808   PTA Medications: Medications Prior to Admission  Medication Sig Dispense Refill Last Dose   ARIPiprazole Lauroxil ER 1064 MG/3.9ML PRSY Inject into the muscle.      benztropine (COGENTIN) 2 MG tablet Take 2 mg by mouth 2 (two) times daily.      folic acid (FOLVITE) 1 MG tablet Take 1 mg by mouth daily.      LAMICTAL 100 MG tablet Take 100 mg by mouth daily.      Melatonin 3-10 MG TABS Take 1 tablet by mouth at bedtime.      mirtazapine (REMERON) 15 MG tablet Take 15 mg by mouth at bedtime.  Patient Stressors: Financial difficulties Medication change or noncompliance  Patient Strengths: Motivation for treatment/growth Religious Affiliation Supportive family/friends  Treatment Modalities: Medication Management, Group therapy, Case management,  1 to 1 session with clinician, Psychoeducation, Recreational therapy.   Physician Treatment Plan for Primary Diagnosis: Adjustment disorder with mixed disturbance of emotions and conduct Long Term Goal(s):     Short Term Goals:    Medication Management: Evaluate patient's response, side effects, and tolerance of medication regimen.  Therapeutic Interventions: 1 to 1 sessions, Unit Group sessions and Medication administration.  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  Therapeutic Interventions: 1 to 1 sessions, Unit Group sessions and Medication administration.  Evaluation of Outcomes: Progressing   RN Treatment Plan for Primary Diagnosis: Adjustment disorder with mixed disturbance of emotions and conduct Long Term Goal(s): Knowledge of disease and therapeutic regimen to maintain health will improve  Short Term Goals: Ability to demonstrate self-control, Ability to participate in decision making will improve, Ability to verbalize feelings will improve, Ability to identify and develop effective coping behaviors will improve and Compliance with prescribed medications will improve  Medication Management: RN will administer medications as ordered by provider, will assess and evaluate patient's response and provide education to patient for prescribed medication. RN will report any adverse and/or side effects to prescribing provider.  Therapeutic Interventions: 1 on 1 counseling sessions, Psychoeducation, Medication administration, Evaluate responses to treatment, Monitor vital signs and CBGs as ordered, Perform/monitor CIWA, COWS, AIMS and Fall Risk screenings as ordered, Perform wound care treatments as ordered.  Evaluation of Outcomes: Progressing   LCSW Treatment Plan for Primary Diagnosis: Adjustment disorder with mixed disturbance of emotions and conduct Long Term Goal(s): Safe transition to appropriate next level of care at discharge, Engage patient in therapeutic group addressing interpersonal concerns.  Short Term Goals: Engage patient in aftercare planning with referrals and resources, Increase social support, Increase ability to appropriately verbalize feelings, Increase emotional regulation and Increase skills for wellness and recovery  Therapeutic Interventions: Assess for all discharge needs, 1 to 1  time with Social worker, Explore available resources and support systems, Assess for adequacy in community support network, Educate family and significant other(s) on suicide prevention, Complete Psychosocial Assessment, Interpersonal group therapy.  Evaluation of Outcomes: Progressing   Progress in Treatment: Attending groups: No. Participating in groups: No. Taking medication as prescribed: Yes. Toleration medication: Yes. Family/Significant other contact made: Yes, individual(s) contacted:  [name], Legal Guardian  Patient understands diagnosis: Yes. Discussing patient identified problems/goals with staff: Yes. Medical problems stabilized or resolved: Yes. Denies suicidal/homicidal ideation: Yes. Issues/concerns per patient self-inventory: No. Other: N/A   New problem(s) identified: No, Describe:  None   New Short Term/Long Term Goal(s): Elimination of symptoms of psychosis, medication management for mood stabilization; development of comprehensive mental wellness plan. Update 01/17/2020: No changes at this time. Update 01/22/2020: No changes at this time.Update 01/28/2020: No changes at this time. Update 02/02/2020: No changes Update 02/07/2020:  No changes at this time. Update 02/12/2020: No changes at this time. Update 02/16/20: No changes at this time. Update 02/21/20: No changes at this time.  Update 02/26/2020: No changes  at this time.  Patient Goals: Patient stated that he would like to live somewhere else. Patient does not want to return to his group home. Update 01/17/2020: No changes at this time. Update 01/22/2020: No changes at this time.Update 01/28/2020: No changes at this time. Update 02/02/2020: No changes Update 02/07/2020:  No changes at this time. Update 02/12/20: No changes at this time. Update 02/16/20: No changes at this time. Update 02/21/20: No changes at this time.  Update 02/26/2020: No changes at this time.  Discharge Plan or Barriers:  CSW will  discuss appropriate plan for discharge. Update 01/17/2020: Patient is not able to return to his group home. CSW will assist his guardian as she looks for placement for the patient. Update 01/22/2020: Guardian reports that she has only attempted to contact 5 homes since July when patient was made aware that he would have to leave the home. CSW will to continue to assist the patient in identifying placement. Update 01/28/2020: Patient remains on unit pending placement. CSW will continue to search. Patient now has a Care Coordinator that is assisting. Per [place] will not pay for additional resources and only one Group Home high within their catchment area and that has a long wait list. Patient will remain on the unit until placement is found. Update 02/02/2020: CSW continues to look for placement options.  Update 02/07/2020:  CSW team and CMA continue to look for placement.  Pt's care coordinator also continues to assist with placement. Update 02/12/2020: No changes at this time. CSW team and CMA continue to look for placement. Update 02/16/20: No changes at this time. Update 02/21/20: No changes at this time. CSW team and CMA continue to look for placement. Referral process for [facility] initiated per request. Update 02/26/2020: CSW will continue to assist patient in developing an appropriate discharge plan.   Reason for Continuation of Hospitalization: Hallucinations Medication stabilization  Estimated Length of Stay: TBD   Attendees: Patient: 02/26/2020 9:30 AM  Physician: Selina Cooley, MD 02/26/2020 9:30 AM  Nursing: Graceann Congress, RN 02/26/2020 9:30 AM  RN Care Manager: 02/26/2020 9:30 AM  Social Worker: Assunta Curtis, MSW, LCSW 02/26/2020 9:30 AM  Recreational Therapist:  02/26/2020 9:30 AM  Other: Chalmers Guest. Guerry Bruin, MSW, LCSW, [place] 02/26/2020 9:30 AM  Other: Paulla Dolly, Latanya Presser, LCASA 02/26/2020 9:30 AM  Other: 02/26/2020 9:30 AM    Scribe for Treatment Team: Rozann Lesches, LCSW 02/26/2020 9:30 AM

## 2020-02-26 NOTE — BHH Counselor (Signed)
CSW contacted the patient's guardian, [phone], [name].  CSW updated guardian that entirety of [place] was called for placement with th exception of [facility], as she had reported that she was reaching out to [facility] and so far there have been no one interested in placement.  She reports that she contacted the following counties: [place], [place], [place], [place], [place] and had no one interested as well.    She reports "I don't know" when CSW asked what were the plans for the patient's next steps.  She reports that she will reach out to Mr. Humphries, a group home owner, who showed passive interest in the past.  She reports that she will staff the patient with her team on 02/27/2020 and follow up with this CSW.  Assunta Curtis, MSW, LCSW 02/26/2020 11:40 AM
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
## 2020-02-26 NOTE — BHH Counselor (Signed)
CSW spoke with the patient's ACTT team.  Per Barnett Applebaum with [facility], they have not identified any placement for the patient.  She reports that they have contacted the counties that [place] have requested "[place], [place], [place], [place] and [place] and at least two other counties".    She reports that she does not know what the next steps for the patient will be if placement is not found.  She expressed frustration that there only one group home level [place] in [place] area and that the waiting list for that home is 2 years long.  Assunta Curtis, MSW, LCSW 02/26/2020 12:40 PM

## 2020-02-26 NOTE — BHH Group Notes (Signed)

## 2020-02-26 NOTE — BHH Counselor (Signed)
CSW spoke with Tyra at [facility] in an effort to speak with the patient's ACTT team.  CSW left HIPAA compliant message requesting a return phone cal.  Assunta Curtis, MSW, LCSW 02/26/2020 11:51 AM

## 2020-02-26 NOTE — Plan of Care (Signed)
Patient presents at his baseline  Problem: Education: Goal: Emotional status will improve Outcome: Not Progressing Goal: Mental status will improve Outcome: Not Progressing   

## 2020-02-26 NOTE — Progress Notes (Signed)
Patient calm and pleasant during assessment denying SI/HI/AVH. Patient isolative to his room this evening but did come out for snack and medications. Patient compliant with medication administration per MD orders. Patient given education, support, and encouragement to be active in his treatment plan. Patient being monitored Q 15 minutes for safety per unit protocol. Pt remains safe on the unit.

## 2020-02-26 NOTE — Plan of Care (Signed)
Patient stated that he had a good day.Staff asked patient about cleaning his nails,patient refused states " I am good."Compliant with medications.Denies SI,HI and AVH.Appetite and energy level good.Did not attend groups.Support and encouragement given.

## 2020-02-26 NOTE — BHH Counselor (Signed)
CSW called [place] to follow up regarding potential placement. CSW was informed that Alexine was currently in a conference call and asked to call back later. CSW left name for follow up. CSW will continue to monitor/follow.   Chalmers Guest. Guerry Bruin, MSW, LCSW, [place] 02/26/2020 3:29 PM

## 2020-02-26 NOTE — Progress Notes (Signed)
Recreation Therapy Notes   Date: 02/26/2020  Time: 9:30 am   Location: Craft room     Behavioral response: N/A   Intervention Topic: Relaxation   Discussion/Intervention: Patient did not attend group.   Clinical Observations/Feedback:  Patient did not attend group.   Monquie Fulgham LRT/CTRS        Rutha Melgoza 02/26/2020 11:05 AM

## 2020-02-27 NOTE — BHH Counselor (Signed)
CSW spoke with pt's guardian to inform of Mr. Whites intended call. She reports that Ryan Zuniga has called and is requesting additional funding.  CSW asked if guardian will request for the additional funding from Care Coordinator, as this information was shared with her and not this CSW, this CSW was unaware that this was a possibility. Guardian's response was "I don't know."  Guardian did report that Ryan Zuniga is interested in the Ryan Zuniga and would like to interview the Ryan Zuniga. CSW received permission from [facility] Director for Ryan Zuniga to have an interview.  Assunta Curtis, MSW, LCSW 02/27/2020 1:42 PM

## 2020-02-27 NOTE — BHH Counselor (Signed)
CSW checked her voicemail and pt's care coordinator followed up on previous message that she would staff the patient with her supervisor.  Per Katharine Look her supervisor requested that CSW and team begin calling the [facility] for the patient.  Assunta Curtis, MSW, LCSW 02/27/2020 1:44 PM
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
## 2020-02-27 NOTE — BHH Counselor (Signed)
CSW phoned Alexine at [facility] [phone]) regarding potential placement and referral information sent over. Unable to make contact. HIPPA complaint voicemail left with contact information for follow up.  Chalmers Guest. Guerry Bruin, MSW, LCSW, [place] 02/27/2020 3:12 PM

## 2020-02-27 NOTE — Plan of Care (Signed)
Patient verbalized that he feeling good.Denies SI,HI and AVH.No irritable behaviors noted.Patient came to medication room to get his medicines.ADLs maintained.Appetite and energy level good.Support and encouragement given.

## 2020-02-27 NOTE — Progress Notes (Signed)
Recreation Therapy Notes  Date: 02/27/2020  Time: 9:30 am   Location: Craft room  Behavioral response: Appropriate  Intervention Topic: Leisure   Discussion/Intervention:  Group content today was focused on leisure. The group defined what leisure is and some positive leisure activities they participate in. Individuals identified the difference between good and bad leisure. Participants expressed how they feel after participating in the leisure of their choice. The group discussed how they go about picking a leisure activity and if others are involved in their leisure activities. The patient stated how many leisure activities they have to choose from and reasons why it is important to have leisure time. Individuals participated in the intervention "Exploration of Leisure" where they had a chance to identify new leisure activities as well as benefits of leisure.  Clinical Observations/Feedback: Patient came to group and was focused on the topic at hand. Individual was social with peers and staff while participating in the intervention.  Lehua Flores LRT/CTRS         Zakiyah Diop 02/27/2020 1:11 PM

## 2020-02-27 NOTE — BHH Counselor (Signed)
CSW called to follow up on information that was previously called:    Kathlee Nations [phone] from [facility], "[facility]", [phone] CSW left HIPAA compliant voicemail.    [address] [place]-Director, [phone]), [phone])  No male beds available in ALF.  Was provided the contact information for [facility], [phone] and [employer], [phone].   [place], [place], [place]   CSW spoke with [place] who reports that he has not reviewed the paperwork, however he call once he has reviewed.   [facility] [place], [phone] Voicemail box is full and unable to accept any messages.    [facility]in[facility] Blight, [phone] No longer have bed available.        Dellis Anes, [phone] CSW left HIPAA compliant voicemail.      Terri Skains, [phone] CSW unable to leave message as voicemail box is full.    [place], [phone], Gloris Ham reports that the home will not be able to "meet their needs".  Patient is declined at this time.    [facility], [phone] CSW left HIPAA compliant voicemail  [place], [place] CSW called multiple times, someone answered but as CSW began to identify herself, the person began to speak nonsensical statement and CSW terminated the call.    U[facility]ses Jarred, [phone] Two male beds available.  However, working with a Education officer, museum who is working to fill the beds. She reports that she will follow up that social worker and then call this CSW back.   [place] [facility], [phone], QP-Mr. White [place] [facility], [phone], Dr. Julien Girt [phone] Will return call this evening after paperwork has been reviewed.   Assunta Curtis, MSW, LCSW 02/27/2020 10:40 AM

## 2020-02-27 NOTE — BHH Counselor (Signed)
CSW called to schedule with Seward Carol, [phone].  CSW was informed that call would need to be returned in the morning as Mr. Ryan Zuniga has left for the day.   Assunta Curtis, MSW, LCSW 02/27/2020 2:58 PM

## 2020-02-27 NOTE — BHH Counselor (Signed)
CSW received a call from Su Hilt, with [place] [facility], [phone], [phone], [place] [facility], [phone], Dr. Julien Girt [phone].  He reports that patient may be a good fit, however, they would like to talk to his guardian.  CSW provided guardian's contact information.  CSW asked for a follow up phone call.  Assunta Curtis, MSW, LCSW 02/27/2020 1:07 PM

## 2020-02-27 NOTE — BHH Group Notes (Signed)
LCSW Group Therapy Note  02/27/2020 2:10 PM  Type of Therapy/Topic:  Group Therapy:  Emotion Regulation  Participation Level:  Did Not Attend   Description of Group:   The purpose of this group is to assist patients in learning to regulate negative emotions and experience positive emotions. Patients will be guided to discuss ways in which they have been vulnerable to their negative emotions. These vulnerabilities will be juxtaposed with experiences of positive emotions or situations, and patients will be challenged to use positive emotions to combat negative ones. Special emphasis will be placed on coping with negative emotions in conflict situations, and patients will process healthy conflict resolution skills.  Therapeutic Goals: 1. Patient will identify two positive emotions or experiences to reflect on in order to balance out negative emotions 2. Patient will label two or more emotions that they find the most difficult to experience 3. Patient will demonstrate positive conflict resolution skills through discussion and/or role plays  Summary of Patient Progress: X  Therapeutic Modalities:   Cognitive Behavioral Therapy Feelings Identification Dialectical Behavioral Therapy  Gustavus Haskin R. Guerry Bruin, MSW, LCSW, [place] 02/27/2020 2:10 PM

## 2020-02-28 NOTE — Progress Notes (Signed)
Patient stated to this writer that he was going to take his evening medication, if only this writer would bring it to him. Once this writer got to patient's room, he had changed his mind and stated that he would take it at bedtime. This Probation officer stated that it would be too late for him to take his medication. This writer wasted the medication in the Stericycle, in the medication room. Patient came up to the medication room and stated "I'll take it, I'm sorry, I'm losing my mind". This Probation officer administered patient's medication, and he tolerated it well.

## 2020-02-28 NOTE — Progress Notes (Signed)
Patient calm and pleasant during assessment denying SI/HI/AVH. Patient isolative to his room this evening but did come out for snack and medications. Patient compliant with medication administration per MD orders. Patient given education, support, and encouragement to be active in his treatment plan. Patient being monitored Q 15 minutes for safety per unit protocol. Pt remains safe on the unit.

## 2020-02-28 NOTE — Plan of Care (Signed)
  Problem: Safety: Goal: Periods of time without injury will increase Outcome: Progressing   Problem: Education: Goal: Mental status will improve Outcome: Not Progressing   Problem: Activity: Goal: Interest or engagement in activities will improve Outcome: Not Progressing   Problem: Health Behavior/Discharge Planning: Goal: Compliance with treatment plan for underlying cause of condition will improve Outcome: Not Progressing

## 2020-02-28 NOTE — Progress Notes (Signed)
Patient pleasant and cooperative. Isolative to self and room. Refused medications tonight, reports he does not want to take them tonight. Pt did come out of room later tonight to ask for snacks. Denies any SI, HI, AVH.  Continues to wait for placement. Encouragement and support provided. Safety checks maintained. Medications given as prescribed. Pt receptive and remains safe on unit with q 15 min checks.

## 2020-02-28 NOTE — Progress Notes (Signed)
Recreation Therapy Notes   Date: 02/28/2020  Time: 9:30 am   Location: Craft room     Behavioral response: N/A   Intervention Topic: Communication   Discussion/Intervention: Patient did not attend group.   Clinical Observations/Feedback:  Patient did not attend group.   Lucca Greggs LRT/CTRS         Tyjae Shvartsman 02/28/2020 11:02 AM

## 2020-02-28 NOTE — Plan of Care (Signed)
D- Patient alert and oriented. Patient presented in a pleasant mood on assessment stating that he slept "so-so" last night, and had no complaints to voice to this Probation officer. Although patient denied depression and anxiety to this writer, he did rate them a "3/10" on his self-inventory. Patient reported to this writer that overall, he is feeling "good". Patient also denied SI, HI, AVH, and pain at this time. Patient had no stated goals for today.  A- Scheduled medications administered to patient, per MD orders. Support and encouragement provided.  Routine safety checks conducted every 15 minutes.  Patient informed to notify staff with problems or concerns.  R- No adverse drug reactions noted. Patient contracts for safety at this time. Patient compliant with medications. Patient receptive, calm, and cooperative. Patient interacts well with others on the unit.  Patient remains safe at this time.  Problem: Education: Goal: Knowledge of [place] General Education information/materials will improve Outcome: Progressing Goal: Emotional status will improve Outcome: Progressing Goal: Mental status will improve Outcome: Progressing Goal: Verbalization of understanding the information provided will improve Outcome: Progressing   Problem: Safety: Goal: Periods of time without injury will increase Outcome: Progressing   Problem: Education: Goal: Ability to state activities that reduce stress will improve Outcome: Progressing   Problem: Education: Goal: Knowledge of [place] General Education information/materials will improve Outcome: Progressing Goal: Emotional status will improve Outcome: Progressing Goal: Mental status will improve Outcome: Progressing Goal: Verbalization of understanding the information provided will improve Outcome: Progressing   Problem: Activity: Goal: Interest or engagement in activities will improve Outcome: Progressing Goal: Sleeping patterns will  improve Outcome: Progressing   Problem: Coping: Goal: Ability to verbalize frustrations and anger appropriately will improve Outcome: Progressing Goal: Ability to demonstrate self-control will improve Outcome: Progressing   Problem: Health Behavior/Discharge Planning: Goal: Identification of resources available to assist in meeting health care needs will improve Outcome: Progressing Goal: Compliance with treatment plan for underlying cause of condition will improve Outcome: Progressing   Problem: Physical Regulation: Goal: Ability to maintain clinical measurements within normal limits will improve Outcome: Progressing   Problem: Safety: Goal: Periods of time without injury will increase Outcome: Progressing
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
## 2020-02-28 NOTE — [facility] Group Notes (Signed)
LCSW Group Therapy Note  02/28/2020 2:11 PM  Type of Therapy/Topic:  Group Therapy:  Balance in Life  Participation Level:  Did Not Attend  Description of Group:    This group will address the concept of balance and how it feels and looks when one is unbalanced. Patients will be encouraged to process areas in their lives that are out of balance and identify reasons for remaining unbalanced. Facilitators will guide patients in utilizing problem-solving interventions to address and correct the stressor making their life unbalanced. Understanding and applying boundaries will be explored and addressed for obtaining and maintaining a balanced life. Patients will be encouraged to explore ways to assertively make their unbalanced needs known to significant others in their lives, using other group members and facilitator for support and feedback.  Therapeutic Goals: 1. Patient will identify two or more emotions or situations they have that consume much of in their lives. 2. Patient will identify signs/triggers that life has become out of balance:  3. Patient will identify two ways to set boundaries in order to achieve balance in their lives:  4. Patient will demonstrate ability to communicate their needs through discussion and/or role plays  Summary of Patient Progress: X  Therapeutic Modalities:   Cognitive Behavioral Therapy Solution-Focused Therapy Assertiveness Training  Assunta Curtis MSW, LCSW 02/28/2020 2:11 PM

## 2020-02-28 NOTE — BHH Counselor (Signed)
CSW scheduled the patient for a interview with Seward Carol for 02/29/2020 at [place], MSW, LCSW 02/28/2020 11:28 AM

## 2020-02-28 NOTE — Plan of Care (Signed)
Patient presents at his baseline  Problem: Education: Goal: Emotional status will improve Outcome: Not Progressing Goal: Mental status will improve Outcome: Not Progressing   

## 2020-02-28 NOTE — BHH Counselor (Signed)
CSW spoke with the patient's Care Coordinator Theadora Rama with [facility].  She reports that following her staffing the patient with her supervisor it was recommended that team begin contacting [facility].  CSW asked if [facility] were not considered a step down and she replied "I thought so too but my supervisor said they are like the same thing".   CSW followed up on request for group home level high and she replied "he doesn't meet the criteria for high and [facility] is not going to go for it".  CSW requested that a formal request be done so that all effort is attempted to best support this patient.  After requesting a formal request of an application for group home high she replied "I don't know what that looks like but I can tell you he is going to be denied."  CSW again requested a formal request.    Katharine Look reports that she has begun calling the family care homes in [place] and [place] and will call [facility] in the next week.   Assunta Curtis, MSW, LCSW 02/28/2020 4:16 PM

## 2020-02-29 LAB — SARS CORONAVIRUS 2 BY RT PCR (HOSPITAL ORDER, PERFORMED IN ~~LOC~~ HOSPITAL LAB): SARS Coronavirus 2: NEGATIVE

## 2020-02-29 MED ORDER — TUBERCULIN PPD 5 UNIT/0.1ML ID SOLN
5.0000 [IU] | Freq: Once | INTRADERMAL | Status: AC
  Administered 2020-02-29: 5 [IU] via INTRADERMAL
  Filled 2020-02-29: qty 0.1

## 2020-02-29 NOTE — Progress Notes (Signed)
D: Pt alert and oriented. Pt denies experiencing any anxiety/depression at this time. Pt denies experiencing any pain at this time. Pt denies experiencing any SI/HI, or AVH at this time.   A: Scheduled medications administered to pt, per MD orders. Support and encouragement provided. Frequent verbal contact made. Routine safety checks conducted q15 minutes.   R: No adverse drug reactions noted. Pt verbally contracts for safety at this time. Pt complaint with medications. Pt interacts well with others on the unit. Pt remains safe at this time. Will continue to monitor.
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
## 2020-02-29 NOTE — BHH Counselor (Signed)
Patient assisted patient in completing his interview with Seward Carol.  Patient did well in the interview and asked appropriate questions.   Assunta Curtis, MSW, LCSW 02/29/2020 11:49 AM

## 2020-03-01 DIAGNOSIS — F4325 Adjustment disorder with mixed disturbance of emotions and conduct: Secondary | ICD-10-CM | POA: Diagnosis not present

## 2020-03-01 NOTE — Progress Notes (Signed)
Pt is alert and oriented to person, place, time and situation. Pt is calm, avoids eye contact, labile mood, initially agreed to come take his medication, then when he walked into the med room, pt said, "I changed my mind" and walked out without offering an explanation, MD/psychiatrist notified. Pt also refused to eat breakfast and lunch. For dinner, pt said he would eat but then changed his mind when another pt walked past pt, pt appears paranoid to eat around peers, then pt asked to eat in his room. Pt does not wish to explain the details/reasons why he feels paranoid to eat around peers. Pt denies suicidal and homicidal ideation, denies hallucinations, denies feelings of depression and anxiety. Will continue to monitor pt per Q15 minute face checks and monitor for safety and progress.
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
## 2020-03-01 NOTE — Plan of Care (Signed)
  Problem: Education: Goal: Knowledge of [facility] Education information/materials will improve Outcome: Progressing Goal: Emotional status will improve Outcome: Progressing Goal: Mental status will improve Outcome: Progressing Goal: Verbalization of understanding the information provided will improve Outcome: Progressing   Problem: Safety: Goal: Periods of time without injury will increase Outcome: Progressing   Problem: Education: Goal: Ability to state activities that reduce stress will improve Outcome: Progressing   Problem: Education: Goal: Knowledge of [facility] Education information/materials will improve Outcome: Progressing Goal: Emotional status will improve Outcome: Progressing Goal: Mental status will improve Outcome: Progressing Goal: Verbalization of understanding the information provided will improve Outcome: Progressing   Problem: Activity: Goal: Interest or engagement in activities will improve Outcome: Progressing Goal: Sleeping patterns will improve Outcome: Progressing   Problem: Coping: Goal: Ability to verbalize frustrations and anger appropriately will improve Outcome: Progressing Goal: Ability to demonstrate self-control will improve Outcome: Progressing   Problem: Health Behavior/Discharge Planning: Goal: Identification of resources available to assist in meeting health care needs will improve Outcome: Progressing Goal: Compliance with treatment plan for underlying cause of condition will improve Outcome: Progressing   Problem: Physical Regulation: Goal: Ability to maintain clinical measurements within normal limits will improve Outcome: Progressing   Problem: Safety: Goal: Periods of time without injury will increase Outcome: Progressing

## 2020-03-01 NOTE — Progress Notes (Signed)
Patient has remained isolative to room. Hygiene is improved. Says he is looking forward to discharge on Tuesday. Denies SI, HI and AVH.

## 2020-03-01 NOTE — [facility] Group Notes (Signed)
[facility] Group Notes: (Clinical Social Work)   03/01/2020      Type of Therapy:  Group Therapy   Participation Level:  Did Not Attend - was invited individually by Nurse/MHT and chose not to attend.   Raina Mina, LCSWA 03/01/2020  3:01 PM

## 2020-03-01 NOTE — Tx Team (Signed)
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 acetaminophen (TYLENOL) tablet 650 mg  650 mg Oral Q6H PRN [name], [name], NP      . alum & mag hydroxide-simeth (MAALOX/MYLANTA) 200-200-20 MG/5ML suspension 30 mL  30 mL Oral Q4H PRN [name], [name], NP      . amLODipine (NORVASC) tablet 5 mg  5 mg Oral Daily Salley Scarlet, MD   5 mg at 02/29/20 0750  . ARIPiprazole (ABILIFY) tablet 10 mg  10 mg Oral QHS Salley Scarlet, MD   10 mg at 02/29/20 2140  . doxepin (SINEQUAN) capsule 20 mg  20 mg Oral QHS [name], [name], NP   20 mg at 02/29/20 2139  . folic acid (FOLVITE) tablet 1 mg  1 mg Oral Daily [name], [name], NP   1 mg at 02/29/20 0751  . hydrOXYzine (ATARAX/VISTARIL) tablet 50 mg  50 mg Oral Q6H PRN [name], [name], MD   50 mg at 02/29/20 0926  . lamoTRIgine (LAMICTAL) tablet 125 mg  125 mg Oral Daily Anette Riedel M, NP   125 mg at 02/29/20 0750  . lithium carbonate capsule 150 mg  150 mg Oral BID WC [name], [name], NP   150 mg at 02/29/20 1703  . magnesium hydroxide (MILK OF MAGNESIA) suspension 30 mL  30 mL Oral Daily PRN [name], [name], NP      . melatonin tablet 2.5 mg  2.5 mg Oral QHS [name], [name], NP   2.5 mg at 02/29/20 2139  . mirtazapine (REMERON) tablet 30 mg  30 mg Oral QHS [name], [name], NP   30 mg at 02/29/20 2140  . nicotine (NICODERM CQ - dosed in mg/24 hours) patch 21 mg  21 mg Transdermal Daily [name], [name], MD   21 mg at  02/29/20 0755  . OLANZapine zydis (ZYPREXA) disintegrating tablet 10 mg  10 mg Oral TID PRN Rulon Sera, MD       Or  . OLANZapine (ZYPREXA) injection 10 mg  10 mg Intramuscular TID PRN Rulon Sera, MD      . propranolol (INDERAL) tablet 60 mg  60 mg Oral BID Salley Scarlet, MD   60 mg at 02/29/20 1703  . trihexyphenidyl (ARTANE) tablet 2 mg  2 mg Oral BID WC [name], [name], NP   2 mg at 02/29/20 1704  . tuberculin injection 5 Units  5 Units Intradermal Once Salley Scarlet, MD   5 Units at 02/29/20 1525   PTA Medications: Medications Prior to Admission  Medication Sig Dispense Refill Last Dose  . ARIPiprazole Lauroxil ER 1064 MG/3.9ML PRSY Inject into the muscle.     . benztropine (COGENTIN) 2 MG tablet Take 2 mg by mouth 2 (two) times daily.     . folic acid (FOLVITE) 1 MG tablet Take 1 mg by mouth daily.     Marland Kitchen LAMICTAL 100 MG tablet Take 100 mg by mouth daily.     . Melatonin 3-10 MG TABS Take 1 tablet by mouth at bedtime.     Marland Kitchen  mirtazapine (REMERON) 15 MG tablet Take 15 mg by mouth at bedtime.       Patient Stressors: Financial difficulties Medication change or noncompliance  Patient Strengths: Motivation for treatment/growth Religious Affiliation Supportive family/friends  Treatment Modalities: Medication Management, Group therapy, Case management,  1 to 1 session with clinician, Psychoeducation, Recreational therapy.   Physician Treatment Plan for Primary Diagnosis: Adjustment disorder with mixed disturbance of emotions and conduct Long Term Goal(s):     Short Term Goals:    Medication Management: Evaluate patient's response, side effects, and tolerance of medication regimen.  Therapeutic Interventions: 1 to 1 sessions, Unit Group sessions and Medication administration.  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  Therapeutic Interventions: 1 to 1 sessions, Unit Group sessions and Medication administration.  Evaluation of Outcomes: Progressing   RN Treatment Plan for Primary Diagnosis: Adjustment disorder with mixed disturbance of emotions and conduct Long Term Goal(s): Knowledge of disease and therapeutic regimen to maintain health will improve  Short Term Goals: Ability to demonstrate self-control, Ability to participate in decision making will improve, Ability to verbalize feelings will improve, Ability to identify and develop effective coping behaviors will improve and Compliance with prescribed medications will improve  Medication Management: RN will administer medications as ordered by provider, will assess and evaluate patient's response and provide education to patient for prescribed medication. RN will report any adverse and/or side effects to prescribing provider.  Therapeutic Interventions: 1 on 1 counseling sessions, Psychoeducation, Medication administration, Evaluate responses to treatment, Monitor vital signs and CBGs as ordered, Perform/monitor CIWA, COWS, AIMS and Fall Risk screenings as ordered, Perform wound care treatments as ordered.  Evaluation of Outcomes: Progressing   LCSW Treatment Plan for Primary Diagnosis: Adjustment disorder with mixed disturbance of emotions and conduct Long Term Goal(s): Safe transition to appropriate next level of care at discharge, Engage patient in therapeutic group addressing interpersonal concerns.  Short Term Goals: Engage patient in aftercare planning with referrals and resources, Increase social support, Increase ability to appropriately verbalize feelings, Increase emotional regulation and  Increase skills for wellness and recovery  Therapeutic Interventions: Assess for all discharge needs, 1 to 1 time with Social worker, Explore available resources and support systems, Assess for adequacy in community support network, Educate family and significant other(s) on suicide prevention, Complete Psychosocial Assessment, Interpersonal group therapy.  Evaluation of Outcomes: Progressing   Progress in Treatment: Attending groups: No. Participating in groups: No. Taking medication as prescribed: Yes. Toleration medication: Yes. Family/Significant other contact made: Yes, individual(s) contacted:  [name], Legal Guardian  Patient understands diagnosis: Yes. Discussing patient identified problems/goals with staff: Yes. Medical problems stabilized or resolved: Yes. Denies suicidal/homicidal ideation: Yes. Issues/concerns per patient self-inventory: No. Other: N/a   New problem(s) identified: No, Describe:  None   New Short Term/Long Term Goal(s): Elimination of symptoms of psychosis, medication management for mood stabilization; development of comprehensive mental wellness plan. Update 01/17/2020: No changes at this time. Update 01/22/2020: No changes at this time.Update 01/28/2020: No changes at this time. Update 02/02/2020: No changes Update 02/07/2020: No changes at this time.Update 02/12/2020: No changes at this time. Update 02/16/20: No  changes at this time. Update 02/21/20: No changes at this time.  Update 02/26/2020: No changes at this time.  Update 03/01/2020: No changes at this time   Patient Goals: Patient stated that he would like to live somewhere else. Patient does not want to return to his group home. Update 01/17/2020: No changes at this time. Update 01/22/2020: No changes at this time.Update 01/28/2020: No changes at this time. Update 02/02/2020:No changes Update 02/07/2020: No changes at this time.Update 02/12/20: No changes at this time.Update 02/16/20:  No changes at this time. Update 02/21/20: No changes at this time.  Update 02/26/2020: No changes at this time. Update 03/01/2020: No changes at this time   Discharge Plan or Barriers: CSW will discuss appropriate plan for discharge. Update 01/17/2020: Patient is not able to return to his group home. CSW will assist his guardian as she looks for placement for the patient. Update 01/22/2020: Guardian reports that she has only attempted to contact 5 homes since July when patient was made aware that he would have to leave the home. CSW will to continue to assist the patient in identifying placement. Update 01/28/2020: Patient remains on unit pending placement. CSW will continue to search. Patient now has a Care Coordinator that is assisting. Per [facility] will not pay for additional resources and only one Group Home high within their catchment area and that has a long wait list. Patient will remain on the unit until placement is found.Update 02/02/2020: CSW continues to look for placement options. Update 02/07/2020: CSW team and CMA continue to look for placement. Pt's care coordinator also continues to assist with placement. Update 02/12/2020: No changes at this time. CSW team and CMA continue to look for placement.Update 02/16/20: No changes at this time. Update 02/21/20: No changes at this time. CSW team and CMA continue to look for placement. Referral process for [facility] initiated per request. Update 02/26/2020: CSW will continue to assist patient in developing an appropriate discharge plan. Update 03/01/2020: Patient has an anticipated discharge date for 03/04/20 to a new group home.    Reason for Continuation of Hospitalization: Hallucinations Medication stabilization  Estimated Length of Stay: TBD  Attendees: Patient: 03/01/2020 11:22 AM  Physician: Dr. Danella Sensing, MD  03/01/2020 11:22 AM  Nursing:  03/01/2020 11:22 AM  RN Care Manager: 03/01/2020 11:22 AM  Social Worker: Raina Mina, [place] 03/01/2020 11:22 AM  Recreational Therapist:  03/01/2020 11:22 AM  Other:  03/01/2020 11:22 AM  Other:  03/01/2020 11:22 AM  Other: 03/01/2020 11:22 AM    Scribe for Treatment Team: Raina Mina, LCSWA 03/01/2020 11:22 AM

## 2020-03-02 DIAGNOSIS — F4325 Adjustment disorder with mixed disturbance of emotions and conduct: Secondary | ICD-10-CM | POA: Diagnosis not present

## 2020-03-02 NOTE — Plan of Care (Signed)
  Problem: Education: Goal: Knowledge of [facility] Education information/materials will improve Outcome: Progressing Goal: Emotional status will improve Outcome: Progressing Goal: Mental status will improve Outcome: Progressing Goal: Verbalization of understanding the information provided will improve Outcome: Progressing   Problem: Safety: Goal: Periods of time without injury will increase Outcome: Progressing   Problem: Education: Goal: Ability to state activities that reduce stress will improve Outcome: Progressing   Problem: Education: Goal: Knowledge of [facility] Education information/materials will improve Outcome: Progressing Goal: Emotional status will improve Outcome: Progressing Goal: Mental status will improve Outcome: Progressing Goal: Verbalization of understanding the information provided will improve Outcome: Progressing   Problem: Activity: Goal: Interest or engagement in activities will improve Outcome: Progressing Goal: Sleeping patterns will improve Outcome: Progressing   Problem: Coping: Goal: Ability to verbalize frustrations and anger appropriately will improve Outcome: Progressing Goal: Ability to demonstrate self-control will improve Outcome: Progressing   Problem: Health Behavior/Discharge Planning: Goal: Identification of resources available to assist in meeting health care needs will improve Outcome: Progressing Goal: Compliance with treatment plan for underlying cause of condition will improve Outcome: Progressing   Problem: Physical Regulation: Goal: Ability to maintain clinical measurements within normal limits will improve Outcome: Progressing   Problem: Safety: Goal: Periods of time without injury will increase Outcome: Progressing

## 2020-03-02 NOTE — Progress Notes (Signed)
Patient has been cooperative with medication. Still isolating in his room and requesting food and medication to be brought to his room. Denies SI, HI and AVH

## 2020-03-02 NOTE — Progress Notes (Signed)
Pt is alert and oriented to person, place, time and situation. Pt is calm, cooperative, denies suicidal and homicidal ideation, denies hallucinations, denies depression and anxiety, reports his mood is "good." Pt is also medication compliant. Pt sat and listen to music staff played for pt. Pt did not attend social work group, instead spends time resting quietly in his room. No distress noted, none reported. Will continue to monitor pt per Q15 minute face checks and monitor for safety and progress.

## 2020-03-02 NOTE — [facility] Group Notes (Signed)
[facility] Group Notes: (Clinical Social Work)   03/02/2020      Type of Therapy:  Group Therapy   Participation Level:  Did Not Attend - was invited individually by Nurse/MHT and chose not to attend.   Raina Mina, [place] 03/02/2020  3:18 PM

## 2020-03-02 NOTE — Progress Notes (Signed)
PPD read today, MD/psychiatist Dr. Larita Fife notified regarding results, see worklist for details. Induration = 46mm, no new orders obtained.
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
## 2020-03-03 MED ORDER — AMLODIPINE BESYLATE 5 MG PO TABS
5.0000 mg | ORAL_TABLET | Freq: Every day | ORAL | 0 refills | Status: AC
Start: 2020-03-04 — End: ?

## 2020-03-03 MED ORDER — BENZTROPINE MESYLATE 2 MG PO TABS: 2.0000 mg | ORAL_TABLET | Freq: Two times a day (BID) | ORAL | 0 refills | Status: DC

## 2020-03-03 MED ORDER — MELATONIN 3-10 MG PO TABS: 1.0000 | ORAL_TABLET | Freq: Every day | ORAL | 0 refills | Status: AC

## 2020-03-03 MED ORDER — FOLIC ACID 1 MG PO TABS
1.0000 mg | ORAL_TABLET | Freq: Every day | ORAL | 0 refills | Status: DC
Start: 2020-03-03 — End: 2022-02-01

## 2020-03-03 MED ORDER — ARIPIPRAZOLE 10 MG PO TABS
10.0000 mg | ORAL_TABLET | Freq: Every day | ORAL | 0 refills | Status: DC
Start: 2020-03-03 — End: 2020-03-03

## 2020-03-03 MED ORDER — LITHIUM CARBONATE 150 MG PO CAPS: 150.0000 mg | ORAL_CAPSULE | Freq: Two times a day (BID) | ORAL | 0 refills | Status: AC

## 2020-03-03 MED ORDER — DOXEPIN HCL 10 MG PO CAPS: 20.0000 mg | ORAL_CAPSULE | Freq: Every day | ORAL | 0 refills | Status: DC

## 2020-03-03 MED ORDER — PROPRANOLOL HCL 60 MG PO TABS
60.0000 mg | ORAL_TABLET | Freq: Two times a day (BID) | ORAL | 0 refills | Status: AC
Start: 2020-03-03 — End: ?

## 2020-03-03 MED ORDER — LAMICTAL 100 MG PO TABS: 100.0000 mg | ORAL_TABLET | Freq: Every day | ORAL | 0 refills | Status: DC

## 2020-03-03 MED ORDER — HYDROXYZINE HCL 50 MG PO TABS
50.0000 mg | ORAL_TABLET | Freq: Four times a day (QID) | ORAL | 0 refills | Status: AC | PRN
Start: 2020-03-03 — End: ?

## 2020-03-03 MED ORDER — OLANZAPINE 10 MG PO TBDP
10.0000 mg | ORAL_TABLET | Freq: Three times a day (TID) | ORAL | 0 refills | Status: DC | PRN
Start: 2020-03-03 — End: 2022-02-01

## 2020-03-03 MED ORDER — ARIPIPRAZOLE 10 MG PO TABS: 10.0000 mg | ORAL_TABLET | Freq: Every day | ORAL | 0 refills | Status: AC

## 2020-03-03 MED ORDER — MIRTAZAPINE 30 MG PO TABS: 30.0000 mg | ORAL_TABLET | Freq: Every day | ORAL | 0 refills | Status: AC
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
## 2020-03-03 NOTE — Progress Notes (Deleted)
ppd

## 2020-03-03 NOTE — [facility] Counselor (Signed)
CSW faxed requested information to [facility]  CSW received confirmation the fax was successful.  Assunta Curtis, MSW, LCSW 03/03/2020 11:26 AM

## 2020-03-03 NOTE — [facility] Group Notes (Signed)
LCSW Group Therapy Note   03/03/2020 12:40 PM  Type of Therapy and Topic:  Group Therapy:  Overcoming Obstacles   Participation Level:  Did Not Attend   Description of Group:    In this group patients will be encouraged to explore what they see as obstacles to their own wellness and recovery. They will be guided to discuss their thoughts, feelings, and behaviors related to these obstacles. The group will process together ways to cope with barriers, with attention given to specific choices patients can make. Each patient will be challenged to identify changes they are motivated to make in order to overcome their obstacles. This group will be process-oriented, with patients participating in exploration of their own experiences as well as giving and receiving support and challenge from other group members.   Therapeutic Goals: 1. Patient will identify personal and current obstacles as they relate to admission. 2. Patient will identify barriers that currently interfere with their wellness or overcoming obstacles.  3. Patient will identify feelings, thought process and behaviors related to these barriers. 4. Patient will identify two changes they are willing to make to overcome these obstacles:      Summary of Patient Progress X   Therapeutic Modalities:   Cognitive Behavioral Therapy Solution Focused Therapy Motivational Interviewing Relapse Prevention Therapy  Assunta Curtis, MSW, LCSW 03/03/2020 12:40 PM

## 2020-03-03 NOTE — Progress Notes (Signed)
PPD read today, MD/psychiatist Dr. Larita Fife notified regarding results, see worklist for details. Induration = 58mm, no new orders obtained.
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
## 2020-03-03 NOTE — Plan of Care (Signed)
  Problem: Group Participation Goal: STG - Patient will engage in groups without prompting or encouragement from LRT x3 group sessions within 5 recreation therapy group sessions Description: STG - Patient will engage in groups without prompting or encouragement from LRT x3 group sessions within 5 recreation therapy group sessions Outcome: Not Progressing   

## 2020-03-03 NOTE — BHH Suicide Risk Assessment (Signed)
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 rate 16, height 5\' 9"  (1.753 m), weight 72.6 kg, SpO2 100 %.Body mass index is 23.63 kg/m.  General Appearance: Casual  Eye Contact::  Good  Speech:  Clear and [id number]  Volume:  Normal  Mood:  Euthymic  Affect:  Congruent  Thought Process:  Coherent and Linear  Orientation:  Full (Time, Place, and Person)  Thought Content:  Logical  Suicidal Thoughts:  No  Homicidal Thoughts:  No  Memory:  Immediate;   Fair Recent;   Fair  Judgement:  Fair  Insight:  Fair  Psychomotor Activity:  Normal  Concentration:  Fair  Recall:  [employer] of Knowledge:Fair  Language: Fair  Akathisia:  Negative  Handed:  Right  AIMS (if indicated):     Assets:  Communication Skills Desire for Improvement Financial Resources/Insurance Housing Leisure Time Physical Health Resilience Social Support  Sleep:  Number of Hours: 8.25  Cognition: WNL  ADL's:  Intact   Mental Status Per Nursing Assessment::   On Admission:  NA  Demographic Factors:  Male and Caucasian  Loss Factors: NA  Historical Factors: Impulsivity  Risk Reduction Factors:   Sense of responsibility to family, Religious beliefs about death, Living with another person, especially a relative, Positive social support, Positive therapeutic relationship and Positive coping skills or problem solving  skills  Continued Clinical Symptoms:  Previous Psychiatric Diagnoses and Treatments  Cognitive Features That Contribute To Risk:  None    Suicide Risk:  Minimal: No identifiable suicidal ideation.  Patients presenting with no risk factors but with morbid ruminations; may be classified as minimal risk based on the severity of the depressive symptoms    Plan Of Care/Follow-up recommendations:  Activity:  as tolerated Diet:  regular diet  Salley Scarlet, MD 03/04/2020, 8:20 AM

## 2020-03-03 NOTE — BHH Counselor (Addendum)
CSW attempted to reach the patient's guardian.  CSW left HIPAA compliant voicemail.  CSW spoke with Care Coordinator, Theadora Rama, [phone].  CSW updated on patient's discharge.  Assunta Curtis, MSW, LCSW 03/03/2020 3:10 PM

## 2020-03-03 NOTE — Plan of Care (Signed)
  Problem: Education: Goal: Knowledge of [place] [facility] Education information/materials will improve Outcome: Progressing Goal: Emotional status will improve Outcome: Progressing Goal: Mental status will improve Outcome: Progressing Goal: Verbalization of understanding the information provided will improve Outcome: Progressing   Problem: Safety: Goal: Periods of time without injury will increase Outcome: Progressing   Problem: Education: Goal: Ability to state activities that reduce stress will improve Outcome: Progressing   Problem: Education: Goal: Knowledge of [place] [facility] Education information/materials will improve Outcome: Progressing Goal: Emotional status will improve Outcome: Progressing Goal: Mental status will improve Outcome: Progressing Goal: Verbalization of understanding the information provided will improve Outcome: Progressing   Problem: Activity: Goal: Interest or engagement in activities will improve Outcome: Progressing Goal: Sleeping patterns will improve Outcome: Progressing   Problem: Coping: Goal: Ability to verbalize frustrations and anger appropriately will improve Outcome: Progressing Goal: Ability to demonstrate self-control will improve Outcome: Progressing   Problem: Health Behavior/Discharge Planning: Goal: Identification of resources available to assist in meeting health care needs will improve Outcome: Progressing Goal: Compliance with treatment plan for underlying cause of condition will improve Outcome: Progressing   Problem: Physical Regulation: Goal: Ability to maintain clinical measurements within normal limits will improve Outcome: Progressing   Problem: Safety: Goal: Periods of time without injury will increase Outcome: Progressing

## 2020-03-03 NOTE — Progress Notes (Signed)
Patient has remained in his room. Med compliant. Denies SI, HI and AVH

## 2020-03-03 NOTE — Plan of Care (Signed)
Patient is excited about discharge tomorrow states " I will have freedom and I can smoke." Patient denies SI,HI and AVH.Patient first agreed to cut his nails but refusing to do at this time.Compliant with medications.Support and encouragement given.

## 2020-03-03 NOTE — Progress Notes (Signed)
Recreation Therapy Notes  Date: 03/03/2020  Time: 9:30 am   Location: Craft room     Behavioral response: N/A   Intervention Topic: Stress Management    Discussion/Intervention: Patient did not attend group.   Clinical Observations/Feedback:  Patient did not attend group.   Jazlynne Milliner LRT/CTRS        Shawndell Schillaci 03/03/2020 1:34 PM

## 2020-03-03 NOTE — Progress Notes (Signed)
   03/03/20 2126  Psych Admission Type (Psych Patients Only)  Admission Status Involuntary  Psychosocial Assessment  Patient Complaints None  Eye Contact Fair  Facial Expression Flat  Affect Appropriate to circumstance  Speech Logical/coherent  Interaction Assertive  Motor Activity Slow  Appearance/Hygiene Unremarkable  Behavior Characteristics Cooperative;Appropriate to situation  Mood Pleasant  Thought Process  Coherency WDL  Content WDL  Delusions None reported or observed  Perception WDL  Hallucination None reported or observed  Judgment Limited  Confusion None  Danger to Self  Current suicidal ideation? Denies  Danger to Others  Danger to Others None reported or observed  Danger to Others Abnormal  Harmful Behavior to others No threats or harm toward other people  D: Patient presents with pleasant affect and expresses excitement about discharge. Patient denies SI/HI at this time Patient also denies AH/VH at this time.  A: Provided positive reinforcement and encouragement.  R: Patient cooperative and receptive to efforts. Patient remains safe on the unit.

## 2020-03-04 NOTE — Progress Notes (Signed)
°  [facility] Adult Case Management Discharge Plan :  Will you be returning to the same living situation after discharge:  No.  Patient going to a new group home.  At discharge, do you have transportation home?: Yes,  Group Home is providing transportation. Do you have the ability to pay for your medications: Yes,  [facility].  Release of information consent forms completed and in the chart;  Patient's signature needed at discharge.  Patient to Follow up at:  Follow-up Information    [facility] Follow up.   Why: Your ACTT team is aware of your discharge.  They will follow up.  Thanks! Contact information: [place] [address] [phone]               Next level of care provider has access to [place] and Suicide Prevention discussed: Yes,  SPE completed with guardian.  Have you used any form of tobacco in the last 30 days? (Cigarettes, Smokeless Tobacco, Cigars, and/or Pipes): Yes  Has patient been referred to the Quitline?: Patient refused referral  Patient has been referred for addiction treatment: Pt. refused referral  Rozann Lesches, LCSW 03/04/2020, 8:56 AM

## 2020-03-04 NOTE — BHH Counselor (Signed)
Address to the home the patient will going to is [place], [place].   Assunta Curtis, MSW, LCSW 03/04/2020 9:54 AM

## 2020-03-04 NOTE — Progress Notes (Signed)
Recreation Therapy Notes  INPATIENT RECREATION TR PLAN  Patient Details Name: Ryan Zuniga MRN: [id number] DOB: [birth year] Today's Date: 03/04/2020  Rec Therapy Plan Is patient appropriate for Therapeutic Recreation?: Yes Treatment times per week: at least 3 Estimated Length of Stay: 5-7 days TR Treatment/Interventions: Group participation (Comment)  Discharge Criteria Pt will be discharged from therapy if:: Discharged Treatment plan/goals/alternatives discussed and agreed upon by:: Patient/family  Discharge Summary Short term goals set: Patient will engage in groups without prompting or encouragement from LRT x3 group sessions within 5 recreation therapy group sessions Short term goals met: Adequate for discharge Progress toward goals comments: Groups attended Which groups?: AAA/T, Other (Comment), Leisure education ([place]appiness) Reason goals not met: N/A Therapeutic equipment acquired: N/A Reason patient discharged from therapy: Discharge from hospital Pt/family agrees with progress & goals achieved: Yes Date patient discharged from therapy: 03/04/20   Blake Vetrano 03/04/2020, 1:02 PM

## 2020-03-04 NOTE — Progress Notes (Signed)
Pt discharged at [place]; escorted by pt's social worker and MHT to his ride that it taking him to the group home. Provided pt with discharge instructions, which include pt's discharge medication, medication education, follow up information, and prescriptions sent electronically to pt's pharmacy. All personal belongings returned to pt upon discharge. Pt denies suicidal and homicidal ideation, denies hallucinations, denies feelings of depression and anxiety, reports mood is "good." Pt is cooperative with discharge process, no distress noted, none reported.

## 2020-03-04 NOTE — BHH Counselor (Signed)
CSW sent information to the guardian per her request.  CSW received confirmation it was successful.  Assunta Curtis, MSW, LCSW 03/04/2020 10:35 AM

## 2021-02-11 ENCOUNTER — Other Ambulatory Visit: Payer: Self-pay

## 2021-02-11 ENCOUNTER — Emergency Department (HOSPITAL_COMMUNITY)
Admission: EM | Admit: 2021-02-11 | Discharge: 2021-02-14 | Disposition: A | Discharge status: Home / Self Care | Payer: Medicaid Other | Attending: Emergency Medicine | Admitting: Emergency Medicine

## 2021-02-11 ENCOUNTER — Encounter (HOSPITAL_COMMUNITY): Payer: Self-pay

## 2021-02-11 DIAGNOSIS — F17210 Nicotine dependence, cigarettes, uncomplicated: Secondary | ICD-10-CM | POA: Insufficient documentation

## 2021-02-11 DIAGNOSIS — F809 Developmental disorder of speech and language, unspecified: Secondary | ICD-10-CM

## 2021-02-11 DIAGNOSIS — Z85528 Personal history of other malignant neoplasm of kidney: Secondary | ICD-10-CM | POA: Insufficient documentation

## 2021-02-11 DIAGNOSIS — F4325 Adjustment disorder with mixed disturbance of emotions and conduct: Secondary | ICD-10-CM | POA: Insufficient documentation

## 2021-02-11 DIAGNOSIS — F250 Schizoaffective disorder, bipolar type: Secondary | ICD-10-CM | POA: Diagnosis present

## 2021-02-11 DIAGNOSIS — Z79899 Other long term (current) drug therapy: Secondary | ICD-10-CM | POA: Diagnosis not present

## 2021-02-11 DIAGNOSIS — Q860 Fetal alcohol syndrome (dysmorphic): Secondary | ICD-10-CM | POA: Diagnosis present

## 2021-02-11 DIAGNOSIS — R45851 Suicidal ideations: Secondary | ICD-10-CM | POA: Insufficient documentation

## 2021-02-11 DIAGNOSIS — F849 Pervasive developmental disorder, unspecified: Secondary | ICD-10-CM

## 2021-02-11 DIAGNOSIS — R45850 Homicidal ideations: Secondary | ICD-10-CM | POA: Insufficient documentation

## 2021-02-11 LAB — CBC WITH DIFFERENTIAL/PLATELET
Abs Immature Granulocytes: 0.03 10*3/uL (ref 0.00–0.07)
Basophils Absolute: 0 10*3/uL (ref 0.0–0.1)
Basophils Relative: 1 %
Eosinophils Absolute: 0.3 10*3/uL (ref 0.0–0.5)
Eosinophils Relative: 3 %
HCT: 49.9 % (ref 39.0–52.0)
Hemoglobin: 17 g/dL (ref 13.0–17.0)
Immature Granulocytes: 0 %
Lymphocytes Relative: 24 %
Lymphs Abs: 1.8 10*3/uL (ref 0.7–4.0)
MCH: 35.9 pg — ABNORMAL HIGH (ref 26.0–34.0)
MCHC: 34.1 g/dL (ref 30.0–36.0)
MCV: 105.5 fL — ABNORMAL HIGH (ref 80.0–100.0)
Monocytes Absolute: 0.6 10*3/uL (ref 0.1–1.0)
Monocytes Relative: 8 %
Neutro Abs: 4.9 10*3/uL (ref 1.7–7.7)
Neutrophils Relative %: 64 %
Platelets: 188 10*3/uL (ref 150–400)
RBC: 4.73 MIL/uL (ref 4.22–5.81)
RDW: 12.6 % (ref 11.5–15.5)
WBC: 7.6 10*3/uL (ref 4.0–10.5)
nRBC: 0 % (ref 0.0–0.2)

## 2021-02-11 LAB — COMPREHENSIVE METABOLIC PANEL
ALT: 16 U/L (ref 0–44)
AST: 15 U/L (ref 15–41)
Albumin: 4.5 g/dL (ref 3.5–5.0)
Alkaline Phosphatase: 52 U/L (ref 38–126)
Anion gap: 5 (ref 5–15)
BUN: 8 mg/dL (ref 6–20)
CO2: 28 mmol/L (ref 22–32)
Calcium: 9.6 mg/dL (ref 8.9–10.3)
Chloride: 104 mmol/L (ref 98–111)
Creatinine, Ser: 1.14 mg/dL (ref 0.61–1.24)
GFR, Estimated: >60 mL/min (ref >60)
Glucose, Bld: 90 mg/dL (ref 70–99)
Potassium: 3.9 mmol/L (ref 3.5–5.1)
Sodium: 137 mmol/L (ref 135–145)
Total Bilirubin: 0.5 mg/dL (ref 0.3–1.2)
Total Protein: 7.1 g/dL (ref 6.5–8.1)

## 2021-02-11 LAB — RAPID URINE DRUG SCREEN, HOSP PERFORMED
Amphetamines: NOT DETECTED
Barbiturates: NOT DETECTED
Benzodiazepines: NOT DETECTED
Cocaine: NOT DETECTED
Opiates: NOT DETECTED
Tetrahydrocannabinol: NOT DETECTED

## 2021-02-11 LAB — ACETAMINOPHEN LEVEL: Acetaminophen (Tylenol), Serum: <10 ug/mL — ABNORMAL LOW (ref 10–30)

## 2021-02-11 LAB — ETHANOL: Alcohol, Ethyl (B): <10 mg/dL (ref <10)

## 2021-02-11 LAB — SALICYLATE LEVEL: Salicylate Lvl: <7 mg/dL — ABNORMAL LOW (ref 7.0–30.0)
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
## 2021-02-11 NOTE — ED Notes (Signed)
[employer] = [employer] returned call. Please call [phone] for updates.
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
## 2021-02-11 NOTE — ED Notes (Signed)
Pt changed into purple scrubs and his clothes and personal items are bagged in locker room.

## 2021-02-11 NOTE — ED Notes (Signed)
Patient received call from patients social worker stating the group home patient resides in will not take the patient back until his medications have been reviewed due to his aggressive behavior.

## 2021-02-12 MED ORDER — PROPRANOLOL HCL 20 MG PO TABS
60.0000 mg | ORAL_TABLET | Freq: Two times a day (BID) | ORAL | Status: DC
  Administered 2021-02-12 – 2021-02-13 (×3): 60 mg via ORAL
  Filled 2021-02-12 (×6): qty 3
  Filled 2021-02-12: qty 6
  Filled 2021-02-12: qty 3
  Filled 2021-02-12 (×2): qty 6
  Filled 2021-02-12: qty 3

## 2021-02-12 MED ORDER — BENZTROPINE MESYLATE 1 MG PO TABS
2.0000 mg | ORAL_TABLET | Freq: Two times a day (BID) | ORAL | Status: DC
  Administered 2021-02-12 – 2021-02-13 (×4): 2 mg via ORAL
  Filled 2021-02-12 (×5): qty 2

## 2021-02-12 MED ORDER — ARIPIPRAZOLE 5 MG PO TABS
10.0000 mg | ORAL_TABLET | Freq: Every day | ORAL | Status: DC
  Administered 2021-02-12 – 2021-02-13 (×2): 10 mg via ORAL
  Filled 2021-02-12 (×2): qty 2

## 2021-02-12 MED ORDER — HYDROXYZINE HCL 25 MG PO TABS: 50.0000 mg | ORAL_TABLET | Freq: Four times a day (QID) | ORAL | Status: DC | PRN

## 2021-02-12 MED ORDER — MELATONIN 3 MG PO TABS
3.0000 mg | ORAL_TABLET | Freq: Every day | ORAL | Status: DC
  Administered 2021-02-12 – 2021-02-13 (×2): 3 mg via ORAL
  Filled 2021-02-12 (×2): qty 1

## 2021-02-12 MED ORDER — MIRTAZAPINE 15 MG PO TABS
30.0000 mg | ORAL_TABLET | Freq: Every day | ORAL | Status: DC
  Administered 2021-02-12 – 2021-02-13 (×2): 30 mg via ORAL
  Filled 2021-02-12 (×2): qty 2

## 2021-02-12 MED ORDER — LITHIUM CARBONATE 150 MG PO CAPS
150.0000 mg | ORAL_CAPSULE | Freq: Two times a day (BID) | ORAL | Status: DC
  Administered 2021-02-12 – 2021-02-14 (×4): 150 mg via ORAL
  Filled 2021-02-12 (×4): qty 1

## 2021-02-12 MED ORDER — LAMOTRIGINE 25 MG PO TABS
100.0000 mg | ORAL_TABLET | Freq: Every day | ORAL | Status: DC
  Administered 2021-02-12 – 2021-02-13 (×2): 100 mg via ORAL
  Filled 2021-02-12 (×3): qty 4

## 2021-02-12 MED ORDER — AMLODIPINE BESYLATE 5 MG PO TABS
5.0000 mg | ORAL_TABLET | Freq: Every day | ORAL | Status: DC
  Administered 2021-02-12 – 2021-02-13 (×2): 5 mg via ORAL
  Filled 2021-02-12 (×3): qty 1

## 2021-02-12 MED ORDER — PROPRANOLOL HCL 10 MG PO TABS: 60.0000 mg | ORAL_TABLET | Freq: Two times a day (BID) | ORAL | Status: DC

## 2021-02-12 MED ORDER — DOXEPIN HCL 10 MG PO CAPS
20.0000 mg | ORAL_CAPSULE | Freq: Every day | ORAL | Status: DC
  Administered 2021-02-12 – 2021-02-13 (×2): 20 mg via ORAL
  Filled 2021-02-12 (×2): qty 2

## 2021-02-12 NOTE — ED Notes (Signed)
Pt breakfast was placed at bedside

## 2021-02-12 NOTE — ED Notes (Signed)
Menu had not been set up for this patient  [employer] menu was placed and dietary called.

## 2021-02-12 NOTE — ED Notes (Signed)
Pt went to bathroom

## 2021-02-12 NOTE — ED Notes (Signed)
Pt lunch at bedside 

## 2021-02-12 NOTE — ED Provider Notes (Signed)
Emergency Medicine Observation Re-evaluation Note  Jamesmichael Shadd is a 40 y.o. male, seen on rounds today.  Pt initially presented to the ED for complaints of pt with history psychiatric illness as well as chronic, recurrent behavioral symptoms - ?worsened this week with aggressive behavior earlier - now calm, alert, nad.   Physical Exam  BP 123/83 (BP Location: Right Arm)   Pulse (!) 104   Temp 98.6 F (37 C)   Resp 18   Ht 1.727 m (5\' 8" )   Wt 72.6 kg   SpO2 93%   BMI 24.33 kg/m  Physical Exam General: alert, content.  Cardiac: regular rate Lungs: breathing comfortably Psych: calm, alert. Normal mood/affect. Pt not currently exhibiting agitated or aggressive behavior. Pt does not appear to be responding to internal stimuli. No SI/HI expressed.   ED Course / MDM    I have reviewed the labs performed to date as well as medications administered while in observation.  Recent changes in the last 24 hours include ED observation and reassessment.   Plan   Keoki Mchargue is not under involuntary commitment.  On chart review - it appears patient on no meds during his prior 24 hours in ED. Will restart home meds.   Patient was also sent to ED from group home with their goal of having meds assessed and possible med adjustment by [facility] - will ask TTS to re-consult, specifically to address that medication concern/opportunity.   Pt currently appears c/w baseline.  BH re-evaluation remains pending.      Lajean Saver, MD 02/12/21 [phone]

## 2021-02-12 NOTE — BH Assessment (Signed)
Comprehensive Clinical Assessment (CCA) Note  02/12/2021 Kanaan Kagawa [id number]  Disposition: Trinna Post, PA, patient meets inpatient criteria. Disposition SW to secure placement.   The patient demonstrates the following risk factors for suicide: Chronic risk factors for suicide include: psychiatric disorder of adjustment disorder . Acute risk factors for suicide include: N/A. Protective factors for this patient include: coping skills. Considering these factors, the overall suicide risk at this point appears to be high. Patient is not appropriate for outpatient follow up.  [place] ED from 02/11/2021 in [place] Admission (Discharged) from 01/11/2020 in [place] ED from 10/29/2019 in [place] RISK CATEGORY High Risk Error: Q3, 4, or 5 should not be populated when Q2 is No High Risk      Duard Spiewak is a 40 year old male presenting voluntary to [facility] due to auditory hallucinations to harm self and other people. Patient admitted to command voices telling him to buy a gun and kill himself and other people. Patient feels that he should act on this. Patient reported no specific individual. Patient reported onset, "long time ago, worsened in last months. Patient reported worsening depressive symptoms. Patient resides at [facility] and has been there for approx 1 year. Patient reported being getting along with others. Patient denied history of suicide attempts and self-harming behaviors. Patient denied access to guns. During assessment patient spoke with slow garbled speech. TTS clinician unable to assess at times. Patient was calm and cooperative during assessment.   Per RN note: Patient received call from patients social worker stating the group home patient resides in will not take the patient back until his medications have been reviewed due to his aggressive  behavior.   Collateral contact: Legal guardian, Axel Filler, [phone] Unable to reach at this time, will attempt at later time.  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[place])  CCA Biopsychosocial Patient Reported Schizophrenia/Schizoaffective Diagnosis in Past: No data recorded  Strengths: self-awareness  Mental Health Symptoms Depression:   Hopelessness; Tearfulness; Worthlessness; Fatigue; Change in energy/activity   Duration of Depressive symptoms:  Duration of Depressive Symptoms: Greater than two weeks   Mania:   None   Anxiety:    Worrying; Tension; Restlessness   Psychosis:   None   Duration of Psychotic symptoms:    Trauma:   None   Obsessions:   None   Compulsions:   None   Inattention:   None   Hyperactivity/Impulsivity:   None   Oppositional/Defiant Behaviors:   None   Emotional Irregularity:   None   Other Mood/Personality Symptoms:  No data recorded   Mental Status Exam Appearance and self-care  Stature:   Average   Weight:   Average weight   Clothing:  No data recorded  Grooming:   Normal   Cosmetic use:   None   Posture/gait:   Normal   Motor activity:   Not Remarkable   Sensorium  Attention:   Normal   Concentration:  No data recorded  Orientation:   X5   Recall/memory:   Normal   Affect and Mood  Affect:   Appropriate; Depressed   Mood:   Depressed; Hopeless; Worthless   Relating  Eye contact:   Normal   Facial expression:   Depressed; Sad   Attitude toward examiner:  Cooperative   Thought and Language  Speech flow:  Garbled; Soft; Slow   Thought content:   Appropriate to Mood and Circumstances   Preoccupation:   None   Hallucinations:   None   Organization:  No data recorded  [employer] of Knowledge:   Average   Intelligence:   Average   Abstraction:   Normal   Judgement:   Fair   Art therapist:  No data recorded  Insight:   Fair   Decision Making:   Confused   Social Functioning  Social Maturity:   Responsible   Social Judgement:  No data recorded  Stress  Stressors:   Housing; Relationship   Coping Ability:   Overwhelmed; Exhausted; Deficient supports   Skill Deficits:   Decision making; Activities of daily living   Supports:   Family    Religion:   Leisure/Recreation: Leisure / [place]?: Yes Leisure and Hobbies: listening to music and watching movies  Exercise/Diet: Exercise/Diet Do You Exercise?:  (uta) Have You Gained or Lost A Significant Amount of Weight in the Past Six Months?:  (uta) Do You Follow a Special Diet?:  (uta) Do You Have Any Trouble Sleeping?: No  CCA Employment/Education Employment/Work Situation: Employment / Work Situation Employment Situation: On disability Why is Patient on Disability: uta How Long has Patient Been on Disability: uta Patient's Job has Been Impacted by Current Illness: No Has Patient ever Been in the [employer]?: No  Education: Education Is Patient Currently Attending School?: No Last Grade Completed:  [employer]) Did You Have An Individualized Education Program (IIEP):  [employer]) Did You Have Any Difficulty At School?:  [employer]) Patient's Education Has Been Impacted by Current Illness:  (uta)  CCA Family/Childhood History Family and Relationship History: Family history Marital status: Single Does patient have children?: No  Childhood History:  Childhood History By whom was/is the patient raised?: Mother Did patient suffer any verbal/emotional/physical/sexual abuse as a child?: No Has patient ever been sexually abused/assaulted/raped as an adolescent or adult?: No Witnessed domestic violence?: No Has patient been affected by domestic violence as an adult?:  No  Child/Adolescent Assessment:   CCA Substance Use Alcohol/Drug Use: Alcohol / Drug Use Pain Medications: see MAR Prescriptions: see MAR Over the Counter: see MAR History of alcohol / drug use?: No history of alcohol / drug abuse Longest period of sobriety (when/how long): Unknown Negative Consequences of Use: Personal relationships Withdrawal Symptoms:  (N/A)   ASAM's:  Six Dimensions of Multidimensional Assessment  Dimension 1:  Acute Intoxication and/or Withdrawal Potential:      Dimension 2:  Biomedical Conditions and Complications:      Dimension 3:  Emotional, Behavioral, or Cognitive Conditions and Complications:     Dimension 4:  Readiness to Change:     Dimension 5:  Relapse, Continued use, or Continued Problem Potential:     Dimension 6:  Recovery/Living Environment:     ASAM Severity Score:    ASAM Recommended Level of Treatment:     Substance use Disorder (SUD)   Recommendations for Services/Supports/Treatments: Recommendations for Services/Supports/Treatments Recommendations For Services/Supports/Treatments: Individual Therapy, Medication Management, Inpatient Hospitalization  Discharge Disposition:   DSM5 Diagnoses: Patient Active Problem List   Diagnosis Date Noted   Ulcerative esophagitis 09/23/2016   Closed disp fracture of right medial malleolus with routine healing 05/27/2016   Cannabis abuse 12/30/2015   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   Agitation 07/31/2015   Schizoaffective disorder, bipolar type ([place])  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   Tobacco use disorder 03/31/2015   Borderline personality disorder ([place]) 03/26/2015   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

## 2021-02-12 NOTE — Progress Notes (Signed)
Patient has been denied by [facility] due to no appropriate beds available. Patient meets [facility] inpatient criteria per Trinna Post, PA. Patient has been faxed out to the following facilities:   [facility]  [address] [place] [address] [phone] [place]  [address] [place] [address] [phone]  [place]  [address] [place] [address] [phone]  [place]  [address] [place] [address] [phone]  [facility]  [place], [place] [address] [phone]  [facility]  [address], [place] [address] [address] [place] [address] [place]  [facility]  [address] [place] [address] [phone]  [facility]  [address], [place] [address] [phone] [place], MSW, LCSW-A  9:59 AM 02/12/2021
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
## 2021-02-13 NOTE — ED Notes (Addendum)
Pt moved from hall to room 17. Pt is cooperative and pleasant at this time.

## 2021-02-13 NOTE — Progress Notes (Signed)
Per Leevy-Johnson NP pt has been psych cleared. This CSW will remove from [facility] shift report. TOC SW has been consulted and will assist and follow pt with discharge needs.   Benjaman Kindler, MSW, [facility] 02/13/2021 10:31 PM

## 2021-02-13 NOTE — ED Notes (Signed)
Pt calm and cooperative at this time. Pt ambulated to restroom and provided snack and beverage at this time.

## 2021-02-13 NOTE — Progress Notes (Signed)
Patient has been faxed out under the recommendation of Dr. Serafina Mitchell. Patient meets [facility] inpatient criteria per Trinna Post, PA. Patient has been faxed out to the following facilities:   [facility]  [address] [place] [address] [phone] [place]  [address] [place] [address] [phone]  [facility]  [address], [place] [address] [phone]  [place]  [address] [place] [address] [phone]  [facility]  [place], [place] [address] [phone]  [facility]  [address] [place], [place] [address] [address] [place] [address] [place]  [facility]  [address], [place] [address] [phone]  [facility]  [address] [place] [address] [phone] [place], MSW, LCSW-A  9:36 AM 02/13/2021

## 2021-02-13 NOTE — Progress Notes (Signed)
CSW spoke with Ryan Zuniga pt's guardian and informed her pt is up for d/c . She reported pt's current group home is [facility] [phone]). CSW attempted to contact group home to inform them about pt's d/c no response left HIPPA compliant VM. Cassandra from guardianship services stated they may be out of the home, to keep trying. TOC continue to follow.  Arlie Solomons.Zorah Backes, MSW, [place]  Transitions of Care Clinical Social Worker I Direct Dial: [phone]  Fax: [phone] [email or web address]

## 2021-02-14 NOTE — ED Notes (Signed)
Pt refused vital signs.

## 2021-02-14 NOTE — ED Notes (Signed)
Pt refused to sign for d/c, pt refused vitals

## 2021-02-14 NOTE — ED Notes (Signed)
Pts facility called to report they are coming to pick the pt up today. MD made aware

## 2021-02-14 NOTE — ED Notes (Signed)
First nurse attempted to give pt scheduled meds. Pt stated he did not want them and poured them out on the keyboard. This nurse went to pt tapped him on the arm and asked why he did not want to take his medications. Pt became very agressive and jumped off the stretcher yelling " bitch I told you I didn't want to take my medicine". This nurse stepped away from the pt and gave him his space. Pt laid back down in bed. Security and [employer] at bedside.

## 2021-02-14 NOTE — Progress Notes (Signed)
CSW attempted to call [facility] [phone]). No answer left VM. CSW will contact LE for welfare check on the group home.  Arlie Solomons.Hayli Milligan, MSW, [place]  Transitions of Care Clinical Social Worker I Direct Dial: [phone]  Fax: [phone] [email or web address]

## 2021-02-14 NOTE — ED Provider Notes (Signed)
Emergency Medicine Observation Re-evaluation Note  Ryan Zuniga is a 40 y.o. male, seen on rounds today.  Pt initially presented to the ED for complaints of V70.1 Currently, the patient is resting comfortably, asking when lunch will be served .  Physical Exam  BP 118/68 (BP Location: Right Arm)   Pulse 65   Temp 98.7 F (37.1 C)   Resp 17   Ht 5\' 8"  (1.727 m)   Wt 72.6 kg   SpO2 95%   BMI 24.33 kg/m  Physical Exam General: well appearing, no acute distress Cardiac: regular rate Lungs: breathing comfortably, equal chest rise Psych: normal mood/affect, does not appear to be responding to internal stimuli, cooperative with care  ED Course / MDM  EKG:EKG Interpretation  Date/Time:  Wednesday February 11 2021 15:26:01 EDT Ventricular Rate:  56 PR Interval:  136 QRS Duration: 96 QT Interval:  394 QTC Calculation: 380 R Axis:   94 Text Interpretation: Sinus bradycardia Rightward axis Borderline ECG No acute changes No significant change since last tracing Confirmed by Varney Biles [id number]) on 02/12/2021 7:37:54 PM  I have reviewed the labs performed to date as well as medications administered while in observation.  Recent changes in the last 24 hours include; home medications re-ordered, cleared by [facility] for discharge back to group home. Received callback this morning, they will come to pick up the patient.   Plan  Current plan is for discharge to group home .  Ryan Zuniga is not under involuntary commitment.   Ryan Zuniga 02/14/21    Ryan Sparrow, DO 02/14/21 1226

## 2021-07-08 ENCOUNTER — Emergency Department
Admission: EM | Admit: 2021-07-08 | Discharge: 2022-02-01 | Disposition: A | Discharge status: Nursing Home (Includes ICF) | Payer: Medicaid Other | Attending: Emergency Medicine | Admitting: Emergency Medicine

## 2021-07-08 ENCOUNTER — Other Ambulatory Visit: Payer: Self-pay

## 2021-07-08 DIAGNOSIS — F79 Unspecified intellectual disabilities: Secondary | ICD-10-CM | POA: Diagnosis not present

## 2021-07-08 DIAGNOSIS — F603 Borderline personality disorder: Secondary | ICD-10-CM | POA: Insufficient documentation

## 2021-07-08 DIAGNOSIS — Q860 Fetal alcohol syndrome (dysmorphic): Secondary | ICD-10-CM | POA: Diagnosis not present

## 2021-07-08 DIAGNOSIS — Z1152 Encounter for screening for COVID-19: Secondary | ICD-10-CM | POA: Diagnosis not present

## 2021-07-08 DIAGNOSIS — F4325 Adjustment disorder with mixed disturbance of emotions and conduct: Secondary | ICD-10-CM | POA: Insufficient documentation

## 2021-07-08 DIAGNOSIS — F1010 Alcohol abuse, uncomplicated: Secondary | ICD-10-CM | POA: Insufficient documentation

## 2021-07-08 DIAGNOSIS — F250 Schizoaffective disorder, bipolar type: Secondary | ICD-10-CM | POA: Diagnosis not present

## 2021-07-08 DIAGNOSIS — Z046 Encounter for general psychiatric examination, requested by authority: Secondary | ICD-10-CM | POA: Diagnosis present

## 2021-07-08 DIAGNOSIS — F17210 Nicotine dependence, cigarettes, uncomplicated: Secondary | ICD-10-CM | POA: Diagnosis not present

## 2021-07-08 DIAGNOSIS — F259 Schizoaffective disorder, unspecified: Secondary | ICD-10-CM

## 2021-07-08 DIAGNOSIS — F17200 Nicotine dependence, unspecified, uncomplicated: Secondary | ICD-10-CM | POA: Diagnosis present

## 2021-07-08 DIAGNOSIS — F809 Developmental disorder of speech and language, unspecified: Secondary | ICD-10-CM

## 2021-07-08 LAB — COMPREHENSIVE METABOLIC PANEL
ALT: 14 U/L (ref 0–44)
AST: 19 U/L (ref 15–41)
Albumin: 4.5 g/dL (ref 3.5–5.0)
Alkaline Phosphatase: 66 U/L (ref 38–126)
Anion gap: 7 (ref 5–15)
BUN: 10 mg/dL (ref 6–20)
CO2: 27 mmol/L (ref 22–32)
Calcium: 9.7 mg/dL (ref 8.9–10.3)
Chloride: 103 mmol/L (ref 98–111)
Creatinine, Ser: 0.97 mg/dL (ref 0.61–1.24)
GFR, Estimated: >60 mL/min (ref >60)
Glucose, Bld: 134 mg/dL — ABNORMAL HIGH (ref 70–99)
Potassium: 3.7 mmol/L (ref 3.5–5.1)
Sodium: 137 mmol/L (ref 135–145)
Total Bilirubin: 0.7 mg/dL (ref 0.3–1.2)
Total Protein: 7.6 g/dL (ref 6.5–8.1)

## 2021-07-08 LAB — CBC
HCT: 50.4 % (ref 39.0–52.0)
Hemoglobin: 17 g/dL (ref 13.0–17.0)
MCH: 34 pg (ref 26.0–34.0)
MCHC: 33.7 g/dL (ref 30.0–36.0)
MCV: 100.8 fL — ABNORMAL HIGH (ref 80.0–100.0)
Platelets: 237 10*3/uL (ref 150–400)
RBC: 5 MIL/uL (ref 4.22–5.81)
RDW: 12.2 % (ref 11.5–15.5)
WBC: 7.8 10*3/uL (ref 4.0–10.5)
nRBC: 0 % (ref 0.0–0.2)

## 2021-07-08 LAB — URINE DRUG SCREEN, QUALITATIVE (ARMC ONLY)
Amphetamines, Ur Screen: NOT DETECTED
Barbiturates, Ur Screen: NOT DETECTED
Benzodiazepine, Ur Scrn: NOT DETECTED
Cannabinoid 50 Ng, Ur ~~LOC~~: NOT DETECTED
Cocaine Metabolite,Ur ~~LOC~~: NOT DETECTED
MDMA (Ecstasy)Ur Screen: NOT DETECTED
Methadone Scn, Ur: NOT DETECTED
Opiate, Ur Screen: NOT DETECTED
Phencyclidine (PCP) Ur S: NOT DETECTED
Tricyclic, Ur Screen: NOT DETECTED

## 2021-07-08 LAB — RESP PANEL BY RT-PCR (FLU A&B, COVID) ARPGX2
Influenza A by PCR: NEGATIVE
Influenza B by PCR: NEGATIVE
SARS Coronavirus 2 by RT PCR: NEGATIVE

## 2021-07-08 LAB — ETHANOL: Alcohol, Ethyl (B): <10 mg/dL (ref <10)

## 2021-07-08 LAB — SALICYLATE LEVEL: Salicylate Lvl: <7 mg/dL — ABNORMAL LOW (ref 7.0–30.0)

## 2021-07-08 LAB — ACETAMINOPHEN LEVEL: Acetaminophen (Tylenol), Serum: <10 ug/mL — ABNORMAL LOW (ref 10–30)
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
## 2021-07-08 NOTE — ED Notes (Signed)
INVOLUNTARY with all papers on chart/awaiting TTS/PSYCH consult ?
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
## 2021-07-08 NOTE — ED Notes (Signed)
Pt given sandwich tray, crackers, peanut butter, icecream and 2 drinks.  ?

## 2021-07-09 MED ORDER — ARIPIPRAZOLE 10 MG PO TABS
10.0000 mg | ORAL_TABLET | Freq: Every day | ORAL | Status: DC
  Administered 2021-07-10 – 2021-11-02 (×109): 10 mg via ORAL
  Filled 2021-07-09 (×117): qty 1

## 2021-07-09 MED ORDER — OLANZAPINE 5 MG PO TBDP
10.0000 mg | ORAL_TABLET | Freq: Three times a day (TID) | ORAL | Status: DC | PRN
  Administered 2021-07-14 – 2021-09-04 (×10): 10 mg via ORAL
  Filled 2021-07-09 (×12): qty 2

## 2021-07-09 MED ORDER — LAMOTRIGINE 100 MG PO TABS: 100.0000 mg | ORAL_TABLET | Freq: Every day | ORAL | Status: DC

## 2021-07-09 MED ORDER — PROPRANOLOL HCL 10 MG PO TABS
60.0000 mg | ORAL_TABLET | Freq: Two times a day (BID) | ORAL | Status: DC
  Administered 2021-07-10 – 2021-08-08 (×57): 60 mg via ORAL
  Filled 2021-07-09 (×60): qty 6

## 2021-07-09 MED ORDER — MIRTAZAPINE 15 MG PO TABS
30.0000 mg | ORAL_TABLET | Freq: Every day | ORAL | Status: DC
  Administered 2021-07-10 – 2022-01-31 (×186): 30 mg via ORAL
  Filled 2021-07-09 (×197): qty 2

## 2021-07-09 MED ORDER — ARIPIPRAZOLE ER 400 MG IM SRER
400.0000 mg | INTRAMUSCULAR | Status: DC
  Administered 2021-07-15 – 2022-01-27 (×8): 400 mg via INTRAMUSCULAR
  Filled 2021-07-09 (×8): qty 2

## 2021-07-09 MED ORDER — FOLIC ACID 1 MG PO TABS
1.0000 mg | ORAL_TABLET | Freq: Every day | ORAL | Status: DC
  Administered 2021-07-09 – 2022-01-31 (×197): 1 mg via ORAL
  Filled 2021-07-09 (×219): qty 1

## 2021-07-09 MED ORDER — MELATONIN 5 MG PO TABS
5.0000 mg | ORAL_TABLET | Freq: Every day | ORAL | Status: DC
  Administered 2021-07-10 – 2022-01-31 (×186): 5 mg via ORAL
  Filled 2021-07-09 (×197): qty 1

## 2021-07-09 MED ORDER — DOXEPIN HCL 10 MG PO CAPS
20.0000 mg | ORAL_CAPSULE | Freq: Every day | ORAL | Status: DC
  Administered 2021-07-10 – 2022-01-31 (×182): 20 mg via ORAL
  Filled 2021-07-09 (×213): qty 2

## 2021-07-09 MED ORDER — BENZTROPINE MESYLATE 1 MG PO TABS
2.0000 mg | ORAL_TABLET | Freq: Two times a day (BID) | ORAL | Status: DC
  Administered 2021-07-10 – 2022-01-31 (×379): 2 mg via ORAL
  Filled 2021-07-09 (×9): qty 2
  Filled 2021-07-09: qty 4
  Filled 2021-07-09 (×6): qty 2
  Filled 2021-07-09: qty 4
  Filled 2021-07-09 (×260): qty 2
  Filled 2021-07-09: qty 4
  Filled 2021-07-09 (×31): qty 2
  Filled 2021-07-09: qty 4
  Filled 2021-07-09 (×76): qty 2
  Filled 2021-07-09: qty 4
  Filled 2021-07-09 (×21): qty 2

## 2021-07-09 MED ORDER — LAMOTRIGINE 25 MG PO TABS
25.0000 mg | ORAL_TABLET | Freq: Every day | ORAL | Status: DC
  Administered 2021-07-10 – 2022-01-31 (×195): 25 mg via ORAL
  Filled 2021-07-09 (×219): qty 1

## 2021-07-09 NOTE — ED Notes (Signed)
RN left message for legal guardian requesting a return call with the contact number for the pt's group home.  ?

## 2021-07-09 NOTE — ED Notes (Signed)
Pt. To BHU from ED ambulatory without difficulty, to room  BHU 2. Report from [facility]. Pt. Is alert and oriented, warm and dry in no distress. Pt. Denies SI, HI, and AVH. Pt. Calm and cooperative. Pt. Made aware of security cameras and Q15 minute rounds. Pt. Encouraged to let Nursing staff know of any concerns or needs.  ? ?

## 2021-07-09 NOTE — ED Notes (Signed)
Patient provided with snack and beverage at this time.  ?

## 2021-07-09 NOTE — Discharge Instructions (Signed)
Be sure to take your medicines.  They will help control your problems.  Please continue to follow-up with your therapist and physician.  Return as needed. ?

## 2021-07-09 NOTE — BH Assessment (Signed)
Comprehensive Clinical Assessment (CCA) Note ? ?07/09/2021 ?Ryan Zuniga ?[id number] ? ?Chief Complaint: Patient is a 41 year old male presenting to [facility] ED under IVC. Per triage note Pt to ED via [facility] with IVC papers. Pt is threatening to burn down his group home, not taking his medication. Runs to the woods from staff. Wont take baths. Told staff he was going to kill himself. Screams at other members in the home. They are scared of him. Pt is calm and cooperative at this time. Denies SI or HI. During assessment patient appears alert and oriented x4, calm and cooperative. Patient reports "I'm threatening to hurt others, I want revenge." When patient realized that he could get in trouble by hurting others he reports that he does not want to get into trouble and understands that he needs to take his medications. Patient denies SI/HI/AH/VH ? ?Per Pysc NP Ysidro Evert patient does not meet criteria for Inpatient ?Chief Complaint  ?Patient presents with  ? Suicidal  ? Homicidal  ? ?Visit Diagnosis: Schizoaffective disorder, bipolar type  ? ? ?CCA Screening, Triage and Referral (STR) ? ?Patient Reported Information ?How did you hear about [place]? Legal System ? ?Referral name: No data recorded ?Referral phone number: No data recorded ? ?Whom do you see for routine medical problems? No data recorded ?Practice/Facility Name: No data recorded ?Practice/Facility Phone Number: No data recorded ?Name of Contact: No data recorded ?Contact Number: No data recorded ?Contact Fax Number: No data recorded ?Prescriber Name: No data recorded ?Prescriber Address (if known): No data recorded ? ?What Is the Reason for Your Visit/Call Today? Patient presents under IVC due to SI and HI ? ?How Long Has This Been Causing You Problems? No data recorded ?What Do You Feel Would Help You the Most Today? No data recorded ? ?Have You Recently Been in Any Inpatient Treatment (Hospital/Detox/Crisis Center/28-Day Program)? No data  recorded ?Name/Location of Program/Hospital:No data recorded ?How Long Were You There? No data recorded ?When Were You Discharged? No data recorded ? ?Have You Ever Received Services From [facility] Before? No data recorded ?Who Do You See at [facility]? No data recorded ? ?Have You Recently Had Any Thoughts About Hurting Yourself? No ? ?Are You Planning to Commit Suicide/Harm Yourself At This time? No ? ? ?Have you Recently Had Thoughts About [place]? No ? ?Explanation: No data recorded ? ?Have You Used Any Alcohol or Drugs in the Past 24 Hours? No ? ?How Long Ago Did You Use Drugs or Alcohol? No data recorded ?What Did You Use and How Much? No data recorded ? ?Do You Currently Have a Therapist/Psychiatrist? Yes ? ?Name of Therapist/Psychiatrist: [facility] ACT team ? ? ?Have You Been Recently Discharged From Any Office Practice or Programs? No ? ?Explanation of Discharge From Practice/Program: No data recorded ? ?  ?CCA Screening Triage Referral Assessment ?Type of Contact: Face-to-Face ? ?Is this Initial or Reassessment? No data recorded ?Date Telepsych consult ordered in CHL:  No data recorded ?Time Telepsych consult ordered in CHL:  No data recorded ? ?Patient Reported Information Reviewed? No data recorded ?Patient Left Without Being Seen? No data recorded ?Reason for Not Completing Assessment: No data recorded ? ?Collateral Involvement: No data recorded ? ?Does Patient Have a Stage manager Guardian? No data recorded ?Name and Contact of Legal Guardian: No data recorded ?If Minor and Not Living with Parent(s), Who has Custody? No data recorded ?Is CPS involved or ever been involved? Never ? ?Is APS involved  or ever been involved? Never ? ? ?Patient Determined To Be At Risk for Harm To Self or Others Based on Review of Patient Reported Information or Presenting Complaint? No ? ?Method: No data recorded ?Availability of Means: No data recorded ?Intent: No data  recorded ?Notification Required: No data recorded ?Additional Information for Danger to Others Potential: No data recorded ?Additional Comments for Danger to Others Potential: No data recorded ?Are There Guns or Other Weapons in [place]? No data recorded ?Types of Guns/Weapons: No data recorded ?Are These Weapons Safely Secured?                            No data recorded ?Who Could Verify You Are Able To Have These Secured: No data recorded ?Do You Have any Outstanding Charges, Pending Court Dates, Parole/Probation? No data recorded ?Contacted To Inform of Risk of Harm To Self or Others: No data recorded ? ?Location of Assessment: [facility] ED ? ? ?Does Patient Present under Involuntary Commitment? Yes ? ?IVC Papers Initial File Date: 07/08/21 ? ? ?[place] of Residence: [place] ? ? ?Patient Currently Receiving the Following Services: Group Home; ACTT (Assertive Community Treatment); Medication Management ? ? ?Determination of Need: Emergent (2 hours) ? ? ?Options For Referral: No data recorded ? ? ? ?CCA Biopsychosocial ?Intake/Chief Complaint:  No data recorded ?Current Symptoms/Problems: No data recorded ? ?Patient Reported Schizophrenia/Schizoaffective Diagnosis in Past: Yes ? ? ?Strengths: Patient is able to communicate ? ?Preferences: No data recorded ?Abilities: No data recorded ? ?Type of Services Patient Feels are Needed: No data recorded ? ?Initial Clinical Notes/Concerns: No data recorded ? ?Mental Health Symptoms ?Depression:   ?None ?  ?Duration of Depressive symptoms:  ?Greater than two weeks ?  ?Mania:   ?Change in energy/activity; Increased Energy; Racing thoughts; Recklessness ?  ?Anxiety:    ?Difficulty concentrating; Restlessness; Tension; Worrying ?  ?Psychosis:   ?None ?  ?Duration of Psychotic symptoms: No data recorded  ?Trauma:   ?None ?  ?Obsessions:   ?None ?  ?Compulsions:   ?Absent insight/delusional; "Driven" to perform behaviors/acts ?  ?Inattention:   ?None ?  ?Hyperactivity/Impulsivity:    ?None ?  ?Oppositional/Defiant Behaviors:   ?Resentful; Spiteful ?  ?Emotional Irregularity:   ?Recurrent suicidal behaviors/gestures/threats ?  ?Other Mood/Personality Symptoms:  No data recorded  ? ?Mental Status Exam ?Appearance and self-care  ?Stature:   ?Tall ?  ?Weight:   ?Overweight ?  ?Clothing:   ?Casual ?  ?Grooming:   ?Normal ?  ?Cosmetic use:   ?None ?  ?Posture/gait:   ?Normal ?  ?Motor activity:   ?Not Remarkable ?  ?Sensorium  ?Attention:   ?Normal ?  ?Concentration:   ?Normal ?  ?Orientation:   ?X5 ?  ?Recall/memory:   ?Normal ?  ?Affect and Mood  ?Affect:   ?Appropriate ?  ?Mood:   ?Other (Comment) ?  ?Relating  ?Eye contact:   ?Normal ?  ?Facial expression:   ?Responsive ?  ?Attitude toward examiner:   ?Cooperative ?  ?Thought and Language  ?Speech flow:  ?Garbled; Pressured ?  ?Thought content:   ?Appropriate to Mood and Circumstances ?  ?Preoccupation:   ?None ?  ?Hallucinations:   ?None ?  ?Organization:  No data recorded  ?Executive Functions  ?Fund of Knowledge:   ?[place] ?  ?Intelligence:   ?Average ?  ?Abstraction:   ?Normal ?  ?Judgement:   ?Fair ?  ?Reality Testing:   ?Realistic ?  ?Insight:   ?  Lacking ?  ?Decision Making:   ?Impulsive ?  ?Social Functioning  ?Social Maturity:   ?Irresponsible ?  ?Social Judgement:   ?Heedless ?  ?Stress  ?Stressors:   ?Housing ?  ?Coping Ability:   ?Exhausted ?  ?Skill Deficits:   ?Intellect/education; Decision making ?  ?Supports:   ?Friends/Service system ?  ? ? ?Religion: ?Religion/Spirituality ?Are You A Religious Person?: No ? ?Leisure/Recreation: ?Leisure / Recreation ?Do You Have Hobbies?: No ? ?Exercise/Diet: ?Exercise/Diet ?Do You Exercise?: No ?Have You Gained or Lost A Significant Amount of Weight in the Past Six Months?: No ?Do You Follow a Special Diet?: No ?Do You Have Any Trouble Sleeping?: No ? ? ?CCA Employment/Education ?Employment/Work Situation: ?Employment / Work Situation ?Employment Situation: On disability ?Why is Patient on  Disability: Mental Health ?How Long has Patient Been on Disability: Unknown ?Has Patient ever Been in the Military?: No ? ?Education: ?Education ?Did You Attend College?: No ?Did You Have An Individualized Education Program (IIEP):

## 2021-07-09 NOTE — ED Notes (Signed)
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
## 2021-07-09 NOTE — ED Notes (Signed)
Pt refuses meds tonight after being informed of what is ordered and asked what he needs to drink. Once meds were prepared and provided to pt he places med cup to his mouth and then takes it away stating 'i good. Thanks." Nurse communicates with pt about med admin and plan, pt repeatedly states same for another 6-7 times before nurse exits after he lays down and pulls blanket over head. ?

## 2021-07-09 NOTE — Consult Note (Signed)
Patient seen face to face.  He is denying any suicidal or homicidal ideations.  No current auditory or visual hallucinations or paranoia.  He is compliant with medications his behavior has been compliant, cooperative.  Patient advised that his group home has refused to take him back due to some of his behaviors.  Patient voices understanding.  Writer released involuntary commitment. ? ?

## 2021-07-09 NOTE — ED Notes (Signed)
IVC rescinded/ pt is VOL/pending placement ?

## 2021-07-09 NOTE — ED Provider Notes (Signed)
Patient seen by psychiatry who feels they are okay for discharge. ?  ?Nena Polio, MD ?07/09/21 0018 ? ?
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
## 2021-07-09 NOTE — ED Notes (Signed)
VOL / pending TOC placement 

## 2021-07-09 NOTE — ED Notes (Signed)
Report received from Amy, RN including SBAR. Patient alert and oriented, warm and dry, in no acute distress. Patient denies SI, HI, AVH and pain. Patient made aware of Q15 minute rounds and security cameras for their safety. Patient instructed to come to this nurse with needs or concerns.  

## 2021-07-09 NOTE — ED Notes (Signed)
IVC/ Consult completed/ Pending D/C  ?
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
## 2021-07-09 NOTE — ED Notes (Signed)
Called Guardian number for after hours services and informed patient is being discharged.  Writer called group home [facility] to notify patient is up for discharge. Employee states they can come and pick up after 8am due to staffing. EDP and Charge RN made aware.  ?

## 2021-07-10 NOTE — ED Provider Notes (Signed)
Emergency Medicine Observation Re-evaluation Note ? ?Ryan Zuniga is a 41 y.o. male, seen on rounds today.  Pt initially presented to the ED for complaints of Suicidal and Homicidal ? ?Currently, the patient is resting.  ? ?Physical Exam  ?Blood pressure 123/80, pulse 74, temperature 98 ?F (36.7 ?C), temperature source Oral, resp. rate 17, height '5\' 8"'$  (1.727 m), weight 68 kg, SpO2 97 %. ? ?Physical Exam ?General: resting  ?Pulm: Normal WOB ? ? ?ED Course / MDM  ?  ? ?I have reviewed the labs performed to date as well as medications administered while in observation.  Recent changes in the last 24 hours include none ? ?Plan  ? ?Current plan is to continue to wait for SW help ? ?Patient is not under full IVC at this time. ?  ?Vanessa White Shield, MD ?07/10/21 [phone] ? ?

## 2021-07-10 NOTE — ED Notes (Signed)
Report to include Situation, Background, Assessment, and Recommendations received from Dorothy RN. Patient alert and oriented, warm and dry, in no acute distress. Patient denies SI, HI, AVH and pain. Patient made aware of Q15 minute rounds and security cameras for their safety. Patient instructed to come to me with needs or concerns.  

## 2021-07-10 NOTE — ED Notes (Signed)
Pt received snack and drink ?

## 2021-07-10 NOTE — ED Notes (Signed)
Pt given snack. 

## 2021-07-10 NOTE — TOC Progression Note (Signed)
Transition of Care (TOC) - Progression Note  ? ? ?Patient Details  ?Name: Ryan Zuniga ?MRN: [id number] ?Date of Birth: 10/06/1980 ? ?Transition of Care (TOC) CM/SW Contact  ?Shelbie Hutching, RN ?Phone Number: ?07/10/2021, 1:59 PM ? ?Clinical Narrative:    ?Message left for Janace Litten again with [employer], have not heard back from her yet.  Touched base with [facility], they have reached out to the [facility] as did I and they will cont to pressure them for funds.  Stacy with [facility] has a lead on a placement for one of their high living homes but they will need more funds. ? ?TOC will follow back up with all parties on Monday.  ? ? ?Expected Discharge Plan: Group Home ?Barriers to Discharge: Other (must enter comment) (Group home will not take back) ? ?Expected Discharge Plan and Services ?Expected Discharge Plan: Group Home ?  ?Discharge Planning Services: CM Consult ?  ?Living arrangements for the past 2 months: Group Home ?                ?DME Arranged: N/A ?DME Agency: NA ?  ?  ?  ?HH Arranged: NA ?[place] Agency: NA ?  ?  ?  ? ? ?Social Determinants of Health (SDOH) Interventions ?  ? ?Readmission Risk Interventions ?   ? View : No data to display.  ?  ?  ?  ? ? ?

## 2021-07-11 NOTE — ED Notes (Signed)
Pt denies SI/HI/AVH on assessment 

## 2021-07-11 NOTE — ED Provider Notes (Signed)
Emergency Medicine Observation Re-evaluation Note ? ?Ritik Stavola is a 41 y.o. male, seen on rounds today.  Pt initially presented to the ED for complaints of Suicidal and Homicidal ? ?Currently, the patient is resting.  Alerts easily to voice, reports no complaints.  Slept well last night ? ?Physical Exam  ? ?Vitals:  ? 07/10/21 0931 07/10/21 2016  ?BP: 125/83 112/73  ?Pulse: 98 63  ?Resp: 17 15  ?Temp: 98 ?F (36.7 ?C) 98 ?F (36.7 ?C)  ?SpO2: 98% 96%  ? ? ? ?Physical Exam ?General: resting  ?Pulm: Normal WOB ? ? ?ED Course / MDM  ?  ? ?I have reviewed the labs performed to date as well as medications administered while in observation.  Recent changes in the last 24 hours include none ? ?Plan  ? ?Current plan is to continue to wait for SW help, currently expecting to discharge to a group home ? ?Patient is not under full IVC at this time. ?  ?  ?Delman Kitten, MD ?07/11/21 0900 ? ?
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
## 2021-07-11 NOTE — ED Notes (Signed)
Report to include Situation, Background, Assessment, and Recommendations received from Dorothy RN. Patient alert and oriented, warm and dry, in no acute distress. Patient denies SI, HI, AVH and pain. Patient made aware of Q15 minute rounds and security cameras for their safety. Patient instructed to come to me with needs or concerns.  

## 2021-07-11 NOTE — ED Notes (Signed)
Pt received breakfast tray ?Vitals obtained by tech.  ?Pt asked when he was going to be leaving here. Tech explained process of finding placement in new group home. Pt seems calm and cooperative.  ?

## 2021-07-11 NOTE — ED Notes (Addendum)
Pt's step father, Mr Edison Pace called in to say he does not want the pt to call him or his wife, pt's mother. I informed him that I will let the pt know.  ?

## 2021-07-11 NOTE — ED Notes (Signed)
VOL/pending group home placement ?

## 2021-07-12 ENCOUNTER — Emergency Department: Payer: Medicaid Other
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
## 2021-07-12 NOTE — ED Notes (Signed)
Hospital meal provided, pt tolerated w/o complaints.  Waste discarded appropriately.  

## 2021-07-12 NOTE — ED Notes (Signed)
Pt received breakfast tray ?Pt vitals obtained ?Pt offered shower and declined  ?
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
## 2021-07-12 NOTE — ED Notes (Signed)
Pt's mother called to report to staff that this patient has not had a shower/bath in >12 months.  Reported that at his prior group home instead of bathing pt they put him in an individual room d/t body odor.  Pt is very malodorous, staff has been able to encourage pt while on unit.  Unlocked bathroom door to allow patient to shower.  Staff continuously monitored dayroom outside of bathroom door during pt shower.  Pt was given hygiene items as well as:  I clean top, 1 clean bottom, with 1 pair of disposable underwear.  Pt changed out into clean clothing.  Staff disposed of all shower supplies. Shower room cleaned and secured for next use.  ?

## 2021-07-12 NOTE — ED Provider Notes (Signed)
Emergency Medicine Observation Re-evaluation Note ? ?Ryan Zuniga is a 41 y.o. male, seen on rounds today.  Pt initially presented to the ED for complaints of Suicidal and Homicidal ? ?Currently, the patient is asleep.  ? ?Physical Exam  ?Blood pressure 117/78, pulse 68, temperature 97.8 ?F (36.6 ?C), temperature source Oral, resp. rate 16, height '5\' 8"'$  (1.727 m), weight 68 kg, SpO2 97 %. ? ?Physical Exam ?General: No apparent distress ?Neuro: sleeping ?   ? ?ED Course / MDM  ?  ? ?I have reviewed the labs performed to date as well as medications administered while in observation.  Recent changes in the last 24 hours include none  ? ?Plan  ? ?Current plan is to continue to wait for placement ?Patient is not under full IVC at this time. ?  ?Vanessa [place], MD ?07/12/21 [phone] ? ?

## 2021-07-12 NOTE — ED Notes (Signed)
Report received from Hewan M, RN including SBAR. On initial round after report Pt is warm/dry, resting quietly in room without any s/s of distress.  Will continue to monitor throughout shift as ordered for any changes in behaviors and for continued safety.   

## 2021-07-12 NOTE — ED Notes (Signed)
Report to include Situation, Background, Assessment, and Recommendations received from [name]. Patient alert and oriented, warm and dry, in no acute distress. Patient denies SI, HI, AVH and pain. Patient made aware of Q15 minute rounds and security cameras for their safety. Patient instructed to come to me with needs or concerns.  

## 2021-07-12 NOTE — ED Notes (Signed)
Pt received clean scrub set and shower supplies! ?Pt showered today, independently in the BHU shower! ?RN and tech gave words of encouragement and told pt how great he would feel.  ?Pt did ask for gloves, but was positively enforced that he needed to get his hands clean.  ?Pt successfully showered and is now clean.  ?

## 2021-07-12 NOTE — ED Notes (Signed)
Pts father called and wanted the pt (son) to call him back. RN told the father that if pt washes his hands he can use the phone to call him back. Tech went into unit with towel and soap and the phone. Tech told pt that his father wanted him to call but he needed to wash his hands first. PT very adamantly said no that he did not want to wash his hands. Tech said "so you do not want to call your dad back?" ?Pt said no he did not   ?
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
## 2021-07-13 NOTE — ED Notes (Signed)
Pt given snack and drink 
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
## 2021-07-13 NOTE — ED Notes (Signed)
Dinner tray given

## 2021-07-13 NOTE — ED Notes (Signed)
Report received from Amy, RN including SBAR. Patient alert and oriented, warm and dry, in no acute distress. Patient denies SI, HI, AVH and pain. Patient made aware of Q15 minute rounds and security cameras for their safety. Patient instructed to come to this nurse with needs or concerns.  
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
## 2021-07-13 NOTE — ED Notes (Signed)
VOL/pending placement 

## 2021-07-14 MED ORDER — NICOTINE 21 MG/24HR TD PT24
21.0000 mg | MEDICATED_PATCH | Freq: Every day | TRANSDERMAL | Status: DC
  Administered 2021-07-14: 21 mg via TRANSDERMAL
  Filled 2021-07-14 (×7): qty 1

## 2021-07-14 NOTE — ED Notes (Signed)
This tech and Ryan Zuniga walked into pt room to obtain a set of vs and provide pt w/ a breakfast tray. Pt got verbally aggressive toward staff. Pt yelled at staff, "you need to get the fuck out of here. I'm not dealing with your shit."  Pt refused breakfast, and vs. White milk given. Pt will be continue to be monitored. No other needs found at this moment.   ?

## 2021-07-14 NOTE — ED Notes (Signed)
Pt up and to restroom, requests to be allowed to go smoke. Pt educated on hospital policy and informed that this nurse would reach out to EDP. Secure chat sent to Dr. Beather Arbour at this time, will follow up ?

## 2021-07-14 NOTE — ED Notes (Signed)
During nursing assessment was A/Ox 3 .  Marland KitchenMr Syme  stated that he does not currently have thoughts or feelings of SI/HI. Pt reported he does not currently have auditory or visual hallucinations.  Pt affect is bright , eye contact is intermittent , speech is slurred d/t speech  with appropriate verbiage noted.  Staff addressed any feelings or concerns that have been brought up. Continue to monitor patient as ordered for any changes in behaviors and for continued safety.   ?

## 2021-07-14 NOTE — ED Notes (Signed)
Report received from Katie, RN including SBAR. Patient alert and oriented, warm and dry, in no acute distress. Patient denies SI, HI, AVH and pain. Patient made aware of Q15 minute rounds and security cameras for their safety. Patient instructed to come to this nurse with needs or concerns.  

## 2021-07-14 NOTE — ED Notes (Signed)
Guardian did not answer, secure message left ?

## 2021-07-14 NOTE — ED Notes (Signed)
Staff attempted to administer medications to patient.  Ryan Zuniga refused, stating "nope I'm good"  Pt has been verbally aggressive with staff this a.m. Staff explained that if he did not take his medications that he would lose his privilege of watching tv in his room as well as we would be informing his guardian.  Pt did not answer.  TV was turned off pt voluntarily gave remote to security staff.  Will contact guardian to update  ?

## 2021-07-14 NOTE — ED Notes (Signed)
VOL/Pending Placement 

## 2021-07-14 NOTE — ED Notes (Signed)
Patient provided snack at appropriate snack time.  Pt consumed 100% of snack provided, tolerated well w/o complaints   Trash disposted of appropriately by patient.  

## 2021-07-14 NOTE — ED Notes (Signed)
Meal was given with soda. ?

## 2021-07-14 NOTE — ED Notes (Signed)
Snack and drink given 

## 2021-07-14 NOTE — ED Notes (Signed)
Report received from Chandan B, RN including SBAR. On initial round after report Pt is warm/dry, resting quietly in room without any s/s of distress.  Will continue to monitor throughout shift as ordered for any changes in behaviors and for continued safety.   

## 2021-07-14 NOTE — ED Provider Notes (Signed)
Emergency Medicine Observation Re-evaluation Note ? ?Ryan Zuniga is a 41 y.o. male, seen on rounds today.  Pt initially presented to the ED for complaints of Suicidal and Homicidal ?Currently, the patient is watching tv without any acute complaints ? ?Physical Exam  ?BP 125/84 (BP Location: Left Arm)   Pulse 72   Temp 98.2 ?F (36.8 ?C) (Oral)   Resp 18   Ht '5\' 8"'$  (1.727 m)   Wt 68 kg   SpO2 97%   BMI 22.81 kg/m?  ?Physical Exam ?General: no acute distress ?Psych: calm ? ?ED Course / MDM  ?EKG:  ? ?I have reviewed the labs performed to date as well as medications administered while in observation.  Recent changes in the last 24 hours include none. ? ?Plan  ?Current plan is for social work dispo. ? [name] is not under involuntary commitment. ? ? ?  ?Lucrezia Starch, MD ?07/14/21 1501 ? ?

## 2021-07-14 NOTE — ED Notes (Signed)
Darell, Animal nutritionist attempted to provide pt with breakfast tray. Pt continues to refuse breakfast.  ?

## 2021-07-15 NOTE — ED Provider Notes (Signed)
Emergency Medicine Observation Re-evaluation Note ? ?Lenard Kampf is a 41 y.o. male, seen on rounds today.  Pt initially presented to the ED for complaints of Suicidal and Homicidal ?Currently, the patient is sleeping, wakes up easily to voice.  Reports no concerns overnight. ? ?Physical Exam  ?BP 120/82 (BP Location: Left Arm)   Pulse 69   Temp 98.2 ?F (36.8 ?C) (Oral)   Resp 16   Ht '5\' 8"'$  (1.727 m)   Wt 68 kg   SpO2 98%   BMI 22.81 kg/m?  ?Physical Exam ?General: no acute distress ?Psych: calm ?Respirations even and unlabored ? ?ED Course / MDM  ?EKG:  ? ?I have reviewed the labs performed to date as well as medications administered while in observation.  Recent changes in the last 24 hours include none. ? ?Plan  ?Current plan is for social work dispo. ? Boykin Baetz is not under involuntary commitment. ? ?  ?Delman Kitten, MD ?07/15/21 [phone] ? ?

## 2021-07-15 NOTE — ED Notes (Signed)
Report received from Katie, RN including SBAR. Patient alert and oriented, warm and dry, in no acute distress. Patient denies SI, HI, AVH and pain. Patient made aware of Q15 minute rounds and security cameras for their safety. Patient instructed to come to this nurse with needs or concerns.  

## 2021-07-15 NOTE — ED Notes (Signed)
Pt's mother called wanted update on [place]. Staff offered to have give a message to pt to call his mother back.  Mother stated they are not on speaking terms currently.  HIPPA compliant update given to mother. ?

## 2021-07-15 NOTE — ED Notes (Signed)
Pt came to the nurse station door to throw his trash away when this staff and security officer asked pt to have a sit in the dayroom chair to get a set of VS. Pt ignored staff, mumbled words, and proceeded to walk back to his room. Katie, RN aware.  ?

## 2021-07-15 NOTE — ED Notes (Signed)
Report received from Denney B, RN including SBAR. On initial round after report Pt is warm/dry, resting quietly in room without any s/s of distress.  Will continue to monitor throughout shift as ordered for any changes in behaviors and for continued safety.   

## 2021-07-15 NOTE — ED Notes (Signed)
Consulted with pharmacy, Corene Cornea, as pt received abilify injection this AM. Corene Cornea states that there is a 14 day over lap to administer PO abilify and should receive it. Will administer at this time ?

## 2021-07-15 NOTE — ED Notes (Signed)
Hospital meal provided, pt tolerated w/o complaints.  Waste discarded appropriately.  

## 2021-07-15 NOTE — ED Notes (Signed)
VOL/pending placement 

## 2021-07-15 NOTE — ED Notes (Signed)
Patient provided snack at appropriate snack time.  Pt consumed 100% of snack provided, tolerated well w/o complaints   Trash disposted of appropriately by patient.  

## 2021-07-15 NOTE — ED Notes (Signed)
During nursing assessment was A/Ox 3 .  Marland KitchenMr Zuniga  stated that he does not currently have thoughts or feelings of SI/HI.  Ryan Zuniga reported that he does not currently have auditory or visual hallucinations.  Pt affect is bright , eye contact is intermittent , speech is normal rate and volume  with appropriate verbiage noted.  Staff addressed any feelings or concerns that have been brought up.  Medications were administered as ordered. Continue to monitor patient as ordered for any changes in behaviors and for continued safety.   ?

## 2021-07-16 NOTE — ED Notes (Signed)
Pt was provided dinner tray  ?

## 2021-07-16 NOTE — TOC Progression Note (Signed)
Transition of Care (TOC) - Progression Note  ? ? ?Patient Details  ?Name: Ryan Zuniga ?MRN: [id number] ?Date of Birth: June 27, 1980 ? ?Transition of Care (TOC) CM/SW Contact  ?Shelbie Hutching, RN ?Phone Number: ?07/16/2021, 9:56 AM ? ?Clinical Narrative:    ?RNCM called and left a message for patient's guardian, Remo Lipps for return call and called and left a message for the guardianship agencies owner, Marzetta Board to return call.   ?Will also follow up with [facility] today.  ? ? ?Expected Discharge Plan: Group Home ?Barriers to Discharge: Other (must enter comment) (Group home will not take back) ? ?Expected Discharge Plan and Services ?Expected Discharge Plan: Group Home ?  ?Discharge Planning Services: CM Consult ?  ?Living arrangements for the past 2 months: Group Home ?                ?DME Arranged: N/A ?DME Agency: NA ?  ?  ?  ?HH Arranged: NA ?[place] Agency: NA ?  ?  ?  ? ? ?Social Determinants of Health (SDOH) Interventions ?  ? ?Readmission Risk Interventions ?   ? View : No data to display.  ?  ?  ?  ? ? ?

## 2021-07-16 NOTE — ED Notes (Signed)
Snack and drink given 

## 2021-07-16 NOTE — TOC Progression Note (Signed)
Transition of Care (TOC) - Progression Note  ? ? ?Patient Details  ?Name: Kashmir Lysaght ?MRN: [id number] ?Date of Birth: 1980/06/15 ? ?Transition of Care (TOC) CM/SW Contact  ?Shelbie Hutching, RN ?Phone Number: ?07/16/2021, 11:39 AM ? ?Clinical Narrative:    ?Received call back from Stacy from [place] is actually not the patient's assigned guardian it is [name] [phone] ?O: [phone]. ?Patient has been assigned a Care Coordinator with [facility] [phone] email [email or web address].   ? ? ?Expected Discharge Plan: Group Home ?Barriers to Discharge: Other (must enter comment) (Group home will not take back) ? ?Expected Discharge Plan and Services ?Expected Discharge Plan: Group Home ?  ?Discharge Planning Services: CM Consult ?  ?Living arrangements for the past 2 months: Group Home ?                ?DME Arranged: N/A ?DME Agency: NA ?  ?  ?  ?HH Arranged: NA ?[place] Agency: NA ?  ?  ?  ? ? ?Social Determinants of Health (SDOH) Interventions ?  ? ?Readmission Risk Interventions ?   ? View : No data to display.  ?  ?  ?  ? ? ?

## 2021-07-16 NOTE — ED Provider Notes (Signed)
Emergency Medicine Observation Re-evaluation Note ? ?Ryan Zuniga is a 41 y.o. male, seen initially in the emergency department for psychiatric evaluation.  No acute events overnight. ? ?Physical Exam  ?BP 128/78 (BP Location: Right Arm)   Pulse 77   Temp 98 ?F (36.7 ?C) (Oral)   Resp 18   Ht '5\' 8"'$  (1.727 m)   Wt 68 kg   SpO2 98%   BMI 22.81 kg/m?  ? ? ?ED Course / MDM  ? ?No new lab work for review ? ?Plan  ?Current plan is for placement to an appropriate living facility once available. ? Cesar Rogerson is not under involuntary commitment. ? ? ?  ?Harvest Dark, MD ?07/16/21 1437 ? ?

## 2021-07-16 NOTE — ED Notes (Signed)
Pt given breakfast tray

## 2021-07-16 NOTE — ED Notes (Signed)
VOL/Pending Placement 

## 2021-07-16 NOTE — ED Notes (Signed)
VOL  PENDING  PLACEMENT 

## 2021-07-16 NOTE — TOC Progression Note (Signed)
Transition of Care (TOC) - Progression Note  ? ? ?Patient Details  ?Name: Ryan Zuniga ?MRN: [id number] ?Date of Birth: [birth year] ? ?Transition of Care (TOC) CM/SW Contact  ?Shelbie Hutching, RN ?Phone Number: ?07/16/2021, 4:31 PM ? ?Clinical Narrative:    ?Message left for Dominique at [facility] for return call to discuss dc planning.  ? ? ?Expected Discharge Plan: Group Home ?Barriers to Discharge: Other (must enter comment) (Group home will not take back) ? ?Expected Discharge Plan and Services ?Expected Discharge Plan: Group Home ?  ?Discharge Planning Services: CM Consult ?  ?Living arrangements for the past 2 months: Group Home ?                ?DME Arranged: N/A ?DME Agency: NA ?  ?  ?  ?HH Arranged: NA ?[place] Agency: NA ?  ?  ?  ? ? ?Social Determinants of Health (SDOH) Interventions ?  ? ?Readmission Risk Interventions ?   ? View : No data to display.  ?  ?  ?  ? ? ?

## 2021-07-17 NOTE — ED Notes (Signed)
Pt denies needing a shower at this time ?

## 2021-07-17 NOTE — ED Notes (Signed)
Patient's dad Fritz Pickerel) called and asked for patient to call him at [phone]. A phone was brought to the patient and was offered to call his dad so he could talk to him. Patient declined. ?

## 2021-07-17 NOTE — ED Notes (Signed)
Report to include Situation, Background, Assessment, and Recommendations received from [facility]. Patient alert and oriented, warm and dry, in no acute distress. Patient denies SI, HI, AVH and pain. Patient made aware of Q15 minute rounds and security cameras for their safety. Patient instructed to come to me with needs or concerns. ? ?

## 2021-07-17 NOTE — TOC Progression Note (Signed)
Transition of Care (TOC) - Progression Note  ? ? ?Patient Details  ?Name: Cordie Buening ?MRN: [id number] ?Date of Birth: July 05, 1980 ? ?Transition of Care (TOC) CM/SW Contact  ?Shelbie Hutching, RN ?Phone Number: ?07/17/2021, 2:30 PM ? ?Clinical Narrative:    ?Received a call from Celestia Khat, she will do what she can to assist with placement- I gave her the Guardian's contact information, they have a lead on placement but Dominique with [facility] will need to help with funding.   ? ? ?Expected Discharge Plan: Group Home ?Barriers to Discharge: Other (must enter comment) (Group home will not take back) ? ?Expected Discharge Plan and Services ?Expected Discharge Plan: Group Home ?  ?Discharge Planning Services: CM Consult ?  ?Living arrangements for the past 2 months: Group Home ?                ?DME Arranged: N/A ?DME Agency: NA ?  ?  ?  ?HH Arranged: NA ?[place] Agency: NA ?  ?  ?  ? ? ?Social Determinants of Health (SDOH) Interventions ?  ? ?Readmission Risk Interventions ?   ? View : No data to display.  ?  ?  ?  ? ? ?

## 2021-07-17 NOTE — ED Provider Notes (Signed)
Emergency Medicine Observation Re-evaluation Note ? ?Abou Sterkel is a 41 y.o. male, seen initially in the emergency department for psychiatric evaluation.  No acute events overnight. ? ?Physical Exam  ?BP 123/75 (BP Location: Right Arm)   Pulse 69   Temp 98 ?F (36.7 ?C) (Oral)   Resp 18   Ht '5\' 8"'$  (1.727 m)   Wt 68 kg   SpO2 98%   BMI 22.81 kg/m?  ? ?Alert, resting comfortably in bed without distress.  Watching TV ?Normal work of breathing ?Extremities appear warm and well-perfused ? ?ED Course / MDM  ? ?No new lab work for review ? ?Plan  ?Current plan is for placement to an appropriate living facility once available. ? Keithen Capo is not under involuntary commitment. ?  Delman Kitten, MD ?07/17/21 [phone] ? ?

## 2021-07-17 NOTE — ED Notes (Signed)
VOL/Pending Placement 

## 2021-07-17 NOTE — ED Notes (Signed)
Pt given snack. 

## 2021-07-17 NOTE — ED Notes (Signed)
Pt received dinner tray.

## 2021-07-17 NOTE — ED Notes (Signed)
Snack and beverage given. 

## 2021-07-17 NOTE — ED Notes (Signed)
Pt resting with his lights off, cont to monitor ?

## 2021-07-17 NOTE — TOC Progression Note (Signed)
Transition of Care (TOC) - Progression Note  ? ? ?Patient Details  ?Name: Agamjot Kilgallon ?MRN: [id number] ?Date of Birth: 08-May-1980 ? ?Transition of Care (TOC) CM/SW Contact  ?Shelbie Hutching, RN ?Phone Number: ?07/17/2021, 9:46 AM ? ?Clinical Narrative:    ?Message left for Celestia Khat with [facility], yesterday for return call, Secure Email sent this morning.   ? ? ?Expected Discharge Plan: Group Home ?Barriers to Discharge: Other (must enter comment) (Group home will not take back) ? ?Expected Discharge Plan and Services ?Expected Discharge Plan: Group Home ?  ?Discharge Planning Services: CM Consult ?  ?Living arrangements for the past 2 months: Group Home ?                ?DME Arranged: N/A ?DME Agency: NA ?  ?  ?  ?HH Arranged: NA ?[place] Agency: NA ?  ?  ?  ? ? ?Social Determinants of Health (SDOH) Interventions ?  ? ?Readmission Risk Interventions ?   ? View : No data to display.  ?  ?  ?  ? ? ?

## 2021-07-17 NOTE — ED Notes (Addendum)
Pt lying down watching tv ?

## 2021-07-18 NOTE — ED Notes (Signed)
Patient resting quietly in room. No noted distress or abnormal behaviors noted. Will continue 15 minute checks and observation by security camera for safety. 

## 2021-07-18 NOTE — ED Notes (Signed)
VOL/pending placement 

## 2021-07-18 NOTE — ED Provider Notes (Signed)
Emergency Medicine Observation Re-evaluation Note ? ?Ryan Zuniga is a 41 y.o. male, seen on rounds today.  Pt initially presented to the ED for complaints of Suicidal and Homicidal ? ?Currently, the patient is resting ? ?Physical Exam  ?Blood pressure 121/83, pulse 70, temperature 98.6 ?F (37 ?C), temperature source Oral, resp. rate 17, height '5\' 8"'$  (1.727 m), weight 68 kg, SpO2 97 %. ? ?Physical Exam ?Physical Exam ?General: No apparent distress ?Resp: normal WOB ?Neuro: asleep ?   ?   ? ?ED Course / MDM  ?  ? ?I have reviewed the labs performed to date as well as medications administered while in observation.  Recent changes in the last 24 hours include non3  ? ?Plan  ? ?Current plan is to continue to wait for  sw placement  ?Patient is not under full IVC at this time. ?  ?Vanessa Basile, MD ?07/18/21 0720 ? ?

## 2021-07-19 NOTE — ED Notes (Signed)
Pt given breakfast tray

## 2021-07-19 NOTE — ED Notes (Signed)
Snack and beverage given. 

## 2021-07-19 NOTE — ED Notes (Signed)
VOL/pending placement 

## 2021-07-19 NOTE — ED Provider Notes (Signed)
Emergency Medicine Observation Re-evaluation Note ? ?Ryan Zuniga is a 41 y.o. male, seen on rounds today.  Pt initially presented to the ED for complaints of Suicidal and Homicidal ?Currently, the patient is resting comfortably. ? ?Physical Exam  ?BP (!) 123/95 (BP Location: Left Arm)   Pulse 72   Temp 98.5 ?F (36.9 ?C)   Resp 16   Ht '5\' 8"'$  (1.727 m)   Wt 68 kg   SpO2 97%   BMI 22.81 kg/m?  ?Physical Exam ?General: no distress ?Lungs: no increased WOB ?Psych: no agitatio ? ?ED Course / MDM  ?EKG:  ? ?I have reviewed the labs performed to date as well as medications administered while in observation.  Recent changes in the last 24 hours include none. ? ?Plan  ?Current plan is for SW placement. ? Remy Voiles is not under involuntary commitment. ? ? ?  ?Rada Hay, MD ?07/19/21 [phone] ? ?

## 2021-07-19 NOTE — ED Notes (Signed)
Report to include Situation, Background, Assessment, and Recommendations received from [facility]. Patient alert and oriented, warm and dry, in no acute distress. Patient denies SI, HI, AVH and pain. Patient made aware of Q15 minute rounds and security cameras for their safety. Patient instructed to come to me with needs or concerns. ? ?

## 2021-07-20 NOTE — ED Notes (Signed)
VOL  PENDING  PLACEMENT 

## 2021-07-20 NOTE — TOC Progression Note (Signed)
Transition of Care (TOC) - Progression Note  ? ? ?Patient Details  ?Name: Shondale Quinley ?MRN: [id number] ?Date of Birth: 27-Sep-1980 ? ?Transition of Care (TOC) CM/SW Contact  ?Shelbie Hutching, RN ?Phone Number: ?07/20/2021, 4:37 PM ? ?Clinical Narrative:    ?Reached out to Legal Guardian, Justice, Remo Lipps answered the phone and reports that he and Justice are both working on the case.  RNCM asked for an update and what they have been able to find out from [place], he will reach out to [place] and give me a call back.   ?My initial understanding was that they had prospective placement but that they need help from the [facility] for funding.  ? ? ?Expected Discharge Plan: Group Home ?Barriers to Discharge: Other (must enter comment) (Group home will not take back) ? ?Expected Discharge Plan and Services ?Expected Discharge Plan: Group Home ?  ?Discharge Planning Services: CM Consult ?  ?Living arrangements for the past 2 months: Group Home ?                ?DME Arranged: N/A ?DME Agency: NA ?  ?  ?  ?HH Arranged: NA ?[place] Agency: NA ?  ?  ?  ? ? ?Social Determinants of Health (SDOH) Interventions ?  ? ?Readmission Risk Interventions ?   ? View : No data to display.  ?  ?  ?  ? ? ?

## 2021-07-20 NOTE — ED Notes (Signed)
VOL/pending placement 

## 2021-07-20 NOTE — ED Notes (Signed)
Pt given snack. 

## 2021-07-20 NOTE — ED Notes (Signed)
Report received from Andrea, RN including SBAR. Patient alert and oriented, warm and dry, in no acute distress. Patient denies SI, HI, AVH and pain. Patient made aware of Q15 minute rounds and security cameras for their safety. Patient instructed to come to this nurse with needs or concerns.  

## 2021-07-20 NOTE — ED Notes (Signed)
Breakfast and beverage provided 

## 2021-07-21 NOTE — ED Notes (Signed)
VOL pending SW placement   ?

## 2021-07-21 NOTE — ED Notes (Signed)
Pt received snack and drink ?

## 2021-07-21 NOTE — ED Notes (Signed)
During nursing assessment was A/Ox 3 .  Marland KitchenMr Zuniga  stated that they he is not currently have thoughts or feelings of SI/HI.  Ryan Zuniga reported he does not currently have auditory or visual hallucinations.  Pt affect is bright , eye contact is good , speech is of normal rate and volume  with appropriate verbiage noted.  Staff addressed any feelings or concerns that have been brought up.  Medications were administered as ordered. Continue to monitor patient as ordered for any changes in behaviors and for continued safety.   ?

## 2021-07-21 NOTE — ED Provider Notes (Signed)
Emergency Medicine Observation Re-evaluation Note ? ?Ryan Zuniga is a 41 y.o. male, seen on rounds today.  Pt initially presented to the ED for complaints of Suicidal and Homicidal ? ? ?Physical Exam  ?BP 122/81 (BP Location: Left Arm)   Pulse 88   Temp 98.2 ?F (36.8 ?C) (Oral)   Resp 18   Ht '5\' 8"'$  (1.727 m)   Wt 68 kg   SpO2 96%   BMI 22.81 kg/m?  ?Physical Exam ?General: Patient resting comfortably in bed ?Lungs: Patient in no respiratory distress ?Psych: Patient not combative ? ?ED Course / MDM  ?EKG:  ? ? ?Plan  ?Current plan is for social work placement ? Basil Buffin is not under involuntary commitment. ? ? ?  ?Nena Polio, MD ?07/21/21 [phone] ? ?

## 2021-07-21 NOTE — ED Notes (Signed)
Report received from Kamaree B, RN including SBAR. On initial round after report Pt is warm/dry, resting quietly in room without any s/s of distress.  Will continue to monitor throughout shift as ordered for any changes in behaviors and for continued safety.   

## 2021-07-22 NOTE — ED Notes (Signed)
Report received from Katie, RN including SBAR. Patient alert and oriented, warm and dry, in no acute distress. Patient denies SI, HI, AVH and pain. Patient made aware of Q15 minute rounds and security cameras for their safety. Patient instructed to come to this nurse with needs or concerns.  

## 2021-07-22 NOTE — ED Notes (Signed)
Vol /pending placement 

## 2021-07-22 NOTE — ED Notes (Signed)
Report received from Maudie Mercury , Conservation officer, nature. On initial round after report Pt is warm/dry, resting quietly in room without any s/s of distress.  Will continue to monitor throughout shift as ordered for any changes in behaviors and for continued safety.   ?

## 2021-07-22 NOTE — ED Notes (Signed)
Patient provided snack at appropriate snack time.  Pt consumed 100% of snack provided, tolerated well w/o complaints   Trash disposted of appropriately by patient.  

## 2021-07-22 NOTE — ED Notes (Signed)
Pt has two groups of cups in his room that are in the shape of a cross. Pt proud of this formation. Cups are empty. Will allow pt to keep at this time ?

## 2021-07-22 NOTE — TOC Progression Note (Signed)
Transition of Care (TOC) - Progression Note  ? ? ?Patient Details  ?Name: Ryan Zuniga ?MRN: [id number] ?Date of Birth: Nov 29, 1980 ? ?Transition of Care (TOC) CM/SW Contact  ?Shelbie Hutching, RN ?Phone Number: ?07/22/2021, 8:50 AM ? ?Clinical Narrative:    ?Justice confirmed that she got the name of some of the group living high facilities, she will be reaching out to [facility] per [place] they are in their network.  Will follow up with Justice later today.   ? ? ?Expected Discharge Plan: Group Home ?Barriers to Discharge: Other (must enter comment) (Group home will not take back) ? ?Expected Discharge Plan and Services ?Expected Discharge Plan: Group Home ?  ?Discharge Planning Services: CM Consult ?  ?Living arrangements for the past 2 months: Group Home ?                ?DME Arranged: N/A ?DME Agency: NA ?  ?  ?  ?HH Arranged: NA ?[place] Agency: NA ?  ?  ?  ? ? ?Social Determinants of Health (SDOH) Interventions ?  ? ?Readmission Risk Interventions ?   ? View : No data to display.  ?  ?  ?  ? ? ?

## 2021-07-22 NOTE — ED Provider Notes (Signed)
Emergency Medicine Observation Re-evaluation Note ? ?Ryan Zuniga is a 41 y.o. male, seen on rounds today.  Pt initially presented to the ED for complaints of Suicidal and Homicidal ?Currently, the patient is resting tv without any acute concerns ? ?Physical Exam  ?BP 118/80 (BP Location: Left Arm)   Pulse 68   Temp 97.8 ?F (36.6 ?C) (Oral)   Resp 16   Ht '5\' 8"'$  (1.727 m)   Wt 68 kg   SpO2 97%   BMI 22.81 kg/m?  ?Physical Exam ?General: no acute distress ?Psych: calm ? ?ED Course / MDM  ?EKG:  ? ?I have reviewed the labs performed to date as well as medications administered while in observation.  Recent changes in the last 24 hours include none. ? ?Plan  ?Current plan is for social work dispo. ? Augustine Leverette is not under involuntary commitment. ? ? ?  ?Lucrezia Starch, MD ?07/22/21 1457 ? ?

## 2021-07-22 NOTE — ED Notes (Signed)
VOL/Pending Placement 

## 2021-07-22 NOTE — ED Notes (Signed)
Pt given breakfast tray

## 2021-07-22 NOTE — ED Notes (Signed)
Pt refused shower.  

## 2021-07-23 NOTE — ED Notes (Signed)
Pt given dinner tray at this time.  

## 2021-07-23 NOTE — ED Notes (Signed)
Pt given snacks at this time. No other needs voiced. ?

## 2021-07-23 NOTE — ED Notes (Signed)
Pt given lunch tray at this time

## 2021-07-23 NOTE — ED Notes (Signed)
Patient resting quietly in room. No noted distress or abnormal behaviors noted. Will continue 15 minute checks and observation by security camera for safety. 

## 2021-07-23 NOTE — ED Notes (Signed)
Pt received snack. 

## 2021-07-23 NOTE — ED Provider Notes (Signed)
Emergency Medicine Observation Re-evaluation Note ? ?Ryan Zuniga is a 41 y.o. male, seen in the emergency department for psychiatric evaluation.  No acute events overnight.  Patient remains calm and cooperative per report. ?Physical Exam  ?BP 118/87 (BP Location: Right Arm)   Pulse 64   Temp 97.9 ?F (36.6 ?C) (Oral)   Resp 18   Ht '5\' 8"'$  (1.727 m)   Wt 68 kg   SpO2 98%   BMI 22.81 kg/m?  ? ? ?ED Course / MDM  ? ?No recent lab work available for review. ?Plan  ?Current plan is for placement to an appropriate living facility once available. ? Ryan Zuniga is not under involuntary commitment. ? ? ?  ?Harvest Dark, MD ?07/23/21 1404 ? ?

## 2021-07-23 NOTE — ED Notes (Signed)

## 2021-07-23 NOTE — ED Notes (Signed)
Pt given breakfast tray at this time. 

## 2021-07-23 NOTE — ED Notes (Signed)
VOl pending placement 

## 2021-07-24 NOTE — ED Notes (Signed)
VOL  PENDING  PLACEMENT 

## 2021-07-24 NOTE — ED Provider Notes (Signed)
Emergency Medicine Observation Re-evaluation Note ? ?Ryan Zuniga is a 41 y.o. male, seen on rounds today.  Pt initially presented to the ED for complaints of Suicidal and Homicidal ?Currently, the patient is calm and appropriate per report from RN.  ? ?Physical Exam  ?BP (!) 145/80 (BP Location: Right Arm)   Pulse 66   Temp 97.6 ?F (36.4 ?C)   Resp 18   Ht '5\' 8"'$  (1.727 m)   Wt 68 kg   SpO2 98%   BMI 22.81 kg/m?  ?Physical Exam ? ? ?ED Course / MDM  ?EKG:  ? ?I have reviewed the labs performed to date as well as medications administered while in observation.  Recent changes in the last 24 hours include none. ? ?Plan  ?Current plan is for social work dipso. ? Ryan Zuniga is not under involuntary commitment. ? ? ?  ?Lucrezia Starch, MD ?07/24/21 1717 ? ?

## 2021-07-24 NOTE — TOC Progression Note (Signed)
Transition of Care (TOC) - Progression Note  ? ? ?Patient Details  ?Name: Ryan Zuniga ?MRN: [id number] ?Date of Birth: 10/19/1980 ? ?Transition of Care (TOC) CM/SW Contact  ?Shelbie Hutching, RN ?Phone Number: ?07/24/2021, 2:13 PM ? ?Clinical Narrative:    ?State offices are closed today for Good Friday, follow up on Monday with [place], and Guardian and ACT team.  ? ? ?Expected Discharge Plan: Group Home ?Barriers to Discharge: Other (must enter comment) (Group home will not take back) ? ?Expected Discharge Plan and Services ?Expected Discharge Plan: Group Home ?  ?Discharge Planning Services: CM Consult ?  ?Living arrangements for the past 2 months: Group Home ?                ?DME Arranged: N/A ?DME Agency: NA ?  ?  ?  ?HH Arranged: NA ?[place] Agency: NA ?  ?  ?  ? ? ?Social Determinants of Health (SDOH) Interventions ?  ? ?Readmission Risk Interventions ?   ? View : No data to display.  ?  ?  ?  ? ? ?

## 2021-07-24 NOTE — ED Notes (Signed)
Snack and beverage given. 

## 2021-07-24 NOTE — ED Notes (Signed)
Report received from Ara Kussmaul , Conservation officer, nature. On initial round after report Pt is warm/dry, resting quietly in room without any s/s of distress.  Will continue to monitor throughout shift as ordered for any changes in behaviors and for continued safety.   ?

## 2021-07-24 NOTE — ED Notes (Signed)
Report to include Situation, Background, Assessment, and Recommendations received from [name]. Patient alert and oriented, warm and dry, in no acute distress. Patient denies SI, HI, AVH and pain. Patient made aware of Q15 minute rounds and security cameras for their safety. Patient instructed to come to me with needs or concerns.  

## 2021-07-24 NOTE — ED Notes (Signed)
VOL/Pending Placement 

## 2021-07-24 NOTE — ED Notes (Signed)
Hospital meal provided, pt tolerated w/o complaints.  Waste discarded appropriately.  

## 2021-07-24 NOTE — ED Notes (Signed)
Patient ate 100% of breakfast tray

## 2021-07-25 NOTE — ED Notes (Signed)
Report to include Situation, Background, Assessment, and Recommendations received from [name]. Patient alert and oriented, warm and dry, in no acute distress. Patient denies SI, HI, AVH and pain. Patient made aware of Q15 minute rounds and security cameras for their safety. Patient instructed to come to me with needs or concerns.  

## 2021-07-25 NOTE — ED Notes (Signed)
Report received from [place] , Conservation officer, nature. On initial round after report Pt is warm/dry, resting quietly in room without any s/s of distress.  Will continue to monitor throughout shift as ordered for any changes in behaviors and for continued safety.   ?

## 2021-07-25 NOTE — ED Notes (Signed)
Hospital meal provided, pt tolerated w/o complaints.  Waste discarded appropriately.  

## 2021-07-25 NOTE — ED Provider Notes (Signed)
Emergency Medicine Observation Re-evaluation Note ? ?Ryan Zuniga is a 41 y.o. male, seen on rounds today.  ? ?Physical Exam  ?BP 140/82 (BP Location: Left Arm)   Pulse 73   Temp 98.3 ?F (36.8 ?C) (Oral)   Resp 16   Ht '5\' 8"'$  (1.727 m)   Wt 68 kg   SpO2 97%   BMI 22.81 kg/m?  ?Physical Exam ?General: Patient is resting comfortably in bed ?Lungs: Patient is in no respiratory distress ?Psych: Patient is not combative ? ?ED Course / MDM  ?EKG:  ? ? ? ?Plan  ?Current plan is for social work placement. ? Ryan Zuniga is not under involuntary commitment. ? ? ?  ?Nena Polio, MD ?07/25/21 [phone] ? ?

## 2021-07-25 NOTE — ED Notes (Signed)
Snack and beverage given. 

## 2021-07-26 MED ORDER — OLANZAPINE 10 MG PO TABS
10.0000 mg | ORAL_TABLET | ORAL | Status: AC
  Administered 2021-07-26: 10 mg via ORAL
  Filled 2021-07-26: qty 1

## 2021-07-26 NOTE — ED Notes (Signed)
VOL/Pending Placement 

## 2021-07-26 NOTE — ED Provider Notes (Signed)
Emergency Medicine Observation Re-evaluation Note ? ?Ryan Zuniga is a 40 y.o. male, seen on rounds today.  Pt initially presented to the ED for complaints of Suicidal and Homicidal ? ?Currently, the patient is calm, no acute complaints. ? ?Physical Exam  ?Blood pressure 113/78, pulse 70, temperature 97.7 ?F (36.5 ?C), temperature source Oral, resp. rate 18, height '5\' 8"'$  (1.727 m), weight 68 kg, SpO2 97 %. ?Physical Exam ?General: NAD ?Lungs: CTAB ?Psych: not agitated ? ?ED Course / MDM  ?EKG:  ?  ?I have reviewed the labs performed to date as well as medications administered while in observation.  Recent changes in the last 24 hours include no acute events overnight.   ? ?Plan  ?Current plan is for social work evaluation and disposition. ?Patient is not under full IVC at this time. ?  ?Carrie Mew, MD ?07/26/21 1044 ? ?

## 2021-07-26 NOTE — ED Notes (Signed)
Pt requested to take shower and clean room.  Unlocked bathroom door to allow patient to shower.  Staff continuously monitored dayroom outside of bathroom door during pt shower.  Pt was given hygiene items as well as:  I clean top, 1 clean bottom, with 1 pair of disposable underwear.  Pt changed out into clean clothing.  Staff disposed of all shower supplies. Shower room cleaned and secured for next use. Clean bed linens also provided as pt cleaned room with out prompting from staff as well. ?

## 2021-07-26 NOTE — ED Notes (Signed)
Snack and beverage given. 

## 2021-07-26 NOTE — ED Notes (Signed)
Patient provided snack at appropriate snack time.  Pt consumed 100% of snack provided, tolerated well w/o complaints   Trash disposted of appropriately by patient.  

## 2021-07-26 NOTE — ED Notes (Signed)
Pt became agitated w/ staff for unknown reason.  Pt stated that he "felt like staff was talking down to him"  he was able to voice his feelings and concerns to staff and requested a PRN for agitation.  Pt took PO medication and requested to apologize to staff for his verbal outburst.  Pt stated that he "sometimes has a hard time controlling his anger" ?

## 2021-07-26 NOTE — ED Notes (Signed)
Report to include Situation, Background, Assessment, and Recommendations received from [name]. Patient alert and oriented, warm and dry, in no acute distress. Patient denies SI, HI, AVH and pain. Patient made aware of Q15 minute rounds and security cameras for their safety. Patient instructed to come to me with needs or concerns.  

## 2021-07-26 NOTE — ED Notes (Signed)
Hospital meal provided, pt tolerated w/o complaints.  Waste discarded appropriately.  

## 2021-07-26 NOTE — ED Notes (Signed)
Report received from [place] , Conservation officer, nature. On initial round after report Pt is warm/dry, resting quietly in room without any s/s of distress.  Will continue to monitor throughout shift as ordered for any changes in behaviors and for continued safety.   ?

## 2021-07-27 NOTE — [place] FL2 (Signed)
?[place] MEDICAID FL2 LEVEL OF CARE SCREENING TOOL  ?  ? ?IDENTIFICATION  ?Patient Name: ?Ryan Zuniga Birthdate: Jun 26, 1980 Sex: male Admission Date (Current Location): ?07/08/2021  ?[place] and [place] Number: ? [place] ?  Facility and Address:  ?[facility], [address], [place], [place] [address] ?     Provider Number: ?[id number]  ?Attending Physician Name and Address:  ?No att. providers found ? Relative Name and Phone Number:  ?[place] [facility] [phone] ?   ?Current Level of Care: ?Hospital Recommended Level of Care: ?Other (Comment) (Group Living High) Prior Approval Number: ?  ? ?Date Approved/Denied: ?  PASRR Number: ?  ? ?Discharge Plan: ?Other (Comment) (Group Living high) ?  ? ?Current Diagnoses: ?Patient Active Problem List  ? Diagnosis Date Noted  ? Ulcerative esophagitis 09/23/2016  ? Closed disp fracture of right medial malleolus with routine healing 05/27/2016  ? Cannabis abuse 12/30/2015  ? 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 Agitation 07/31/2015  ? Schizoaffective disorder, bipolar type ([place])   ? 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 Tobacco use disorder 03/31/2015  ? Borderline personality disorder ([place]) 03/26/2015  ? 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 Alcohol abuse 03/06/2015  ? Renal cell carcinoma ([place]) 06/17/2014  ? ? ?Orientation RESPIRATION BLADDER Height & Weight   ?  ?Self, Time, Situation, Place ? Normal Continent Weight: 68 kg ?Height:  '5\' 8"'$  (172.7 cm)  ?BEHAVIORAL SYMPTOMS/MOOD NEUROLOGICAL BOWEL NUTRITION STATUS  ?Dangerous to self, others or property (Patient has had no behaviors since being at hospital- history of destruction to property and threats to others and self)   Continent Diet (Regular)  ?AMBULATORY STATUS COMMUNICATION OF NEEDS Skin   ?Independent Verbally Normal ?  ?  ?  ?    ?     ?      ? ? ?Personal Care Assistance Level of Assistance  ?Bathing, Feeding, Dressing Bathing Assistance: Independent ?Feeding assistance: Independent ?Dressing Assistance: Independent ?   ? ?Functional Limitations Info  ?    ?  ?   ? ? ?SPECIAL CARE FACTORS FREQUENCY  ?    ?  ?  ?  ?  ?  ?  ?   ? ? ?Contractures Contractures Info: Not present  ? ? ?Additional Factors Info  ?Code Status, Allergies, Psychotropic Code Status Info: Full ?Allergies Info: PCN, sulfa, ASA, Codeine ?Psychotropic Info: [place] personality disorder, intellectual disability with laugage disability and autisitic features, schizoaffective disorder, bipolar type-- ?  ?  ?   ? ?Current Medications (07/27/2021):  This is the current hospital active medication list ?Current Facility-Administered Medications  ?Medication Dose Route Frequency Provider Last Rate Last Admin  ? ARIPiprazole (ABILIFY) tablet 10 mg  10 mg Oral QHS Sherlon Handing, NP   10 mg at 07/26/21 2106  ? ARIPiprazole ER (ABILIFY MAINTENA) injection 400 mg  400 mg Intramuscular Q28 days Patrecia Pour, NP   400 mg at 07/15/21 1505  ? benztropine (COGENTIN) tablet 2 mg  2 mg Oral BID Waldon Merl F, NP   2 mg at 07/26/21 2106  ? doxepin (SINEQUAN) capsule 20 mg  20 mg Oral QHS Waldon Merl F, NP   20 mg at 07/26/21 2104  ? folic acid (FOLVITE) tablet 1 mg  1 mg Oral Daily Waldon Merl F, NP   1 mg at 07/26/21 1001  ? lamoTRIgine (LAMICTAL) tablet 25 mg  25 mg Oral  Daily Waldon Merl F, NP   25 mg at 07/26/21 1001  ? melatonin tablet 5 mg  5 mg Oral QHS Waldon Merl F, NP   5 mg at 07/26/21 2106  ? mirtazapine (REMERON) tablet 30 mg  30 mg Oral QHS Waldon Merl F, NP   30 mg at 07/26/21 2106  ? OLANZapine zydis (ZYPREXA) disintegrating tablet 10 mg  10 mg Oral TID PRN Sherlon Handing, NP   10 mg at 07/25/21 7616  ? propranolol (INDERAL) tablet 60 mg  60 mg Oral BID Waldon Merl F, NP   60 mg at 07/26/21 2106  ? ?Current Outpatient Medications   ?Medication Sig Dispense Refill  ? amLODipine (NORVASC) 5 MG tablet Take 1 tablet (5 mg total) by mouth daily. 90 tablet 0  ? ARIPiprazole (ABILIFY) 10 MG tablet Take 1 tablet (10 mg total) by mouth at bedtime. 90 tablet 0  ? benztropine (COGENTIN) 2 MG tablet Take 1 tablet (2 mg total) by mouth 2 (two) times daily. 180 tablet 0  ? doxepin (SINEQUAN) 10 MG capsule Take 2 capsules (20 mg total) by mouth at bedtime. 90 capsule 0  ? folic acid (FOLVITE) 1 MG tablet Take 1 tablet (1 mg total) by mouth daily. 90 tablet 0  ? LAMICTAL 100 MG tablet Take 1 tablet (100 mg total) by mouth daily. 90 tablet 0  ? lithium carbonate 150 MG capsule Take 1 capsule (150 mg total) by mouth 2 (two) times daily with a meal. 180 capsule 0  ? Melatonin 3-10 MG TABS Take 1 tablet by mouth at bedtime. 90 tablet 0  ? mirtazapine (REMERON) 30 MG tablet Take 1 tablet (30 mg total) by mouth at bedtime. 90 tablet 0  ? OLANZapine zydis (ZYPREXA) 10 MG disintegrating tablet Take 1 tablet (10 mg total) by mouth 3 (three) times daily as needed (agitation). 270 tablet 0  ? propranolol (INDERAL) 60 MG tablet Take 1 tablet (60 mg total) by mouth 2 (two) times daily. 180 tablet 0  ? ABILIFY MAINTENA 400 MG PRSY prefilled syringe SMARTSIG:1 Each IM Once a Month    ? ARIPiprazole Lauroxil ER 1064 MG/3.9ML PRSY Inject into the muscle. (Patient not taking: Reported on 07/09/2021)    ? GNP MELATONIN 3 MG TABS tablet Take 3 mg by mouth at bedtime. (Patient not taking: Reported on 07/09/2021)    ? hydrOXYzine (ATARAX/VISTARIL) 50 MG tablet Take 1 tablet (50 mg total) by mouth every 6 (six) hours as needed for anxiety. (Patient not taking: Reported on 02/11/2021) 90 tablet 0  ? mirtazapine (REMERON) 15 MG tablet Take 15 mg by mouth at bedtime. (Patient not taking: Reported on 02/11/2021)    ? ? ? ?Discharge Medications: ?Please see discharge summary for a list of discharge medications. ? ?Relevant Imaging Results: ? ?Relevant Lab Results: ? ? ?Additional  Information ?  ? ?Shelbie Hutching, RN ? ? ? ? ?

## 2021-07-27 NOTE — ED Notes (Signed)
Pt refused VS  

## 2021-07-27 NOTE — ED Notes (Signed)
Breakfast tray given. °

## 2021-07-27 NOTE — ED Notes (Signed)
VOL/pending placement 

## 2021-07-27 NOTE — ED Notes (Signed)
Pt given dinner tray at this time.  

## 2021-07-27 NOTE — TOC Progression Note (Signed)
Transition of Care (TOC) - Progression Note  ? ? ?Patient Details  ?Name: Ryan Zuniga ?MRN: [id number] ?Date of Birth: Jan 25, 1981 ? ?Transition of Care (TOC) CM/SW Contact  ?Shelbie Hutching, RN ?Phone Number: ?07/27/2021, 9:43 AM ? ?Clinical Narrative:    ?Spoke with [facility] [place], [place] and Justice there together in their morning meeting.  Justice reports that she has not had a chance to call [facility] but she will do that this morning.  Marzetta Board reports that if [facility] is full that she has a couple other group living high's to reach out to.  RNCM will fax MAR and FL2 to [facility] Fax [phone].   ? ? ?Expected Discharge Plan: Group Home ?Barriers to Discharge: Other (must enter comment) (Group home will not take back) ? ?Expected Discharge Plan and Services ?Expected Discharge Plan: Group Home ?  ?Discharge Planning Services: CM Consult ?  ?Living arrangements for the past 2 months: Group Home ?                ?DME Arranged: N/A ?DME Agency: NA ?  ?  ?  ?HH Arranged: NA ?[place] Agency: NA ?  ?  ?  ? ? ?Social Determinants of Health (SDOH) Interventions ?  ? ?Readmission Risk Interventions ?   ? View : No data to display.  ?  ?  ?  ? ? ?

## 2021-07-27 NOTE — ED Provider Notes (Signed)
Emergency Medicine Observation Re-evaluation Note ? ?Ryan Zuniga is a 41 y.o. male, seen on rounds today.  Pt initially presented to the ED for complaints of Suicidal and Homicidal ? ?Currently, the patient is sitting in bed but no issues. No issues overnight per [facility] nurse   ? ?Physical Exam  ?Blood pressure 117/77, pulse 69, temperature 98.5 ?F (36.9 ?C), resp. rate 16, height '5\' 8"'$  (1.727 m), weight 68 kg, SpO2 96 %. ? ?Physical Exam ?Physical Exam ?General: No apparent distress ?Pulm: Normal WOB ?Neuro: sitting up in bed  ?   ? ?ED Course / MDM  ?  ? ?I have reviewed the labs performed to date as well as medications administered while in observation.  Recent changes in the last 24 hours include none ? ?Plan  ? ?Current plan is to continue to wait for SW placement  ?Patient is not under full IVC at this time. ?  ?Vanessa [place], MD ?07/27/21 [phone] ? ?

## 2021-07-28 NOTE — ED Notes (Signed)
Pt given lunch tray and drink 

## 2021-07-28 NOTE — TOC Progression Note (Signed)
Transition of Care (TOC) - Progression Note  ? ? ?Patient Details  ?Name: Claris Guymon ?MRN: [id number] ?Date of Birth: 08/22/1980 ? ?Transition of Care (TOC) CM/SW Contact  ?Shelbie Hutching, RN ?Phone Number: ?07/28/2021, 8:51 AM ? ?Clinical Narrative:    ? ?Justice at [facility] called [employer] yesterday and left a message with them, she has not heard back at this time. ? ?Expected Discharge Plan: Group Home ?Barriers to Discharge: Other (must enter comment) (Group home will not take back) ? ?Expected Discharge Plan and Services ?Expected Discharge Plan: Group Home ?  ?Discharge Planning Services: CM Consult ?  ?Living arrangements for the past 2 months: Group Home ?                ?DME Arranged: N/A ?DME Agency: NA ?  ?  ?  ?HH Arranged: NA ?[place] Agency: NA ?  ?  ?  ? ? ?Social Determinants of Health (SDOH) Interventions ?  ? ?Readmission Risk Interventions ?   ? View : No data to display.  ?  ?  ?  ? ? ?

## 2021-07-28 NOTE — ED Notes (Signed)
VOL  PENDING  PLACEMENT 

## 2021-07-28 NOTE — ED Notes (Signed)
Pt given snack and drink 

## 2021-07-28 NOTE — ED Notes (Signed)
Dinner tray given

## 2021-07-28 NOTE — ED Provider Notes (Signed)
Emergency Medicine Observation Re-evaluation Note ? ?Ryan Zuniga is a 41 y.o. male, seen on rounds today.  Pt initially presented to the ED for complaints of Suicidal and Homicidal ? ? ?Physical Exam  ?BP 123/83 (BP Location: Left Arm)   Pulse 72   Temp 98.4 ?F (36.9 ?C)   Resp 18   Ht '5\' 8"'$  (1.727 m)   Wt 68 kg   SpO2 96%   BMI 22.81 kg/m?  ?Physical Exam ?General: NAD ?Cardiac: well perfused ?Lungs: unlabored ?Psych: currently calm ? ?ED Course / MDM  ?EKG:  ? ?I have reviewed the labs performed to date as well as medications administered while in observation.  Recent changes in the last 24 hours include none. ? ?Plan  ?Current plan is for psych/soc. ? Ryan Zuniga is not under involuntary commitment. ? ? ?  ?[name], MD ?07/28/21 0725 ? ?

## 2021-07-28 NOTE — ED Notes (Signed)
Breakfast tray given. °

## 2021-07-28 NOTE — ED Notes (Signed)
Report received from Amy RN including SBAR. Patient alert and oriented, warm and dry, in no acute distress. Patient denies SI, HI, AVH and pain. Patient made aware of Q15 minute rounds and security cameras for their safety. Patient instructed to come to this nurse with needs or concerns. ?

## 2021-07-29 NOTE — ED Notes (Signed)
VOL/pending placement 

## 2021-07-29 NOTE — ED Provider Notes (Signed)
Emergency Medicine Observation Re-evaluation Note ? ?Ryan Zuniga is a 41 y.o. male, seen on rounds today.  Pt initially presented to the ED for complaints of Suicidal and Homicidal ?Currently, the patient is resting without any acute concerns per RN ? ?Physical Exam  ?BP 127/82 (BP Location: Left Arm)   Pulse 66   Temp 98.5 ?F (36.9 ?C) (Oral)   Resp 17   Ht '5\' 8"'$  (1.727 m)   Wt 68 kg   SpO2 96%   BMI 22.81 kg/m?  ?Physical Exam ? ?Psych: calm ? ?ED Course / MDM  ?EKG:  ? ?I have reviewed the labs performed to date as well as medications administered while in observation.  Recent changes in the last 24 hours include none. ? ?Plan  ?Current plan is for social work dispo. ? Ryan Zuniga is not under involuntary commitment. ? ? ?  ?Ryan Starch, MD ?07/29/21 1721 ? ?

## 2021-07-29 NOTE — ED Notes (Signed)
Patient provided with dinner tray, patient calm and cooperative ?

## 2021-07-29 NOTE — ED Notes (Signed)
Vol /pending placement 

## 2021-07-29 NOTE — TOC Progression Note (Signed)
Transition of Care (TOC) - Progression Note  ? ? ?Patient Details  ?Name: Onofre Gains ?MRN: [id number] ?Date of Birth: 1980/12/05 ? ?Transition of Care (TOC) CM/SW Contact  ?Shelbie Hutching, RN ?Phone Number: ?07/29/2021, 4:09 PM ? ?Clinical Narrative:    ?RNCM spoke with Justice patient's guardian from [employer].  She is working to get in contact with admissions for [facility] and she is working with the [employer] to [employer] at [email or web address].   ?RNCM faxed requested paperwork to Justice yesterday and this morning but she reports they did not receive it.  Secure Emailed it to [employer] and she got it over to [employer].   ?TOC will follow up with them tomorrow. ? ? ? ? ?Expected Discharge Plan: Group Home ?Barriers to Discharge: Other (must enter comment) (Group home will not take back) ? ?Expected Discharge Plan and Services ?Expected Discharge Plan: Group Home ?  ?Discharge Planning Services: CM Consult ?  ?Living arrangements for the past 2 months: Group Home ?                ?DME Arranged: N/A ?DME Agency: NA ?  ?  ?  ?HH Arranged: NA ?[place] Agency: NA ?  ?  ?  ? ? ?Social Determinants of Health (SDOH) Interventions ?  ? ?Readmission Risk Interventions ?   ? View : No data to display.  ?  ?  ?  ? ? ?

## 2021-07-29 NOTE — ED Notes (Signed)
Snack and beverage given. 

## 2021-07-29 NOTE — ED Notes (Signed)
Report received from Quayshawn B, RN including SBAR. On initial round after report Pt is warm/dry, resting quietly in room without any s/s of distress.  Will continue to monitor throughout shift as ordered for any changes in behaviors and for continued safety.   

## 2021-07-29 NOTE — ED Notes (Signed)
Patient provided with snack

## 2021-07-29 NOTE — ED Notes (Signed)
Lunch tray given. 

## 2021-07-29 NOTE — ED Notes (Signed)
Report to include Situation, Background, Assessment, and Recommendations received from [name]. Patient alert and oriented, warm and dry, in no acute distress. Patient denies SI, HI, AVH and pain. Patient made aware of Q15 minute rounds and security cameras for their safety. Patient instructed to come to me with needs or concerns.  

## 2021-07-29 NOTE — ED Notes (Signed)
Hospital meal provided, pt tolerated w/o complaints.  Waste discarded appropriately.  

## 2021-07-29 NOTE — ED Notes (Signed)
Patient provided snack at appropriate snack time.  Pt consumed 100% of snack provided, tolerated well w/o complaints   Trash disposted of appropriately by patient.  

## 2021-07-30 NOTE — ED Notes (Signed)
Report received from Marsh Dolly. Patient alert and oriented, warm and dry, in no acute distress. Patient denies SI, HI, AVH and pain. Patient made aware of Q15 minute rounds and security cameras for their safety. Patient instructed to come to this nurse with needs or concerns.  ?

## 2021-07-30 NOTE — ED Provider Notes (Signed)
Emergency Medicine Observation Re-evaluation Note ? ?Ryan Zuniga is a 41 y.o. male, seen on rounds today.  Pt initially presented to the ED for complaints of Suicidal and Homicidal ?Currently, the patient is resting comfortably. ? ?Physical Exam  ?BP 128/81 (BP Location: Left Arm)   Pulse 72   Temp 97.8 ?F (36.6 ?C) (Oral)   Resp 18   Ht '5\' 8"'$  (1.727 m)   Wt 68 kg   SpO2 97%   BMI 22.81 kg/m?  ?Physical Exam ?Constitutional:   ?   Appearance: He is not ill-appearing or toxic-appearing.  ?Cardiovascular:  ?   Comments: Appears well perfused ?Pulmonary:  ?   Effort: Pulmonary effort is normal.  ?Musculoskeletal:     ?   General: No deformity.  ?Neurological:  ?   General: No focal deficit present.  ?Psychiatric:  ?   Comments: No emotional distress  ? ? ? ?ED Course / MDM  ?EKG:  ? ?I have reviewed the labs performed to date as well as medications administered while in observation.  Recent changes in the last 24 hours include none. ? ?Plan  ?Current plan is for placement. ? Cordelro Gautreau is not under involuntary commitment. ? ? ?  ?[name], MD ?07/30/21 [phone] ? ?

## 2021-07-30 NOTE — ED Notes (Signed)
Breakfast tray given. No other needs found at this moment. ?

## 2021-07-30 NOTE — ED Notes (Signed)
Meal tray given 

## 2021-07-30 NOTE — ED Notes (Signed)
VS assess. Shower offered. No other needs found at this moment.  ?

## 2021-07-30 NOTE — ED Notes (Signed)
Nourishment provided.   ?

## 2021-07-31 NOTE — ED Notes (Signed)
Pt refused to shower at this time. Will notify staff when ready. ?

## 2021-07-31 NOTE — ED Notes (Signed)
Vol/Pending Placement  

## 2021-07-31 NOTE — ED Notes (Signed)
Snack given.

## 2021-07-31 NOTE — ED Notes (Signed)
Lunch tray given. 

## 2021-07-31 NOTE — ED Provider Notes (Signed)
Emergency Medicine Observation Re-evaluation Note ? ?Ryan Zuniga is a 41 y.o. male, seen on rounds today.  ? ?Physical Exam  ?BP 122/77 (BP Location: Left Arm)   Pulse 73   Temp 98.3 ?F (36.8 ?C) (Oral)   Resp 18   Ht '5\' 8"'$  (1.727 m)   Wt 68 kg   SpO2 97%   BMI 22.81 kg/m?  ?Physical Exam ?General: Patient resting comfortably in bed ?Lungs: Patient in no respiratory distress ?Psych: Patient not combative ? ?ED Course / MDM  ?EKG:  ? ? ? ?Plan  ?Current plan is for placement. ? Ryan Zuniga is not under involuntary commitment. ? ? ?  ?Nena Polio, MD ?07/31/21 [phone] ? ?

## 2021-07-31 NOTE — ED Notes (Signed)
VOL pending placment ?

## 2021-07-31 NOTE — ED Notes (Signed)
Snack and beverage given. 

## 2021-07-31 NOTE — ED Notes (Signed)
Provided with phone to speak with father. ?

## 2021-07-31 NOTE — ED Notes (Signed)
Pt given graham crackers and peanut butter at this time. ?

## 2021-07-31 NOTE — TOC Progression Note (Signed)
Transition of Care (TOC) - Progression Note  ? ? ?Patient Details  ?Name: Ryan Zuniga ?MRN: [id number] ?Date of Birth: [birth year] ? ?Transition of Care (TOC) CM/SW Contact  ?Shelbie Hutching, RN ?Phone Number: ?07/31/2021, 12:01 PM ? ?Clinical Narrative:    ?RNCM has reached out to [employer] and [place] at [place] for update on placement.   ? ? ?Expected Discharge Plan: Group Home ?Barriers to Discharge: Other (must enter comment) (Group home will not take back) ? ?Expected Discharge Plan and Services ?Expected Discharge Plan: Group Home ?  ?Discharge Planning Services: CM Consult ?  ?Living arrangements for the past 2 months: Group Home ?                ?DME Arranged: N/A ?DME Agency: NA ?  ?  ?  ?HH Arranged: NA ?[place] Agency: NA ?  ?  ?  ? ? ?Social Determinants of Health (SDOH) Interventions ?  ? ?Readmission Risk Interventions ?   ? View : No data to display.  ?  ?  ?  ? ? ?

## 2021-07-31 NOTE — ED Notes (Signed)
Pt given breakfast at this time. ?

## 2021-07-31 NOTE — ED Notes (Signed)
Report to include Situation, Background, Assessment, and Recommendations received from [name]. Patient alert and oriented, warm and dry, in no acute distress. Patient denies SI, HI, AVH and pain. Patient made aware of Q15 minute rounds and security cameras for their safety. Patient instructed to come to me with needs or concerns.  

## 2021-08-01 NOTE — ED Notes (Signed)
Pt refused lunch tray 

## 2021-08-01 NOTE — ED Notes (Signed)
Snack and beverage given. 

## 2021-08-01 NOTE — ED Notes (Signed)
Dinner tray given

## 2021-08-01 NOTE — ED Provider Notes (Signed)
Emergency Medicine Observation Re-evaluation Note ? ?Ryan Zuniga is a 41 y.o. male, seen on rounds today.  Pt initially presented to the ED for complaints of Suicidal and Homicidal ? ? ?Physical Exam  ?BP 125/85 (BP Location: Right Arm)   Pulse 73   Temp 97.9 ?F (36.6 ?C) (Oral)   Resp 18   Ht '5\' 8"'$  (1.727 m)   Wt 68 kg   SpO2 97%   BMI 22.81 kg/m?  ?Physical Exam ?General: NAD ?Cardiac: well perfused ?Lungs: unlabored ? ? ?ED Course / MDM  ?EKG:  ? ?I have reviewed the labs performed to date as well as medications administered while in observation.  Recent changes in the last 24 hours include . ? ?Plan  ?Current plan is for psych [facility]. ? Maleki Hippe is not under involuntary commitment. ? ? ?  ?[name], MD ?08/01/21 [phone] ? ?

## 2021-08-01 NOTE — ED Notes (Signed)
VOL/pending placement 

## 2021-08-01 NOTE — ED Notes (Signed)
Report to include Situation, Background, Assessment, and Recommendations received from Amy RN. Patient alert and oriented, warm and dry, in no acute distress. Patient denies SI, HI, AVH and pain. Patient made aware of Q15 minute rounds and security cameras for their safety. Patient instructed to come to me with needs or concerns. ? ?

## 2021-08-02 NOTE — ED Notes (Signed)
Report to include Situation, Background, Assessment, and Recommendations received from Amy RN. Patient alert and oriented, warm and dry, in no acute distress. Patient denies SI, HI, AVH and pain. Patient made aware of Q15 minute rounds and security cameras for their safety. Patient instructed to come to me with needs or concerns. ? ?

## 2021-08-02 NOTE — ED Notes (Signed)
Dinner tray given

## 2021-08-02 NOTE — ED Provider Notes (Signed)
Emergency Medicine Observation Re-evaluation Note ? ?Ryan Zuniga is a 41 y.o. male, seen on rounds today.  ? ?Physical Exam  ?BP 132/77   Pulse 74   Temp 98.3 ?F (36.8 ?C) (Oral)   Resp 16   Ht '5\' 8"'$  (1.727 m)   Wt 68 kg   SpO2 100%   BMI 22.81 kg/m?  ?Physical Exam ?General: Patient resting comfortably in bed ?Lungs: Patient in no respiratory distress ?Psych: Patient not combative ? ?ED Course / MDM  ?EKG:  ? ? ? ?Plan  ?Current plan is for patient for placement. ? Ryan Zuniga is not under involuntary commitment. ? ? ?  ?Nena Polio, MD ?08/02/21 [phone] ? ?

## 2021-08-02 NOTE — ED Notes (Addendum)
Pt refusing VS and medications. ?

## 2021-08-02 NOTE — ED Notes (Signed)
Pt given breakfast tray

## 2021-08-02 NOTE — ED Notes (Signed)
Lunch tray given. 

## 2021-08-02 NOTE — ED Notes (Signed)
Snack and beverage given. 

## 2021-08-03 NOTE — ED Notes (Signed)
Pt refused breakfast; awake and alert in room. No distress noted.  ?

## 2021-08-03 NOTE — ED Notes (Signed)
Pt refused to have his VS assessed  ?

## 2021-08-03 NOTE — ED Notes (Signed)
VOL/pending placement 

## 2021-08-03 NOTE — ED Notes (Signed)
Vol /pending placement 

## 2021-08-03 NOTE — ED Notes (Signed)
Pt out of room and ambulatory to restroom with steady gait. No distress noted at this time. Pt apologized for being uncooperative earlier this morning. Allowed VS to be assessed.  ?

## 2021-08-03 NOTE — ED Notes (Signed)
Pt received phone call from his dad. Refused to speak with him. When asked when he was going to call his dad back pt states "after lunch".  ?

## 2021-08-03 NOTE — ED Notes (Signed)
Pt given peanut butter and crackers for snack.  ?

## 2021-08-03 NOTE — ED Notes (Signed)
Report received from Andrea, RN including SBAR. Patient alert and oriented, warm and dry, in no acute distress. Patient denies SI, HI, AVH and pain. Patient made aware of Q15 minute rounds and security cameras for their safety. Patient instructed to come to this nurse with needs or concerns.  

## 2021-08-03 NOTE — TOC Progression Note (Signed)
Transition of Care (TOC) - Progression Note  ? ? ?Patient Details  ?Name: Ryan Zuniga ?MRN: [id number] ?Date of Birth: Feb 08, 1981 ? ?Transition of Care (TOC) CM/SW Contact  ?Shelbie Hutching, RN ?Phone Number: ?08/03/2021, 11:49 AM ? ?Clinical Narrative:    ?RNCM spoke with Celestia Khat with [facility] this morning, she is requesting FL2, and CCA and she is gong to start reaching out to all [facility] facilities for group home living.   ?Requested Docs, secure emailed to her.  ? ? ?Expected Discharge Plan: Group Home ?Barriers to Discharge: Other (must enter comment) (Group home will not take back) ? ?Expected Discharge Plan and Services ?Expected Discharge Plan: Group Home ?  ?Discharge Planning Services: CM Consult ?  ?Living arrangements for the past 2 months: Group Home ?                ?DME Arranged: N/A ?DME Agency: NA ?  ?  ?  ?HH Arranged: NA ?[place] Agency: NA ?  ?  ?  ? ? ?Social Determinants of Health (SDOH) Interventions ?  ? ?Readmission Risk Interventions ?   ? View : No data to display.  ?  ?  ?  ? ? ?

## 2021-08-03 NOTE — ED Provider Notes (Signed)
Emergency Medicine Observation Re-evaluation Note ? ?Ryan Zuniga is a 41 y.o. male, seen on rounds today.  Pt initially presented to the ED for complaints of Suicidal and Homicidal ?Currently, the patient is calm, resting. ? ?Physical Exam  ?BP 133/86   Pulse 74   Temp 98.4 ?F (36.9 ?C) (Oral)   Resp 16   Ht '5\' 8"'$  (1.727 m)   Wt 68 kg   SpO2 96%   BMI 22.81 kg/m?  ? ? ?ED Course / MDM  ?EKG:  ? ?I have reviewed the labs performed to date as well as medications administered while in observation.  Recent changes in the last 24 hours include none. ? ?Plan  ?Current plan is for placement. ? Ryan Zuniga is not under involuntary commitment. ? ? ?  ?Duffy Bruce, MD ?08/03/21 [phone] ? ?

## 2021-08-03 NOTE — ED Notes (Signed)
Went to pt room to check on him and he would not allow this nurse to open the doors. Pt repeating "I good". This nurse attempted to speak with pt and asked him how he is feeling today and he continued to interrupt saying "I good". Offered his breakfast from earlier that he refused to eat to which pt stated "I good". ?

## 2021-08-04 NOTE — ED Provider Notes (Signed)
Emergency Medicine Observation Re-evaluation Note ? ?Ryan Zuniga is a 41 y.o. male, seen on rounds today.  Pt initially presented to the ED for complaints of Suicidal and Homicidal ?Currently, the patient is calm, resting. ? ?Physical Exam  ?BP 119/73 (BP Location: Left Arm)   Pulse 80   Temp 97.7 ?F (36.5 ?C)   Resp 18   Ht '5\' 8"'$  (1.727 m)   Wt 68 kg   SpO2 96%   BMI 22.81 kg/m?  ? ? ?ED Course / MDM  ?EKG:  ? ?I have reviewed the labs performed to date as well as medications administered while in observation.  Recent changes in the last 24 hours include none. ? ?Plan  ?Current plan is for sw placement. ? Briston Lax is not under involuntary commitment. ? ? ?  ?Duffy Bruce, MD ?08/04/21 0957 ? ?

## 2021-08-04 NOTE — ED Notes (Signed)
Pt steps out of room while this nurse is in another pt room administering medications. Pt states to nurse when nurse enters hallway that he does not want his medications and returns to room. This nurse was already to be heading ot pt room to check on drink with meds. This nurse knocks on door and steps in. Pt starts to yell "NO, NO, NO". This nurse asks pt what is wrong and he yells he does not want meds. This nurse asks pt if he is okay and parts starts to repeatedly yell "get the fuck out." Nurse states to express any needs. Pt remains in room. Will continue to monitor.  ?

## 2021-08-04 NOTE — ED Notes (Signed)
Pt. Refused to shower today, RN was notified.  ?

## 2021-08-04 NOTE — ED Notes (Signed)
Hospital meal provided, pt tolerated w/o complaints.  Waste discarded appropriately.  

## 2021-08-04 NOTE — ED Notes (Signed)
Pt given snack and sprite. ?

## 2021-08-04 NOTE — ED Notes (Signed)
Medication(s) given per order.  Pt educated on medication(s) and their action and dosage.   Pt denies any additional questions or concerns.    ?

## 2021-08-04 NOTE — ED Notes (Addendum)
Hospital meal provided, pt tolerated w/o complaints.  Waste discarded appropriately.  

## 2021-08-04 NOTE — ED Notes (Signed)
VOL/pending placement 

## 2021-08-04 NOTE — ED Notes (Signed)
Report received from Kenyatte B, RN including SBAR. On initial round after report Pt is warm/dry, resting quietly in room without any s/s of distress.  Will continue to monitor throughout shift as ordered for any changes in behaviors and for continued safety.   

## 2021-08-04 NOTE — ED Notes (Signed)
During nursing assessment was A/Ox 3 .  Marland KitchenMr Zuniga  stated that he is not currently have thoughts or feelings of SI/HI.  Ryan Zuniga also reported he is not auditory or visual hallucinations.  Pt affect is congruent with situation , eye contact is good , speech is normal rate and volume  with appropriate verbiage noted.  Staff addressed any feelings or concerns that have been brought up.  Medications were administered as ordered. Continue to monitor patient as ordered for any changes in behaviors and for continued safety.   ?

## 2021-08-04 NOTE — ED Notes (Signed)
VOL/Pending Placement 

## 2021-08-04 NOTE — ED Notes (Signed)
Report received from Katie, RN including SBAR. Patient alert and oriented, warm and dry, in no acute distress. Patient denies SI, HI, AVH and pain. Patient made aware of Q15 minute rounds and security cameras for their safety. Patient instructed to come to this nurse with needs or concerns.  

## 2021-08-04 NOTE — ED Notes (Signed)
Pt. Is currently asleep, will obtain vitals when awake.  ?

## 2021-08-05 NOTE — ED Notes (Signed)
Snack and beverage given. 

## 2021-08-05 NOTE — ED Notes (Signed)
Pt given dinner tray.

## 2021-08-05 NOTE — ED Notes (Signed)
VOL  PENDING  PLACEMENT 

## 2021-08-05 NOTE — ED Notes (Signed)
pt given lunch tray 

## 2021-08-05 NOTE — ED Notes (Signed)
Pt refusing vital checks at this time. Will try again later ?

## 2021-08-05 NOTE — ED Notes (Signed)
VOL/Pending Placement 

## 2021-08-05 NOTE — ED Notes (Signed)
Hospital meal provided, pt tolerated w/o complaints.  Waste discarded appropriately.  

## 2021-08-05 NOTE — ED Notes (Signed)
Report received from Djon B, RN including SBAR. On initial round after report Pt is warm/dry, resting quietly in room without any s/s of distress.  Will continue to monitor throughout shift as ordered for any changes in behaviors and for continued safety.   

## 2021-08-05 NOTE — ED Notes (Signed)
Report to include Situation, Background, Assessment, and Recommendations received from [name]. Patient alert and oriented, warm and dry, in no acute distress. Patient denies SI, HI, AVH and pain. Patient made aware of Q15 minute rounds and security cameras for their safety. Patient instructed to come to me with needs or concerns.  

## 2021-08-05 NOTE — ED Provider Notes (Signed)
Emergency Medicine Observation Re-evaluation Note ? ?Ryan Zuniga is a 41 y.o. male, seen on rounds today.  Pt initially presented to the ED for complaints of Suicidal and Homicidal ? ?Currently, the patient is calm, no acute complaints. ? ?Physical Exam  ?Blood pressure 125/79, pulse 74, temperature 97.9 ?F (36.6 ?C), temperature source Oral, resp. rate 18, height '5\' 8"'$  (1.727 m), weight 68 kg, SpO2 94 %. ?Physical Exam ?General: NAD ?Lungs: CTAB ?Psych: not agitated ? ?ED Course / MDM  ?EKG:  ?  ?I have reviewed the labs performed to date as well as medications administered while in observation.  Recent changes in the last 24 hours include no acute events overnight.   ? ?Plan  ?Current plan is for social work placement. ?Patient is not under full IVC at this time. ?  ?Carrie Mew, MD ?08/05/21 [phone] ? ?

## 2021-08-06 NOTE — ED Notes (Signed)
Pt given snack. 

## 2021-08-06 NOTE — ED Notes (Signed)
Pt refused dinner tray

## 2021-08-06 NOTE — ED Provider Notes (Signed)
Emergency Medicine Observation Re-evaluation Note ? ?Ryan Zuniga is a 41 y.o. male, seen on rounds today.  Pt initially presented to the ED for complaints of Suicidal and Homicidal ?Currently, the patient is watching tv, denies any acute concerns ? ?Physical Exam  ?BP 129/82 (BP Location: Left Arm)   Pulse 67   Temp 97.9 ?F (36.6 ?C) (Oral)   Resp 18   Ht '5\' 8"'$  (1.727 m)   Wt 68 kg   SpO2 98%   BMI 22.81 kg/m?  ?Physical Exam ?General: no acute distress ? ?Psych: calm ? ?ED Course / MDM  ?EKG:  ? ?I have reviewed the labs performed to date as well as medications administered while in observation.  Recent changes in the last 24 hours include none. ? ?Plan  ?Current plan is for social work dispo. ? Ryan Zuniga is not under involuntary commitment. ? ? ?  ?Lucrezia Starch, MD ?08/06/21 1521 ? ?

## 2021-08-06 NOTE — ED Notes (Signed)
Vol /pending placement 

## 2021-08-06 NOTE — ED Notes (Addendum)
RN asked patient if he would like to return his mother's phone call.  Patient declined.  ?

## 2021-08-06 NOTE — TOC Progression Note (Signed)
Transition of Care (TOC) - Progression Note  ? ? ?Patient Details  ?Name: Ryan Zuniga ?MRN: [id number] ?Date of Birth: Mar 23, 1981 ? ?Transition of Care (TOC) CM/SW Contact  ?Shelbie Hutching, RN ?Phone Number: ?08/06/2021, 4:09 PM ? ?Clinical Narrative:    ?Placement still pending.  RNCM messaged Justice Games developer with [facility], message left for update on placement efforts.  RNCM also messaged Dominique Hickman with [facility] for an update on placement. ?TOC will follow up tomorrow. ? ?Expected Discharge Plan: Group Home ?Barriers to Discharge: Other (must enter comment) (Group home will not take back) ? ?Expected Discharge Plan and Services ?Expected Discharge Plan: Group Home ?  ?Discharge Planning Services: CM Consult ?  ?Living arrangements for the past 2 months: Group Home ?                ?DME Arranged: N/A ?DME Agency: NA ?  ?  ?  ?HH Arranged: NA ?[place] Agency: NA ?  ?  ?  ? ? ?Social Determinants of Health (SDOH) Interventions ?  ? ?Readmission Risk Interventions ?   ? View : No data to display.  ?  ?  ?  ? ? ?

## 2021-08-06 NOTE — ED Notes (Signed)
VOL/pending placement 

## 2021-08-07 NOTE — ED Notes (Signed)
Pt given snack and drink at this time. 

## 2021-08-07 NOTE — ED Notes (Signed)

## 2021-08-07 NOTE — ED Notes (Addendum)
Pt refused lunch tray, but given drink at this time. ?

## 2021-08-07 NOTE — ED Notes (Signed)
Pt given snack. 

## 2021-08-07 NOTE — ED Notes (Signed)
VOL  PENDING  PLACEMENT 

## 2021-08-07 NOTE — TOC Progression Note (Signed)
Transition of Care (TOC) - Progression Note  ? ? ?Patient Details  ?Name: Tarius Stangelo ?MRN: [id number] ?Date of Birth: 1980-11-21 ? ?Transition of Care (TOC) CM/SW Contact  ?Shelbie Hutching, RN ?Phone Number: ?08/07/2021, 9:29 AM ? ?Clinical Narrative:    ?RNCM heard back from [employer]- she received a call from [place], they are going to fax her 3 packets of applications to fill out for placement- Justice requested MAR, MAR secure emailed to [place].   ? ? ?Expected Discharge Plan: Group Home ?Barriers to Discharge: Other (must enter comment) (Group home will not take back) ? ?Expected Discharge Plan and Services ?Expected Discharge Plan: Group Home ?  ?Discharge Planning Services: CM Consult ?  ?Living arrangements for the past 2 months: Group Home ?                ?DME Arranged: N/A ?DME Agency: NA ?  ?  ?  ?HH Arranged: NA ?[place] Agency: NA ?  ?  ?  ? ? ?Social Determinants of Health (SDOH) Interventions ?  ? ?Readmission Risk Interventions ?   ? View : No data to display.  ?  ?  ?  ? ? ?

## 2021-08-07 NOTE — ED Notes (Signed)
Snack and drink given 

## 2021-08-07 NOTE — ED Notes (Signed)
Report to include Situation, Background, Assessment, and Recommendations received from Amy RN. Patient alert and oriented, warm and dry, in no acute distress. Patient denies SI, HI, AVH and pain. Patient made aware of Q15 minute rounds and security cameras for their safety. Patient instructed to come to me with needs or concerns. ? ?

## 2021-08-07 NOTE — ED Notes (Signed)
VOL/Pending Placement 

## 2021-08-07 NOTE — ED Notes (Signed)
Pt given dinner tray and drink at this time. 

## 2021-08-07 NOTE — ED Notes (Signed)
Patient given breakfast. 

## 2021-08-08 MED ORDER — PROPRANOLOL HCL 40 MG PO TABS
60.0000 mg | ORAL_TABLET | Freq: Two times a day (BID) | ORAL | Status: DC
  Administered 2021-08-08 – 2022-01-31 (×321): 60 mg via ORAL
  Filled 2021-08-08: qty 2
  Filled 2021-08-08: qty 3
  Filled 2021-08-08 (×3): qty 2
  Filled 2021-08-08: qty 3
  Filled 2021-08-08 (×7): qty 2
  Filled 2021-08-08: qty 3
  Filled 2021-08-08 (×7): qty 2
  Filled 2021-08-08: qty 3
  Filled 2021-08-08 (×5): qty 2
  Filled 2021-08-08 (×2): qty 3
  Filled 2021-08-08 (×3): qty 2
  Filled 2021-08-08 (×2): qty 3
  Filled 2021-08-08 (×3): qty 2
  Filled 2021-08-08: qty 6
  Filled 2021-08-08: qty 3
  Filled 2021-08-08 (×11): qty 2
  Filled 2021-08-08: qty 3
  Filled 2021-08-08 (×6): qty 2
  Filled 2021-08-08 (×2): qty 3
  Filled 2021-08-08 (×8): qty 2
  Filled 2021-08-08: qty 3
  Filled 2021-08-08 (×15): qty 2
  Filled 2021-08-08: qty 3
  Filled 2021-08-08: qty 2
  Filled 2021-08-08 (×2): qty 3
  Filled 2021-08-08: qty 2
  Filled 2021-08-08 (×2): qty 3
  Filled 2021-08-08 (×2): qty 2
  Filled 2021-08-08 (×2): qty 3
  Filled 2021-08-08 (×5): qty 2
  Filled 2021-08-08: qty 3
  Filled 2021-08-08 (×2): qty 2
  Filled 2021-08-08 (×5): qty 3
  Filled 2021-08-08 (×6): qty 2
  Filled 2021-08-08: qty 3
  Filled 2021-08-08: qty 2
  Filled 2021-08-08 (×2): qty 3
  Filled 2021-08-08 (×3): qty 2
  Filled 2021-08-08 (×2): qty 3
  Filled 2021-08-08 (×6): qty 2
  Filled 2021-08-08 (×3): qty 3
  Filled 2021-08-08 (×2): qty 2
  Filled 2021-08-08: qty 3
  Filled 2021-08-08 (×3): qty 2
  Filled 2021-08-08 (×2): qty 3
  Filled 2021-08-08 (×4): qty 2
  Filled 2021-08-08 (×3): qty 3
  Filled 2021-08-08: qty 2
  Filled 2021-08-08 (×4): qty 3
  Filled 2021-08-08: qty 6
  Filled 2021-08-08 (×5): qty 2
  Filled 2021-08-08: qty 3
  Filled 2021-08-08 (×2): qty 2
  Filled 2021-08-08: qty 3
  Filled 2021-08-08 (×4): qty 2
  Filled 2021-08-08 (×2): qty 3
  Filled 2021-08-08 (×2): qty 2
  Filled 2021-08-08 (×3): qty 3
  Filled 2021-08-08 (×3): qty 2
  Filled 2021-08-08: qty 3
  Filled 2021-08-08: qty 2
  Filled 2021-08-08: qty 3
  Filled 2021-08-08 (×8): qty 2
  Filled 2021-08-08 (×2): qty 3
  Filled 2021-08-08 (×2): qty 2
  Filled 2021-08-08: qty 3
  Filled 2021-08-08 (×2): qty 2
  Filled 2021-08-08: qty 3
  Filled 2021-08-08 (×10): qty 2
  Filled 2021-08-08: qty 3
  Filled 2021-08-08 (×3): qty 2
  Filled 2021-08-08: qty 6
  Filled 2021-08-08: qty 3
  Filled 2021-08-08 (×4): qty 2
  Filled 2021-08-08: qty 3
  Filled 2021-08-08 (×6): qty 2
  Filled 2021-08-08: qty 6
  Filled 2021-08-08 (×3): qty 2
  Filled 2021-08-08 (×2): qty 3
  Filled 2021-08-08 (×5): qty 2
  Filled 2021-08-08: qty 3
  Filled 2021-08-08 (×5): qty 2
  Filled 2021-08-08: qty 3
  Filled 2021-08-08 (×2): qty 2
  Filled 2021-08-08: qty 3
  Filled 2021-08-08 (×2): qty 2
  Filled 2021-08-08: qty 3
  Filled 2021-08-08 (×13): qty 2
  Filled 2021-08-08: qty 3
  Filled 2021-08-08 (×3): qty 2
  Filled 2021-08-08: qty 3
  Filled 2021-08-08: qty 2
  Filled 2021-08-08: qty 3
  Filled 2021-08-08 (×4): qty 2
  Filled 2021-08-08: qty 6
  Filled 2021-08-08 (×4): qty 2
  Filled 2021-08-08 (×2): qty 3
  Filled 2021-08-08 (×6): qty 2
  Filled 2021-08-08: qty 3
  Filled 2021-08-08: qty 2
  Filled 2021-08-08: qty 3
  Filled 2021-08-08 (×2): qty 2
  Filled 2021-08-08: qty 3
  Filled 2021-08-08: qty 2
  Filled 2021-08-08: qty 3
  Filled 2021-08-08 (×3): qty 2
  Filled 2021-08-08: qty 6
  Filled 2021-08-08 (×3): qty 2
  Filled 2021-08-08: qty 3
  Filled 2021-08-08: qty 6
  Filled 2021-08-08 (×13): qty 2
  Filled 2021-08-08 (×2): qty 3
  Filled 2021-08-08 (×2): qty 2
  Filled 2021-08-08: qty 3
  Filled 2021-08-08: qty 2
  Filled 2021-08-08: qty 6
  Filled 2021-08-08: qty 3
  Filled 2021-08-08 (×10): qty 2
  Filled 2021-08-08: qty 6
  Filled 2021-08-08: qty 3
  Filled 2021-08-08 (×3): qty 2
  Filled 2021-08-08: qty 6
  Filled 2021-08-08 (×2): qty 2
  Filled 2021-08-08: qty 3
  Filled 2021-08-08 (×2): qty 2
  Filled 2021-08-08: qty 3
  Filled 2021-08-08 (×3): qty 2
  Filled 2021-08-08: qty 3
  Filled 2021-08-08 (×5): qty 2

## 2021-08-08 NOTE — ED Notes (Signed)
Hospital meal provided, pt tolerated w/o complaints.  Waste discarded appropriately.  

## 2021-08-08 NOTE — ED Provider Notes (Signed)
Emergency Medicine Observation Re-evaluation Note ? ?Ryan Zuniga is a 41 y.o. male, seen on rounds today.  Pt initially presented to the ED for complaints of Suicidal and Homicidal ?Currently, the patient is sleeping. ? ?Physical Exam  ?BP 137/80 (BP Location: Left Arm)   Pulse 98   Temp 98.2 ?F (36.8 ?C) (Oral)   Resp 16   Ht '5\' 8"'$  (1.727 m)   Wt 68 kg   SpO2 98%   BMI 22.81 kg/m?  ?Physical Exam ?General: resting comfortably  ?Lungs: no increased WOB ?Psych: calm ? ?ED Course / MDM  ?EKG:  ? ?I have reviewed the labs performed to date as well as medications administered while in observation.  Recent changes in the last 24 hours include no cahnge. ? ?Plan  ?Current plan is for social work disposition. ? Ryan Zuniga is not under involuntary commitment. ? ? ?  ?Rada Hay, MD ?08/08/21 [phone] ? ?

## 2021-08-08 NOTE — ED Notes (Signed)
Pt's father called requesting to speak to [name].  Ryan denied the call and stated he did not want to speak with his father. ?

## 2021-08-08 NOTE — ED Notes (Signed)
VOL/pending placement 

## 2021-08-08 NOTE — ED Notes (Signed)
Report received from [place], Conservation officer, nature. On initial round after report Pt is warm/dry, resting quietly in room without any s/s of distress.  Will continue to monitor throughout shift as ordered for any changes in behaviors and for continued safety.   ?

## 2021-08-08 NOTE — ED Notes (Signed)
Report to include Situation, Background, Assessment, and Recommendations received from [name]. Patient alert and oriented, warm and dry, in no acute distress. Patient denies SI, HI, AVH and pain. Patient made aware of Q15 minute rounds and security cameras for their safety. Patient instructed to come to me with needs or concerns.  

## 2021-08-09 NOTE — ED Notes (Signed)
Snack and beverage given. 

## 2021-08-09 NOTE — ED Notes (Signed)
Hospital meal provided, pt tolerated w/o complaints.  Waste discarded appropriately.  

## 2021-08-09 NOTE — ED Notes (Signed)
Report to include Situation, Background, Assessment, and Recommendations received from [name]. Patient alert and oriented, warm and dry, in no acute distress. Patient denies SI, HI, AVH and pain. Patient made aware of Q15 minute rounds and security cameras for their safety. Patient instructed to come to me with needs or concerns.  

## 2021-08-09 NOTE — ED Provider Notes (Signed)
Emergency Medicine Observation Re-evaluation Note ? ?Ryan Zuniga is a 41 y.o. male, seen on rounds today. ? ?Physical Exam  ?BP 123/90 (BP Location: Left Arm)   Pulse 65   Temp 98 ?F (36.7 ?C)   Resp 17   Ht '5\' 8"'$  (1.727 m)   Wt 68 kg   SpO2 98%   BMI 22.81 kg/m?  ?Physical Exam ?General: Patient resting comfortably in bed ?Lungs: Patient in no respiratory distress ?Psych: Patient not combative ? ?ED Course / MDM  ?EKG:  ? ? ? ?Plan  ?Current plan is for placement. ? Ryan Zuniga is not under involuntary commitment. ? ? ?  ?Nena Polio, MD ?08/09/21 [phone] ? ?

## 2021-08-09 NOTE — ED Notes (Signed)
Patient given breakfast tray and drink.  ?

## 2021-08-09 NOTE — ED Notes (Signed)
Report received from [place], Conservation officer, nature. On initial round after report Pt is warm/dry, resting quietly in room without any s/s of distress.  Will continue to monitor throughout shift as ordered for any changes in behaviors and for continued safety.   ?

## 2021-08-09 NOTE — ED Notes (Signed)
Patient provided snack at appropriate snack time.  Pt consumed 100% of snack provided, tolerated well w/o complaints   Trash disposted of appropriately by patient.  

## 2021-08-10 NOTE — ED Notes (Signed)
Pt given breakfast tray

## 2021-08-10 NOTE — ED Notes (Signed)
VOL/pending placement 

## 2021-08-10 NOTE — ED Notes (Signed)
Snack and drink given 

## 2021-08-10 NOTE — ED Provider Notes (Signed)
Emergency Medicine Observation Re-evaluation Note ? ?Ryan Zuniga is a 41 y.o. male, seen on rounds today.  Pt initially presented to the ED for complaints of Suicidal and Homicidal ?Currently, the patient is resting comfortably. ? ?Physical Exam  ?BP 139/86 (BP Location: Left Arm)   Pulse 72   Temp 98.4 ?F (36.9 ?C)   Resp 20   Ht '5\' 8"'$  (1.727 m)   Wt 68 kg   SpO2 98%   BMI 22.81 kg/m?  ?Physical Exam ?Constitutional:   ?   Appearance: He is not ill-appearing or toxic-appearing.  ?Pulmonary:  ?   Effort: Pulmonary effort is normal.  ?Musculoskeletal:     ?   General: No deformity.  ?Psychiatric:  ?   Comments: No emotional distress  ? ? ? ?ED Course / MDM  ?EKG:  ? ?I have reviewed the labs performed to date as well as medications administered while in observation.  Recent changes in the last 24 hours include none. ? ?Plan  ?Current plan is for placement. ? Ryan Zuniga is not under involuntary commitment. ? ? ?  ?[name], MD ?08/10/21 [phone] ? ?

## 2021-08-10 NOTE — ED Notes (Signed)
Pt speaking to his mother on the phone.  ?

## 2021-08-10 NOTE — ED Notes (Signed)

## 2021-08-10 NOTE — TOC Progression Note (Signed)
Transition of Care (TOC) - Progression Note  ? ? ?Patient Details  ?Name: Ryan Zuniga ?MRN: [id number] ?Date of Birth: 04-May-1980 ? ?Transition of Care (TOC) CM/SW Contact  ?Shelbie Hutching, RN ?Phone Number: ?08/10/2021, 11:23 AM ? ?Clinical Narrative:    ?Received an update from [place] [phone] from [facility].  She has a lead on placement at [facility], they have multiple placements available- their [place] location is [place].  I will [place] email her referral information and maybe they can set up a time to come and evaluate patient.  ? ? ?Expected Discharge Plan: Group Home ?Barriers to Discharge: Other (must enter comment) (Group home will not take back) ? ?Expected Discharge Plan and Services ?Expected Discharge Plan: Group Home ?  ?Discharge Planning Services: CM Consult ?  ?Living arrangements for the past 2 months: Group Home ?                ?DME Arranged: N/A ?DME Agency: NA ?  ?  ?  ?HH Arranged: NA ?[place] Agency: NA ?  ?  ?  ? ? ?Social Determinants of Health (SDOH) Interventions ?  ? ?Readmission Risk Interventions ?   ? View : No data to display.  ?  ?  ?  ? ? ?

## 2021-08-10 NOTE — ED Notes (Signed)
Pt received snack. Pt has no other needs right now.  ?

## 2021-08-11 MED ORDER — LORAZEPAM 1 MG PO TABS
1.0000 mg | ORAL_TABLET | Freq: Two times a day (BID) | ORAL | Status: DC | PRN
  Administered 2021-08-11 – 2022-01-18 (×41): 1 mg via ORAL
  Filled 2021-08-11 (×47): qty 1

## 2021-08-11 NOTE — ED Notes (Signed)
Pt given snack. 

## 2021-08-11 NOTE — ED Notes (Signed)
Patient provided with snack and beverage at this time.  ?

## 2021-08-11 NOTE — ED Notes (Signed)
Caseworker at bedside discussing housing ?

## 2021-08-11 NOTE — ED Notes (Addendum)
Report received from Katie, RN including SBAR. Patient alert and oriented, warm and dry, in no acute distress. Patient denies SI, HI, AVH and pain. Patient made aware of Q15 minute rounds and security cameras for their safety. Patient instructed to come to this nurse with needs or concerns.  

## 2021-08-11 NOTE — ED Provider Notes (Signed)
Emergency Medicine Observation Re-evaluation Note ? ?Ryan Zuniga is a 41 y.o. male, seen on rounds today.  Pt initially presented to the ED for complaints of Suicidal and Homicidal ?Currently, the patient is resting comfortably in his room and has no complaints. ? ?Physical Exam  ?BP (!) 133/99 (BP Location: Left Arm)   Pulse 78   Temp 98 ?F (36.7 ?C) (Oral)   Resp 18   Ht '5\' 8"'$  (1.727 m)   Wt 68 kg   SpO2 98%   BMI 22.81 kg/m?  ?Physical Exam ?General: Alert ?Cardiac: Well perfused ?Lungs: Normal effort ?Psych: Calm and cooperative ? ?ED Course / MDM  ?EKG:  ? ?I have reviewed the labs performed to date as well as medications administered while in observation.  There have been no changes to his status in the last 24 hours. ? ?Plan  ?Current plan is for placement. ? Leven Hoel is not under involuntary commitment. ? ? ?  ?Arta Silence, MD ?08/11/21 [phone] ? ?

## 2021-08-11 NOTE — ED Notes (Signed)
Pt verbalized to staff that he was feeling agitated, ED MD notified.  PRN ordered, cont to monitor pt as ordered ? ?

## 2021-08-11 NOTE — ED Notes (Signed)
Report received from Kimberly S , RN including SBAR. On initial round after report Pt is warm/dry, resting quietly in room without any s/s of distress.  Will continue to monitor throughout shift as ordered for any changes in behaviors and for continued safety.   

## 2021-08-11 NOTE — ED Notes (Signed)
Pt was witnessed taking styrofoam cups out of the trash can in the day room, taking them to his room and filling them slightly with water.  This pt behavior has been witnessed daily with the hoarding of cups and filling with water.  All cups were removed from pt room causing distress to pt.  Staff spoke with pt and agreed to allowing pt to keep 6 clean cup in pt room with the understanding that these clean cups are the only ones to remain in the pt room.  ?

## 2021-08-11 NOTE — ED Notes (Signed)
VOL/pending placement 

## 2021-08-11 NOTE — ED Notes (Signed)
VOL  PENDING  PLACEMENT 

## 2021-08-12 NOTE — ED Notes (Signed)
Snack and beverage given. 

## 2021-08-12 NOTE — ED Notes (Signed)
Hospital meal provided.  100% consumed, pt tolerated w/o complaints.  Waste discarded appropriately.   

## 2021-08-12 NOTE — ED Notes (Signed)
Patient provided snack at appropriate snack time.  Pt consumed 100% of snack provided, tolerated well w/o complaints   Trash disposted of appropriately by patient.  

## 2021-08-12 NOTE — ED Notes (Addendum)
1 

## 2021-08-12 NOTE — ED Notes (Signed)
Report to include Situation, Background, Assessment, and Recommendations received from Antacia RN. Patient alert and oriented, warm and dry, in no acute distress. Patient denies SI, HI, AVH and pain. Patient made aware of Q15 minute rounds and security cameras for their safety. Patient instructed to come to me with needs or concerns.  

## 2021-08-12 NOTE — ED Notes (Signed)
Hospital meal provided, pt tolerated w/o complaints.  Waste discarded appropriately.  

## 2021-08-12 NOTE — ED Notes (Signed)
Hospital meal provided.  100% consumed, pt tolerated w/o complaints.  Waste discarded appropriately.  ? ?New linens provided. Encouraged patient to tidy room, provided trash can for patient to throw away any trash in patient room with staff supervision. ? ? ?Refused shower. ?

## 2021-08-12 NOTE — ED Notes (Signed)
Pt given evening snack.  ?

## 2021-08-12 NOTE — ED Notes (Signed)
RN into patient room to give him his mediations and he starts yelling "no, no, no" then jumps over bed and out of room into dayroom. Will attempt later in day. ?

## 2021-08-12 NOTE — ED Notes (Addendum)
Hospital meal provided.  

## 2021-08-13 NOTE — ED Notes (Signed)
Pt bedside cleaned.  ?

## 2021-08-13 NOTE — ED Notes (Signed)
Sandwich tray, drink and icecream provided ?

## 2021-08-13 NOTE — ED Notes (Signed)
Pt given phone to speak with father.  ?

## 2021-08-13 NOTE — ED Notes (Signed)
Pt requesting snack, this tech asked to obtain VS first- pt agreeable. ?

## 2021-08-13 NOTE — ED Notes (Signed)
VOL  PENDING  PLACEMENT 

## 2021-08-13 NOTE — ED Notes (Signed)
Breakfast tray given to pt. Pt appreciative. ?

## 2021-08-13 NOTE — ED Notes (Signed)
VOL/Pending Placement 

## 2021-08-13 NOTE — ED Notes (Signed)
Pt given dinner tray at this time.  

## 2021-08-13 NOTE — TOC Progression Note (Signed)
Transition of Care (TOC) - Progression Note  ? ? ?Patient Details  ?Name: Ryan Zuniga ?MRN: [id number] ?Date of Birth: 06-17-1980 ? ?Transition of Care (TOC) CM/SW Contact  ?Shelbie Hutching, RN ?Phone Number: ?08/13/2021, 3:57 PM ? ?Clinical Narrative:    ?RNCM spoke with Justice at [facility] [phone].  Justice has just finished the over 36 page application for [facility] and it has been submitted.  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  ? ? ?Expected Discharge Plan: Group Home ?Barriers to Discharge: Other (must enter comment) (Group home will not take back) ? ?Expected Discharge Plan and Services ?Expected Discharge Plan: Group Home ?  ?Discharge Planning Services: CM Consult ?  ?Living arrangements for the past 2 months: Group Home ?                ?DME Arranged: N/A ?DME Agency: NA ?  ?  ?  ?HH Arranged: NA ?[place] Agency: NA ?  ?  ?  ? ? ?Social Determinants of Health (SDOH) Interventions ?  ? ?Readmission Risk Interventions ?   ? View : No data to display.  ?  ?  ?  ? ? ?

## 2021-08-13 NOTE — ED Notes (Signed)
EDP made aware of pt's refusal of vitals and meds. ?

## 2021-08-13 NOTE — ED Notes (Signed)
Pt offered medication that was refused this morning. Pt cooperative with taking medication, no other needs expressed at this time.  ?

## 2021-08-13 NOTE — ED Notes (Signed)
This Probation officer and security went into pt's room to collect vitals, pt refused stating "I'm good" and motioned left hand to "go away".  ?

## 2021-08-13 NOTE — ED Notes (Signed)
Pt refused vitals with EDT. Will retry before med admin. ?

## 2021-08-14 NOTE — ED Notes (Signed)
Vol /pending placement 

## 2021-08-14 NOTE — ED Notes (Signed)
Pt given dinner tray and drink at this time. 

## 2021-08-14 NOTE — ED Notes (Signed)
Snack and beverage given. 

## 2021-08-14 NOTE — ED Notes (Signed)
Pt denies pain or any needs currently. Pt asked for coffee; pt reminded hot drinks not allowed in BHU for safety reasons.  ?

## 2021-08-14 NOTE — ED Notes (Signed)
Report to include Situation, Background, Assessment, and Recommendations received from [place] RN. Patient alert and oriented, warm and dry, in no acute distress. Patient denies SI, HI, AVH and pain. Patient made aware of Q15 minute rounds and security cameras for their safety. Patient instructed to come to me with needs or concerns.  

## 2021-08-14 NOTE — ED Notes (Signed)
Pt back to his room.  ?

## 2021-08-14 NOTE — ED Notes (Signed)
Pt up to restroom.

## 2021-08-14 NOTE — ED Notes (Signed)
VOL/Pending Placement 

## 2021-08-14 NOTE — ED Notes (Signed)

## 2021-08-15 NOTE — ED Notes (Signed)
Pt was provided with snack and drink ?

## 2021-08-15 NOTE — ED Notes (Signed)
Patient ate 100% of meal provided.  ?

## 2021-08-15 NOTE — ED Notes (Signed)
Pt up to bathroom.

## 2021-08-15 NOTE — ED Notes (Signed)
Report to include Situation, Background, Assessment, and Recommendations received from Amy RN. Patient alert and oriented, warm and dry, in no acute distress. Patient denies SI, HI, AVH and pain. Patient made aware of Q15 minute rounds and security cameras for their safety. Patient instructed to come to me with needs or concerns. ? ?

## 2021-08-15 NOTE — ED Notes (Signed)
Pt. Received breakfast with a drink. ?

## 2021-08-16 NOTE — ED Notes (Signed)
Report to include Situation, Background, Assessment, and Recommendations received from Amy RN. Patient alert and oriented, warm and dry, in no acute distress. Patient denies SI, HI, AVH and pain. Patient made aware of Q15 minute rounds and security cameras for their safety. Patient instructed to come to me with needs or concerns. ? ?

## 2021-08-16 NOTE — ED Notes (Signed)
Snack and beverage given. 

## 2021-08-16 NOTE — ED Notes (Signed)
Pt declined dinner tray.

## 2021-08-16 NOTE — ED Provider Notes (Signed)
Emergency Medicine Observation Re-evaluation Note ? ?Ryan Zuniga is a 41 y.o. male, seen on rounds today.  Pt initially presented to the ED for complaints of Suicidal and Homicidal ? ? ?Physical Exam  ?BP (!) 136/94   Pulse 69   Temp 97.9 ?F (36.6 ?C) (Oral)   Resp 16   Ht '5\' 8"'$  (1.727 m)   Wt 68 kg   SpO2 98%   BMI 22.81 kg/m?  ?Physical Exam ?General: Nad ?Cardiac: well perfused ?Lungs: unlabored ? ? ?ED Course / MDM  ?EKG:  ? ?I have reviewed the labs performed to date as well as medications administered while in observation.  Recent changes in the last 24 hours include . ? ?Plan  ?Current plan is for psych/soc. ? Mariel Gaudin is not under involuntary commitment. ? ? ?  ?[name], MD ?08/16/21 0820 ? ?

## 2021-08-16 NOTE — ED Notes (Signed)
Pt given breakfast tray and drink 

## 2021-08-16 NOTE — ED Notes (Signed)
VOL/pending placement 

## 2021-08-17 NOTE — ED Notes (Signed)
Patient provided snack at appropriate snack time.   ?

## 2021-08-17 NOTE — ED Notes (Signed)
Snack and beverage given. 

## 2021-08-17 NOTE — ED Notes (Signed)
Pt is resting comfortable with no acute distress. ?

## 2021-08-17 NOTE — ED Notes (Signed)
Refused shower, offered new linens or clothing-declines.  ? ?Refused meds.  ? ?Encouraged patient to tidy room, provided trash can for patient to throw away any trash in patient room with staff supervision. ?

## 2021-08-17 NOTE — ED Notes (Signed)
Refused breakfast.

## 2021-08-17 NOTE — ED Notes (Signed)
Report to include Situation, Background, Assessment, and Recommendations received from Andrea RN. Patient alert and oriented, warm and dry, in no acute distress. Patient denies SI, HI, AVH and pain. Patient made aware of Q15 minute rounds and security cameras for their safety. Patient instructed to come to me with needs or concerns.  

## 2021-08-17 NOTE — ED Notes (Signed)
All trash removed from room.  ?

## 2021-08-17 NOTE — ED Notes (Signed)
VOL/Pending Placement 

## 2021-08-17 NOTE — ED Notes (Signed)
Pt refused lunch tray; took cookies and drink. ?

## 2021-08-17 NOTE — ED Provider Notes (Signed)
Emergency Medicine Observation Re-evaluation Note ? ?Ryan Zuniga is a 41 y.o. male, seen on rounds today.  ? ?Physical Exam  ?BP (!) 137/93 (BP Location: Left Arm)   Pulse 70   Temp 97.7 ?F (36.5 ?C)   Resp 18   Ht '5\' 8"'$  (1.727 m)   Wt 68 kg   SpO2 96%   BMI 22.81 kg/m?  ?Physical Exam ?General: Patient resting comfortably in bed ?Lungs: Patient in no respiratory distress ?Psych: Patient not combative ? ?ED Course / MDM  ?EKG:  ? ? ? ?Plan  ?Current plan is for social work evaluation and placement. ? Crayton Savarese is not under involuntary commitment. ? ? ?  ?Nena Polio, MD ?08/17/21 [phone] ? ?

## 2021-08-17 NOTE — TOC Progression Note (Signed)
Transition of Care (TOC) - Progression Note  ? ? ?Patient Details  ?Name: Hurley Sobel ?MRN: [id number] ?Date of Birth: 1981/03/10 ? ?Transition of Care (TOC) CM/SW Contact  ?Shelbie Hutching, RN ?Phone Number: ?08/17/2021, 2:30 PM ? ?Clinical Narrative:    ?RNCM was able to speak with Gail contact [facility], she did receive the referral I sent to them last week.  She reports that it is on their tracking board but she is not sure if they have had time to review.  They will reach out after reviewing if they feel patient would be a good fit.   ? ?Followed up with [name] with [facility] - cell [phone]- she prefers to be contacted by her office line [phone] ext [phone].  She will follow up with [facility] to see if they have had a chance to review the 180 pages of referral information that she sent last week.  Justice confirmed that they did get my fax with the IDD diagnosis and she sent that in as well to [facility].   ? ? ?Expected Discharge Plan: Group Home ?Barriers to Discharge: Other (must enter comment) (Group home will not take back) ? ?Expected Discharge Plan and Services ?Expected Discharge Plan: Group Home ?  ?Discharge Planning Services: CM Consult ?  ?Living arrangements for the past 2 months: Group Home ?                ?DME Arranged: N/A ?DME Agency: NA ?  ?  ?  ?HH Arranged: NA ?[place] Agency: NA ?  ?  ?  ? ? ?Social Determinants of Health (SDOH) Interventions ?  ? ?Readmission Risk Interventions ?   ? View : No data to display.  ?  ?  ?  ? ? ?

## 2021-08-17 NOTE — ED Notes (Signed)
Pt is resting comfortably with no acute distress. ?

## 2021-08-17 NOTE — ED Notes (Signed)
Pt given drink, fruit and grits from tray; refused the remainder of meal tray. ?

## 2021-08-18 NOTE — ED Notes (Signed)
VOL/Pending placement 

## 2021-08-18 NOTE — ED Notes (Signed)
Patient provided snack at appropriate snack time.  Pt consumed 100% of snack provided, tolerated well w/o complaints   Trash disposted of appropriately by patient.  

## 2021-08-18 NOTE — ED Notes (Signed)
Hospital meal provided, pt tolerated w/o complaints.  Waste discarded appropriately.  

## 2021-08-18 NOTE — ED Notes (Signed)

## 2021-08-18 NOTE — ED Notes (Signed)
Report received from [place], Conservation officer, nature. On initial round after report Pt is warm/dry, resting quietly in room without any s/s of distress.  Will continue to monitor throughout shift as ordered for any changes in behaviors and for continued safety.   ?

## 2021-08-18 NOTE — ED Notes (Signed)
VOL/pending placement 

## 2021-08-19 NOTE — ED Notes (Addendum)
Pt refusing vitals and verbally agitated, repeatedly stating "I'm good, thanks. Leave me alone." Nurse aware. Will attempt again at later time. ?

## 2021-08-19 NOTE — ED Notes (Signed)
Pt. Alert and oriented, warm and dry, in no distress. Pt. Denies SI, HI, and AVH. Pt refused his HS medications.Pt states, "I don't care about that fucking medicine." Pt. Encouraged to let nursing staff know of any concerns or needs. ? ?

## 2021-08-19 NOTE — ED Notes (Signed)
Pt given nighttime snack. 

## 2021-08-19 NOTE — ED Provider Notes (Signed)
Emergency Medicine Observation Re-evaluation Note ? ?Asheton Scheffler is a 41 y.o. male, seen on rounds today.  Pt initially presented to the ED for complaints of Suicidal and Homicidal ? ?Currently, the patient is asleep. No concern from [place] ? ?Physical Exam  ?Blood pressure 136/88, pulse 74, temperature 98.3 ?F (36.8 ?C), temperature source Oral, resp. rate 16, height '5\' 8"'$  (1.727 m), weight 68 kg, SpO2 98 %. ? ?Physical Exam ?General: No apparent distress ?HEENT: moist mucous membranes ? ?Pulm: Normal WOB ?Neuro: resting  ?   ? ?ED Course / MDM  ?  ? ?I have reviewed the labs performed to date as well as medications administered while in observation.  Recent changes in the last 24 hours include none ? ?Plan  ? ?Current plan is to continue to wait for placement  ?Patient is not under full IVC at this time. ?  ?Vanessa [place], MD ?08/19/21 [phone] ? ?

## 2021-08-19 NOTE — ED Notes (Signed)
Report received from [place] , Conservation officer, nature. On initial round after report Pt is warm/dry, resting quietly in room without any s/s of distress.  Will continue to monitor throughout shift as ordered for any changes in behaviors and for continued safety.   ?

## 2021-08-19 NOTE — ED Notes (Signed)
Patient apologized to Probation officer and took HS medications. Patient states, "I was just in a bad mood. I am sorry I will take my medicine now."  ?

## 2021-08-19 NOTE — ED Notes (Signed)
VOL/Pending Placement 

## 2021-08-19 NOTE — TOC Progression Note (Signed)
Transition of Care (TOC) - Progression Note  ? ? ?Patient Details  ?Name: Ryan Zuniga ?MRN: [id number] ?Date of Birth: 08/12/1980 ? ?Transition of Care (TOC) CM/SW Contact  ?Shelbie Hutching, RN ?Phone Number: ?08/19/2021, 6:16 PM ? ?Clinical Narrative:    ?No updates from guardians or [facility] on placement.  Will follow up with [facility] tomorrow to see if they have been able to review referral.   ? ? ?Expected Discharge Plan: Group Home ?Barriers to Discharge: Other (must enter comment) (Group home will not take back) ? ?Expected Discharge Plan and Services ?Expected Discharge Plan: Group Home ?  ?Discharge Planning Services: CM Consult ?  ?Living arrangements for the past 2 months: Group Home ?                ?DME Arranged: N/A ?DME Agency: NA ?  ?  ?  ?HH Arranged: NA ?[place] Agency: NA ?  ?  ?  ? ? ?Social Determinants of Health (SDOH) Interventions ?  ? ?Readmission Risk Interventions ?   ? View : No data to display.  ?  ?  ?  ? ? ?

## 2021-08-19 NOTE — ED Notes (Signed)
Hospital meal provided, pt tolerated w/o complaints.  Waste discarded appropriately.  

## 2021-08-19 NOTE — ED Notes (Signed)
Report received from Katie, RN including SBAR. Patient alert and oriented, warm and dry, in no acute distress. Patient denies SI, HI, AVH and pain. Patient made aware of Q15 minute rounds and security cameras for their safety. Patient instructed to come to this nurse with needs or concerns.  

## 2021-08-20 NOTE — ED Notes (Signed)
Patient refused meal tray 

## 2021-08-20 NOTE — ED Provider Notes (Signed)
Emergency Medicine Observation Re-evaluation Note ? ?Ryan Zuniga is a 41 y.o. male, seen on rounds today.  Pt initially presented to the ED for complaints of Suicidal and Homicidal ?Currently, the patient is resting comfortably. ? ?Physical Exam  ?BP 133/89 (BP Location: Left Arm)   Pulse 81   Temp 97.8 ?F (36.6 ?C) (Oral)   Resp 15   Ht '5\' 8"'$  (1.727 m)   Wt 68 kg   SpO2 100%   BMI 22.81 kg/m?  ?Physical Exam ?General: No acute distress ?Cardiac: Well-perfused extremities ?Lungs: No respiratory distress ?Psych: Appropriate mood and affect ? ?ED Course / MDM  ?EKG:  ? ?I have reviewed the labs performed to date as well as medications administered while in observation.  Recent changes in the last 24 hours include none. ? ?Plan  ?Current plan is for placement. ? Parmvir Boomer is not under involuntary commitment. ? ? ?  ?Naaman Plummer, MD ?08/20/21 1008 ? ?

## 2021-08-20 NOTE — ED Notes (Signed)
VOL/Pending Placement 

## 2021-08-20 NOTE — ED Notes (Signed)
Spoon returned to [employer].  ?

## 2021-08-20 NOTE — ED Notes (Signed)
Breakfast tray given. °

## 2021-08-20 NOTE — ED Notes (Signed)
dinner tray given. ?

## 2021-08-20 NOTE — ED Notes (Signed)
NT collected 20+ cups of water out of patient's room.  Pt was cooperative.  ?

## 2021-08-21 NOTE — ED Notes (Signed)
Pt given dinner tray.

## 2021-08-21 NOTE — ED Notes (Signed)
Pt would not allow NT to complete BP and HR check so will hold inderal until pt agrees to vital signs. ?

## 2021-08-21 NOTE — ED Notes (Signed)
VOL/pending placement 

## 2021-08-21 NOTE — ED Provider Notes (Signed)
Emergency Medicine Observation Re-evaluation Note ? ?Nehan Flaum is a 41 y.o. male, seen on rounds today.  Pt initially presented to the ED for complaints of Suicidal and Homicidal ?Currently, the patient is resting without any acute concerns. ? ?Physical Exam  ?BP 125/83 (BP Location: Left Arm)   Pulse 73   Temp 97.7 ?F (36.5 ?C) (Oral)   Resp 16   Ht '5\' 8"'$  (1.727 m)   Wt 68 kg   SpO2 98%   BMI 22.81 kg/m?  ?Physical Exam ?General:  no acute distress ?Psych: calm ? ?ED Course / MDM  ?EKG:  ? ?I have reviewed the labs performed to date as well as medications administered while in observation.  Recent changes in the last 24 hours include none. ? ?Plan  ?Current plan is for social work dispo. ? Eric Nees is not under involuntary commitment. ? ? ?  ?Lucrezia Starch, MD ?08/21/21 1236 ? ?

## 2021-08-21 NOTE — ED Notes (Signed)
Patient given evening snack. NAD at this time. ?

## 2021-08-21 NOTE — ED Notes (Signed)
Pt refused vitals at this time. Will try again later. ?

## 2021-08-21 NOTE — ED Notes (Signed)
Q15 rounding Pt was resting comfortable with no acute distress. ?

## 2021-08-21 NOTE — TOC Progression Note (Signed)
Transition of Care (TOC) - Progression Note  ? ? ?Patient Details  ?Name: Fynn Vanblarcom ?MRN: [id number] ?Date of Birth: Sep 12, 1980 ? ?Transition of Care (TOC) CM/SW Contact  ?Shelbie Hutching, RN ?Phone Number: ?08/21/2021, 4:52 PM ? ?Clinical Narrative:    ?RNCM called [facility] and left a message to see if they had a chance to review referral.  No update from guardian or [place].  RNCM will follow up with Guardian on Monday.  ? ? ?Expected Discharge Plan: Group Home ?Barriers to Discharge: Other (must enter comment) (Group home will not take back) ? ?Expected Discharge Plan and Services ?Expected Discharge Plan: Group Home ?  ?Discharge Planning Services: CM Consult ?  ?Living arrangements for the past 2 months: Group Home ?                ?DME Arranged: N/A ?DME Agency: NA ?  ?  ?  ?HH Arranged: NA ?[place] Agency: NA ?  ?  ?  ? ? ?Social Determinants of Health (SDOH) Interventions ?  ? ?Readmission Risk Interventions ?   ? View : No data to display.  ?  ?  ?  ? ? ?

## 2021-08-21 NOTE — ED Notes (Signed)
Pt given nighttime snack. 

## 2021-08-21 NOTE — ED Notes (Signed)
Meal tray thrown in garbage along with spoon. ?

## 2021-08-21 NOTE — ED Notes (Signed)
Pt given lunch tray.

## 2021-08-21 NOTE — ED Notes (Signed)
Patient provided with breakfast tray.

## 2021-08-22 NOTE — ED Provider Notes (Signed)
----------------------------------------- ?  2:31 PM on 08/22/2021 ?----------------------------------------- ? ? ?BP (!) 158/109 (BP Location: Right Arm)   Pulse 71   Temp 97.8 ?F (36.6 ?C) (Oral)   Resp 17   Ht 1.727 m ('5\' 8"'$ )   Wt 68 kg   SpO2 99%   BMI 22.81 kg/m?  ? ?No acute events overnight. Vitals reviewed. Patient remains medically cleared.  Disposition is pending per Psychiatry/Behavioral Medicine team recommendations. ? ?  ?Lavonia Drafts, MD ?08/22/21 1431 ? ?

## 2021-08-22 NOTE — ED Notes (Signed)
Report received from Keyri M, RN including SBAR. On initial round after report Pt is warm/dry, resting quietly in room without any s/s of distress.  Will continue to monitor throughout shift as ordered for any changes in behaviors and for continued safety.   

## 2021-08-22 NOTE — ED Notes (Signed)

## 2021-08-22 NOTE — ED Notes (Signed)
Patient refused vitals and snack. Patient would just say "No thank you." And aggressively closed swinging foam door.  ?

## 2021-08-23 NOTE — ED Notes (Signed)
Report received from Kimberly S , RN including SBAR. On initial round after report Pt is warm/dry, resting quietly in room without any s/s of distress.  Will continue to monitor throughout shift as ordered for any changes in behaviors and for continued safety.   

## 2021-08-23 NOTE — ED Notes (Signed)
Hospital meal provided, pt tolerated w/o complaints.  Waste discarded appropriately.  

## 2021-08-23 NOTE — ED Notes (Signed)
Pt father visiting with patient in day room ?

## 2021-08-23 NOTE — ED Provider Notes (Signed)
Emergency Medicine Observation Re-evaluation Note ? ?Ryan Zuniga is a 41 y.o. male, seen on rounds today.  Pt initially presented to the ED for complaints of Suicidal and Homicidal ? ? ?Physical Exam  ?BP 126/86 (BP Location: Left Arm)   Pulse 75   Temp 98.2 ?F (36.8 ?C) (Oral)   Resp 16   Ht '5\' 8"'$  (1.727 m)   Wt 68 kg   SpO2 99%   BMI 22.81 kg/m?  ?Physical Exam ?General: no acute distress ? ?Psych: calm ? ?ED Course / MDM  ?EKG:  ? ?I have reviewed the labs performed to date as well as medications administered while in observation.  Recent changes in the last 24 hours include none. ? ?Plan  ?Current plan is for social work dispo. ? Derry Kassel is not under involuntary commitment. ? ? ?  ?Lucrezia Starch, MD ?08/23/21 1551 ? ?

## 2021-08-23 NOTE — ED Notes (Signed)
Unlocked bathroom door to allow patient to shower.  Staff continuously monitored dayroom outside of bathroom door during pt shower.  Pt was given hygiene items as well as:  I clean top, 1 clean bottom, with 1 pair of disposable underwear.  Pt changed out into clean clothing.  Staff disposed of all shower supplies. Shower room cleaned and secured for next use.  

## 2021-08-23 NOTE — ED Notes (Signed)
Pt came to staff and stated "I'm so sorry I was mad this morning" Pt allowed staff to obtain vitals, as well as ask for set up for shower. Pt stated that "my dad might come see me today."  Pt had bright affect ?

## 2021-08-23 NOTE — ED Notes (Signed)
Pt refused vitals, stated "No thank you" from inside his room.  Staff allowed patient space to have time to use coping skills to calm down. ?

## 2021-08-24 NOTE — TOC Progression Note (Addendum)
Transition of Care (TOC) - Progression Note  ? ? ?Patient Details  ?Name: Ryan Zuniga ?MRN: [id number] ?Date of Birth: [birth year] ? ?Transition of Care (TOC) CM/SW Contact  ?Shelbie Hutching, RN ?Phone Number: ?08/24/2021, 2:20 PM ? ?Clinical Narrative:    ?Message left for [name] [phone] ext [phone]- guardian with [employer]-called her and left message for return call for update on placement. ?Called [facility] to see if they have had a chance to review.  [name] is out today but they will leave a message for her to return my call tomorrow with update.   ? ? ?Expected Discharge Plan: Group Home ?Barriers to Discharge: Other (must enter comment) (Group home will not take back) ? ?Expected Discharge Plan and Services ?Expected Discharge Plan: Group Home ?  ?Discharge Planning Services: CM Consult ?  ?Living arrangements for the past 2 months: Group Home ?                ?DME Arranged: N/A ?DME Agency: NA ?  ?  ?  ?HH Arranged: NA ?[place] Agency: NA ?  ?  ?  ? ? ?Social Determinants of Health (SDOH) Interventions ?  ? ?Readmission Risk Interventions ?   ? View : No data to display.  ?  ?  ?  ? ? ?

## 2021-08-24 NOTE — ED Provider Notes (Signed)
Emergency Medicine Observation Re-evaluation Note ? ?Ryan Zuniga is a 41 y.o. male, seen on rounds today.  ? ?Physical Exam  ?BP (!) 134/93 (BP Location: Left Arm)   Pulse 75   Temp 97.8 ?F (36.6 ?C) (Oral)   Resp 16   Ht '5\' 8"'$  (1.727 m)   Wt 68 kg   SpO2 100%   BMI 22.81 kg/m?  ?Physical Exam ?General: Patient resting comfortably in bed ?Lungs: Patient not in respiratory distress ?Psych: Patient not combative ? ?ED Course / MDM  ?EKG:  ? ? ? ?Plan  ?Current plan is for social work disposition. ? Ryan Zuniga is not under involuntary commitment. ? ? ?  ?Nena Polio, MD ?08/24/21 [phone] ? ?

## 2021-08-24 NOTE — ED Notes (Signed)
Report received from Amy, RN including SBAR. Patient alert and oriented, warm and dry, in no acute distress. Patient denies SI, HI, AVH and pain. Patient made aware of Q15 minute rounds and security cameras for their safety. Patient instructed to come to this nurse with needs or concerns.  

## 2021-08-24 NOTE — ED Notes (Signed)
Pt refused breakfast tray.  

## 2021-08-24 NOTE — ED Notes (Signed)
Pt is watching tv with no acute distress. ?

## 2021-08-24 NOTE — ED Notes (Signed)
15+ cups of water removed from patient's room.  Pt cooperative. ?

## 2021-08-24 NOTE — ED Notes (Signed)
Pt refused lunch tray.  Pt agitated and stated he needs to speak to a supervisor about staff taking his $5.   ?

## 2021-08-24 NOTE — ED Notes (Signed)
Pt speaking wit his father on the phone.  ?

## 2021-08-24 NOTE — ED Notes (Signed)
VOL/Pending Placement 

## 2021-08-24 NOTE — ED Notes (Signed)
VOl pending placement 

## 2021-08-25 MED ORDER — ARIPIPRAZOLE ER 400 MG IM SRER: 400.0000 mg | INTRAMUSCULAR | Status: DC

## 2021-08-25 NOTE — ED Notes (Signed)
Report received from Jahaan B, RN including SBAR. On initial round after report Pt is warm/dry, resting quietly in room without any s/s of distress.  Will continue to monitor throughout shift as ordered for any changes in behaviors and for continued safety.   

## 2021-08-25 NOTE — TOC Progression Note (Signed)
Transition of Care (TOC) - Progression Note  ? ? ?Patient Details  ?Name: Ryan Zuniga ?MRN: [id number] ?Date of Birth: Apr 25, 1980 ? ?Transition of Care (TOC) CM/SW Contact  ?Shelbie Hutching, RN ?Phone Number: ?08/25/2021, 3:57 PM ? ?Clinical Narrative:    ?Justice Games developer with [employer] guardianship returned call, she talked with Oley Balm over at [facility] has no updates other than patient is on wait list at [facility].  Justice is going to follow back up with [facility].   ? ? ?Expected Discharge Plan: Group Home ?Barriers to Discharge: Other (must enter comment) (Group home will not take back) ? ?Expected Discharge Plan and Services ?Expected Discharge Plan: Group Home ?  ?Discharge Planning Services: CM Consult ?  ?Living arrangements for the past 2 months: Group Home ?                ?DME Arranged: N/A ?DME Agency: NA ?  ?  ?  ?HH Arranged: NA ?[place] Agency: NA ?  ?  ?  ? ? ?Social Determinants of Health (SDOH) Interventions ?  ? ?Readmission Risk Interventions ?   ? View : No data to display.  ?  ?  ?  ? ? ?

## 2021-08-25 NOTE — ED Notes (Signed)
Hospital meal provided, pt tolerated w/o complaints.  Waste discarded appropriately.  

## 2021-08-25 NOTE — ED Notes (Signed)
VOl/Pending Placement 

## 2021-08-25 NOTE — ED Notes (Signed)
Pt given snack. 

## 2021-08-25 NOTE — ED Notes (Signed)
When pt's father left pt was insisting that father leave money for him.  Staff informed Kelen and family that there is no need for money while he is here at the hospital.  Pt's father gave staff 5 - one dollar bills that was witnessed by security and placed in a stapled envelope and put into pt belongings bin in pt property storage ?

## 2021-08-25 NOTE — ED Notes (Signed)
VOL  PENDING  PLACEMENT 

## 2021-08-25 NOTE — ED Provider Notes (Signed)
Emergency Medicine Observation Re-evaluation Note ? ?Ryan Zuniga is a 41 y.o. male, seen on rounds today.  Pt initially presented to the ED for complaints of Suicidal and Homicidal ? ? ?Physical Exam  ?BP 127/85 (BP Location: Left Arm)   Pulse 74   Temp 98.1 ?F (36.7 ?C) (Oral)   Resp 17   Ht '5\' 8"'$  (1.727 m)   Wt 68 kg   SpO2 99%   BMI 22.81 kg/m?  ?Physical Exam ?General: NAD ?Cardiac: wel perfused ?Lungs: unlabored ?Psych: sleeping ? ?ED Course / MDM  ?EKG:  ? ?I have reviewed the labs performed to date as well as medications administered while in observation.  Recent changes in the last 24 hours include . ? ?Plan  ?Current plan is for psych.soc. ? Ryan Zuniga is not under involuntary commitment. ? ? ?  ?[name], MD ?08/25/21 [phone] ? ?

## 2021-08-26 NOTE — ED Notes (Signed)
Pt refused breakfast, did take the milk. ?

## 2021-08-26 NOTE — ED Notes (Signed)
VOL  PENDING  PLACEMENT 

## 2021-08-26 NOTE — ED Notes (Signed)
Snacks and a drink given.  

## 2021-08-26 NOTE — ED Notes (Signed)
Hospital meal provided, pt tolerated w/o complaints.  Waste discarded appropriately.  

## 2021-08-26 NOTE — ED Provider Notes (Signed)
Emergency Medicine Observation Re-evaluation Note ? ?Ryan Zuniga is a 41 y.o. male, seen on rounds today.  Pt initially presented to the ED for complaints of Suicidal and Homicidal ? ? ?Physical Exam  ?BP (!) 141/95 (BP Location: Left Arm)   Pulse 77   Temp 98 ?F (36.7 ?C) (Oral)   Resp 18   Ht '5\' 8"'$  (1.727 m)   Wt 68 kg   SpO2 99%   BMI 22.81 kg/m?  ?Physical Exam ?General: Ambulatory in room without difficulty ?Cardiac: wel perfused ?Lungs: unlabored ?Psych: Stands up, gets up, says hello ? ?ED Course / MDM  ?EKG:  ? ?I have reviewed the labs performed to date as well as medications administered while in observation.  Recent changes in the last 24 hours include . ? ?Plan  ?Current plan is for psych.soc. ? Jenna Ardoin is not under involuntary commitment. ? ?  ?Delman Kitten, MD ?08/26/21 [phone] ? ?

## 2021-08-26 NOTE — ED Notes (Signed)
Report to include Situation, Background, Assessment, and Recommendations received from [name]. Patient alert and oriented, warm and dry, in no acute distress. Patient denies SI, HI, AVH and pain. Patient made aware of Q15 minute rounds and security cameras for their safety. Patient instructed to come to me with needs or concerns.  

## 2021-08-26 NOTE — ED Notes (Signed)
Report received from Manus Rudd, Conservation officer, nature. On initial round after report Pt is warm/dry, resting quietly in room without any s/s of distress.  Will continue to monitor throughout shift as ordered for any changes in behaviors and for continued safety.   ?

## 2021-08-26 NOTE — ED Notes (Signed)
Pt received snack and drink ?

## 2021-08-27 NOTE — ED Provider Notes (Signed)
Emergency Medicine Observation Re-evaluation Note ? ?Ryan Zuniga is a 41 y.o. male, seen on rounds today.  Pt initially presented to the ED for complaints of Suicidal and Homicidal ? ?Currently, the patient is sleeping in bed.  Discussed the case with the [place] and they deny any concerns overnight ? ?Physical Exam  ?Blood pressure (!) 145/91, pulse 70, temperature 98 ?F (36.7 ?C), temperature source Oral, resp. rate 16, height '5\' 8"'$  (1.727 m), weight 68 kg, SpO2 100 %. ? ?Physical Exam ?General: No apparent distress ?Pulm: Normal WOB ?Neuro: resting  ?   ? ?ED Course / MDM  ?  ? ?I have reviewed the labs performed to date as well as medications administered while in observation.  Recent changes in the last 24 hours include none ? ?Plan  ? ?Current plan is to continue to wait for psych plan/placement if felt warranted  ?Patient is not under full IVC at this time. ?  ?Vanessa [place], MD ?08/27/21 [phone] ? ?

## 2021-08-27 NOTE — ED Notes (Addendum)
Lunch tray provided with beverage; pt refused food ?

## 2021-08-27 NOTE — ED Notes (Signed)
Dinner tray given. No other needs found at this moment.  ?

## 2021-08-27 NOTE — ED Notes (Signed)
Snack Given ?

## 2021-08-27 NOTE — ED Notes (Signed)
VOL pending placement see note on chart ?

## 2021-08-27 NOTE — ED Notes (Addendum)
EVS came to [facility] patient refused to have Ms. Sharon mop the floor in room. ?

## 2021-08-27 NOTE — TOC Progression Note (Signed)
Transition of Care (TOC) - Progression Note  ? ? ?Patient Details  ?Name: Granite Godman ?MRN: [id number] ?Date of Birth: 1980-05-15 ? ?Transition of Care (TOC) CM/SW Contact  ?Shelbie Hutching, RN ?Phone Number: ?08/27/2021, 4:35 PM ? ?Clinical Narrative:    ?Received call back from [place], she did get an email back from a facility with [facility] interested in patient.  She will send them all his clinicals for review.   ? ? ?Expected Discharge Plan: Group Home ?Barriers to Discharge: Other (must enter comment) (Group home will not take back) ? ?Expected Discharge Plan and Services ?Expected Discharge Plan: Group Home ?  ?Discharge Planning Services: CM Consult ?  ?Living arrangements for the past 2 months: Group Home ?                ?DME Arranged: N/A ?DME Agency: NA ?  ?  ?  ?HH Arranged: NA ?[place] Agency: NA ?  ?  ?  ? ? ?Social Determinants of Health (SDOH) Interventions ?  ? ?Readmission Risk Interventions ?   ? View : No data to display.  ?  ?  ?  ? ? ?

## 2021-08-27 NOTE — TOC Progression Note (Signed)
Transition of Care (TOC) - Progression Note  ? ? ?Patient Details  ?Name: Jerrad Mendibles ?MRN: [id number] ?Date of Birth: 10-09-1980 ? ?Transition of Care (TOC) CM/SW Contact  ?Shelbie Hutching, RN ?Phone Number: ?08/27/2021, 3:30 PM ? ?Clinical Narrative:    ?Reached out to [place] guardian [phone], voice mail left for return call to see if she has heard anything from [place].  Attempted to call Oley Balm [phone] from [facility] but call unable to be connected, will reattempt later. ? ? ?Expected Discharge Plan: Group Home ?Barriers to Discharge: Other (must enter comment) (Group home will not take back) ? ?Expected Discharge Plan and Services ?Expected Discharge Plan: Group Home ?  ?Discharge Planning Services: CM Consult ?  ?Living arrangements for the past 2 months: Group Home ?                ?DME Arranged: N/A ?DME Agency: NA ?  ?  ?  ?HH Arranged: NA ?[place] Agency: NA ?  ?  ?  ? ? ?Social Determinants of Health (SDOH) Interventions ?  ? ?Readmission Risk Interventions ?   ? View : No data to display.  ?  ?  ?  ? ? ?

## 2021-08-27 NOTE — ED Notes (Signed)
Breakfast tray provided with beverage  

## 2021-08-27 NOTE — ED Notes (Signed)
VOL/Pending Placement 

## 2021-08-28 NOTE — ED Notes (Signed)
Report to include Situation, Background, Assessment, and Recommendations received from [facility]. Patient alert and oriented, warm and dry, in no acute distress. Patient denies SI, HI, AVH and pain. Patient made aware of Q15 minute rounds and security cameras for their safety. Patient instructed to come to me with needs or concerns. ? ?

## 2021-08-28 NOTE — ED Notes (Signed)
VOL/pending placement 

## 2021-08-28 NOTE — ED Notes (Signed)
Offered breakfast, refused. Drank juice and milk both. ?

## 2021-08-28 NOTE — ED Notes (Signed)
Snack and beverage given. 

## 2021-08-28 NOTE — ED Notes (Signed)
Pt up to bathroom.

## 2021-08-28 NOTE — ED Notes (Signed)
Pt given snack. 

## 2021-08-28 NOTE — ED Notes (Signed)
Offered snack, declined. Given drink. ?

## 2021-08-28 NOTE — ED Provider Notes (Signed)
Emergency Medicine Observation Re-evaluation Note ? ?Ryan Zuniga is a 41 y.o. male, seen on rounds today.  Pt initially presented to the ED for complaints of Suicidal and Homicidal ?Currently, the patient is resting in his room without any acute complaints ? ?Physical Exam  ?BP (!) 146/83 (BP Location: Right Arm)   Pulse 82   Temp 97.9 ?F (36.6 ?C) (Oral)   Resp 18   Ht '5\' 8"'$  (1.727 m)   Wt 68 kg   SpO2 99%   BMI 22.81 kg/m?  ?Physical Exam ?General: no acute distress ?Psych: calm ? ?ED Course / MDM  ?EKG:  ? ?I have reviewed the labs performed to date as well as medications administered while in observation.  Recent changes in the last 24 hours include none. ? ?Plan  ?Current plan is for social work dispo. ? Phillips Goulette is not under involuntary commitment. ? ? ?  ?Lucrezia Starch, MD ?08/28/21 1013 ? ?

## 2021-08-28 NOTE — ED Notes (Signed)
Pt provided lunch.

## 2021-08-28 NOTE — ED Notes (Signed)
Meal tray given 

## 2021-08-29 NOTE — ED Notes (Addendum)
Pt offered the phone to call his father but he refused it stating "no, i'm good" repeatedly.  ?

## 2021-08-29 NOTE — ED Notes (Signed)
VOL/pending placement 

## 2021-08-29 NOTE — ED Notes (Signed)
Report to include Situation, Background, Assessment, and Recommendations received from Dorothy RN. Patient alert and oriented, warm and dry, in no acute distress. Patient denies SI, HI, AVH and pain. Patient made aware of Q15 minute rounds and security cameras for their safety. Patient instructed to come to me with needs or concerns.  

## 2021-08-29 NOTE — ED Notes (Signed)
Pt was offered breakfast and denied. No other needs at this time.  ?

## 2021-08-29 NOTE — ED Notes (Signed)
Pt received snack and drink ?

## 2021-08-29 NOTE — ED Notes (Signed)
Pt given lunch tray and drink at this time. 

## 2021-08-30 NOTE — ED Notes (Signed)
Pt refused vitals after being reminded to put the cups in his room in the trash can. Will attempt again later. ?

## 2021-08-30 NOTE — ED Notes (Signed)
Pt allowed this writer to remove 10 cups from his room.  ?

## 2021-08-30 NOTE — ED Notes (Signed)
Pt brought 4 cups up to nurse's station to throw away.  ?

## 2021-08-30 NOTE — ED Notes (Signed)
Pt given the phone to call his mother. ?

## 2021-08-30 NOTE — ED Provider Notes (Signed)
Emergency Medicine Observation Re-evaluation Note ? ?Ryan Zuniga is a 41 y.o. male, seen on rounds today.  Pt initially presented to the ED for complaints of Suicidal and Homicidal ?Currently, the patient is resting without any concerns ? ?Physical Exam  ?BP 129/88   Pulse 75   Temp (!) 97.5 ?F (36.4 ?C) (Oral)   Resp 18   Ht '5\' 8"'$  (1.727 m)   Wt 68 kg   SpO2 99%   BMI 22.81 kg/m?  ?Physical Exam ?General: no acute distress ?Psych: calm ? ?ED Course / MDM  ?EKG:  ? ?I have reviewed the labs performed to date as well as medications administered while in observation.  Recent changes in the last 24 hours include none. ? ?Plan  ?Current plan is for social work dispo. ? Tryce Surratt is not under involuntary commitment. ? ? ?  ?Lucrezia Starch, MD ?08/30/21 [phone] ? ?

## 2021-08-30 NOTE — ED Notes (Signed)
Report to include Situation, Background, Assessment, and Recommendations received from Amy RN. Patient alert and oriented, warm and dry, in no acute distress. Patient denies SI, HI, AVH and pain. Patient made aware of Q15 minute rounds and security cameras for their safety. Patient instructed to come to me with needs or concerns. ? ?

## 2021-08-30 NOTE — ED Notes (Signed)
Pt brought 3 cups to nurse's station to throw away. ?

## 2021-08-30 NOTE — ED Notes (Signed)
Pt has been irritable with staff today.  Pt becomes anxious and agitated when asked to throw out some cups in his room.  ?

## 2021-08-30 NOTE — ED Notes (Signed)
Snack and beverage given. 

## 2021-08-30 NOTE — ED Notes (Signed)
VOL/Pending Placement 

## 2021-08-30 NOTE — ED Notes (Signed)
Pt agreeable to VS and medications.   ?

## 2021-08-30 NOTE — ED Notes (Signed)
Pt given dinner tray and beverage  

## 2021-08-31 NOTE — ED Notes (Signed)
Pt received dinner tray.

## 2021-08-31 NOTE — ED Notes (Signed)
Patient voices preference for original types of trays over current meal trays (regular meal trays vs finger foods). Finger foods reordered per patient preference.  ?

## 2021-08-31 NOTE — ED Notes (Signed)
Pt given snack. 

## 2021-08-31 NOTE — ED Notes (Signed)
Pt used the phone ?

## 2021-08-31 NOTE — ED Notes (Signed)
Vol /pending placement 

## 2021-08-31 NOTE — ED Provider Notes (Signed)
Emergency Medicine Observation Re-evaluation Note ? ?Ryan Zuniga is a 41 y.o. male, seen on rounds today.  ? ?Physical Exam  ?BP 135/88 (BP Location: Right Arm)   Pulse 79   Temp 98.6 ?F (37 ?C) (Oral)   Resp 18   Ht '5\' 8"'$  (1.727 m)   Wt 68 kg   SpO2 100%   BMI 22.81 kg/m?  ?Physical Exam ?General: Patient resting comfortably in bed ?Lungs: Patient in no respiratory distress ?Psych: Patient is not combative ? ?ED Course / MDM  ?EKG:  ? ? ? ?Plan  ?Current plan is for social work placement. ? Inez Rosato is not under involuntary commitment. ? ? ?  ?Nena Polio, MD ?08/31/21 [phone] ? ?

## 2021-08-31 NOTE — ED Notes (Signed)
VOL/pending placement 

## 2021-08-31 NOTE — ED Notes (Signed)
Patient refused housekeeping to clean room at this time. ?

## 2021-09-01 NOTE — ED Notes (Signed)
Pt given dinner tray and sprite.  ?

## 2021-09-01 NOTE — TOC Progression Note (Signed)
Transition of Care (TOC) - Progression Note  ? ? ?Patient Details  ?Name: Ryan Zuniga ?MRN: [id number] ?Date of Birth: Jul 04, 1980 ? ?Transition of Care (TOC) CM/SW Contact  ?Shelbie Hutching, RN ?Phone Number: ?09/01/2021, 9:29 AM ? ?Clinical Narrative:    ?Received call back from [place], patient is on the wait list for [facility].   ? ? ?Expected Discharge Plan: Group Home ?Barriers to Discharge: Other (must enter comment) (Group home will not take back) ? ?Expected Discharge Plan and Services ?Expected Discharge Plan: Group Home ?  ?Discharge Planning Services: CM Consult ?  ?Living arrangements for the past 2 months: Group Home ?                ?DME Arranged: N/A ?DME Agency: NA ?  ?  ?  ?HH Arranged: NA ?[place] Agency: NA ?  ?  ?  ? ? ?Social Determinants of Health (SDOH) Interventions ?  ? ?Readmission Risk Interventions ?   ? View : No data to display.  ?  ?  ?  ? ? ?

## 2021-09-01 NOTE — TOC Progression Note (Signed)
Transition of Care (TOC) - Progression Note  ? ? ?Patient Details  ?Name: Marqueze Ramcharan ?MRN: [id number] ?Date of Birth: 05-18-1980 ? ?Transition of Care (TOC) CM/SW Contact  ?Shelbie Hutching, RN ?Phone Number: ?09/01/2021, 8:54 AM ? ?Clinical Narrative:    ?Called legal guardian, [name] with [employer] guardianship- [phone] ext [phone]- left her a message for update to see if she had heard back from [place]. ? ? ?Expected Discharge Plan: Group Home ?Barriers to Discharge: Other (must enter comment) (Group home will not take back) ? ?Expected Discharge Plan and Services ?Expected Discharge Plan: Group Home ?  ?Discharge Planning Services: CM Consult ?  ?Living arrangements for the past 2 months: Group Home ?                ?DME Arranged: N/A ?DME Agency: NA ?  ?  ?  ?HH Arranged: NA ?[place] Agency: NA ?  ?  ?  ? ? ?Social Determinants of Health (SDOH) Interventions ?  ? ?Readmission Risk Interventions ?   ? View : No data to display.  ?  ?  ?  ? ? ?

## 2021-09-01 NOTE — ED Notes (Signed)
Pt resting comfortably with no acute distress. ?

## 2021-09-01 NOTE — ED Notes (Signed)
Report received from Katie, RN including SBAR. Patient alert and oriented, warm and dry, in no acute distress. Patient denies SI, HI, AVH and pain. Patient made aware of Q15 minute rounds and security cameras for their safety. Patient instructed to come to this nurse with needs or concerns.  

## 2021-09-01 NOTE — ED Notes (Signed)
VOL  PENDING  PLACEMENT 

## 2021-09-01 NOTE — ED Notes (Signed)
Report received from Kimberly S , RN including SBAR. On initial round after report Pt is warm/dry, resting quietly in room without any s/s of distress.  Will continue to monitor throughout shift as ordered for any changes in behaviors and for continued safety.   

## 2021-09-01 NOTE — ED Notes (Signed)
Snack and drink given. Pt refused snack. ?

## 2021-09-01 NOTE — ED Provider Notes (Signed)
Emergency Medicine Observation Re-evaluation Note ? ?Ryan Zuniga is a 41 y.o. male, seen on rounds today. ? ?Physical Exam  ?BP 129/88 (BP Location: Right Arm)   Pulse 67   Temp 98.1 ?F (36.7 ?C) (Oral)   Resp 17   Ht '5\' 8"'$  (1.727 m)   Wt 68 kg   SpO2 98%   BMI 22.81 kg/m?  ?Physical Exam ?General: Patient resting comfortably in bed ?Lungs: Patient not in respiratory distress ?Psych: Patient not combative ? ?ED Course / MDM  ?EKG:  ? ? ? ?Plan  ?Current plan is for social work placement. ? Ryan Zuniga is not under involuntary commitment. ? ? ?  ?Nena Polio, MD ?09/01/21 [phone] ? ?

## 2021-09-01 NOTE — ED Notes (Signed)
Hospital meal provided, pt tolerated w/o complaints.  Waste discarded appropriately.  

## 2021-09-01 NOTE — ED Notes (Signed)
Before nurse could enter room pt requested staff not to, states he does not want any of his medications ?

## 2021-09-01 NOTE — ED Notes (Signed)
Hospital lunch meal provided, pt declined stated "just throw it away"  pt became agitated then requested to use phone.  Staff provided phone.  Pt calmed down once phone call was completed.  Went back to room continues to decline lunch. ?

## 2021-09-02 NOTE — ED Notes (Signed)
Pt given dinner tray.

## 2021-09-02 NOTE — ED Notes (Signed)
Patient refused to get room cleaned by housekeeping. ?

## 2021-09-02 NOTE — ED Notes (Signed)
EVS on unit, pt refused to allow EVS to sweep/mop floor.  Pt then stated "Fuck all you people, get out of here"  Pt went into room and isolated self from peers and staff.  Pt then requested to use phone to call guardian.  Staff allowed pt to call guardian.  Pt requesting to leave and be discharged to a homeless shelter.  Pt stated that he has "got to get out of here, I (he) feel like I'm going crazy in here, I need to go outside."  Staff attempted to verbally de-escalate patient.  Ryan Zuniga stated "I'm good OK, and went into room"  Staff allowed pt time to self sooth and use coping skills.  Cont to monitor as ordered ?

## 2021-09-02 NOTE — ED Notes (Signed)
Snack and drink given 

## 2021-09-02 NOTE — ED Provider Notes (Signed)
Emergency Medicine Observation Re-evaluation Note ? ?Ryan Zuniga is a 41 y.o. male, seen on rounds today.  Pt initially presented to the ED for complaints of Suicidal and Homicidal ? ? ?Physical Exam  ?BP (!) 128/91 (BP Location: Left Arm)   Pulse 71   Temp 98.3 ?F (36.8 ?C)   Resp 17   Ht '5\' 8"'$  (1.727 m)   Wt 68 kg   SpO2 98%   BMI 22.81 kg/m?  ?Physical Exam ?General: NAD ?Cardiac: well perfused ?Lungs: unlabored ?Psych: currently calm ? ?ED Course / MDM  ?EKG:  ? ?I have reviewed the labs performed to date as well as medications administered while in observation.  Recent changes in the last 24 hours include none. ? ?Plan  ?Current plan is for psych/soc. ? Ryan Zuniga is not under involuntary commitment. ? ? ?  ?[name], MD ?09/02/21 [phone] ? ?

## 2021-09-02 NOTE — ED Notes (Addendum)
Pt refused snack 

## 2021-09-02 NOTE — ED Notes (Signed)
Pt pacing in room, requested PRN.  PRN administered with daily medications.  Cont to monitor ?

## 2021-09-02 NOTE — ED Notes (Signed)
Hospital meal provided, pt tolerated w/o complaints.  Waste discarded appropriately.  

## 2021-09-02 NOTE — ED Notes (Signed)
Report received from Samarth B, RN including SBAR. On initial round after report Pt is warm/dry, resting quietly in room without any s/s of distress.  Will continue to monitor throughout shift as ordered for any changes in behaviors and for continued safety.   

## 2021-09-02 NOTE — ED Notes (Signed)
VOl on wait list at [place] group home ?

## 2021-09-02 NOTE — ED Notes (Signed)
Pt given snack. 

## 2021-09-03 NOTE — ED Notes (Signed)
Pt resting comfortably with no acute distress.

## 2021-09-03 NOTE — ED Notes (Signed)
Pt gave EDT 4 cups.

## 2021-09-03 NOTE — ED Notes (Signed)
Pt requested a drink.  RN asked pt to bring 2 cups from his room to throw away.  Pt became agitated and returned to his room.

## 2021-09-03 NOTE — ED Notes (Signed)
VOL/Pending Group Home Placement

## 2021-09-03 NOTE — ED Notes (Signed)
Snack and beverage given. 

## 2021-09-03 NOTE — ED Notes (Signed)
Report to include Situation, Background, Assessment, and Recommendations received from Amy RN. Patient alert and oriented, warm and dry, in no acute distress. Patient denies SI, HI, AVH and pain. Patient made aware of Q15 minute rounds and security cameras for their safety. Patient instructed to come to me with needs or concerns.

## 2021-09-03 NOTE — ED Notes (Signed)
Pt up to bathroom.  Door closed and locked behind patient.

## 2021-09-03 NOTE — ED Notes (Addendum)
Meal tray refused

## 2021-09-03 NOTE — ED Notes (Signed)
VOL  PENDING  PLACEMENT 

## 2021-09-03 NOTE — ED Notes (Addendum)
Pt came to nurse's station and grabbed cups out of paper trash bag.  Pt did not listen to staff and took cups to his room.    Trash bag removed from dayroom.

## 2021-09-03 NOTE — ED Notes (Signed)
Pt resting comfortably with no acute distress

## 2021-09-03 NOTE — ED Notes (Signed)
Patient refused dinner.

## 2021-09-03 NOTE — ED Notes (Signed)
Hospital meal provided, pt tolerated w/o complaints.  Waste discarded appropriately.  

## 2021-09-03 NOTE — ED Notes (Signed)
EDT Felicia on unit to speak with patient and help collect cups.  8 cups removed from room.

## 2021-09-03 NOTE — ED Notes (Signed)
Graham crackers, peanut butter and sprite given.

## 2021-09-03 NOTE — ED Notes (Signed)
Pt gave EDT 1 cup.

## 2021-09-04 MED ORDER — OLANZAPINE 5 MG PO TBDP
10.0000 mg | ORAL_TABLET | Freq: Two times a day (BID) | ORAL | Status: DC | PRN
  Administered 2021-10-07 – 2022-01-28 (×6): 10 mg via ORAL
  Filled 2021-09-04 (×8): qty 2

## 2021-09-04 MED ORDER — OLANZAPINE 10 MG PO TABS
10.0000 mg | ORAL_TABLET | Freq: Every day | ORAL | Status: DC
  Administered 2021-09-05 – 2022-01-31 (×140): 10 mg via ORAL
  Filled 2021-09-04 (×156): qty 1

## 2021-09-04 NOTE — ED Notes (Signed)
Hospital meal provided, pt tolerated w/o complaints.  Waste discarded appropriately.  

## 2021-09-04 NOTE — ED Notes (Signed)
VOL/Pending Placement 

## 2021-09-04 NOTE — ED Provider Notes (Signed)
Emergency Medicine Observation Re-evaluation Note  Ryan Zuniga is a 41 y.o. male, seen on rounds today.  Pt initially presented to the ED for complaints of Suicidal and Homicidal Currently, the patient is sitting comfortably in his room and has no acute complaints.  Physical Exam  BP (!) 134/95 (BP Location: Left Arm)   Pulse 70   Temp 97.6 F (36.4 C) (Oral)   Resp 18   Ht '5\' 8"'$  (1.727 m)   Wt 68 kg   SpO2 98%   BMI 22.81 kg/m  Physical Exam General: No distress Cardiac: Well perfused Lungs: Normal effort Psych: Calm and cooperative  ED Course / MDM  EKG:   I have reviewed the labs performed to date as well as medications administered while in observation.  There have been no significant changes in the last 24 hours.  Plan  Current plan is for social work placement.  Ryan Zuniga is not under involuntary commitment.     Arta Silence, MD 09/04/21 [phone]

## 2021-09-04 NOTE — ED Notes (Signed)
Report to include Situation, Background, Assessment, and Recommendations received from [name]. Patient alert and oriented, warm and dry, in no acute distress. Patient denies SI, HI, AVH and pain. Patient made aware of Q15 minute rounds and security cameras for their safety. Patient instructed to come to me with needs or concerns.  

## 2021-09-04 NOTE — ED Notes (Signed)
Pt received snack and drink

## 2021-09-04 NOTE — ED Notes (Signed)
Report received from [place], RN , Conservation officer, nature. On initial round after report Pt is warm/dry, resting quietly in room without any s/s of distress.  Will continue to monitor throughout shift as ordered for any changes in behaviors and for continued safety.

## 2021-09-04 NOTE — ED Notes (Signed)
Pt willingly gave staff 25 cups from room in exchange for 6 clean styrofoam cups.  Pt verbalized understanding that he has agreed with staff to only keep 6 cups in his room at a time.  Garbage disposed of

## 2021-09-05 NOTE — ED Notes (Signed)
Pt given breakfast tray at this time. 

## 2021-09-05 NOTE — ED Notes (Signed)
Report to include Situation, Background, Assessment, and Recommendations received from Dorothy RN. Patient alert and oriented, warm and dry, in no acute distress. Patient denies SI, HI, AVH and pain. Patient made aware of Q15 minute rounds and security cameras for their safety. Patient instructed to come to me with needs or concerns.  

## 2021-09-05 NOTE — ED Notes (Addendum)
Pt's father came to visit and dropped off clean clothes to include 1 navy hoodie, 1 blue tshirt, 1 pair blue jeans, 1 pair white socks, 1 pair white underwear, 1 black belt and 1 pair black sneakers.  Per pt request his father took home his other personal belongings to be washed to include 1 red jacket, 1 pair brown boots, 1 pair black jeans, 1 checked shirt, 1 pair black gloves, a pair of white socks and 1 pair of underwear. Father left pt $7 that was included in his valuables and personal belongings.

## 2021-09-05 NOTE — ED Notes (Signed)
Pt given nighttime.

## 2021-09-05 NOTE — ED Provider Notes (Signed)
Emergency Medicine Observation Re-evaluation Note  Ryan Zuniga is a 41 y.o. male, seen on rounds today.  Pt initially presented to the ED for complaints of Suicidal and Homicidal  Ambulates to door without difficulty.  Voices no concerns.  Physical Exam  BP 131/84   Pulse 82   Temp 98.4 F (36.9 C) (Oral)   Resp 16   Ht '5\' 8"'$  (1.727 m)   Wt 68 kg   SpO2 96%   BMI 22.81 kg/m  Physical Exam General: No distress Cardiac: Well perfused Lungs: Normal effort Psych: Calm and cooperative  Walks without difficulty  ED Course / MDM  EKG:   I have reviewed the labs performed to date as well as medications administered while in observation.  There have been no significant changes in the last 24 hours.  Plan  Current plan is for social work placement.  Aubrey Voong is not under involuntary commitment.    Delman Kitten, MD 09/05/21 [phone]

## 2021-09-05 NOTE — ED Notes (Signed)
VOL/pending placement 

## 2021-09-05 NOTE — ED Notes (Signed)
Pt refusing VS at this time.

## 2021-09-05 NOTE — ED Notes (Signed)
Pt watching tv with no acute distress

## 2021-09-06 NOTE — ED Notes (Signed)
Pt refused lunch tray stated "I'm good ok"  Staff disposed of lunch tray

## 2021-09-06 NOTE — ED Notes (Signed)
Pt received breakfast tray and ate meal approximately 100%  Pt threw trash in the bag provided by staff.

## 2021-09-06 NOTE — ED Notes (Signed)
Pt given nighttime snack. 

## 2021-09-06 NOTE — ED Notes (Signed)
Report to include Situation, Background, Assessment, and Recommendations received from [name]. Patient alert and oriented, warm and dry, in no acute distress. Patient denies SI, HI, AVH and pain. Patient made aware of Q15 minute rounds and security cameras for their safety. Patient instructed to come to me with needs or concerns.  

## 2021-09-06 NOTE — ED Notes (Signed)
Report received from Hewan M, RN including SBAR. On initial round after report Pt is warm/dry, resting quietly in room without any s/s of distress.  Will continue to monitor throughout shift as ordered for any changes in behaviors and for continued safety.   

## 2021-09-06 NOTE — ED Notes (Signed)
Hospital meal provided, pt tolerated w/o complaints.  Waste discarded appropriately.  

## 2021-09-06 NOTE — ED Provider Notes (Signed)
Emergency Medicine Observation Re-evaluation Note  Ryan Zuniga is a 41 y.o. male, seen on rounds today.  Pt initially presented to the ED for complaints of Suicidal and Homicidal Currently, the patient is chatting with the RN at the door of his room and has no acute complaints.  Physical Exam  BP (!) 126/98 (BP Location: Right Arm)   Pulse 72   Temp 98.3 F (36.8 C) (Oral)   Resp 18   Ht '5\' 8"'$  (1.727 m)   Wt 68 kg   SpO2 98%   BMI 22.81 kg/m  Physical Exam General: No distress Cardiac: Well perfused Lungs: Normal respiratory effort Psych: Calm and cooperative.  ED Course / MDM  EKG:   I have reviewed the labs performed to date as well as medications administered while in observation.  Been no significant changes in the last 24 hours.  Plan  Current plan is for social work placement.  Ryan Zuniga is not under involuntary commitment.     Arta Silence, MD 09/06/21 1133

## 2021-09-07 NOTE — ED Notes (Signed)
VOL/pending placement 

## 2021-09-07 NOTE — ED Notes (Signed)
Report received from Amy, RN including SBAR. Patient alert and oriented, warm and dry, in no acute distress. Patient denies SI, HI, AVH and pain. Patient made aware of Q15 minute rounds and security cameras for their safety. Patient instructed to come to this nurse with needs or concerns.  

## 2021-09-07 NOTE — ED Notes (Signed)
Pt given breakfast tray and beverage.  

## 2021-09-07 NOTE — ED Provider Notes (Signed)
Emergency Medicine Observation Re-evaluation Note  Ryan Zuniga is a 41 y.o. male, seen on rounds today.  Pt initially presented to the ED for complaints of Suicidal and Homicidal Currently, the patient is resting.  Physical Exam  BP (!) 151/88 (BP Location: Left Arm)   Pulse 82   Temp 98.2 F (36.8 C) (Oral)   Resp 18   Ht '5\' 8"'$  (1.727 m)   Wt 68 kg   SpO2 96%   BMI 22.81 kg/m    ED Course / MDM  EKG:   I have reviewed the labs performed to date as well as medications administered while in observation.  Recent changes in the last 24 hours include none.  Plan  Current plan is for social work/tts disposition.  Kanoa Phillippi is not under involuntary commitment.     Duffy Bruce, MD 09/07/21 [phone]

## 2021-09-07 NOTE — ED Notes (Signed)
Pt given snack and beverage. 

## 2021-09-07 NOTE — ED Notes (Signed)
Pt refusing snack and vitals at this time.

## 2021-09-07 NOTE — TOC Progression Note (Signed)
Transition of Care [facility]) - Progression Note    Patient Details  Name: Sampson Self MRN: [id number] Date of Birth: 1980/07/08  Transition of Care [facility]) CM/SW Contact  Shelbie Hutching, RN Phone Number: 09/07/2021, 12:28 PM  Clinical Narrative:    Called legal guardian, [name] with [phone] ext [phone]- message left to see if there are any updates on placement.   Called [facility] at [phone] and left a message for return call to make sure patient still on their waiting list.    Expected Discharge Plan: Group Home Barriers to Discharge: Other (must enter comment) (Group home will not take back)  Expected Discharge Plan and Services Expected Discharge Plan: Group Home   Discharge Planning Services: CM Consult   Living arrangements for the past 2 months: Group Home                 DME Arranged: N/A DME Agency: NA       HH Arranged: NA HH Agency: NA         Social Determinants of Health (SDOH) Interventions    Readmission Risk Interventions     View : No data to display.

## 2021-09-07 NOTE — ED Notes (Signed)
VOL  PENDING  PLACEMENT 

## 2021-09-07 NOTE — ED Notes (Signed)
Pt has refused vitals, snacks, and medications. Pt does not wish for staff to enter room and waves his hand in anger if staff does. Pt quiet and watching tv when alone in room. Will contineu to monitor

## 2021-09-08 NOTE — ED Notes (Signed)
Pt given snack. 

## 2021-09-08 NOTE — ED Notes (Signed)
Pt refuses to come out of room (staff assumes because of the distraction of another pt) thus is using the cups to urinate in.   Cups closes to door entry have urine in them. Staff will be notified in the morning to remove all cups from pt room.   Pt is now awake and watching tv in room.

## 2021-09-08 NOTE — ED Notes (Signed)
Ryan Zuniga w/ [facility] team c/b to verify that he had received his long term shots.  Stated that they had no idea that pt has been in the ED for 1,483 hours.  He stated that they would Start to look for placement.  Ryan Zuniga c/b # is [phone]

## 2021-09-08 NOTE — ED Notes (Signed)
Vol /pending placement 

## 2021-09-08 NOTE — ED Notes (Signed)
Dinner provided to patient, patient calm and cooperative a disposed of previous trash and cups

## 2021-09-08 NOTE — ED Notes (Signed)
Ryan Zuniga w/ [facility] @ [phone] called and stated that Ryan Zuniga is one of their clients and questions his medication(s).  Attempted to contact guardian to see if this is information that can be shared with him

## 2021-09-08 NOTE — ED Notes (Signed)
VOL/Pending Placement 

## 2021-09-08 NOTE — TOC Progression Note (Signed)
Transition of Care [facility]) - Progression Note    Patient Details  Name: Ryan Zuniga MRN: [id number] Date of Birth: [birth year]  Transition [facility] Care [facility]) CM/SW Contact  Shelbie Hutching, RN Phone Number: 09/08/2021, 2:42 PM  Clinical Narrative:    RNCM received a call from [place] with [facility] ACT team- [phone], she is wanting to schedule a team meeting with [facility], guardian, and [facility] so that everyone can be on the same page.  She would also like to schedule an assessment which I said would be great, informed them that they can set that up any time.   RNCM did reach out to [facility], left a message again for return call to see if patient on waiting list.     Expected Discharge Plan: Group Home Barriers to Discharge: Other (must enter comment) (Group home will not take back)  Expected Discharge Plan and Services Expected Discharge Plan: Group Home   Discharge Planning Services: CM Consult   Living arrangements for the past 2 months: Group Home                 DME Arranged: N/A DME Agency: NA       HH Arranged: NA HH Agency: NA         Social Determinants of Health (SDOH) Interventions    Readmission Risk Interventions     View : No data to display.

## 2021-09-08 NOTE — ED Provider Notes (Signed)
Emergency Medicine Observation Re-evaluation Note  Ryan Zuniga is a 41 y.o. male, seen on rounds today.  Pt initially presented to the ED for complaints of Suicidal and Homicidal   Physical Exam  BP (!) 145/81   Pulse 85   Temp 97.6 F (36.4 C) (Oral)   Resp 16   Ht '5\' 8"'$  (1.727 m)   Wt 68 kg   SpO2 97%   BMI 22.81 kg/m  Physical Exam General: nad Cardiac: well perufsed Lungs: unlabored   ED Course / MDM  EKG:   I have reviewed the labs performed to date as well as medications administered while in observation.  Recent changes in the last 24 hours include .  Plan  Current plan is for soc.  Ryan Zuniga is not under involuntary commitment.     [name], MD 09/08/21 0800

## 2021-09-08 NOTE — ED Notes (Addendum)
Pt awake on initial rounds.  Pt had several (>5) cups full of urine in room.  Pt helped staff to empty cups.  Staff explained to Ryan Zuniga that cups would no longer be allowed in his room.

## 2021-09-09 MED ORDER — LORAZEPAM 2 MG/ML IJ SOLN
2.0000 mg | Freq: Once | INTRAMUSCULAR | Status: AC
  Administered 2021-09-09: 2 mg via INTRAMUSCULAR
  Filled 2021-09-09: qty 1

## 2021-09-09 MED ORDER — DIPHENHYDRAMINE HCL 50 MG/ML IJ SOLN
50.0000 mg | Freq: Once | INTRAMUSCULAR | Status: AC
  Administered 2021-09-09: 50 mg via INTRAMUSCULAR
  Filled 2021-09-09: qty 1

## 2021-09-09 MED ORDER — HALOPERIDOL LACTATE 5 MG/ML IJ SOLN
5.0000 mg | Freq: Once | INTRAMUSCULAR | Status: AC
  Administered 2021-09-09: 5 mg via INTRAMUSCULAR
  Filled 2021-09-09: qty 1

## 2021-09-09 NOTE — ED Notes (Addendum)
Staff on unit with [facility] RN to talk about potentinal interview and  placement.  Pt became irate and started threaten to kill nurses aggressively posturing towards staff and being verbally abusive.  Staff contacted ED MD to obtain PRN

## 2021-09-09 NOTE — ED Notes (Signed)
Patient refused snack at this time. 

## 2021-09-09 NOTE — ED Notes (Signed)
Report received from Katie, RN including SBAR. Patient alert and oriented, warm and dry, in no acute distress. Patient denies SI, HI, AVH and pain. Patient made aware of Q15 minute rounds and security cameras for their safety. Patient instructed to come to this nurse with needs or concerns.  

## 2021-09-09 NOTE — ED Notes (Signed)
Pt vol/pending placement

## 2021-09-09 NOTE — ED Notes (Signed)
Pt given snack. 

## 2021-09-09 NOTE — ED Notes (Signed)
Patient provided with snack and beverage at this time.

## 2021-09-09 NOTE — ED Notes (Signed)
Meal tray given 

## 2021-09-09 NOTE — ED Notes (Signed)
Report received from Kimberly S , RN including SBAR. On initial round after report Pt is warm/dry, resting quietly in room without any s/s of distress.  Will continue to monitor throughout shift as ordered for any changes in behaviors and for continued safety.   

## 2021-09-09 NOTE — ED Notes (Signed)
Patient provided snack at appropriate snack time.  Pt consumed 100% of snack provided, tolerated well w/o complaints   Trash disposted of appropriately by patient.  

## 2021-09-09 NOTE — TOC Progression Note (Signed)
Transition of Care [facility]) - Progression Note    Patient Details  Name: Jabarri Stefanelli MRN: [id number] Date of Birth: March 03, 1981  Transition of [facility]) CM/SW Contact  Shelbie Hutching, RN Phone Number: 09/09/2021, 1:28 PM  Clinical Narrative:    Received a call from [place] with [employer] guardianship, she has spoken with [facility] ACT team member Gena Monia Pouch- Gena will be scheduling a teams meeting next week.  They have leads on 3 group living high placements, [facility] -[phone], is with [facility] and will be calling to schedule a virtual screening for [facility] group home in [place] and for [place] home.    Expected Discharge Plan: Group Home Barriers to Discharge: Other (must enter comment) (Group home will not take back)  Expected Discharge Plan and Services Expected Discharge Plan: Group Home   Discharge Planning Services: CM Consult   Living arrangements for the past 2 months: Group Home                 DME Arranged: N/A DME Agency: NA       HH Arranged: NA HH Agency: NA         Social Determinants of Health (SDOH) Interventions    Readmission Risk Interventions     View : No data to display.

## 2021-09-10 NOTE — ED Notes (Signed)
Pt given dinner tray.

## 2021-09-10 NOTE — ED Notes (Signed)
Report to include Situation, Background, Assessment, and Recommendations received from Ariel RN. Patient alert and oriented, warm and dry, in no acute distress. Patient denies SI, HI, AVH and pain. Patient made aware of Q15 minute rounds and security cameras for their safety. Patient instructed to come to me with needs or concerns.  

## 2021-09-10 NOTE — ED Notes (Signed)
Pt given a cup of water at this time. Pt ambulatory to the bathroom.

## 2021-09-10 NOTE — ED Notes (Signed)
Snack and drink given

## 2021-09-10 NOTE — ED Provider Notes (Signed)
Emergency Medicine Observation Re-evaluation Note  Jancarlos Thrun is a 41 y.o. male, seen on rounds today.  Pt initially presented to the ED for complaints of Suicidal and Homicidal  Currently, the patient is resting in bed - no events overnight per [facility] nurse   Physical Exam  Blood pressure 131/89, pulse 86, temperature 98.3 F (36.8 C), temperature source Oral, resp. rate 17, height '5\' 8"'$  (1.727 m), weight 68 kg, SpO2 96 %.  Physical Exam General: No apparent distress Pulm: Normal WOB Neuro: asleep in bed     ED Course / MDM     I have reviewed the labs performed to date as well as medications administered while in observation.  Recent changes in the last 24 hours include  none  Plan   Current plan is to continue to wait for soc placement Patient is not under full IVC at this time.   Vanessa [place], MD 09/10/21 [phone]

## 2021-09-10 NOTE — ED Notes (Signed)
Pt given lunch tray by RN

## 2021-09-10 NOTE — ED Notes (Signed)
VOL/Pending Placement 

## 2021-09-10 NOTE — ED Notes (Signed)
Pt given snack and drink. No other needs at this time.

## 2021-09-11 NOTE — ED Notes (Signed)
Pt's breakfast arrived, Probation officer provided pt with breakfast. Pt sitting up on his bed when Probation officer entered. Pt sitting up now and eating his breakfast.

## 2021-09-11 NOTE — ED Notes (Signed)
Patient refused lunch tray but requested a sprite to drink. Drink and blanket given.

## 2021-09-11 NOTE — ED Notes (Signed)
Dinner tray provided to patient who refused, RN slide tray under patients door and he threw it back out stating he does not want it

## 2021-09-11 NOTE — TOC Progression Note (Signed)
Transition of Care [facility]) - Progression Note    Patient Details  Name: Ryan Zuniga MRN: [id number] Date of Birth: March 19, 1981  Transition of Care [facility]) CM/SW Contact  Shelbie Hutching, RN Phone Number: 09/11/2021, 3:29 PM  Clinical Narrative:    Virtual interview with [facility] for 2 potential group homes scheduled for Tuesday at 11am.     Expected Discharge Plan: Group Home Barriers to Discharge: Other (must enter comment) (Group home will not take back)  Expected Discharge Plan and Services Expected Discharge Plan: Group Home   Discharge Planning Services: CM Consult   Living arrangements for the past 2 months: Group Home                 DME Arranged: N/A DME Agency: NA       HH Arranged: NA HH Agency: NA         Social Determinants of Health (SDOH) Interventions    Readmission Risk Interventions     View : No data to display.

## 2021-09-11 NOTE — ED Notes (Signed)
VOl pending interview with two groups homes Tuesday 5/30

## 2021-09-11 NOTE — ED Notes (Signed)
Pt received snack and drink

## 2021-09-11 NOTE — ED Notes (Signed)
Report to include Situation, Background, Assessment, and Recommendations received from Crystal RN. Patient alert and oriented, warm and dry, in no acute distress. Patient denies SI, HI, AVH and pain. Patient made aware of Q15 minute rounds and security cameras for their safety. Patient instructed to come to me with needs or concerns.  

## 2021-09-11 NOTE — ED Provider Notes (Signed)
Emergency Medicine Observation Re-evaluation Note  Ryan Zuniga is a 41 y.o. male, seen on rounds today.    Physical Exam  BP (!) 143/85 (BP Location: Left Arm)   Pulse 87   Temp 98.4 F (36.9 C) (Oral)   Resp 16   Ht '5\' 8"'$  (1.727 m)   Wt 68 kg   SpO2 97%   BMI 22.81 kg/m  Physical Exam General: Patient resting comfortably in bed Lungs: Patient not in respiratory distress Psych: Patient not combative  ED Course / MDM  EKG:     Plan  Current plan is for social work evaluation and placement  Ryan Zuniga is not under involuntary commitment.     Nena Polio, MD 09/11/21 [phone]

## 2021-09-11 NOTE — ED Notes (Signed)
Pt's father has called, pt stated that they did not want to talk to pt's father. Writer told pt that, pt did ask for a cup of coffee. In return, pt cleaned up the 5 cups in his room.

## 2021-09-11 NOTE — ED Notes (Signed)
Pt did not want his snack this morning

## 2021-09-11 NOTE — ED Notes (Signed)
Patient refused His room being cleaned today per EVS.

## 2021-09-11 NOTE — ED Notes (Signed)
Pt's second tray of breakfast has arrived, pt sitting up and eating his breakfast.

## 2021-09-11 NOTE — ED Notes (Signed)
Pt requested a sprite. RN allowed pt to have a sprite, Probation officer provided one to pt.

## 2021-09-11 NOTE — ED Notes (Signed)
RN to bedside to assess overdue vitals. Pt refusing vitals and yelling at staff.

## 2021-09-12 NOTE — ED Notes (Signed)
VOL/Pending Placement 

## 2021-09-12 NOTE — ED Notes (Signed)
Report to include Situation, Background, Assessment, and Recommendations received from Amy RN. Patient alert and oriented, warm and dry, in no acute distress. Patient denies SI, HI, AVH and pain. Patient made aware of Q15 minute rounds and security cameras for their safety. Patient instructed to come to me with needs or concerns.

## 2021-09-12 NOTE — ED Notes (Signed)
Pt given snack. 

## 2021-09-12 NOTE — ED Notes (Signed)
Pt allowed RN to collect 10 cups of water from his room.

## 2021-09-12 NOTE — ED Notes (Signed)
RN collected 10 cups full of water from pt's room.

## 2021-09-12 NOTE — ED Provider Notes (Signed)
Emergency Medicine Observation Re-evaluation Note  Ryan Zuniga is a 41 y.o. male, seen on rounds today.    Physical Exam  BP 122/89   Pulse 70   Temp 97.7 F (36.5 C) (Oral)   Resp 17   Ht '5\' 8"'$  (1.727 m)   Wt 68 kg   SpO2 97%   BMI 22.81 kg/m  Physical Exam General: Patient resting comfortably on the bed Lungs: Patient in no respiratory distress Psych: Patient is not combative  ED Course / MDM  EKG:    Plan  Current plan is for social work evaluation and placement  Ryan Zuniga is not under involuntary commitment.     Nena Polio, MD 09/12/21 [phone]

## 2021-09-12 NOTE — ED Notes (Signed)
Pt given breakfast tray and beverage. Pt declined tray but kept beverage

## 2021-09-12 NOTE — ED Notes (Signed)
Pt received snack and drink

## 2021-09-12 NOTE — ED Notes (Signed)
Dinner tray given

## 2021-09-13 NOTE — ED Notes (Signed)
Pt brought 7 cups to the trash

## 2021-09-13 NOTE — ED Provider Notes (Signed)
Emergency Medicine Observation Re-evaluation Note  Ryan Zuniga is a 41 y.o. male, seen on rounds today.  Pt initially presented to the ED for complaints of Suicidal and Homicidal Currently, the patient is resting comfortably.  Physical Exam  BP 140/77   Pulse 85   Temp 98.2 F (36.8 C) (Oral)   Resp 17   Ht '5\' 8"'$  (1.727 m)   Wt 68 kg   SpO2 96%   BMI 22.81 kg/m  Physical Exam General: No acute distress Cardiac: Well-perfused extremities Lungs: No respiratory distress Psych: Appropriate mood and affect  ED Course / MDM  EKG:   I have reviewed the labs performed to date as well as medications administered while in observation.  Recent changes in the last 24 hours include none.  Plan  Current plan is for placement.  Ryan Zuniga is not under involuntary commitment.     Naaman Plummer, MD 09/13/21 [phone]

## 2021-09-13 NOTE — ED Notes (Signed)
VOL/pending placement 

## 2021-09-13 NOTE — ED Notes (Signed)
Pt refused vitals 

## 2021-09-13 NOTE — ED Notes (Signed)
Report to include Situation, Background, Assessment, and Recommendations received from Ariel RN. Patient alert and oriented, warm and dry, in no acute distress. Patient denies SI, HI, AVH and pain. Patient made aware of Q15 minute rounds and security cameras for their safety. Patient instructed to come to me with needs or concerns.  

## 2021-09-13 NOTE — ED Notes (Signed)
Pt given lunch tray.

## 2021-09-13 NOTE — ED Notes (Signed)
PT resting comfortably with no signs of acute distress

## 2021-09-13 NOTE — ED Notes (Signed)
Pt declined breakfast.

## 2021-09-13 NOTE — ED Notes (Signed)
Pt received dinner tray.

## 2021-09-13 NOTE — ED Notes (Signed)
Pt provided snack and beverage. EVS mopped pt's floor. Pt changed bed sheets. Pt threw out 7 cups and two bowels.

## 2021-09-14 NOTE — ED Notes (Signed)
Pt refused vitals at this time.

## 2021-09-14 NOTE — TOC Progression Note (Signed)
Transition of Care [facility]) - Progression Note    Patient Details  Name: Terrall Bley MRN: [id number] Date of Birth: [birth year]  Transition [facility]) CM/SW [place], LCSW Phone Number: 09/14/2021, 2:22 PM  Clinical Narrative:     Patient pending placement offers.  [facility] ACT team member Gena Monia Pouch- Gena will be scheduling a teams meeting next week.  Possible leads on 3 group living high placements. [facility] -[phone], with [facility] and will schedule a virtual screening for [place] group home in [place] and for [place] home.   Expected Discharge Plan: Group Home Barriers to Discharge: Other (must enter comment) (Group home will not take back)  Expected Discharge Plan and Services Expected Discharge Plan: Group Home   Discharge Planning Services: CM Consult   Living arrangements for the past 2 months: Group Home                 DME Arranged: N/A DME Agency: NA       HH Arranged: NA HH Agency: NA         Social Determinants of Health (SDOH) Interventions    Readmission Risk Interventions     View : No data to display.

## 2021-09-14 NOTE — ED Notes (Signed)
Lunch tray provided with beverage. Pt ate 100% of meal.

## 2021-09-14 NOTE — ED Notes (Signed)
Pt denied breakfast but requested a soda. Lemon lime soda given at this time.

## 2021-09-14 NOTE — ED Notes (Signed)
Pt out of room asking for sprite and crackers. Provided by this nurse. Offered dinner tray again and pt states he does not want it and to give it to someone else.

## 2021-09-14 NOTE — ED Notes (Signed)
Snack and beverage given. 

## 2021-09-14 NOTE — ED Notes (Signed)
Pt offered dinner tray. Refused tray and beverage.

## 2021-09-14 NOTE — ED Notes (Signed)

## 2021-09-14 NOTE — ED Provider Notes (Signed)
Emergency Medicine Observation Re-evaluation Note  Ryan Zuniga is a 41 y.o. male, seen on rounds today.  Pt initially presented to the ED for complaints of Suicidal and Homicidal   Physical Exam  BP 126/88 (BP Location: Right Arm)   Pulse 80   Temp 98.2 F (36.8 C) (Oral)   Resp 18   Ht '5\' 8"'$  (1.727 m)   Wt 68 kg   SpO2 96%   BMI 22.81 kg/m  Physical Exam General: NAd Cardiac: well perfused Lungs: unlabored Psych: resting  ED Course / MDM  EKG:   I have reviewed the labs performed to date as well as medications administered while in observation.    Plan  Current plan is for psych/soc.  Ryan Zuniga is not under involuntary commitment.     Ryan Lot, MD 09/14/21 [phone]

## 2021-09-15 NOTE — ED Notes (Signed)
VOL  PENDING  PLACEMENT 

## 2021-09-15 NOTE — ED Notes (Signed)
Report received from Katie, RN including SBAR. Patient alert and oriented, warm and dry, in no acute distress. Patient denies SI, HI, AVH and pain. Patient made aware of Q15 minute rounds and security cameras for their safety. Patient instructed to come to this nurse with needs or concerns.  

## 2021-09-15 NOTE — ED Notes (Signed)
Pt refused vitals at this time. Will try again later

## 2021-09-15 NOTE — ED Notes (Signed)
Pt asleep at this time. Will try for vitals again later

## 2021-09-15 NOTE — ED Notes (Signed)
Pt given dinner tray and sprite.

## 2021-09-15 NOTE — ED Notes (Signed)
Pt given snack. 

## 2021-09-15 NOTE — ED Notes (Signed)
Report received from Kimberly S , RN including SBAR. On initial round after report Pt is warm/dry, resting quietly in room without any s/s of distress.  Will continue to monitor throughout shift as ordered for any changes in behaviors and for continued safety.   

## 2021-09-16 NOTE — ED Notes (Signed)
Patient given dinner tray.

## 2021-09-16 NOTE — ED Notes (Signed)
Report received from Lukas B, RN including SBAR. On initial round after report Pt is warm/dry, resting quietly in room without any s/s of distress.  Will continue to monitor throughout shift as ordered for any changes in behaviors and for continued safety.   

## 2021-09-16 NOTE — ED Notes (Addendum)
Patient refused afternoon snack.

## 2021-09-16 NOTE — ED Notes (Signed)
VOLUNTARY continues to await TOC placement 

## 2021-09-16 NOTE — ED Notes (Signed)
Patient refusing snack and vitals. Patient  cursing at staff to get out of room.

## 2021-09-16 NOTE — ED Notes (Addendum)
Patient refused lunch tray.

## 2021-09-16 NOTE — ED Provider Notes (Signed)
Emergency Medicine Observation Re-evaluation Note  Ryan Zuniga is a 41 y.o. male, seen on rounds today.  Pt initially presented to the ED for complaints of Suicidal and Homicidal    Physical Exam  BP (!) 140/96   Pulse 79   Temp 97.8 F (36.6 C) (Oral)   Resp 18   Ht '5\' 8"'$  (1.727 m)   Wt 68 kg   SpO2 97%   BMI 22.81 kg/m  Physical Exam General: No distress Cardiac: Well perfused Lungs: Normal respiratory effort Psych: Calm and cooperative  ED Course / MDM  EKG:   I have reviewed the labs performed to date as well as medications administered while in observation.  There have been no significant changes to his status in the last 24 hours.  Plan  Current plan is for social work placement.  Samnang Shugars is not under involuntary commitment.       Delman Kitten, MD 09/16/21 0800

## 2021-09-16 NOTE — ED Notes (Signed)
VOL/pending placement 

## 2021-09-16 NOTE — ED Notes (Signed)
Patient refusing medications. Patient states, "I don't want that fucking medicine. You don't own me HAHAHA."

## 2021-09-16 NOTE — ED Notes (Signed)
Pt refusing vitals and snack at this time.

## 2021-09-16 NOTE — ED Provider Notes (Signed)
Emergency Medicine Observation Re-evaluation Note  Ryan Zuniga is a 41 y.o. male, seen on rounds today.  Pt initially presented to the ED for complaints of Suicidal and Homicidal Currently, the patient is resting comfortably in his room and has no acute complaints.  Physical Exam  BP (!) 140/96   Pulse 79   Temp 97.8 F (36.6 C) (Oral)   Resp 18   Ht '5\' 8"'$  (1.727 m)   Wt 68 kg   SpO2 97%   BMI 22.81 kg/m  Physical Exam General: No distress Cardiac: Well perfused Lungs: Normal respiratory effort Psych: Calm and cooperative  ED Course / MDM  EKG:   I have reviewed the labs performed to date as well as medications administered while in observation.  There have been no significant changes to his status in the last 24 hours.  Plan  Current plan is for social work placement.  Kanan Sobek is not under involuntary commitment.     Arta Silence, MD 09/16/21 [phone]

## 2021-09-16 NOTE — ED Notes (Signed)
Pt. Breakfast tray given at 0900

## 2021-09-17 NOTE — ED Notes (Signed)
VOL/Pending Placement 

## 2021-09-17 NOTE — ED Notes (Signed)
Report to include Situation, Background, Assessment, and Recommendations received from Amy RN. Patient alert and oriented, warm and dry, in no acute distress. Patient denies SI, HI, AVH and pain. Patient made aware of Q15 minute rounds and security cameras for their safety. Patient instructed to come to me with needs or concerns.

## 2021-09-17 NOTE — TOC Progression Note (Signed)
Transition of Care [facility]) - Progression Note    Patient Details  Name: Syd Newsome MRN: [id number] Date of Birth: Mar 09, 1981  [facility] of [facility]) CM/SW Contact  Shelbie Hutching, RN Phone Number: 09/17/2021, 5:05 PM  Clinical Narrative:    Meeting with Beverly Sessions rescheduled for Tuesday at 31 am.  RNCM spoke with patient about this today and he is very interested in the interview and will be happy to participate.  He wants to get out of the hospital.     Expected Discharge Plan: Group Home Barriers to Discharge: Other (must enter comment) (Group home will not take back)  Expected Discharge Plan and Services Expected Discharge Plan: Group Home   Discharge Planning Services: CM Consult   Living arrangements for the past 2 months: Group Home                 DME Arranged: N/A DME Agency: NA       HH Arranged: NA HH Agency: NA         Social Determinants of Health (SDOH) Interventions    Readmission Risk Interventions     View : No data to display.

## 2021-09-17 NOTE — ED Notes (Addendum)
Pt asking for a drink. EDT asked patient to bring 1 cup to nurse's station in exchange for a drink.  Pt became agitated and gave staff the finger.  Pt returned to his room, held up a cup to the camera and gave the finger a 2nd time.

## 2021-09-17 NOTE — ED Notes (Signed)
Snack and beverage given. 

## 2021-09-17 NOTE — ED Notes (Signed)
BREAKFAST TRAY GIVEN 

## 2021-09-17 NOTE — ED Notes (Signed)
DR. Kerman Passey AT BEDSIDE ATTEMPTING TO TALK PT. PT YELLED AT DR., "GET THE Wink," MULTIPLE TIMES.

## 2021-09-17 NOTE — ED Notes (Signed)
Patient given dinner tray.

## 2021-09-17 NOTE — ED Provider Notes (Signed)
Emergency Medicine Observation Re-evaluation Note  Ryan Zuniga is a 41 y.o. male, seen on rounds today.  Pt initially presented to the ED for complaints of Suicidal and Homicidal No acute events reported overnight  Physical Exam  BP 134/90 (BP Location: Left Arm)   Pulse 77   Temp 97.9 F (36.6 C) (Oral)   Resp 17   Ht '5\' 8"'$  (1.727 m)   Wt 68 kg   SpO2 98%   BMI 22.81 kg/m  Physical Exam General: Initially sleeping, upon waking up patient became very agitated.  Yelling "Get the fuck out of my room" Cardiac: Good signs of peripheral circulation Lungs: Speaking without issue.  No distress. Psych: Agitated  ED Course / MDM   No recent lab work for review.  Plan  Current plan is for placement to an appropriate living facility once available.[name] is not under involuntary commitment.     Harvest Dark, MD 09/17/21 [phone]

## 2021-09-17 NOTE — ED Notes (Addendum)
EDT able to collect 10-12 cups and 6 styrofoam boxes from patient's room.  Pt given 6 clean cups.

## 2021-09-18 NOTE — ED Notes (Signed)
Snack and beverage given. 

## 2021-09-18 NOTE — ED Notes (Signed)
Report to include Situation, Background, Assessment, and Recommendations received from Amy RN. Patient alert and oriented, warm and dry, in no acute distress. Patient denies SI, HI, AVH and pain. Patient made aware of Q15 minute rounds and security cameras for their safety. Patient instructed to come to me with needs or concerns.

## 2021-09-18 NOTE — ED Notes (Signed)
Pt to dayroom asking for a drink.  Staff asked patient to come to nurse's station door with intent of asking him to bring cups from his room.   Pt became agitated and began cursing at staff.

## 2021-09-18 NOTE — ED Notes (Signed)
VOl pending placement 

## 2021-09-18 NOTE — ED Provider Notes (Signed)
Emergency Medicine Observation Re-evaluation Note  Ryan Zuniga is a 41 y.o. male, seen on rounds today.  Pt initially presented to the ED for complaints of Suicidal and Homicidal   Physical Exam  BP 125/85   Pulse 76   Temp 98.3 F (36.8 C)   Resp 18   Ht 1.727 m ('5\' 8"'$ )   Wt 68 kg   SpO2 97%   BMI 22.81 kg/m  Physical Exam General: No acute distress   ED Course / MDM  No change since yesterday  Plan  Current plan is for placement.  Ryan Zuniga is not under involuntary commitment.     Lavonia Drafts, MD 09/18/21 [phone]

## 2021-09-18 NOTE — ED Notes (Signed)
Patient refused lunch tray. Patient given sprite.

## 2021-09-18 NOTE — ED Notes (Signed)
Pt refusing to give staff any cups.  RN estimates pt to have close to 20 cups in his room. Due to non compliance pt was not given extra snack or Sprite.   *next snack time is not until night shift

## 2021-09-18 NOTE — ED Notes (Addendum)
PT given snack and beverage

## 2021-09-18 NOTE — ED Notes (Signed)
Pt gave security officer 5 cups.

## 2021-09-18 NOTE — ED Notes (Signed)
Pt's father on unit to visit.  Pt became agitated, threw his doors on the floor and attempted to follow security and his father to the sally port.  Pt making threats to kill his father.  Pt cursing and making punching motions.  Psychiatry made aware of incident.  PO PRN medication given for agitation.   Pt's father brought: $71 and 2 phone chargers  Items placed in patient belongings

## 2021-09-18 NOTE — ED Notes (Addendum)
Pt restless and irritable for most of the shift.  Psychiatry made aware the new medication added in May does not seem to be helping with agitation.

## 2021-09-19 NOTE — ED Notes (Signed)
Report to include Situation, Background, Assessment, and Recommendations received from [name]. Patient alert and oriented, warm and dry, in no acute distress. Patient denies SI, HI, AVH and pain. Patient made aware of Q15 minute rounds and security cameras for their safety. Patient instructed to come to me with needs or concerns.  

## 2021-09-19 NOTE — ED Notes (Addendum)
Report received from Malachi Pro, Conservation officer, nature. On initial round after report Pt is warm/dry, sleeping quietly in room without any s/s of distress.  Pt room is full of styrofoam cups, with some containing a dark liquid and some with clear liquid. Staff to attempt to remove cups when patient wakes up.  Will continue to monitor throughout shift as ordered for any changes in behaviors and for continued safety.

## 2021-09-19 NOTE — ED Notes (Signed)
VOL/Pending Placement 

## 2021-09-19 NOTE — ED Notes (Signed)
Pt reluctantly removed all cups from room.  Room was mopped by staff.  Six clean cups were given per agreement with pt.

## 2021-09-19 NOTE — ED Notes (Signed)
Hospital meal provided, pt tolerated w/o complaints.  Waste discarded appropriately.  

## 2021-09-19 NOTE — ED Provider Notes (Signed)
Emergency Medicine Observation Re-evaluation Note  Ryan Zuniga is a 41 y.o. male, seen on rounds today.  Pt initially presented to the ED for complaints of Suicidal and Homicidal   Physical Exam  BP 122/83 (BP Location: Left Arm)   Pulse 65   Temp 98 F (36.7 C) (Oral)   Resp 18   Ht '5\' 8"'$  (1.727 m)   Wt 68 kg   SpO2 96%   BMI 22.81 kg/m  Physical Exam General: Nad  ED Course / MDM  EKG:   I have reviewed the labs performed to date as well as medications administered while in observation.   Plan  Current plan is for soc/psych.  Montrelle Eddings is not under involuntary commitment.     [name], MD 09/19/21 [phone]

## 2021-09-19 NOTE — ED Notes (Signed)
Pt came to staff requesting "can I get a drink"  Pt has approx. 20+ styrofoam cups full of liquid in his room.  Staff explained that if you will bring [place] & get rid of a few cups we can make an exchange.  Pt became upset and stated "Never mind, I'm good" and walked away

## 2021-09-20 NOTE — ED Notes (Signed)
Unlocked bathroom door to allow patient to shower.  Staff continuously monitored dayroom outside of bathroom door during pt shower.  Pt was given hygiene items as well as:  I clean top, 1 clean bottom, with 1 pair of disposable underwear.  Pt changed out into clean clothing.  Staff disposed of all shower supplies. Shower room cleaned and secured for next use.  

## 2021-09-20 NOTE — ED Notes (Signed)
Report to include Situation, Background, Assessment, and Recommendations received from [name]. Patient alert and oriented, warm and dry, in no acute distress. Patient denies SI, HI, AVH and pain. Patient made aware of Q15 minute rounds and security cameras for their safety. Patient instructed to come to me with needs or concerns.  

## 2021-09-20 NOTE — ED Notes (Signed)
Snack and beverage given. 

## 2021-09-20 NOTE — ED Notes (Signed)
dinner tray given

## 2021-09-20 NOTE — ED Notes (Signed)
Patient refused to get vital signs and snacks. Will try again.

## 2021-09-20 NOTE — ED Notes (Signed)
Hospital meal provided, pt tolerated w/o complaints.  Waste discarded appropriately.  

## 2021-09-20 NOTE — ED Notes (Signed)
Pt given lunch tray but refused. Pt took drink only.

## 2021-09-20 NOTE — ED Notes (Signed)
VOL/pending placement 

## 2021-09-20 NOTE — ED Notes (Signed)
Report received from Ryan M, RN including SBAR. On initial round after report Pt is warm/dry, resting quietly in room without any s/s of distress.  Will continue to monitor throughout shift as ordered for any changes in behaviors and for continued safety.   

## 2021-09-21 NOTE — ED Notes (Signed)
RN told patient he would not be given coffee due to his behavior.  Pt upset and wafting to speak to my boss.

## 2021-09-21 NOTE — ED Provider Notes (Signed)
Emergency Medicine Observation Re-evaluation Note  Ryan Zuniga is a 41 y.o. male, seen on rounds today.  Pt initially presented to the ED for complaints of Suicidal and Homicidal   Physical Exam  BP (!) 135/91 (BP Location: Right Arm)   Pulse 75   Temp 97.8 F (36.6 C) (Oral)   Resp 18   Ht '5\' 8"'$  (1.727 m)   Wt 68 kg   SpO2 95%   BMI 22.81 kg/m  Physical Exam General: NAD Cardiac: Well perfused, no cyanosis. Lungs: Breathing comfortably.  ED Course / MDM  EKG:   I have reviewed the labs performed to date as well as medications administered while in observation.  Recent changes in the last 24 hours include none.  Plan  Current plan is for psych/TOC.  Ryan Zuniga is not under involuntary commitment.     Blake Divine, MD 09/21/21 1043

## 2021-09-21 NOTE — ED Notes (Signed)
Lunch tray given. 

## 2021-09-21 NOTE — ED Notes (Signed)
Pt called 911 from patient phone.

## 2021-09-21 NOTE — ED Notes (Signed)
Pt asking for coffee.  RN explained to patient this was not an option.  Pt then came to nurse's station door asking for Sprite.  Pt was asked to allow [place] to get his VS first.  Pt became agitated and cursing at staff and returned to his room.

## 2021-09-21 NOTE — ED Notes (Signed)
Pt requesting a snack.  Pt told he would need to take his scheduled medications  to have a snack.  . Pt complaint.

## 2021-09-21 NOTE — ED Notes (Signed)
Meal given to pt. And patient refused

## 2021-09-21 NOTE — ED Notes (Signed)
Pt refused vitals and snack

## 2021-09-21 NOTE — ED Notes (Signed)
Patient refused to have housekeeping to clean room

## 2021-09-21 NOTE — ED Notes (Signed)
Patient given dinner tray.

## 2021-09-21 NOTE — TOC Progression Note (Signed)
Transition of Care [facility]) - Progression Note    Patient Details  Name: Ryan Zuniga MRN: [id number] Date of Birth: 25-Mar-1981  Transition of Care [facility]) CM/SW Contact  Shelbie Hutching, RN Phone Number: 09/21/2021, 2:18 PM  Clinical Narrative:    [employer] Teams meeting with [facility] scheduled for tomorrow at 11 am.  Patient has been very angry and agitated today, refusing medications.  Discuss with patient last week about meeting for tomorrow and he seemed excited and willing to do anything to help himself get out of the hospital.  [facility] will see how patient is doing in the morning, may need to postpone meeting.     Expected Discharge Plan: Group Home Barriers to Discharge: Other (must enter comment) (Group home will not take back)  Expected Discharge Plan and Services Expected Discharge Plan: Group Home   Discharge Planning Services: CM Consult   Living arrangements for the past 2 months: Group Home                 DME Arranged: N/A DME Agency: NA       HH Arranged: NA HH Agency: NA         Social Determinants of Health (SDOH) Interventions    Readmission Risk Interventions     View : No data to display.

## 2021-09-21 NOTE — ED Notes (Signed)
Pt refuses to take medications after speaking with NP, RN, EDT and security.  Pt is agitated and and keeps repeating, "I don't give a fuck."

## 2021-09-21 NOTE — ED Notes (Signed)
Pt remains uncooperative with staff and will not allow VS to be taken.

## 2021-09-22 NOTE — ED Notes (Signed)
Patient was upset and refused medications, and just kept repeating " no thank you" Nurse ask if He would like a snack or cold drink and he said " No" Nurse went back also to let him know that someone would be coming to talk to him at 11 am about placement and He states " I don't want to talk" Nurse gave him a little time alone and went back to talk to him and He apologized and said that He wants to take His medicine and He does want to talk to someone when they come. Patient is very pleasant and cooperative.

## 2021-09-22 NOTE — TOC Progression Note (Signed)
Transition of Care [facility]) - Progression Note    Patient Details  Name: Ryan Zuniga MRN: [id number] Date of Birth: Jun 10, 1980  Transition of Care [facility]) CM/SW Contact  Shelbie Hutching, RN Phone Number: 09/22/2021, 4:43 PM  Clinical Narrative:    Meeting completed with 2 of [facility] group [facility] this morning.  Patient was calm and cooperative and the meeting seemed to go well.   Patient was honest.  RNCM will reach out t [facility] tomorrow to see if they need any additional information and what there decision will be.  Dominique from [facility] also called and requests to be notified with decision- if they cannot accept patient she will cont to make phone calls and look for placement.     Expected Discharge Plan: Group Home Barriers to Discharge: Other (must enter comment) (Group home will not take back)  Expected Discharge Plan and Services Expected Discharge Plan: Group Home   Discharge Planning Services: CM Consult   Living arrangements for the past 2 months: Group Home                 DME Arranged: N/A DME Agency: NA       HH Arranged: NA HH Agency: NA         Social Determinants of Health (SDOH) Interventions    Readmission Risk Interventions     View : No data to display.

## 2021-09-22 NOTE — ED Notes (Signed)
Patient up to the bathroom, no signs of distress.

## 2021-09-22 NOTE — ED Provider Notes (Signed)
Emergency Medicine Observation Re-evaluation Note  Ryan Zuniga is a 41 y.o. male, seen on rounds today.  Pt initially presented to the ED for complaints of Suicidal and Homicidal   Physical Exam  BP 134/84 (BP Location: Left Arm)   Pulse 81   Temp 98.1 F (36.7 C) (Oral)   Resp 18   Ht '5\' 8"'$  (1.727 m)   Wt 68 kg   SpO2 99%   BMI 22.81 kg/m  Physical Exam General: NAD Reports no concerns this morning.  ED Course / MDM  EKG:   I have reviewed the labs performed to date as well as medications administered while in observation.  Recent changes in the last 24 hours include none.  Plan  Current plan is for psych/TOC.  Ryan Zuniga is not under involuntary commitment.    Delman Kitten, MD 09/22/21 [phone]

## 2021-09-22 NOTE — ED Notes (Signed)
VOL  PENDING  PLACEMENT 

## 2021-09-22 NOTE — ED Notes (Signed)
Patient received lunch tray with beverage, will continue to monitor.

## 2021-09-22 NOTE — ED Notes (Signed)
Patient ate 100% of meal and beverage.

## 2021-09-22 NOTE — ED Notes (Signed)
VOL/Pending TOC Placement 

## 2021-09-23 NOTE — ED Notes (Signed)
Patient ate 100% of supper and beverage.  

## 2021-09-23 NOTE — ED Notes (Signed)
Pt given snack. 

## 2021-09-23 NOTE — ED Notes (Signed)

## 2021-09-23 NOTE — ED Notes (Signed)
Pt given lunch tray.

## 2021-09-23 NOTE — ED Notes (Signed)
Breakfast and beverage served.

## 2021-09-23 NOTE — ED Notes (Signed)
Patient refused His beverage tray only wants soft drink.

## 2021-09-23 NOTE — ED Notes (Signed)
Vol /pending placement 

## 2021-09-23 NOTE — ED Notes (Signed)
EVS offering to clean pt's room, pt refusing at this time.

## 2021-09-23 NOTE — ED Notes (Signed)
Patient at nursing station, ask for drink, Tech gave him a sprite, no signs of distress, Patient is safe, and camera surveillance in operation for security.

## 2021-09-24 NOTE — ED Notes (Signed)
VOL/Pending Placement 

## 2021-09-24 NOTE — ED Notes (Signed)
Patient refused breakfast tray and vitals at this time, Reported to [employer].

## 2021-09-24 NOTE — ED Notes (Signed)
Pt given snack. 

## 2021-09-24 NOTE — ED Notes (Signed)
This writer attempted to obtain pt's vital signs and to give pt's nighttime medication. Pt refused to let RN obtain vitals and to take medication.

## 2021-09-24 NOTE — ED Notes (Signed)
Breakfast tray provided; refused

## 2021-09-24 NOTE — ED Notes (Signed)
Pt is alert, calm and cooperative. Pt denies any HI or SI. Pt denies any active auditory or visual hallucinations.

## 2021-09-24 NOTE — ED Notes (Signed)
VOL/pending placement 

## 2021-09-24 NOTE — ED Notes (Signed)
Pt agreed to take nighttime medications willingly and let staff obtain vital signs.

## 2021-09-24 NOTE — TOC Progression Note (Signed)
Transition of Care [facility]) - Progression Note    Patient Details  Name: Ryan Zuniga MRN: [id number] Date of Birth: May 02, 1980  Transition of Care [facility]) CM/SW Contact  Shelbie Hutching, RN Phone Number: 09/24/2021, 9:08 AM  Clinical Narrative:    Reached out to both [facility] and patient's guardian via email for update to see if either group home from the interview on Tuesday can offer.  Dominique with [facility] called and will cont to reach out to other group homes for placement.     Expected Discharge Plan: Group Home Barriers to Discharge: Other (must enter comment) (Group home will not take back)  Expected Discharge Plan and Services Expected Discharge Plan: Group Home   Discharge Planning Services: CM Consult   Living arrangements for the past 2 months: Group Home                 DME Arranged: N/A DME Agency: NA       HH Arranged: NA HH Agency: NA         Social Determinants of Health (SDOH) Interventions    Readmission Risk Interventions     No data to display

## 2021-09-24 NOTE — ED Notes (Signed)
Refused dinner tray. 

## 2021-09-24 NOTE — ED Provider Notes (Signed)
Emergency Medicine Observation Re-evaluation Note  Ryan Zuniga is a 42 y.o. male, seen on rounds today.  Pt initially presented to the ED for complaints of Suicidal and Homicidal No acute events overnight.  Physical Exam  BP 121/83 (BP Location: Left Arm)   Pulse 86   Temp 98.6 F (37 C) (Oral)   Resp 18   Ht '5\' 8"'$  (1.727 m)   Wt 68 kg   SpO2 96%   BMI 22.81 kg/m    ED Course / MDM   No recent lab work for review  Plan  Current plan is for placement to an appropriate living facility once available.  Breeze Angell is not under involuntary commitment.     Harvest Dark, MD 09/24/21 [phone]

## 2021-09-25 NOTE — ED Notes (Signed)
Pt given dinner tray and sweet tea.

## 2021-09-25 NOTE — ED Notes (Signed)
Report to include Situation, Background, Assessment, and Recommendations received from Crystal RN. Patient alert and oriented, warm and dry, in no acute distress. Patient denies SI, HI, AVH and pain. Patient made aware of Q15 minute rounds and security cameras for their safety. Patient instructed to come to me with needs or concerns.  

## 2021-09-25 NOTE — ED Provider Notes (Signed)
Emergency Medicine Observation Re-evaluation Note  Ryan Zuniga is a 41 y.o. male, seen on rounds today.  Pt initially presented to the ED for complaints of Suicidal and Homicidal Currently, the patient is resting comfortably in his room and has no acute complaints.  Physical Exam  BP (!) 124/93 (BP Location: Left Arm)   Pulse 74   Temp 97.7 F (36.5 C) (Oral)   Resp 17   Ht '5\' 8"'$  (1.727 m)   Wt 68 kg   SpO2 95%   BMI 22.81 kg/m  Physical Exam General: No distress Cardiac: Well perfused Resp: Normal effort Psych: Calm and cooperative.  ED Course / MDM  EKG:   I have reviewed the labs performed to date as well as medications administered while in observation.  There have been no changes to his status in the last 24 hours.  Plan  Current plan is for placement.  Ryan Zuniga is not under involuntary commitment.     Arta Silence, MD 09/25/21 [phone]

## 2021-09-25 NOTE — ED Notes (Signed)
Pt received snack. This tech obtained vital signs. No other needs at this moment.

## 2021-09-25 NOTE — ED Notes (Signed)
Pt is restless. Pt refusing vitals, food and medication. Pt is asking for the phone. This RN will not give the phone unless he agrees to take meds and get vitals.

## 2021-09-26 NOTE — ED Notes (Signed)
Report to include Situation, Background, Assessment, and Recommendations received from Amy RN. Patient alert and oriented, warm and dry, in no acute distress. Patient denies SI, HI, AVH and pain. Patient made aware of Q15 minute rounds and security cameras for their safety. Patient instructed to come to me with needs or concerns.

## 2021-09-26 NOTE — ED Notes (Signed)
Pt gave staff 10 cups for a drink after dinner.

## 2021-09-26 NOTE — ED Notes (Signed)
Pt given dinner tray and beverage  

## 2021-09-26 NOTE — ED Notes (Signed)
Pt up to bathroom. Door locked after patient finished.

## 2021-09-26 NOTE — ED Provider Notes (Signed)
Emergency Medicine Observation Re-evaluation Note  Ryan Zuniga is a 41 y.o. male, seen on rounds today.  Pt initially presented to the ED for complaints of Suicidal and Homicidal  Currently, the patient is asleep in bed- no issues per BHU nurse  Physical Exam  Blood pressure (!) 142/95, pulse 76, temperature 98 F (36.7 C), temperature source Oral, resp. rate 20, height '5\' 8"'$  (1.727 m), weight 68 kg, SpO2 96 %.  Physical Exam General: No apparent distress Pulm: Normal WOB Neuro: resting      ED Course / MDM     I have reviewed the labs performed to date as well as medications administered while in observation.  Recent changes in the last 24 hours include none  Plan   Current plan is to continue to wait for placement Patient is not under full IVC at this time.   Vanessa [place], MD 09/26/21 [phone]

## 2021-09-26 NOTE — ED Notes (Signed)
VOL pending placement most recent note on chart 

## 2021-09-26 NOTE — ED Notes (Signed)
Patient was given snack and beverages.

## 2021-09-26 NOTE — ED Notes (Signed)
Pt gave 10 cups for beverage

## 2021-09-26 NOTE — ED Notes (Signed)
Patient brought about six cups and threw it in the trash.

## 2021-09-26 NOTE — ED Notes (Signed)
VOL/pending placement 

## 2021-09-26 NOTE — ED Notes (Signed)
Pt refused vitals at this time.

## 2021-09-26 NOTE — ED Notes (Signed)
Pt given lunch tray and beverage 

## 2021-09-27 NOTE — ED Provider Notes (Signed)
Emergency Medicine Observation Re-evaluation Note  Ryan Zuniga is a 41 y.o. male, seen on rounds today.  Pt initially presented to the ED for complaints of Suicidal and Homicidal  Pleasant, walking about behavioral unit back and forth to the bathroom without concern.  Says hello.  Voices no concerns  Physical Exam   Vitals:   09/26/21 1236 09/26/21 2012  BP: (!) 147/90 (!) 126/93  Pulse: 83 78  Resp: 18 18  Temp: 98.2 F (36.8 C) 98.1 F (36.7 C)  SpO2: 97% 95%     Physical Exam General: No apparent distress Pulm: Normal WOB Neuro: Ambulates with normal gait     ED Course / MDM     I have reviewed the labs performed to date as well as medications administered while in observation.  Recent changes in the last 24 hours include none  Plan   Current plan is to continue to wait for placement Patient is not under full IVC at this time.     Delman Kitten, MD 09/27/21 [phone]

## 2021-09-27 NOTE — ED Notes (Signed)
Reminded pt to call dad - pt thought he would call later.

## 2021-09-27 NOTE — ED Notes (Signed)
Report to include Situation, Background, Assessment, and Recommendations received from William RN. Patient alert and oriented, warm and dry, in no acute distress. Patient denies SI, HI, AVH and pain. Patient made aware of Q15 minute rounds and security cameras for their safety. Patient instructed to come to me with needs or concerns.  

## 2021-09-27 NOTE — ED Notes (Signed)
Snack and beverage given. 

## 2021-09-27 NOTE — ED Notes (Signed)
Breakfast tray offered and refused

## 2021-09-28 NOTE — ED Notes (Signed)
Report received from Wendy, RN including SBAR. Patient alert and oriented, warm and dry, in no acute distress. Patient denies SI, HI, AVH and pain. Patient made aware of Q15 minute rounds and security cameras for their safety. Patient instructed to come to this nurse with needs or concerns.  

## 2021-09-28 NOTE — ED Notes (Signed)
Pt refused repeat vital signs.

## 2021-09-28 NOTE — ED Notes (Signed)
Pt refused nighttime snack. 

## 2021-09-28 NOTE — ED Notes (Signed)
Hospital meal provided, pt tolerated w/o complaints.  Waste discarded appropriately.  

## 2021-09-28 NOTE — TOC Progression Note (Signed)
Transition of Care [facility]) - Progression Note    Patient Details  Name: Ryan Zuniga MRN: [id number] Date of Birth: 07/21/1980  [facility]) CM/SW Contact  Shelbie Hutching, RN Phone Number: 09/28/2021, 3:43 PM  Clinical Narrative:    Reached out to [place], guardian with [facility], and [place] with [place] to see if there were any updates.  Neither have heard back from [place] or [place].     Expected Discharge Plan: Group Home Barriers to Discharge: Other (must enter comment) (Group home will not take back)  Expected Discharge Plan and Services Expected Discharge Plan: Group Home   Discharge Planning Services: CM Consult   Living arrangements for the past 2 months: Group Home                 DME Arranged: N/A DME Agency: NA       HH Arranged: NA HH Agency: NA         Social Determinants of Health (SDOH) Interventions    Readmission Risk Interventions     No data to display

## 2021-09-28 NOTE — ED Provider Notes (Signed)
Emergency Medicine Observation Re-evaluation Note  Ryan Zuniga is a 41 y.o. male, seen on rounds today.  Pt initially presented to the ED for complaints of Suicidal and Homicidal Currently, the patient is sleeping comfortably.  Physical Exam  BP 131/85 (BP Location: Right Arm)   Pulse 73   Temp 97.8 F (36.6 C) (Oral)   Resp 18   Ht '5\' 8"'$  (1.727 m)   Wt 68 kg   SpO2 95%   BMI 22.81 kg/m  Physical Exam General: no distress Lungs: no increased wob Psych: calm  ED Course / MDM  EKG:   I have reviewed the labs performed to date as well as medications administered while in observation.  Recent changes in the last 24 hours include no changes.  Plan  Current plan is for SW placement.  Ayyub Krall is not under involuntary commitment.     Rada Hay, MD 09/28/21 [phone]

## 2021-09-28 NOTE — ED Notes (Signed)
Patient is pleasant, he is safe, no signs of distress, walking around in room, has tv on, no behavioral issues, will continue to monitor. Camera surveillance in operation , q 15 minute checks in progress for safety.

## 2021-09-29 NOTE — ED Notes (Signed)
VOL  PENDING  PLACEMENT 

## 2021-09-29 NOTE — ED Notes (Signed)
Hospital meal provided, pt became upset with security and flipped them off with middle finger and stated "fuck you" then returned to room.

## 2021-09-29 NOTE — ED Notes (Signed)
VOL/Still Pending Group Home Placement

## 2021-09-29 NOTE — ED Notes (Signed)
Report received from Kayzen B, RN including SBAR. On initial round after report Pt is warm/dry, resting quietly in room without any s/s of distress.  Will continue to monitor throughout shift as ordered for any changes in behaviors and for continued safety.   

## 2021-09-29 NOTE — ED Notes (Signed)

## 2021-09-29 NOTE — ED Provider Notes (Signed)
Emergency Medicine Observation Re-evaluation Note  Ryan Zuniga is a 41 y.o. male, seen on rounds today.  Pt initially presented to the ED for complaints of Suicidal and Homicidal Currently, the patient is resting, no acute concerns.  Physical Exam  BP 132/78   Pulse 75   Temp 97.9 F (36.6 C) (Oral)   Resp 18   Ht '5\' 8"'$  (1.727 m)   Wt 68 kg   SpO2 100%   BMI 22.81 kg/m  Physical Exam General: no acute distress  Psych: calm  ED Course / MDM  EKG:   I have reviewed the labs performed to date as well as medications administered while in observation.  Recent changes in the last 24 hours include none.  Plan  Current plan is for social work dispo.  Ryan Zuniga is not under involuntary commitment.     Lucrezia Starch, MD 09/29/21 1214

## 2021-09-29 NOTE — ED Notes (Signed)
Pt awake and requesting to use the bathroom.  Staff noted that pt has an excess amount of cups that he has hoarded in his room.  Staff informed pt that he would have to clean the dirty cups up.  Ryan Zuniga became extremely agitated and returned to his room.

## 2021-09-29 NOTE — ED Notes (Signed)
Attempted to obtain pt vitals.  Pt refused

## 2021-09-29 NOTE — ED Notes (Signed)
Pt received snack and drink

## 2021-09-29 NOTE — ED Notes (Signed)
Attempted to obtain pt vitals, pt refused

## 2021-09-30 NOTE — ED Notes (Signed)
Pt given dinner tray and drink at this time. 

## 2021-09-30 NOTE — TOC Progression Note (Signed)
Transition of Care [facility]) - Progression Note    Patient Details  Name: Ryan Zuniga MRN: [id number] Date of Birth: [birth year]  Transition [facility] Care [facility] CM/SW Contact  Shelbie Hutching, RN Phone Number: 09/30/2021, 2:09 PM  Clinical Narrative:    Beverly Sessions sent Justice, guardian with [facility] that they were unable to offer a bed.  Reached out to Oley Balm to see is she has heard anything from [employer].     Expected Discharge Plan: Group Home Barriers to Discharge: Other (must enter comment) (Group home will not take back)  Expected Discharge Plan and Services Expected Discharge Plan: Group Home   Discharge Planning Services: CM Consult   Living arrangements for the past 2 months: Group Home                 DME Arranged: N/A DME Agency: NA       HH Arranged: NA HH Agency: NA         Social Determinants of Health (SDOH) Interventions    Readmission Risk Interventions     No data to display

## 2021-09-30 NOTE — ED Notes (Signed)
Pt given shower supplies- in shower at this time.

## 2021-09-30 NOTE — ED Notes (Signed)
Report received from Katie, RN including SBAR. Patient alert and oriented, warm and dry, in no acute distress. Patient denies SI, HI, AVH and pain. Patient made aware of Q15 minute rounds and security cameras for their safety. Patient instructed to come to this nurse with needs or concerns.  

## 2021-09-30 NOTE — ED Notes (Signed)
Pt received snack. No other needs at the moment.

## 2021-09-30 NOTE — ED Notes (Signed)
Hospital meal provided, pt tolerated w/o complaints.  Waste discarded appropriately.  

## 2021-09-30 NOTE — ED Notes (Signed)
Report received from Kimberly S , RN including SBAR. On initial round after report Pt is warm/dry, resting quietly in room without any s/s of distress.  Will continue to monitor throughout shift as ordered for any changes in behaviors and for continued safety.   

## 2021-09-30 NOTE — ED Notes (Signed)
VOl pending placement 

## 2021-09-30 NOTE — ED Notes (Signed)
Pt given snack. 

## 2021-10-01 NOTE — ED Notes (Signed)
VOL  PENDING  PLACEMENT 

## 2021-10-01 NOTE — ED Provider Notes (Signed)
Emergency Medicine Observation Re-evaluation Note  Cayde Held is a 41 y.o. male, seen in the emergency department for psychiatric complaint.  No acute events overnight.  Physical Exam  BP 121/78 (BP Location: Left Arm)   Pulse 69   Temp 98.3 F (36.8 C) (Oral)   Resp 18   Ht '5\' 8"'$  (1.727 m)   Wt 68 kg   SpO2 98%   BMI 22.81 kg/m   ED Course / MDM   No recent lab work for review.  Plan  Current plan is for placement to an appropriate living facility once available.  Kane Kusek is not under involuntary commitment.     Harvest Dark, MD 10/01/21 [phone]

## 2021-10-01 NOTE — ED Notes (Signed)
VOL/Still Pending Placement

## 2021-10-01 NOTE — ED Notes (Signed)
Pt given snack. 

## 2021-10-01 NOTE — ED Notes (Signed)
Pt continues to refuse to give staff any cups from his room.

## 2021-10-01 NOTE — ED Notes (Signed)
Pt given snack tray and sprite.

## 2021-10-01 NOTE — ED Notes (Addendum)
Attempted to get cups from patient when he requested a drink.   Pt became agitated and returned to his room.

## 2021-10-02 NOTE — ED Notes (Signed)
Pt's mother and then father called to check on patient.   RN explained the pt was agitated, but would try to have pt call later this evening.

## 2021-10-02 NOTE — ED Notes (Signed)
Pt given snack. 

## 2021-10-02 NOTE — ED Notes (Signed)
Hospital meal provided, pt tolerated w/o complaints.  Waste discarded appropriately.  

## 2021-10-02 NOTE — ED Provider Notes (Signed)
Emergency Medicine Observation Re-evaluation Note  Ryan Zuniga is a 41 y.o. male, seen on rounds today.  Pt initially presented to the ED for complaints of Suicidal and Homicidal Currently, the patient is resting comfortably.  Physical Exam  BP 131/85 (BP Location: Left Arm)   Pulse 83   Temp 98.6 F (37 C) (Oral)   Resp 16   Ht '5\' 8"'$  (1.727 m)   Wt 68 kg   SpO2 96%   BMI 22.81 kg/m  Physical Exam General: No acute distress Cardiac: Well-perfused extremities Lungs: No respiratory distress Psych: Appropriate mood and affect  ED Course / MDM  EKG:   I have reviewed the labs performed to date as well as medications administered while in observation.  Recent changes in the last 24 hours include none.  Plan  Current plan is for placement.  Derak Schurman is not under involuntary commitment.     Naaman Plummer, MD 10/02/21 [phone]

## 2021-10-02 NOTE — ED Notes (Signed)
Patient asked writer if his hair could be cut, Patient requesting writer shave head with clippers.  Writer advised patient I could get clipper and cut hair and if he was sure he wanted hair cut.  Patient verbally state, yes please cut my hair. Security was with Probation officer during this exchange and patient giving verbal consent to cut hair.  Writer trimmed hair, and patient was very happy about haircut. Writer gave patient shower supplies and new behavioral scrubs so he was able to shower to wash off hair. Patient also cleaned cups in room. Writer disposed of 28 water cups.

## 2021-10-02 NOTE — ED Notes (Signed)
Report to include Situation, Background, Assessment, and Recommendations received from Amy RN. Patient alert and oriented, warm and dry, in no acute distress. Patient denies SI, HI, AVH and pain. Patient made aware of Q15 minute rounds and security cameras for their safety. Patient instructed to come to me with needs or concerns.

## 2021-10-02 NOTE — ED Notes (Signed)
Pt given 6 clean cups.  RN explained only 6 cups are allowed in his room.

## 2021-10-02 NOTE — ED Notes (Signed)
Snack and beverage given. 

## 2021-10-02 NOTE — TOC Progression Note (Signed)
Transition of Care [facility]) - Progression Note    Patient Details  Name: Ryan Zuniga MRN: [id number] Date of Birth: [birth year]  [facility] CM/SW Contact  Shelbie Hutching, RN Phone Number: 10/02/2021, 9:12 AM  Clinical Narrative:    Margaretmary Dys with [employer] [facility] [phone] - has arranged for a teams meeting with [facility], herself, and the guardians, and TOC for Tuesday 6/20 at 2pm to discuss how to move forward in finding placement.     Expected Discharge Plan: Group Home Barriers to Discharge: Other (must enter comment) (Group home will not take back)  Expected Discharge Plan and Services Expected Discharge Plan: Group Home   Discharge Planning Services: CM Consult   Living arrangements for the past 2 months: Group Home                 DME Arranged: N/A DME Agency: NA       HH Arranged: NA HH Agency: NA         Social Determinants of Health (SDOH) Interventions    Readmission Risk Interventions     No data to display

## 2021-10-02 NOTE — ED Notes (Signed)
Pt. Alert and oriented, warm and dry, in no distress. Pt. Denies SI, HI, and AVH. Pt was given finger nail clippers so patient was able to cut nails under the supervision of this Probation officer and security. Pt. Encouraged to let nursing staff know of any concerns or needs.  ENVIRONMENTAL ASSESSMENT Potentially harmful objects out of patient reach: Yes.   Personal belongings secured: Yes.   Patient dressed in hospital provided attire only: Yes.   Plastic bags out of patient reach: Yes.   Patient care equipment (cords, cables, call bells, lines, and drains) shortened, removed, or accounted for: Yes.   Equipment and supplies removed from bottom of stretcher: Yes.   Potentially toxic materials out of patient reach: Yes.   Sharps container removed or out of patient reach: Yes.

## 2021-10-02 NOTE — ED Notes (Signed)
Pt refusing to give staff any cups from his room.  Security entered pt's room to collect cups.  Approximately 20 cups taken from pt's room.  Pt agitated. "Payback will be a bitch!"

## 2021-10-02 NOTE — ED Notes (Signed)
VOL/pending placement 

## 2021-10-03 NOTE — ED Notes (Signed)
Pt continues to ask for soda's throughout the day.  Staff has explained several times that between meals water is available.  Pt stated "Fuck you bitch" and went to room.  Cont to monitor

## 2021-10-03 NOTE — ED Provider Notes (Signed)
Emergency Medicine Observation Re-evaluation Note  Ryan Zuniga is a 41 y.o. male, seen on rounds today.  Pt initially presented to the ED for complaints of Suicidal and Homicidal Currently, the patient is resting comfortably.  Physical Exam  BP (!) 132/94 (BP Location: Right Arm)   Pulse 63   Temp 97.8 F (36.6 C) (Oral)   Resp 18   Ht '5\' 8"'$  (1.727 m)   Wt 68 kg   SpO2 99%   BMI 22.81 kg/m  Physical Exam Constitutional:      Appearance: He is not ill-appearing or toxic-appearing.  Pulmonary:     Effort: Pulmonary effort is normal.  Musculoskeletal:        General: No deformity.  Psychiatric:     Comments: No emotional distress      ED Course / MDM  EKG:   I have reviewed the labs performed to date as well as medications administered while in observation.  Recent changes in the last 24 hours include none.  Plan  Current plan is for placement.  Ryan Zuniga is not under involuntary commitment.     [name], MD 10/03/21 [phone]

## 2021-10-03 NOTE — ED Notes (Signed)
Snack and beverage given. 

## 2021-10-03 NOTE — ED Notes (Signed)
Pt refusing medications

## 2021-10-03 NOTE — ED Notes (Signed)
VOL/pending placement 

## 2021-10-03 NOTE — ED Notes (Signed)
Report received from Hewan M, RN including SBAR. On initial round after report Pt is warm/dry, resting quietly in room without any s/s of distress.  Will continue to monitor throughout shift as ordered for any changes in behaviors and for continued safety.   

## 2021-10-03 NOTE — ED Notes (Signed)
Pt refused breakfast and refused medication(s).  He then became verbally aggressive with staff. Pt then requested to use the telephone.  Staff informed pt that he was unable to use the telephone until he takes his scheduled medications.  Will cont to monitor as ordered

## 2021-10-03 NOTE — ED Notes (Signed)
VOl pending placement 

## 2021-10-03 NOTE — ED Notes (Signed)
Pt refusing meal

## 2021-10-03 NOTE — ED Notes (Signed)
Report to include Situation, Background, Assessment, and Recommendations received from [name]. Patient alert and oriented, warm and dry, in no acute distress. Patient denies SI, HI, AVH and pain. Patient made aware of Q15 minute rounds and security cameras for their safety. Patient instructed to come to me with needs or concerns.  

## 2021-10-04 NOTE — ED Provider Notes (Signed)
Emergency Medicine Observation Re-evaluation Note  Seyon Strader is a 41 y.o. male, seen on rounds today.  Pt initially presented to the ED for complaints of Suicidal and Homicidal Currently, the patient is resting comfortably in his room and has no acute concerns other than asking when he will be able to leave.  Physical Exam  BP (!) 130/91 (BP Location: Left Arm)   Pulse 76   Temp 98 F (36.7 C) (Oral)   Resp 20   Ht '5\' 8"'$  (1.727 m)   Wt 68 kg   SpO2 99%   BMI 22.81 kg/m  Physical Exam General: No distress Cardiac: Well perfused Lungs: Normal respiratory effort Psych: Calm and cooperative  ED Course / MDM   I have reviewed the labs performed to date as well as medications administered while in observation.  There have been no significant changes to his status in last 24 hours  Plan  Current plan is for placement.  Charlene Cowdrey is not under involuntary commitment.     Arta Silence, MD 10/04/21 1550

## 2021-10-04 NOTE — ED Notes (Signed)
Hospital meal provided, pt tolerated w/o complaints.  Waste discarded appropriately.  

## 2021-10-04 NOTE — ED Notes (Signed)
Report to include Situation, Background, Assessment, and Recommendations received from [name]. Patient alert and oriented, warm and dry, in no acute distress. Patient denies SI, HI, AVH and pain. Patient made aware of Q15 minute rounds and security cameras for their safety. Patient instructed to come to me with needs or concerns.  

## 2021-10-04 NOTE — ED Notes (Signed)
Snack and beverage given. 

## 2021-10-04 NOTE — ED Notes (Signed)
VOL/pending placement 

## 2021-10-05 NOTE — ED Notes (Signed)
Staff removed 8 cups from pt's room.

## 2021-10-05 NOTE — ED Notes (Signed)
VOLUNTARY continues to await TOC dispo 

## 2021-10-05 NOTE — TOC Progression Note (Signed)
Transition of Care [facility]) - Progression Note    Patient Details  Name: Ryan Zuniga MRN: [id number] Date of Birth: 1980-11-17  Transition of Care [facility]) CM/SW Contact  Shelbie Hutching, RN Phone Number: 10/05/2021, 2:47 PM  Clinical Narrative:    Received confirmation with Jennelle Human from [employer] [facility] team meeting scheduled for tomorrow at 3pm to discuss placement.   Expected Discharge Plan: Group Home Barriers to Discharge: Other (must enter comment) (Group home will not take back)  Expected Discharge Plan and Services Expected Discharge Plan: Group Home   Discharge Planning Services: CM Consult   Living arrangements for the past 2 months: Group Home                 DME Arranged: N/A DME Agency: NA       HH Arranged: NA HH Agency: NA         Social Determinants of Health (SDOH) Interventions    Readmission Risk Interventions     No data to display

## 2021-10-05 NOTE — ED Notes (Signed)
RN and EDT removed roughly 12 trays  / 3 cups from pt's room.

## 2021-10-05 NOTE — ED Provider Notes (Signed)
Emergency Medicine Observation Re-evaluation Note  Ferdie Bakken is a 41 y.o. male, seen on rounds today.  Pt initially presented to the ED for complaints of Suicidal and Homicidal  Currently, the patient is resting in bed   Physical Exam  Blood pressure (!) 137/94, pulse 77, temperature 98 F (36.7 C), temperature source Oral, resp. rate 18, height '5\' 8"'$  (1.727 m), weight 68 kg, SpO2 97 %.  Physical Exam Physical Exam General: No apparent distress Pulm: Normal WOB Neuro: resting in bed      ED Course / MDM     I have reviewed the labs performed to date as well as medications administered while in observation.  Recent changes in the last 24 hours include none  Plan   Current plan is to continue to wait for SW placement  Patient is not under full IVC at this time.   Vanessa [place], MD 10/05/21 0800

## 2021-10-05 NOTE — ED Notes (Signed)
Sandwich and soft drink given.  

## 2021-10-05 NOTE — ED Notes (Signed)
Patient resting quietly in room. No noted distress or abnormal behaviors noted. Will continue 15 minute checks and observation by security camera for safety. 

## 2021-10-05 NOTE — ED Notes (Signed)
VOL/Pending Placement 

## 2021-10-06 NOTE — ED Notes (Signed)
ENVIRONMENTAL ASSESSMENT Potentially harmful objects out of patient reach: Yes.   Personal belongings secured: Yes.   Patient dressed in hospital provided attire only: Yes.   Plastic bags out of patient reach: Yes.   Patient care equipment (cords, cables, call bells, lines, and drains) shortened, removed, or accounted for: Yes.   Equipment and supplies removed from bottom of stretcher: Yes.   Potentially toxic materials out of patient reach: Yes.   Sharps container removed or out of patient reach: Yes.      Pt. Alert and oriented, warm and dry, in no distress. Pt. Denies SI, HI, and AVH. Pt. Encouraged to let nursing staff know of any concerns or needs.   

## 2021-10-06 NOTE — ED Notes (Signed)
VOL  PENDING  PLACEMENT 

## 2021-10-06 NOTE — ED Notes (Signed)
Hospital meal provided, pt tolerated w/o complaints.  Waste discarded appropriately.  

## 2021-10-06 NOTE — ED Notes (Signed)
Gave snack with drink.

## 2021-10-06 NOTE — ED Notes (Signed)
Patient became upset with Probation officer when Probation officer advised patient he needed to throw away some of the cups in room. Patient started cursing at this Probation officer and stating get out.

## 2021-10-06 NOTE — ED Provider Notes (Signed)
Emergency Medicine Observation Re-evaluation Note  Ryan Zuniga is a 41 y.o. male, seen on rounds today.   Physical Exam  BP (!) 135/96 (BP Location: Right Arm)   Pulse 82   Temp 98.4 F (36.9 C) (Oral)   Resp 18   Ht '5\' 8"'$  (1.727 m)   Wt 68 kg   SpO2 95%   BMI 22.81 kg/m  Physical Exam General: Patient resting comfortably in bed Lungs: Patient not in respiratory distress Psych: Patient not combative  ED Course / MDM  EKG:     Plan  Current plan is for social work evaluation and placement.  Ryan Zuniga is not under involuntary commitment.     Nena Polio, MD 10/06/21 [phone]

## 2021-10-06 NOTE — ED Notes (Signed)

## 2021-10-06 NOTE — TOC Progression Note (Signed)
Transition of Care [name]) - Progression Note    Patient Details  Name: Ryan Zuniga MRN: [id number] Date of Birth: [birth year]  Transition of Care [facility]) CM/SW Contact  Shelbie Hutching, RN Phone Number: 10/06/2021, 3:31 PM  Clinical Narrative:    Care team meeting with [facility] case manager, Dallie Dad with [employer] [facility] and myself to discuss plan for placement.  [facility] declined to offer any beds.  Placement options are limited but Oley Balm will widen her search.  TOC will cont to follow for placement.     Expected Discharge Plan: Group Home Barriers to Discharge: Other (must enter comment) (Group home will not take back)  Expected Discharge Plan and Services Expected Discharge Plan: Group Home   Discharge Planning Services: CM Consult   Living arrangements for the past 2 months: Group Home                 DME Arranged: N/A DME Agency: NA       HH Arranged: NA HH Agency: NA         Social Determinants of Health (SDOH) Interventions    Readmission Risk Interventions     No data to display

## 2021-10-06 NOTE — ED Notes (Signed)
Report received from Kimberly S , RN including SBAR. On initial round after report Pt is warm/dry, resting quietly in room without any s/s of distress.  Will continue to monitor throughout shift as ordered for any changes in behaviors and for continued safety.   

## 2021-10-07 NOTE — ED Notes (Signed)
VOl pending placement 

## 2021-10-07 NOTE — ED Notes (Signed)
Pt refused breakfast and VS. RN aware.

## 2021-10-07 NOTE — ED Notes (Signed)
Report received from Kimberly S , RN including SBAR. On initial round after report Pt is warm/dry, resting quietly in room without any s/s of distress.  Will continue to monitor throughout shift as ordered for any changes in behaviors and for continued safety.   

## 2021-10-07 NOTE — ED Notes (Signed)
VOL/Pending Placement 

## 2021-10-07 NOTE — ED Notes (Signed)
Pt received snack and drink

## 2021-10-07 NOTE — ED Provider Notes (Signed)
Emergency Medicine Observation Re-evaluation Note  Ryan Zuniga is a 41 y.o. male, seen on rounds today.   Physical Exam  BP (!) 135/96 (BP Location: Right Arm)   Pulse 82   Temp 98.4 F (36.9 C) (Oral)   Resp 18   Ht '5\' 8"'$  (1.727 m)   Wt 68 kg   SpO2 95%   BMI 22.81 kg/m  Physical Exam General: Patient standing in room looks well Lungs: Patient in no respiratory distress Psych: Patient not currently combative  ED Course / MDM  EKG:    Plan  Current plan is for social work to place the patient.  Ryan Zuniga is not under involuntary commitment.     Nena Polio, MD 10/07/21 [phone]

## 2021-10-08 NOTE — ED Notes (Signed)
Patient ate 100% of lunch and beverage.

## 2021-10-08 NOTE — ED Notes (Signed)
Snack and beverage given. 

## 2021-10-08 NOTE — ED Notes (Signed)
PT REMAINS VOL. W/ PLACEMENT PENDING. MOST RECENT NOTE ON CHART

## 2021-10-08 NOTE — ED Notes (Signed)
Report received from Wendy, RN including SBAR. Patient alert and oriented, warm and dry, in no acute distress. Patient denies SI, HI, AVH and pain. Patient made aware of Q15 minute rounds and security cameras for their safety. Patient instructed to come to this nurse with needs or concerns.  

## 2021-10-08 NOTE — ED Notes (Signed)
Patient denies Si/hi or avh, Patient is pleasant, no complaints or behavior issues today, will continue to monitor.

## 2021-10-08 NOTE — ED Notes (Signed)
Patient refused supper tonight, did drink beverage. Patient talked to His Dad on the phone, He became agitated because He wants to leave here and be able to go outside, but He calmed down and was easily redirected, will continue to monitor.

## 2021-10-08 NOTE — ED Notes (Signed)
Patient refuses room being cleaned by EVS.

## 2021-10-08 NOTE — ED Provider Notes (Signed)
Emergency Medicine Observation Re-evaluation Note  Ryan Zuniga is a 41 y.o. male, seen on rounds today.   Physical Exam  BP 119/86   Pulse 74   Temp 97.9 F (36.6 C) (Oral)   Resp 17   Ht '5\' 8"'$  (1.727 m)   Wt 68 kg   SpO2 96%   BMI 22.81 kg/m  Physical Exam General: Patient resting comfortably in bed Lungs: Patient not in respiratory distress Psych: Patient not combative  ED Course / MDM  EKG:     Plan  Current plan is for social work placement.  Ryan Zuniga is not under involuntary commitment.     Nena Polio, MD 10/08/21 [phone]

## 2021-10-08 NOTE — ED Notes (Signed)
Patient ate 100% of breakfast and beverage.  

## 2021-10-09 NOTE — ED Notes (Signed)
Breakfast tray given. °

## 2021-10-09 NOTE — TOC Progression Note (Signed)
Transition of Care [facility]) - Progression Note    Patient Details  Name: Ryan Zuniga MRN: [id number] Date of Birth: 10-Jul-1980  Transition of Care [facility]) CM/SW Contact  Allayne Butcher, RN Phone Number: 10/09/2021, 9:19 AM  Clinical Narrative:    No updates from [facility] or Guardian or [employer] [facility].    Expected Discharge Plan: Group Home Barriers to Discharge: Other (must enter comment) (Group home will not take back)  Expected Discharge Plan and Services Expected Discharge Plan: Group Home   Discharge Planning Services: CM Consult   Living arrangements for the past 2 months: Group Home                 DME Arranged: N/A DME Agency: NA       HH Arranged: NA HH Agency: NA         Social Determinants of Health (SDOH) Interventions    Readmission Risk Interventions     No data to display

## 2021-10-09 NOTE — ED Notes (Signed)
Report to include Situation, Background, Assessment, and Recommendations received from Andrea RN. Patient alert and oriented, warm and dry, in no acute distress. Patient denies SI, HI, AVH and pain. Patient made aware of Q15 minute rounds and security cameras for their safety. Patient instructed to come to me with needs or concerns.  

## 2021-10-10 NOTE — ED Notes (Addendum)
 Pt gave staff all the cups in his room 9 22 cups) for decaf coffee.

## 2021-10-10 NOTE — ED Notes (Signed)
Lunch tray given. 

## 2021-10-10 NOTE — ED Notes (Signed)
 Pt brought staff all the trays in his room.

## 2021-10-10 NOTE — ED Notes (Signed)
 Pt asking for coffee.  RN explained he would need to give staff the trash out of his room.  Pt stated, "Nevermind."

## 2021-10-11 NOTE — ED Notes (Signed)
Breakfast and drink given °

## 2021-10-11 NOTE — ED Notes (Addendum)
 Pt allowed to use the phone.  Staff discovered he had called 911 and disconnected phone.  Patient came to dayroom and flung phone at Engineer, materials. Pt returned to his room, threw cups of water and flipped off camera.   Pt given PRN PO Zyprexa with security present.   Pt told he no longer had phone privileges.

## 2021-10-11 NOTE — ED Notes (Signed)
 Pt gave staff 5 cups after asking for a drink.  When drink was provided by staff, pt refused. "I'm good."

## 2021-10-12 NOTE — ED Notes (Signed)
Provided lunch. 

## 2021-10-12 NOTE — ED Notes (Signed)
Refused breakfast; pt took 8 oz of PO fluids. Encouraged to clean room.

## 2021-10-14 NOTE — TOC Progression Note (Signed)
Transition of Care [facility]) - Progression Note    Patient Details  Name: Ryan Zuniga MRN: [id number] Date of Birth: 30-Jun-1980  [facility] Care [facility]) CM/SW Contact  Shelbie Hutching, RN Phone Number: 10/14/2021, 4:31 PM  Clinical Narrative:    Penni Homans will get patient and IDD coordinator assigned and they would be able to refer to [facility].  Guardian is on board with this plan.     Expected Discharge Plan: Group Home Barriers to Discharge: Other (must enter comment) (Group home will not take back)  Expected Discharge Plan and Services Expected Discharge Plan: Group Home   Discharge Planning Services: CM Consult   Living arrangements for the past 2 months: Group Home                 DME Arranged: N/A DME Agency: NA       HH Arranged: NA HH Agency: NA         Social Determinants of Health (SDOH) Interventions    Readmission Risk Interventions     No data to display

## 2021-10-14 NOTE — ED Provider Notes (Signed)
Emergency Medicine Observation Re-evaluation Note  Ryan Zuniga is a 41 y.o. male, seen on rounds today.  Pt initially presented to the ED for complaints of Suicidal and Homicidal   Currently ambulating back from bathroom to his room.  Reports no concerns or issues.  Physical Exam  BP (!) 127/105   Pulse 80   Temp 97.7 F (36.5 C) (Oral)   Resp 16   Ht '5\' 8"'$  (1.727 m)   Wt 68 kg   SpO2 98%   BMI 22.81 kg/m  Physical Exam Constitutional:      Appearance: He is not ill-appearing or toxic-appearing.  Pulmonary:     Effort: Pulmonary effort is normal.  Musculoskeletal:        General: No deformity.  Psychiatric:     Comments: No emotional distress    He walks with normal gait.  Voices no concerns  ED Course / MDM  EKG:   I have reviewed the labs performed to date as well as medications administered while in observation.  Recent changes in the last 24 hours include none.  Plan  Current plan is for placement.  Ryan Zuniga is not under involuntary commitment.    Ryan Kitten, MD 10/14/21 [phone]

## 2021-10-14 NOTE — ED Notes (Signed)
Pt verbally aggressive towards staff yelling cursing and posturing as to hit staff and security.  Pt demanding to use the phone to call ACT team.  Mr Todorov is currently not able to use the phone per behavior plan d/t past behaviors with throwing the phone at staff and calling phone numbers that he is not authorized to call.  Staff notified TOC RN who will notify pt's ACT team that he is requesting to meet with them.  Staff trying to set up a face to face encounter w/ ACT team

## 2021-10-14 NOTE — ED Notes (Signed)
Pt has now come out of room, apologized and requests snack. Informed that no snack if vitals are not obtained. Pt consents. Pt states that being here is making him feel like "I am going crazy." States he has been here too long and is just ready to leave. Understood and educated on actions taken to help him leave.

## 2021-10-14 NOTE — ED Notes (Signed)
VOL/Pending Placement 

## 2021-10-14 NOTE — ED Notes (Signed)
Pt has demanded to be left alone when attempting to collect vitals and check what pt would like for a night time snack. Pt states "I don't want nothing." Pt to be left alone. Will check on night meds when they are due.

## 2021-10-14 NOTE — ED Notes (Signed)
Pt given dinner tray and drink at this time. 

## 2021-10-14 NOTE — ED Notes (Signed)
Report received from [place], Conservation officer, nature. Patient alert and oriented, warm and dry, in no acute distress. Patient denies SI, HI, AVH and pain. Patient made aware of Q15 minute rounds and security cameras for their safety. Patient instructed to come to this nurse with needs or concerns.

## 2021-10-14 NOTE — ED Notes (Signed)
VOl placement most recent note on chart

## 2021-10-14 NOTE — ED Notes (Signed)
Pt verbally aggressive and refusing vitals at this time. Will continue to monitor.

## 2021-10-14 NOTE — ED Notes (Signed)
Report received from Kimberly S , RN including SBAR. On initial round after report Pt is warm/dry, resting quietly in room without any s/s of distress.  Will continue to monitor throughout shift as ordered for any changes in behaviors and for continued safety.   

## 2021-10-14 NOTE — ED Notes (Signed)
Snack and beverage given. 

## 2021-10-15 NOTE — ED Provider Notes (Signed)
Emergency Medicine Observation Re-evaluation Note  Ryan Zuniga is a 41 y.o. male, seen on rounds today.  Physical Exam  BP (!) 134/100   Pulse 82   Temp 97.7 F (36.5 C) (Oral)   Resp 18   Ht '5\' 8"'$  (1.727 m)   Wt 68 kg   SpO2 99%   BMI 22.81 kg/m  Physical Exam General: Patient resting comfortably in bed Lungs: Patient not respiratory distress Psych: Patient not combative  ED Course / MDM  EKG:    Plan  Current plan is for social work placement.  Ryan Zuniga is not under involuntary commitment.     Nena Polio, MD 10/15/21 0800

## 2021-10-15 NOTE — ED Notes (Signed)
Messaged pharm about missing med. Will give once received/available.

## 2021-10-15 NOTE — ED Notes (Signed)
VOL  PENDING  PLACEMENT 

## 2021-10-15 NOTE — ED Notes (Signed)
Assumed care from [place], RN at this time. Pt is sleeping in bed

## 2021-10-16 NOTE — ED Notes (Signed)
During medication pass pt verified medications opened in front of pt.  Ryan Zuniga then stated "I'm straight ok" and went into room.  Pt did not take medications.  Med.s are being held in nursing station to see if pt will take at a later time.  Pt has a hx with refusing medications when initial med is attempted then returning to staff <1hr later to request his medications. Staff will attempt again once pt has had time to use self coping skills

## 2021-10-16 NOTE — TOC Progression Note (Signed)
Transition of Care [facility]) - Progression Note    Patient Details  Name: Ryan Zuniga MRN: [id number] Date of Birth: [birth year]  [facility] CM/SW Contact  Shelbie Hutching, RN Phone Number: 10/16/2021, 5:03 PM  Clinical Narrative:    Jeralene Huff out to Oley Balm with [facility] about updates for IDD coordinator.  She reports that they need IQ results - she will see if they can get a provider to come in and do and IQ test.     Expected Discharge Plan: Group Home Barriers to Discharge: Other (must enter comment) (Group home will not take back)  Expected Discharge Plan and Services Expected Discharge Plan: Group Home   Discharge Planning Services: CM Consult   Living arrangements for the past 2 months: Group Home                 DME Arranged: N/A DME Agency: NA       HH Arranged: NA HH Agency: NA         Social Determinants of Health (SDOH) Interventions    Readmission Risk Interventions     No data to display

## 2021-10-16 NOTE — ED Notes (Signed)
Pt refuses vitals  

## 2021-10-16 NOTE — ED Notes (Signed)
Vol /pending placement 

## 2021-10-16 NOTE — ED Notes (Signed)
Patient refusing vital signs at this time.

## 2021-10-16 NOTE — ED Notes (Signed)
Medication(s) and breakfast offered again.  Pt continues to decline

## 2021-10-16 NOTE — ED Notes (Signed)
Report received from [place], Conservation officer, nature. Patient alert and oriented, warm and dry, in no acute distress. Patient denies SI, HI, AVH and pain. Patient made aware of Q15 minute rounds and security cameras for their safety. Patient instructed to come to this nurse with needs or concerns.

## 2021-10-16 NOTE — ED Notes (Signed)
Breakfast was presented to pt, he refused to open door and stated "I'm straight, ok" staff will keep breakfast for an appropriate amount of time and attempt to give to pt again before disposing of his meal.

## 2021-10-16 NOTE — ED Notes (Signed)
VOL/Pending Placement 

## 2021-10-16 NOTE — ED Notes (Signed)
Report received from Venida Jarvis, Conservation officer, nature. On initial round after report Pt is warm/dry, resting quietly in room without any s/s of distress.  Will continue to monitor throughout shift as ordered for any changes in behaviors and for continued safety.

## 2021-10-16 NOTE — ED Provider Notes (Signed)
Emergency Medicine Observation Re-evaluation Note  Ryan Zuniga is a 41 y.o. male, seen on rounds today.  Pt initially presented to the ED for complaints of Suicidal and Homicidal Currently, the patient is resting comfortably.  Physical Exam  BP (!) 129/95 (BP Location: Left Arm)   Pulse 80   Temp 98.1 F (36.7 C) (Oral)   Resp 17   Ht '5\' 8"'$  (1.727 m)   Wt 68 kg   SpO2 99%   BMI 22.81 kg/m  Physical Exam General: No acute distress Cardiac: Well-perfused extremities Lungs: No respiratory distress Psych: Appropriate mood and affect  ED Course / MDM  EKG:   I have reviewed the labs performed to date as well as medications administered while in observation.  Recent changes in the last 24 hours include none.  Plan  Current plan is for placement.  [name] is not under involuntary commitment.     Ryan Plummer, MD 10/16/21 [phone]

## 2021-10-17 DIAGNOSIS — F17210 Nicotine dependence, cigarettes, uncomplicated: Secondary | ICD-10-CM | POA: Diagnosis not present

## 2021-10-17 DIAGNOSIS — F250 Schizoaffective disorder, bipolar type: Secondary | ICD-10-CM | POA: Diagnosis not present

## 2021-10-17 DIAGNOSIS — Z046 Encounter for general psychiatric examination, requested by authority: Secondary | ICD-10-CM | POA: Diagnosis present

## 2021-10-17 DIAGNOSIS — F1010 Alcohol abuse, uncomplicated: Secondary | ICD-10-CM | POA: Diagnosis not present

## 2021-10-17 DIAGNOSIS — F4325 Adjustment disorder with mixed disturbance of emotions and conduct: Secondary | ICD-10-CM | POA: Diagnosis not present

## 2021-10-17 DIAGNOSIS — Q860 Fetal alcohol syndrome (dysmorphic): Secondary | ICD-10-CM | POA: Diagnosis not present

## 2021-10-17 DIAGNOSIS — Z1152 Encounter for screening for COVID-19: Secondary | ICD-10-CM | POA: Diagnosis not present

## 2021-10-17 DIAGNOSIS — F79 Unspecified intellectual disabilities: Secondary | ICD-10-CM | POA: Diagnosis not present

## 2021-10-17 DIAGNOSIS — F603 Borderline personality disorder: Secondary | ICD-10-CM | POA: Diagnosis not present

## 2021-10-17 NOTE — ED Notes (Signed)
Pt given lunch tray and drink; pt did not eat anything from tray at all.

## 2021-10-17 NOTE — ED Notes (Signed)
This RN asked pt for cup and pt stated that he is still drinking.  This RN allowed pt to keep cup at this time as only one cup is in room.  Pt calm and cooperative.

## 2021-10-17 NOTE — ED Notes (Signed)
Report received from Venida Jarvis, Conservation officer, nature. On initial round after report Pt is warm/dry, resting quietly in room without any s/s of distress.  Will continue to monitor throughout shift as ordered for any changes in behaviors and for continued safety.

## 2021-10-17 NOTE — ED Provider Notes (Signed)
Emergency Medicine Observation Re-evaluation Note  Rollins Wrightson is a 41 y.o. male, seen on rounds today.  Pt initially presented to the ED for complaints of Suicidal and Homicidal Currently, the patient is resting comfortably.  Physical Exam  BP (!) 138/91 (BP Location: Left Arm)   Pulse 65   Temp 98.6 F (37 C)   Resp 18   Ht '5\' 8"'$  (1.727 m)   Wt 68 kg   SpO2 99%   BMI 22.81 kg/m  Physical Exam General: No acute distress Cardiac: Well-perfused extremities Lungs: No respiratory distress Psych: Appropriate mood and affect  ED Course / MDM  EKG:   I have reviewed the labs performed to date as well as medications administered while in observation.  Recent changes in the last 24 hours include none.  Plan  Current plan is for placement.  Nocholas Damaso is not under involuntary commitment.     Naaman Plummer, MD 10/17/21 1145

## 2021-10-17 NOTE — ED Notes (Signed)
Pt requesting a drink. This tech told pt that a drink would be given once he brought a cup from his rm to put in the trash d/t having multiple cups in rm. Pt went into rm and then came out starting to yell and curse at this writer saying, "Fuck you bitch. I'm not giving you my cups." Katie, RN notified and will start behavior coaching plan.

## 2021-10-17 NOTE — ED Notes (Signed)
NT notified staff of adverse behaviors.  He is being verbally aggressive towards staff, yelling, and cursing. Implemented behavior coaching plan protocol per admin.  All cups removed from pt room. Verbal redirection attempted.

## 2021-10-17 NOTE — ED Notes (Signed)
Report received from [facility] at [phone].  This RN alerted to pt agitation at times and behavioral plan in place.

## 2021-10-17 NOTE — ED Notes (Signed)
VOL/pending placement 

## 2021-10-17 NOTE — ED Notes (Addendum)
Pt refusing vital signs and medication at this time, stating "I'm good.  I'm good. I'm good godammit" and showing increased agitation.

## 2021-10-17 NOTE — ED Notes (Signed)
During nursing assessment was A/Ox 3 .  Marland KitchenMr [place]  stated that he is not currently have thoughts or feelings of SI/HI.  Bobak reported he is not having auditory or visual hallucinations.  Pt affect is bright , eye contact is intermittent , speech is of regular rate and volume with appropriate verbiage noted.  Staff addressed any feelings or concerns that have been brought up.  Medications were administered as ordered. Pt made comment on his behavioral coaching plan as today he is able to resume normal activities as pt has had no further negative behaviors.  Pt was excitedly speaking to staff and stated "I've done real good and didn't get mad for 3 days"  Pt was smiling and stated that he would like to call his father and relay this to him.  Continue to monitor patient as ordered for any changes in behaviors and for continued safety.

## 2021-10-17 NOTE — ED Notes (Signed)
This RN asked pt if he would like an 8p snack and he said "No, I'm good."  When this Rn asked if he was sure since it would be his last chance for the night, pt displayed agitation and stated "no, godammit" then started mumbling.

## 2021-10-17 NOTE — ED Notes (Signed)
Patient refused vitals signs

## 2021-10-18 DIAGNOSIS — F250 Schizoaffective disorder, bipolar type: Secondary | ICD-10-CM | POA: Diagnosis not present

## 2021-10-18 NOTE — ED Notes (Signed)
Pt given dinner tray and drink, refused tray at this time.

## 2021-10-18 NOTE — ED Notes (Signed)
MD over to assess rash, pt refuses to allow MD to examine.

## 2021-10-18 NOTE — ED Notes (Signed)
Pt given lunch tray and drink, pt refused tray. Pt given potato chips.

## 2021-10-18 NOTE — ED Notes (Signed)
Pt given shower supplies, currently in shower. 

## 2021-10-18 NOTE — ED Notes (Signed)
Pt states he will now take his medications. Pt reminding of consequences for outbursts.

## 2021-10-18 NOTE — ED Notes (Signed)
Pt with cofluent red raised rash noted to bilateral forearms, pt denies itching, shob. No fever noted .secure chat message sent to dr. Kerman Passey to inform.

## 2021-10-18 NOTE — ED Notes (Signed)
Patient cooperative. Vital signs obtained.

## 2021-10-18 NOTE — ED Notes (Signed)
Report received from Sharlette Dense, Conservation officer, nature. On initial round after report Pt is warm/dry, resting quietly in room without any s/s of distress.  Will continue to monitor throughout shift as ordered for any changes in behaviors and for continued safety.

## 2021-10-18 NOTE — ED Notes (Signed)
Pt refusing medication, pt yelling cussing, "fuck you". Pt states "fuck the coffee".

## 2021-10-18 NOTE — ED Notes (Addendum)
Pt out of shower at this time 

## 2021-10-18 NOTE — ED Notes (Signed)
Patient given breakfast tray with milk but refusing vital signs at this time. Patient requesting a sprite to drink. This tech attempted to obtain vitals signs in exchange for a sprite but patient refusing.

## 2021-10-18 NOTE — ED Provider Notes (Signed)
Emergency Medicine Observation Re-evaluation Note  Ryan Zuniga is a 41 y.o. male, seen on rounds today.  Physical Exam  BP (!) 138/99 (BP Location: Left Arm)   Pulse 96   Temp 98.2 F (36.8 C) (Oral)   Resp 18   Ht '5\' 8"'$  (1.727 m)   Wt 68 kg   SpO2 99%   BMI 22.81 kg/m  Physical Exam General: Patient resting comfortably in bed Lungs: Patient not in respiratory distress Psych: Patient not combative  ED Course / MDM  EKG:     Plan  Current plan is for social work placement.  Ryan Zuniga is not under involuntary commitment.     Nena Polio, MD 10/18/21 1028

## 2021-10-18 NOTE — ED Provider Notes (Signed)
-----------------------------------------   11:17 PM on 10/18/2021 ----------------------------------------- Nurse informing today the patient was complaining of a rash to his bilateral forearms.  I attempted to evaluate the patient however he became extremely agitated upset is refusing any evaluation.  Attempted twice to evaluate without success.   Harvest Dark, MD 10/18/21 2318

## 2021-10-19 DIAGNOSIS — F250 Schizoaffective disorder, bipolar type: Secondary | ICD-10-CM | POA: Diagnosis not present

## 2021-10-19 NOTE — ED Notes (Signed)
Patient consumed 100% of breakfast and beverage, no signs of distress, will continue to monitor, surveillance camera are in progress for safety.

## 2021-10-19 NOTE — ED Notes (Signed)
Report to wendy, rn

## 2021-10-19 NOTE — ED Notes (Signed)
Pt asking for a sprite, Pt informed that he needs to bring a cup to throw away first. Pt accepting and brought cup to nurse's station. Pt given a sprite.

## 2021-10-19 NOTE — ED Notes (Signed)
VOL pending placement most recent note on chart

## 2021-10-19 NOTE — ED Notes (Signed)
Sandwich and soft drink given.  

## 2021-10-20 DIAGNOSIS — F250 Schizoaffective disorder, bipolar type: Secondary | ICD-10-CM | POA: Diagnosis not present

## 2021-10-20 NOTE — ED Notes (Signed)
Pt refused breakfast 

## 2021-10-20 NOTE — ED Notes (Signed)
10+ cups removed from pt's room.

## 2021-10-20 NOTE — ED Notes (Signed)
Pt refusing VS

## 2021-10-20 NOTE — ED Notes (Signed)
Pt now willing to have VS taken and take medications.

## 2021-10-20 NOTE — ED Notes (Signed)
Hospital meal provided, pt tolerated w/o complaints.  Waste discarded appropriately.  

## 2021-10-20 NOTE — ED Notes (Signed)
Pt unwilling to give a cup to get a drink (soda).   Pt returned to his room.

## 2021-10-20 NOTE — ED Notes (Signed)
Pt flipping off staff on camera in his room.

## 2021-10-20 NOTE — ED Notes (Signed)
Vol pending placement most recent note on chart

## 2021-10-20 NOTE — ED Notes (Signed)
Snack and drink given 

## 2021-10-20 NOTE — ED Notes (Signed)
Pt. Alert and oriented, warm and dry, in no distress. Pt. Denies SI, HI, and AVH. Cups removed from room by patient at the request of Probation officer. Patient was cooperative with request. Pt. Encouraged to let nursing staff know of any concerns or needs.   ENVIRONMENTAL ASSESSMENT Potentially harmful objects out of patient reach: Yes.   Personal belongings secured: Yes.   Patient dressed in hospital provided attire only: Yes.   Plastic bags out of patient reach: Yes.   Patient care equipment (cords, cables, call bells, lines, and drains) shortened, removed, or accounted for: Yes.   Equipment and supplies removed from bottom of stretcher: Yes.   Potentially toxic materials out of patient reach: Yes.   Sharps container removed or out of patient reach: Yes.

## 2021-10-20 NOTE — ED Notes (Signed)
Pt still agitated he must give up a cup to get a drink.

## 2021-10-20 NOTE — ED Notes (Signed)
VOL/Pending Placement 

## 2021-10-20 NOTE — ED Notes (Signed)
Lunch tray and drink given.

## 2021-10-21 DIAGNOSIS — F250 Schizoaffective disorder, bipolar type: Secondary | ICD-10-CM | POA: Diagnosis not present

## 2021-10-21 MED ORDER — LORAZEPAM 2 MG/ML IJ SOLN: 2.0000 mg | Freq: Once | INTRAMUSCULAR | Status: DC

## 2021-10-21 MED ORDER — TERBINAFINE HCL 1 % EX CREA
TOPICAL_CREAM | Freq: Two times a day (BID) | CUTANEOUS | Status: DC
  Administered 2021-10-24 – 2021-12-04 (×7): 1 via TOPICAL
  Filled 2021-10-21 (×6): qty 12

## 2021-10-21 MED ORDER — HALOPERIDOL LACTATE 5 MG/ML IJ SOLN: 10.0000 mg | Freq: Once | INTRAMUSCULAR | Status: DC

## 2021-10-21 NOTE — ED Notes (Signed)
VOL  PENDING  PLACEMENT 

## 2021-10-21 NOTE — ED Notes (Signed)
Staff x1 w/ security x1 assisted pt with shower.  After shower staff w/ security applied topical rx to affected areas on pt's body, arms, and back.  Staff to continue assisting pt in showering e/o day and with the application of topical as prescribed.

## 2021-10-21 NOTE — ED Notes (Signed)
Pt given snack. 

## 2021-10-21 NOTE — ED Provider Notes (Signed)
I was able to see the patient's rash with the nurse today.  Patient has circular slightly red areas mostly on his forearms slightly on his upper chest which are covered with fine flakes.  There is no rash on his stomach or palms.  Because of the circular area of the rash and the following flakiness it looks most consistent with ringworm or tinea corporis.  We will treat with terbinafine cream twice a day for 14 days.  We will have him change his clothes every day and get new ones and we will attempt to get him to shower more often.   Nena Polio, MD 10/21/21 [phone]

## 2021-10-21 NOTE — ED Notes (Signed)
Report received from Kimberly S , RN including SBAR. On initial round after report Pt is warm/dry, resting quietly in room without any s/s of distress.  Will continue to monitor throughout shift as ordered for any changes in behaviors and for continued safety.   

## 2021-10-21 NOTE — ED Provider Notes (Signed)
Emergency Medicine Observation Re-evaluation Note  Ryan Zuniga is a 41 y.o. male, seen on rounds today.   Physical Exam  BP (!) 130/91   Pulse 80   Temp 98.1 F (36.7 C) (Oral)   Resp 18   Ht '5\' 8"'$  (1.727 m)   Wt 68 kg   SpO2 98%   BMI 22.81 kg/m  Physical Exam General: Patient sleeping comfortably in bed Lungs: Patient not in respiratory distress Psych: Patient not currently combative  ED Course / MDM  EKG:     Plan  Current plan is for social work placement.  Patient has been complaining of a rash but gets very combative when he is try to be examined.  This happened yesterday.  Patient reportedly has not had a shower for several weeks.  We will try to encourage better hygiene in this gentleman.  If he complains of rash again I will try to go see him again today.  Fin Hupp is not under involuntary commitment.     Nena Polio, MD 10/21/21 [phone]

## 2021-10-22 DIAGNOSIS — F250 Schizoaffective disorder, bipolar type: Secondary | ICD-10-CM | POA: Diagnosis not present

## 2021-10-22 NOTE — ED Provider Notes (Signed)
-----------------------------------------   5:52 AM on 10/22/2021 -----------------------------------------   Blood pressure 134/84, pulse 78, temperature 97.9 F (36.6 C), temperature source Oral, resp. rate 16, height '5\' 8"'$  (1.727 m), weight 68 kg, SpO2 97 %.  The patient is calm and cooperative at this time.  There have been no acute events since the last update.  Awaiting disposition plan from Social Work team.   Paulette Blanch, MD 10/22/21 [phone]

## 2021-10-22 NOTE — ED Notes (Signed)
Pt refused sandwich tray. Pt given drink.

## 2021-10-22 NOTE — ED Notes (Signed)
Report received from Lynnell Grain, Conservation officer, nature. On initial round after report Pt is warm/dry, resting quietly in room without any s/s of distress.  Will continue to monitor throughout shift as ordered for any changes in behaviors and for continued safety.

## 2021-10-22 NOTE — ED Notes (Signed)
Hospital meal provided. Patient refused lunch and told me to get the fuck out of his room. Patient got out of bed and started coming at me, and I closed the door. Waste discarded appropriately.

## 2021-10-22 NOTE — ED Notes (Signed)
Patient compliant with ointment for rash, Nurse applied, but he states that He wants to wait and take shower tomorrow, Patient is safe, no signs of distress.

## 2021-10-22 NOTE — ED Notes (Signed)
Breakfast tray and beverage provided.

## 2021-10-22 NOTE — ED Notes (Signed)
Patient came out of room to go to the bathroom, and He ask nurse for coffee, nurse explained to him that He was not allowed coffee, and that He could possible earn that privilege back in time, He was agreeable, Nurse let him know about ointment that He needed for rash and He said that He would put it on today in a little while, nurse will check back with him soon. Patient is calm, no behavioral issues noted at this time.

## 2021-10-23 DIAGNOSIS — F250 Schizoaffective disorder, bipolar type: Secondary | ICD-10-CM | POA: Diagnosis not present

## 2021-10-23 NOTE — ED Notes (Signed)
Pt called to this nurse and said that he was sorry for being unkind the past two days and for cursing at staff. He asked if he could go back to the [place]. Pt told that was a possibility but he could no longer refuse his medications and could not have violent outbursts anymore. Pt verbalized understanding.

## 2021-10-23 NOTE — ED Notes (Signed)
Report to include Situation, Background, Assessment, and Recommendations received from [employer]. Patient alert and oriented, warm and dry, in no acute distress. Patient denies SI, HI, AVH and pain. Patient made aware of Q15 minute rounds and security cameras for their safety. Patient instructed to come to me with needs or concerns.

## 2021-10-23 NOTE — ED Notes (Signed)
Pt had closed the blinds on the door. Writer when to open the blinds for 15 minute checks. When opened to see into rm pt was standing in front of the door giving the middle finger. Pt then started screaming. Writer was unable to console pt.

## 2021-10-23 NOTE — ED Notes (Signed)
Hospital meal provided.  100% consumed, pt tolerated w/o complaints.  Waste discarded appropriately.   

## 2021-10-23 NOTE — ED Notes (Signed)
Snack and beverage given. 

## 2021-10-23 NOTE — ED Notes (Signed)
Breakfast tray and beverage provided

## 2021-10-23 NOTE — ED Notes (Signed)
Vol Pending Placement moved to [place]

## 2021-10-23 NOTE — ED Notes (Signed)
VOL/pending placement 

## 2021-10-23 NOTE — ED Notes (Signed)
Pt refused to have VS taken and states he will not take his medications . MD aware.

## 2021-10-24 DIAGNOSIS — F250 Schizoaffective disorder, bipolar type: Secondary | ICD-10-CM | POA: Diagnosis not present

## 2021-10-24 MED ORDER — LORAZEPAM 2 MG/ML IJ SOLN
INTRAMUSCULAR | Status: AC
  Administered 2021-10-24: 2 mg via INTRAMUSCULAR
  Filled 2021-10-24: qty 1

## 2021-10-24 MED ORDER — HALOPERIDOL LACTATE 5 MG/ML IJ SOLN: 5.0000 mg | Freq: Once | INTRAMUSCULAR | Status: AC

## 2021-10-24 MED ORDER — LORAZEPAM 2 MG/ML IJ SOLN: 2.0000 mg | Freq: Once | INTRAMUSCULAR | Status: AC

## 2021-10-24 MED ORDER — HALOPERIDOL LACTATE 5 MG/ML IJ SOLN
INTRAMUSCULAR | Status: AC
  Administered 2021-10-24: 5 mg via INTRAMUSCULAR
  Filled 2021-10-24: qty 1

## 2021-10-24 MED ORDER — DIPHENHYDRAMINE HCL 50 MG/ML IJ SOLN
INTRAMUSCULAR | Status: AC
  Administered 2021-10-24: 50 mg via INTRAMUSCULAR
  Filled 2021-10-24: qty 1

## 2021-10-24 MED ORDER — DIPHENHYDRAMINE HCL 50 MG/ML IJ SOLN: 50.0000 mg | Freq: Once | INTRAMUSCULAR | Status: AC

## 2021-10-24 NOTE — ED Notes (Signed)
Report to include Situation, Background, Assessment, and Recommendations received from [facility]. Patient alert and oriented, warm and dry, in no acute distress. Patient denies SI, HI, AVH and pain. Patient made aware of Q15 minute rounds and security cameras for their safety. Patient instructed to come to me with needs or concerns.

## 2021-10-24 NOTE — ED Notes (Signed)
Report received from Malachi Pro, Conservation officer, nature. On initial round after report Pt is warm/dry, resting quietly in room without any s/s of distress.  Will continue to monitor throughout shift as ordered for any changes in behaviors and for continued safety.

## 2021-10-24 NOTE — ED Provider Notes (Signed)
Emergency Medicine Observation Re-evaluation Note  Ryan Zuniga is a 41 y.o. male, seen on rounds today.  Pt initially presented to the ED for complaints of Suicidal and Homicidal Currently, the patient is resting comfortably in his room and has no acute complaints.  Physical Exam  BP 120/88 (BP Location: Left Arm)   Pulse 88   Temp 97.8 F (36.6 C) (Oral)   Resp 16   Ht '5\' 8"'$  (1.727 m)   Wt 68 kg   SpO2 98%   BMI 22.81 kg/m  Physical Exam General: No distress Cardiac: Well perfused Lungs: Normal respiratory effort Psych: Calm and cooperative  ED Course / MDM   I have reviewed the labs performed to date as well as medications administered while in observation.  There have been no significant changes to his status in the last 24 hours.  Plan  Current plan is for placement.  Derral Colucci is not under involuntary commitment.     Arta Silence, MD 10/24/21 1419

## 2021-10-24 NOTE — ED Notes (Signed)
Pt became aggressive towards staff posturing and cursing.  Pt came to nursing station continuing to yell.  Pt was verbally redirected several times to no avail.  Patient then started yelling in day room in front of peers causing anxiety in milieu.  PRN ordered requested

## 2021-10-24 NOTE — ED Notes (Signed)
Unlocked bathroom door to allow patient to shower.  Staff continuously monitored dayroom outside of bathroom door during pt shower.  Pt was given hygiene items as well as:  I clean top, 1 clean bottom, with 1 pair of disposable underwear.  Pt changed out into clean clothing.  Staff disposed of all shower supplies. Shower room cleaned and secured for next use.

## 2021-10-24 NOTE — ED Notes (Signed)
Pt given IM PRN w/ security x3 & staff x2 in room.  Pt sat on bed for administration of medication. Pt encouraged to stay in room to quietly practice coping skills.  Cont to monitor as ordered.

## 2021-10-24 NOTE — ED Notes (Signed)
VOL/pending placement 

## 2021-10-24 NOTE — ED Notes (Signed)
Pt given dinner tray by this tech. Pt requested new drink. This tech asked pt to throw away a single cup. Pt became very agitated, threw away his dinner tray before eating and telling staff to leave him alone. Will continue to monitor.
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
## 2021-10-25 DIAGNOSIS — F250 Schizoaffective disorder, bipolar type: Secondary | ICD-10-CM | POA: Diagnosis not present

## 2021-10-25 NOTE — ED Notes (Signed)
Hospital meal provided, pt tolerated w/o complaints.  Waste discarded appropriately.  

## 2021-10-25 NOTE — ED Notes (Signed)
VOL/Pending Placement 

## 2021-10-25 NOTE — ED Notes (Signed)
Report to include Situation, Background, Assessment, and Recommendations received from [facility]. Patient alert and oriented, warm and dry, in no acute distress. Patient denies SI, HI, AVH and pain. Patient made aware of Q15 minute rounds and security cameras for their safety. Patient instructed to come to me with needs or concerns.

## 2021-10-25 NOTE — ED Notes (Signed)
Report received from Malachi Pro, Conservation officer, nature. On initial round after report Pt is warm/dry, resting quietly in room without any s/s of distress.  Will continue to monitor throughout shift as ordered for any changes in behaviors and for continued safety.

## 2021-10-25 NOTE — ED Notes (Signed)
Patient provided snack at appropriate snack time.  Pt consumed 100% of snack provided, tolerated well w/o complaints   Trash disposted of appropriately by patient.  

## 2021-10-25 NOTE — ED Notes (Signed)
Snack and beverage given. 

## 2021-10-26 DIAGNOSIS — F250 Schizoaffective disorder, bipolar type: Secondary | ICD-10-CM | POA: Diagnosis not present

## 2021-10-26 NOTE — ED Notes (Signed)
Nurse gave Patient cup of coffee in exchange for 4 cups, He has 8 cups in room at this time, we have went from 15 to 8, He has remained calm and is redirectable, will continue to monitor and work with him. Patient is safe.

## 2021-10-26 NOTE — TOC Progression Note (Signed)
Transition of Care [facility]) - Progression Note    Patient Details  Name: Ryan Zuniga MRN: [id number] Date of Birth: 23-Aug-1980  [facility]) CM/SW Contact  Shelbie Hutching, RN Phone Number: 10/26/2021, 9:00 AM  Clinical Narrative:    TOC reached out to patient's guardian with [employer] and [employer], there have been no updates since 6/30.  Reached out to [place] and added them to secure email with [facility] and [employer] for further guidance.     Expected Discharge Plan: Group Home Barriers to Discharge: Other (must enter comment) (Group home will not take back)  Expected Discharge Plan and Services Expected Discharge Plan: Group Home   Discharge Planning Services: CM Consult   Living arrangements for the past 2 months: Group Home                 DME Arranged: N/A DME Agency: NA       HH Arranged: NA HH Agency: NA         Social Determinants of Health (SDOH) Interventions    Readmission Risk Interventions     No data to display

## 2021-10-26 NOTE — ED Notes (Signed)
Patient came out of room and talked to nurse and did take His morning medications, states that He would like more coffee , Nurse let him know that He could have another cup with lunch if He would throw away some of His trash. Patient agreed.

## 2021-10-26 NOTE — ED Provider Notes (Signed)
Emergency Medicine Observation Re-evaluation Note  Ryan Zuniga is a 41 y.o. male, seen on rounds today.  Pt initially presented to the ED for complaints of Suicidal and Homicidal Currently, the patient is denying any acute concerns.  Physical Exam  BP (!) 135/93 (BP Location: Right Arm)   Pulse 87   Temp 98 F (36.7 C) (Oral)   Resp 17   Ht '5\' 8"'$  (1.727 m)   Wt 68 kg   SpO2 99%   BMI 22.81 kg/m  Physical Exam General: no acute distress  Psych: calm  ED Course / MDM  EKG:   I have reviewed the labs performed to date as well as medications administered while in observation.  Recent changes in the last 24 hours include none.  Plan  Current plan is for social work dispo.  Ryan Zuniga is not under involuntary commitment.     Lucrezia Starch, MD 10/26/21 [phone]

## 2021-10-26 NOTE — ED Notes (Signed)
Patient got dinner tray and a drink.

## 2021-10-26 NOTE — ED Notes (Signed)
Vol /pending placement 

## 2021-10-26 NOTE — ED Notes (Signed)
Patient refused medications , told him nurse would be back to administer and He states " IM ok " Nurse will continue to monitor.

## 2021-10-26 NOTE — ED Notes (Signed)
Patient asking for more coffee, nurse let him know that He would have to wait until supper and it is only if He picks up more of his trash in room. Patient is cooperative, and calm at this time.

## 2021-10-26 NOTE — ED Notes (Signed)
Pt given snack. 

## 2021-10-26 NOTE — ED Notes (Signed)
Patient talked to Dad on the phone and He remains calm and cooperative.

## 2021-10-27 DIAGNOSIS — F250 Schizoaffective disorder, bipolar type: Secondary | ICD-10-CM | POA: Diagnosis not present

## 2021-10-27 MED ORDER — ZIPRASIDONE MESYLATE 20 MG IM SOLR: 20.0000 mg | Freq: Once | INTRAMUSCULAR | Status: AC

## 2021-10-27 MED ORDER — ZIPRASIDONE MESYLATE 20 MG IM SOLR
INTRAMUSCULAR | Status: AC
  Administered 2021-10-27: 20 mg via INTRAMUSCULAR
  Filled 2021-10-27: qty 20

## 2021-10-27 NOTE — ED Notes (Signed)
Pt came to staff and apologized for behavior this a.m. was agreeable and took a.m. medications as well as allowed staff to apply topical ointment

## 2021-10-27 NOTE — ED Notes (Signed)
Pt has been to nursing station multiple times requesting his remote and the telephone. Pt is cursing and posturing towards staff.  Staff redirected pt to room until breakfast, reminded him that he needs to be compliant with medications and unit rules before his remove can be returned.  Staff also informed him that he would be able to use the phone at 0900 as long as he remained calm.

## 2021-10-27 NOTE — ED Notes (Signed)
Pt continues to yell, disturbing peers.  Is running in and out of his room, security assisted with administration of PRN medication.  Cont to monitor as ordered

## 2021-10-27 NOTE — ED Notes (Signed)
Hospital meal provided, pt tolerated w/o complaints.  Waste discarded appropriately.  

## 2021-10-27 NOTE — ED Notes (Signed)
Report received from Kimberly S , RN including SBAR. On initial round after report Pt is warm/dry, resting quietly in room without any s/s of distress.  Will continue to monitor throughout shift as ordered for any changes in behaviors and for continued safety.   

## 2021-10-27 NOTE — ED Notes (Signed)
VOL PENDING PLACMENT

## 2021-10-27 NOTE — ED Notes (Signed)
Snack and drink given 

## 2021-10-27 NOTE — ED Notes (Signed)
Pt used restrooms

## 2021-10-27 NOTE — ED Notes (Signed)
Patient provided snack at appropriate snack time.  Pt consumed 100% of snack provided, tolerated well w/o complaints   Trash disposted of appropriately by patient.  

## 2021-10-27 NOTE — ED Notes (Signed)
Patient refused medicated lotion. Patient became verbally aggressive and cursing at Probation officer. TV cut off and remote took away due to behaviors.

## 2021-10-27 NOTE — TOC Progression Note (Signed)
Transition of Care [facility]) - Progression Note    Patient Details  Name: Ballard Budney MRN: [id number] Date of Birth: 11/14/1980  [facility]) CM/SW Contact  Shelbie Hutching, RN Phone Number: 10/27/2021, 1:29 PM  Clinical Narrative:    [facility] leadership reaching out to [facility] leadership to ask them to prioritize finding placement as patient has been here for 110 days.    Expected Discharge Plan: Group Home Barriers to Discharge: Other (must enter comment) (Group home will not take back)  Expected Discharge Plan and Services Expected Discharge Plan: Group Home   Discharge Planning Services: CM Consult   Living arrangements for the past 2 months: Group Home                 DME Arranged: N/A DME Agency: NA       HH Arranged: NA HH Agency: NA         Social Determinants of Health (SDOH) Interventions    Readmission Risk Interventions     No data to display

## 2021-10-28 DIAGNOSIS — F250 Schizoaffective disorder, bipolar type: Secondary | ICD-10-CM | POA: Diagnosis not present

## 2021-10-28 NOTE — ED Notes (Signed)
PT REMAINS VOL. WITH PLACEMENT PENDING. MOST RECENT NOTE ON CHART

## 2021-10-28 NOTE — ED Provider Notes (Signed)
Emergency Medicine Observation Re-evaluation Note  Ryan Zuniga is a 41 y.o. male, seen on rounds today.  Pt initially presented to the ED for complaints of Suicidal and Homicidal Currently, the patient is awaiting placement in a group home.  Physical Exam  BP 138/89 (BP Location: Right Arm)   Pulse 89   Temp 98 F (36.7 C) (Oral)   Resp 16   Ht '5\' 8"'$  (1.727 m)   Wt 68 kg   SpO2 97%   BMI 22.81 kg/m    ED Course / MDM  EKG:   I have reviewed the labs performed to date.  Recent changes in the last 24 hours include None.  Plan  Current plan is for group home placement.  Ryan Zuniga is not under involuntary commitment.     Nance Pear, MD 10/28/21 1345

## 2021-10-28 NOTE — TOC Progression Note (Signed)
Transition of Care [facility]) - Progression Note    Patient Details  Name: Ryan Zuniga MRN: [id number] Date of Birth: 1980/06/26  Transition of Care [facility]) CM/SW Contact  Shelbie Hutching, RN Phone Number: 10/28/2021, 2:57 PM  Clinical Narrative:    Oley Balm with [facility] has gotten the IQ testing results from the guardian, she has reached out to [facility] and they will be working on IDD placement instead of mental health placement.  [facility] has one facility that services IDD.     Expected Discharge Plan: Group Home Barriers to Discharge: Other (must enter comment) (Group home will not take back)  Expected Discharge Plan and Services Expected Discharge Plan: Group Home   Discharge Planning Services: CM Consult   Living arrangements for the past 2 months: Group Home                 DME Arranged: N/A DME Agency: NA       HH Arranged: NA HH Agency: NA         Social Determinants of Health (SDOH) Interventions    Readmission Risk Interventions     No data to display

## 2021-10-28 NOTE — ED Notes (Signed)
Report received from Kimberly S , RN including SBAR. On initial round after report Pt is warm/dry, resting quietly in room without any s/s of distress.  Will continue to monitor throughout shift as ordered for any changes in behaviors and for continued safety.   

## 2021-10-28 NOTE — ED Notes (Signed)
Report received from [place], Conservation officer, nature. Patient alert and oriented, warm and dry, in no acute distress. Patient denies SI, HI, AVH and pain. Patient made aware of Q15 minute rounds and security cameras for their safety. Patient instructed to come to this nurse with needs or concerns.

## 2021-10-28 NOTE — ED Notes (Addendum)
Hospital meal provided, but patient refused his lunch tray.

## 2021-10-28 NOTE — ED Notes (Signed)
Snack and drink given 

## 2021-10-29 DIAGNOSIS — F250 Schizoaffective disorder, bipolar type: Secondary | ICD-10-CM | POA: Diagnosis not present

## 2021-10-29 NOTE — ED Notes (Signed)
Report received from RN including SBAR. Patient alert and oriented, warm and dry, and agitated. Pt refuses to talk, left alone.

## 2021-10-29 NOTE — ED Notes (Signed)
Pt refusing vitals and breakfast at this time. Will continue to monitor.

## 2021-10-29 NOTE — ED Provider Notes (Signed)
Emergency Medicine Observation Re-evaluation Note  Ryan Zuniga is a 41 y.o. male, seen on rounds today.  Pt initially presented to the ED for complaints of Suicidal and Homicidal Currently, the patient is calm.  Physical Exam  BP (!) 164/97 (BP Location: Left Arm)   Pulse 81   Temp 98.5 F (36.9 C) (Oral)   Resp 16   Ht '5\' 8"'$  (1.727 m)   Wt 68 kg   SpO2 96%   BMI 22.81 kg/m    ED Course / MDM  EKG:   I have reviewed the labs performed to date as well as medications administered while in observation.  Recent changes in the last 24 hours include None.  Plan  Current plan is for SW/TOC placement.  Ryan Zuniga is not under involuntary commitment.     Duffy Bruce, MD 10/29/21 [phone]

## 2021-10-29 NOTE — ED Notes (Signed)
Pt agreed to take a shower.  Clean scrubs and supplies given.

## 2021-10-29 NOTE — ED Notes (Signed)
Pt refused vital signs and nighttime snack.

## 2021-10-29 NOTE — ED Notes (Signed)
Pt refusing morning medications

## 2021-10-29 NOTE — ED Notes (Signed)
Medicated cream applied to pt's rash with EDT present.

## 2021-10-30 DIAGNOSIS — F250 Schizoaffective disorder, bipolar type: Secondary | ICD-10-CM | POA: Diagnosis not present

## 2021-10-30 NOTE — ED Notes (Signed)
VOl placement most recent note on chart

## 2021-10-30 NOTE — ED Notes (Signed)
Report to include Situation, Background, Assessment, and Recommendations received from [facility]. Patient alert and oriented, warm and dry, in no acute distress. Patient denies SI, HI, AVH and pain. Patient made aware of Q15 minute rounds and security cameras for their safety. Patient instructed to come to me with needs or concerns.

## 2021-10-30 NOTE — ED Notes (Signed)
Snack and beverage given. 

## 2021-10-30 NOTE — ED Notes (Signed)
VOL/Pending Placement 

## 2021-10-31 DIAGNOSIS — F250 Schizoaffective disorder, bipolar type: Secondary | ICD-10-CM | POA: Diagnosis not present

## 2021-10-31 NOTE — ED Notes (Signed)
VOL/pending placement 

## 2021-10-31 NOTE — ED Notes (Signed)
Pt's BP 155/101. Pt on propanolol, EDP Dr Jari Pigg notified and she states to monitor pt for now. No new orders.

## 2021-10-31 NOTE — ED Notes (Signed)
Pt given ice cream at this time.

## 2021-10-31 NOTE — ED Notes (Signed)
Pt given sprite, okayed by Jerral Bonito.

## 2021-10-31 NOTE — ED Provider Notes (Signed)
Emergency Medicine Observation Re-evaluation Note  Ryan Zuniga is a 41 y.o. male, seen on rounds today.  Pt initially presented to the ED for complaints of Suicidal and Homicidal  Currently, the patient is calm resting in bed - no issues overnight per B[facility] nurse   Physical Exam  Blood pressure (!) 153/94, pulse 83, temperature (!) 97.3 F (36.3 C), temperature source Oral, resp. rate 18, height '5\' 8"'$  (1.727 m), weight 68 kg, SpO2 98 %.  Physical Exam General: No apparent distress Pulm: Normal WOB Neuro: resting      ED Course / MDM     I have reviewed the labs performed to date as well as medications administered while in observation.  Recent changes in the last 24 hours include none  Plan   Current plan is to continue to wait for placement  Patient is not under full IVC at this time.   Vanessa [place], MD 10/31/21 [phone]

## 2021-10-31 NOTE — ED Notes (Signed)
Report to include Situation, Background, Assessment, and Recommendations received from [facility]. Patient alert and oriented, warm and dry, in no acute distress. Patient denies SI, HI, AVH and pain. Patient made aware of Q15 minute rounds and security cameras for their safety. Patient instructed to come to me with needs or concerns.

## 2021-10-31 NOTE — ED Notes (Signed)
Snack and beverage given. 

## 2021-10-31 NOTE — ED Notes (Signed)
Pt given breakfast tray and drink 

## 2021-10-31 NOTE — ED Notes (Signed)
Pt given dinner tray and drink at this time. 

## 2021-11-01 DIAGNOSIS — F250 Schizoaffective disorder, bipolar type: Secondary | ICD-10-CM | POA: Diagnosis not present

## 2021-11-01 NOTE — ED Notes (Signed)
Pt was given breakfast and a drink at this time.

## 2021-11-01 NOTE — ED Notes (Signed)
VOL/pending Placement 

## 2021-11-01 NOTE — ED Notes (Signed)
Report to include Situation, Background, Assessment, and Recommendations received from [facility]. Patient alert and oriented, warm and dry, in no acute distress. Patient denies SI, HI, AVH and pain. Patient made aware of Q15 minute rounds and security cameras for their safety. Patient instructed to come to me with needs or concerns.

## 2021-11-01 NOTE — ED Notes (Signed)
Pt refusing dinner tray

## 2021-11-01 NOTE — ED Notes (Signed)
Snack and beverage given. 

## 2021-11-01 NOTE — ED Notes (Signed)
Report received from Malachi Pro, Conservation officer, nature. On initial round after report Pt is warm/dry, resting quietly in room without any s/s of distress.  Will continue to monitor throughout shift as ordered for any changes in behaviors and for continued safety.

## 2021-11-01 NOTE — ED Provider Notes (Signed)
Emergency Medicine Observation Re-evaluation Note  Woody Kronberg is a 41 y.o. male, seen on rounds today.  Pt initially presented to the ED for complaints of Suicidal and Homicidal Currently, the patient is resting comfortably.  Physical Exam  BP (!) 145/94   Pulse 80   Temp 98 F (36.7 C) (Oral)   Resp 17   Ht '5\' 8"'$  (1.727 m)   Wt 68 kg   SpO2 97%   BMI 22.81 kg/m  Physical Exam General: No acute distress Cardiac: Well-perfused extremities Lungs: No respiratory distress Psych: Appropriate mood and affect  ED Course / MDM  EKG:   I have reviewed the labs performed to date as well as medications administered while in observation.  Recent changes in the last 24 hours include none.  Plan  Current plan is for placement.  Hawken Bielby is not under involuntary commitment.     Naaman Plummer, MD 11/01/21 [phone]

## 2021-11-01 NOTE — ED Notes (Signed)
Patient provided snack at appropriate snack time.  Pt consumed 100% of snack provided, tolerated well w/o complaints   Trash disposted of appropriately by patient.  

## 2021-11-01 NOTE — ED Notes (Signed)
Pt came to staff and expressed that "I feel like I'm losing my mind, I've been locked inside here for four months, with no fresh air, no sun shine, no anything"  pt stated that he did not know "how much longer he can take it"  staff tried to assure pt that our [place] as well as his guardian were activily working on finding placement for him.  Pt requested in person meeting with TOC and with his guardian to express his concerns and frustrations.  Staff assured pt that this would be passed onto the [facility] staff.

## 2021-11-02 DIAGNOSIS — F250 Schizoaffective disorder, bipolar type: Secondary | ICD-10-CM | POA: Diagnosis not present

## 2021-11-02 MED ORDER — HYDROCHLOROTHIAZIDE 12.5 MG PO TABS
12.5000 mg | ORAL_TABLET | Freq: Every day | ORAL | Status: DC
  Administered 2021-11-02 – 2022-01-28 (×82): 12.5 mg via ORAL
  Filled 2021-11-02 (×105): qty 1

## 2021-11-02 NOTE — ED Provider Notes (Signed)
Emergency Medicine Observation Re-evaluation Note  Ryan Zuniga is a 41 y.o. male, seen on rounds today.    Physical Exam  BP (!) 143/95 (BP Location: Left Arm)   Pulse 76   Temp 98 F (36.7 C) (Oral)   Resp 17   Ht '5\' 8"'$  (1.727 m)   Wt 68 kg   SpO2 100%   BMI 22.81 kg/m  Physical Exam General: Patient resting comfortably in bed Lungs: Patient not in respiratory distress Psych: Patient not combative  ED Course / MDM  EKG:        Plan  Current plan is for social work placement.  Patient's blood pressure is again slightly elevated.  We will continue to watch this.  I will start him on HCTZ .  He has had multiple readings which been high but he also had a few within normal limits.  In the last 2 weeks almost every day at least 1 reading has been at least slightly elevated.  Berle Fitz is not under involuntary commitment.     Nena Polio, MD 11/02/21 [phone]

## 2021-11-02 NOTE — ED Notes (Signed)
Breakfast given at 930

## 2021-11-02 NOTE — ED Provider Notes (Signed)
I have started the patient on HCTZ.  His blood pressures are repeatedly however 140/90.  The best thing to do would be to check a met be in about a week or 2 if he still here.   Nena Polio, MD 11/02/21 [phone]

## 2021-11-02 NOTE — ED Notes (Signed)
VOl pending placement

## 2021-11-02 NOTE — ED Notes (Signed)
Patient spoke with His DAD on the phone, He remained calm and cooperative, no signs of distress, Patient has no behavioral issue, will continue to monitor.

## 2021-11-02 NOTE — ED Notes (Signed)
VOL  PENDING  PLACEMENT 

## 2021-11-02 NOTE — ED Notes (Signed)
Patient received lunch tray and beverage, He is calm and cooperative today, denies Si/hi or avh, camera surveillance in progress for safety and q 15 minute checks.

## 2021-11-02 NOTE — ED Notes (Signed)
Pt give snack

## 2021-11-02 NOTE — ED Notes (Signed)
Pt in darkened room with doors shut. When asked how he is doing he said go away.

## 2021-11-03 DIAGNOSIS — F250 Schizoaffective disorder, bipolar type: Secondary | ICD-10-CM | POA: Diagnosis not present

## 2021-11-03 NOTE — ED Notes (Signed)
Report received from Al[place], RN including SBAR. Patient alert and oriented, warm and dry, in no acute distress. Patient denies SI, HI, AVH and pain. Patient made aware of Q15 minute rounds and security cameras for their safety. Patient instructed to come to this nurse with needs or concerns. 

## 2021-11-03 NOTE — ED Notes (Signed)
Pt given snack. 

## 2021-11-03 NOTE — ED Notes (Signed)
VOL/pending placement 

## 2021-11-03 NOTE — ED Notes (Signed)
VOL  PENDING  PLACEMENT 

## 2021-11-03 NOTE — ED Notes (Signed)
Hospital meal provided.  100% consumed, pt tolerated w/o complaints.  Waste discarded appropriately.   

## 2021-11-03 NOTE — ED Notes (Signed)
Patient provided snack at appropriate snack time.  Pt consumed 100% of snack provided, tolerated well w/o complaints   Trash disposted of appropriately by patient.  

## 2021-11-03 NOTE — TOC Progression Note (Signed)
Transition of Care [facility]) - Progression Note    Patient Details  Name: Ryan Zuniga MRN: [id number] Date of Birth: 07-30-1980  Transition of Care [facility]) CM/SW [place], [place] Phone Number: 11/03/2021, 12:05 PM  Clinical Narrative:     CSW attempted to call [facility] for updates on patient's IDD placement, sent to [facility]' voicemail and unable to leave vm. CSW called [facility] back to inform and inquire of anyone covering her, they confirm she's out of office and no one is covering. They suggest CSW reach out to patient guardian for updates.   CSW reached out to [name] at [phone] who reports they are now working with [facility] for the [facility] to help get patient into [place].  Per [name], patient will be assessed by psychologist Dr. Luanna Salk on Thursday 7/20 from 9am-11am, as Jackquline Denmark needs a current IDD diagnosis from a psychological eval for patient to be admitted.   Pending psychological eval Thursday 7/20 for IDD dx documentation for patient to be placed at [facility].    Expected Discharge Plan: Group Home Barriers to Discharge: Other (must enter comment) (Group home will not take back)  Expected Discharge Plan and Services Expected Discharge Plan: Group Home   Discharge Planning Services: CM Consult   Living arrangements for the past 2 months: Group Home                 DME Arranged: N/A DME Agency: NA       HH Arranged: NA HH Agency: NA         Social Determinants of Health (SDOH) Interventions    Readmission Risk Interventions     No data to display

## 2021-11-03 NOTE — ED Notes (Signed)
Patient refused vital signs 

## 2021-11-03 NOTE — ED Notes (Signed)
Pt refused vs at this time, Gerald Stabs, RN made aware.

## 2021-11-03 NOTE — ED Notes (Signed)
Pt is agitated when spoken, to refuses all. Will allow to be left alone to decrease escalation.

## 2021-11-03 NOTE — ED Notes (Signed)
Pt given a snack at this time.  

## 2021-11-03 NOTE — ED Provider Notes (Signed)
Emergency Medicine Observation Re-evaluation Note  Ryan Zuniga is a 41 y.o. male, seen on rounds today.   Physical Exam  BP 122/68 (BP Location: Right Arm)   Pulse 68   Temp 97.9 F (36.6 C) (Oral)   Resp 18   Ht '5\' 8"'$  (1.727 m)   Wt 68 kg   SpO2 99%   BMI 22.81 kg/m  Physical Exam General: Patient sitting in chair drinking some water looks well Lungs: Patient not in respiratory distress Psych: Patient not combative  ED Course / MDM  EKG:   Started the patient on HCTZ yesterday.  Today blood pressures are better already.  We will continue to monitor his blood pressures.  Would plan would be to repeat a B MP here in a week or 2 to check his electrolytes to make sure they are stable  Plan  Current plan is for placement  Course as I mentioned above checking a BMP in about a week or 2.  Ryan Zuniga is not under involuntary commitment.     Nena Polio, MD 11/03/21 [phone]

## 2021-11-04 DIAGNOSIS — F250 Schizoaffective disorder, bipolar type: Secondary | ICD-10-CM | POA: Diagnosis not present

## 2021-11-04 NOTE — ED Notes (Signed)
VOL  PENDING  PLACEMENT 

## 2021-11-04 NOTE — ED Provider Notes (Signed)
Emergency Medicine Observation Re-evaluation Note  Ryan Zuniga is a 41 y.o. male, seen on rounds today.    Physical Exam  BP (!) 140/95 (BP Location: Left Arm)   Pulse 66   Temp 97.9 F (36.6 C) (Oral)   Resp 20   Ht _0  (1.727 m)   Wt 68 kg   SpO2 100%   BMI 22.81 kg/m  Physical Exam General: Patient pacing in his room but calm Lungs: Patient not in respiratory distress Psych: Patient not combative  ED Course / MDM  EKG:     Plan  Current plan is for patient will need a met be in about 10 days or on the 29th if he still here to make sure his electrolytes are okay after being on HCTZ for 2 weeks.  I started it because of his blood pressure being up.  It is slightly higher today than it was yesterday but he is only been on HCTZ for 2 days.  Patient is for placement.  Chirag Krueger is not under involuntary commitment.     Nena Polio, MD 11/04/21 [phone]

## 2021-11-04 NOTE — TOC Progression Note (Signed)
Transition of Care [facility]) - Progression Note    Patient Details  Name: Ryan Zuniga MRN: [id number] Date of Birth: 08-04-1980  Transition of Care [facility]) CM/SW Contact  Shelbie Hutching, RN Phone Number: 11/04/2021, 7:04 PM  Clinical Narrative:    Patient has been assigned an IDD Care Coordinator from [facility] [place]- [phone].  Patient has a psychological assessment scheduled for tomorrow.  Patient's guardian Justice will be coming here for the virtual assessment.    Expected Discharge Plan: Group Home Barriers to Discharge: Other (must enter comment) (Group home will not take back)  Expected Discharge Plan and Services Expected Discharge Plan: Group Home   Discharge Planning Services: CM Consult   Living arrangements for the past 2 months: Group Home                 DME Arranged: N/A DME Agency: NA       HH Arranged: NA HH Agency: NA         Social Determinants of Health (SDOH) Interventions    Readmission Risk Interventions     No data to display

## 2021-11-04 NOTE — ED Notes (Signed)
Hospital meal provided, pt tolerated w/o complaints.  Waste discarded appropriately.  

## 2021-11-04 NOTE — ED Notes (Signed)
Vol placement see note on chart

## 2021-11-04 NOTE — ED Notes (Signed)
Report received from Venida Jarvis, Conservation officer, nature. On initial round after report Pt is warm/dry, resting quietly in room without any s/s of distress.  Will continue to monitor throughout shift as ordered for any changes in behaviors and for continued safety.

## 2021-11-05 DIAGNOSIS — F250 Schizoaffective disorder, bipolar type: Secondary | ICD-10-CM | POA: Diagnosis not present

## 2021-11-05 MED ORDER — OLANZAPINE 10 MG IM SOLR
5.0000 mg | Freq: Once | INTRAMUSCULAR | Status: DC
  Filled 2021-11-05: qty 10

## 2021-11-05 NOTE — ED Notes (Signed)
Patient had multiple cups in room, refused to remove any cups and became verbally aggressive with staff. Patient coached on behavior and told he won't be allowed coffee due to behavior. Behavior coaching plan filled out in [place] area for patient.

## 2021-11-05 NOTE — ED Notes (Signed)
Pt has multiple cups in rm, this writer was trying to give pt dinner and drink so pt was asked for some cups, pt began cursing and yelling stating, "give my dinner away I don't want anything anyway and fuck that coffee too". Anderson [place], RN notified.

## 2021-11-05 NOTE — ED Notes (Signed)
Pt giving up cups in return of dinner tray and drink.

## 2021-11-05 NOTE — ED Notes (Signed)
VOl psych assessment completed was favorable  see note on chart

## 2021-11-05 NOTE — ED Notes (Signed)
Patient had guardian come to visit to do assessment for potential placement. Patient had been calm and cooperative this am. When guardian entered unit, patient ignored her and then came to nurse's station door asking for coffee and phone. Asked patient why he was not doing assessment (patient had been told I would get him coffee once he got settled talking to his guardian). Patient proceeded to yell "Fuck that bitch. I'm not talking to her. I need the phone and coffee." Advised patient that behavior would not earn him either coffee or phone, that the assessment was meant to provide him placement and a way out of the ER. Patient continued to yell "Fuck that bitch. I'll kill her." Patient refused to speak to guardian or do assessment. MD notified for need for medication. Per MD, try oral medication first and then IM if he won't take. Patient initially refused to take medication. Once this RN was drawing up injection, patient agreed to take oral medications. PRN Zyprexa given with morning medications (verified with pharmacy that '20mg'$  Zyprexa was appropriate/safe given scheduled dose and PRN dose were '10mg'$  each). Patient agreed to do assessment if he was given coffee.

## 2021-11-05 NOTE — ED Notes (Signed)
Patient currently doing assessment with guardian for possible placement. Coffee given.

## 2021-11-05 NOTE — TOC Progression Note (Signed)
Transition of Care [facility]) - Progression Note    Patient Details  Name: Ryan Zuniga MRN: [id number] Date of Birth: Jul 09, 1980  Transition of Care [facility]) CM/SW Contact  Shelbie Hutching, RN Phone Number: 11/05/2021, 11:10 AM  Clinical Narrative:    Psychological assessment completed this morning with the help of  Guardian, Justice Carpenter from [employer].  IDD Care Coordinator will be able to use the assessment to assist with placement.  Per Justice the assessment went really well and patient did better than expected.     Expected Discharge Plan: Group Home Barriers to Discharge: Other (must enter comment) (Group home will not take back)  Expected Discharge Plan and Services Expected Discharge Plan: Group Home   Discharge Planning Services: CM Consult   Living arrangements for the past 2 months: Group Home                 DME Arranged: N/A DME Agency: NA       HH Arranged: NA HH Agency: NA         Social Determinants of Health (SDOH) Interventions    Readmission Risk Interventions     No data to display

## 2021-11-05 NOTE — ED Notes (Signed)
Hospital meal provided, pt tolerated w/o complaints.  Waste discarded appropriately.  

## 2021-11-05 NOTE — ED Notes (Signed)
Report received from [place], Conservation officer, nature. Patient alert and oriented, warm and dry, in no acute distress. Patient denies SI, HI, AVH and pain. Patient made aware of Q15 minute rounds and security cameras for their safety. Patient instructed to come to this nurse with needs or concerns.

## 2021-11-05 NOTE — ED Provider Notes (Signed)
Emergency Medicine Observation Re-evaluation Note  Ryan Zuniga is a 41 y.o. male, seen on rounds today.  Pt initially presented to the ED for complaints of Suicidal and Homicidal  Currently, the patient got kind of agitated when his group home leader came in.  Physical Exam  Blood pressure (!) 141/100, pulse 84, temperature 98.3 F (36.8 C), temperature source Oral, resp. rate 16, height '5\' 8"'$  (1.727 m), weight 68 kg, SpO2 98 %.  Physical Exam General: No apparent distress Pulm: Normal WOB Neuro: Agitated  ED Course / MDM     I have reviewed the labs performed to date as well as medications administered while in observation.   Plan   Current plan is to continue to wait for /placement if felt warranted  Patient is not under full IVC at this time.  Nurse alerted me of some agitation offered to do an IM injection but patient took the oral medication   Vanessa Owasa, MD 11/05/21 1135

## 2021-11-05 NOTE — ED Notes (Signed)
We went to each room and asked for any trash to be thrown away. Patient became very angry "FUCK YOU those are my cups" [place]". We explained we didn't want to take away anything, but lose trash. He was upset and told us to "FUCK OFF".

## 2021-11-05 NOTE — ED Notes (Signed)
Pt has become angry with this nurse as this nurse could not understand what the pt was saying. Pt refused snack and medications after situation.

## 2021-11-06 DIAGNOSIS — F250 Schizoaffective disorder, bipolar type: Secondary | ICD-10-CM | POA: Diagnosis not present

## 2021-11-06 NOTE — ED Notes (Signed)
Report to include Situation, Background, Assessment, and Recommendations received from [facility]. Patient alert and oriented, warm and dry, in no acute distress. Patient denies SI, HI, AVH and pain. Patient made aware of Q15 minute rounds and security cameras for their safety. Patient instructed to come to me with needs or concerns.

## 2021-11-06 NOTE — ED Notes (Signed)
Patient given lunch tray.

## 2021-11-06 NOTE — ED Notes (Signed)
Vol /pending placement 

## 2021-11-06 NOTE — ED Notes (Signed)
Pt refused vitals at this time.

## 2021-11-06 NOTE — ED Notes (Signed)
Pt had breakfast tray at this time.

## 2021-11-06 NOTE — ED Notes (Signed)
VOL  PENDING  PLACEMENT 

## 2021-11-06 NOTE — ED Notes (Signed)
Snack and beverage given. 

## 2021-11-07 DIAGNOSIS — F250 Schizoaffective disorder, bipolar type: Secondary | ICD-10-CM | POA: Diagnosis not present

## 2021-11-07 NOTE — ED Notes (Signed)
Snack and beverage given. 

## 2021-11-07 NOTE — ED Notes (Signed)
VOL/Pending Placement 

## 2021-11-07 NOTE — ED Notes (Signed)
Pt given lunch tray and drink, refusing lunch tray at this time.

## 2021-11-07 NOTE — ED Provider Notes (Signed)
Emergency Medicine Observation Re-evaluation Note  Ryan Zuniga is a 41 y.o. male, seen on rounds today.   Physical Exam  BP 124/68 (BP Location: Right Arm)   Pulse 72   Temp 98 F (36.7 C) (Oral)   Resp 18   Ht '5\' 8"'$  (1.727 m)   Wt 68 kg   SpO2 99%   BMI 22.81 kg/m  Physical Exam General: Patient resting comfortably in bed Lungs: Patient not in respiratory distress Psych: Patient not combative  ED Course / MDM  EKG:     Plan  Current plan is for placement.  Patient will need a BMP in about 8 more days to make sure his electrolytes are okay after being started on HCTZ.  Patient's blood pressure is now okay.  We will continue to monitor all this.  Ryan Zuniga is not under involuntary commitment.     Nena Polio, MD 11/07/21 0800

## 2021-11-07 NOTE — ED Notes (Signed)
VOl placement see note on chart

## 2021-11-07 NOTE — ED Notes (Signed)
Report to include Situation, Background, Assessment, and Recommendations received from [facility]. Patient alert and oriented, warm and dry, in no acute distress. Patient denies SI, HI, AVH and pain. Patient made aware of Q15 minute rounds and security cameras for their safety. Patient instructed to come to me with needs or concerns.

## 2021-11-08 DIAGNOSIS — F250 Schizoaffective disorder, bipolar type: Secondary | ICD-10-CM | POA: Diagnosis not present

## 2021-11-08 NOTE — ED Notes (Signed)
VOL/pending placement 

## 2021-11-08 NOTE — ED Notes (Signed)
Report to include Situation, Background, Assessment, and Recommendations received from [facility]. Patient alert and oriented, warm and dry, in no acute distress. Patient denies SI, HI, AVH and pain. Patient made aware of Q15 minute rounds and security cameras for their safety. Patient instructed to come to me with needs or concerns.

## 2021-11-08 NOTE — ED Notes (Signed)
Report received from Malachi Pro, Conservation officer, nature. On initial round after report Pt is warm/dry, resting quietly in room without any s/s of distress.  Will continue to monitor throughout shift as ordered for any changes in behaviors and for continued safety.

## 2021-11-09 DIAGNOSIS — F250 Schizoaffective disorder, bipolar type: Secondary | ICD-10-CM | POA: Diagnosis not present

## 2021-11-09 NOTE — ED Notes (Signed)
Pt given snack. 

## 2021-11-09 NOTE — ED Notes (Signed)
Pt given snack and beverage. 

## 2021-11-09 NOTE — ED Notes (Signed)
Pt given lunch tray.

## 2021-11-09 NOTE — ED Notes (Addendum)
Breakfast tray and coffee given to pt. Pt remained in room to eat.

## 2021-11-09 NOTE — ED Notes (Signed)
Report received from Seth Bake, Conservation officer, nature. Patient alert and oriented, warm and dry, in no acute distress, and annoyed with staff. Patient refuses to talk. Patient made aware of Q15 minute rounds and security cameras for their safety. Patient instructed to come to this nurse with needs or concerns.

## 2021-11-09 NOTE — ED Notes (Signed)
VOL  PENDING  PLACEMENT 

## 2021-11-09 NOTE — TOC Progression Note (Signed)
Transition of Care [facility]) - Progression Note    Patient Details  Name: Ryan Zuniga MRN: [id number] Date of Birth: Mar 17, 1981  Transition [facility] Care [facility]) CM/SW Contact  Shelbie Hutching, RN Phone Number: 11/09/2021, 3:00 PM  Clinical Narrative:    Once the psychiatrist comes back with the psychological report they can continue with IDD placement.  Per the psychiatrist and LME with Venetia Constable this can take up to a month to come back.     Expected Discharge Plan: Group Home Barriers to Discharge: Other (must enter comment) (Group home will not take back)  Expected Discharge Plan and Services Expected Discharge Plan: Group Home   Discharge Planning Services: CM Consult   Living arrangements for the past 2 months: Group Home                 DME Arranged: N/A DME Agency: NA       HH Arranged: NA HH Agency: NA         Social Determinants of Health (SDOH) Interventions    Readmission Risk Interventions     No data to display

## 2021-11-09 NOTE — ED Provider Notes (Signed)
Emergency Medicine Observation Re-evaluation Note  Ryan Zuniga is a 41 y.o. male, seen on rounds today.  Pt initially presented to the ED for complaints of Suicidal and Homicidal Currently, the patient is calm, resting.  Physical Exam  BP (!) 142/84 (BP Location: Right Arm)   Pulse 81   Temp 98.4 F (36.9 C) (Oral)   Resp 18   Ht '5\' 8"'$  (1.727 m)   Wt 68 kg   SpO2 99%   BMI 22.81 kg/m    ED Course / MDM  EKG:   I have reviewed the labs performed to date as well as medications administered while in observation.  Recent changes in the last 24 hours include none.  Plan  Current plan is for SW dispo.  Ryan Zuniga is not under involuntary commitment.     Duffy Bruce, MD 11/09/21 [phone]

## 2021-11-09 NOTE — ED Notes (Signed)
VOL/Pending Placement 

## 2021-11-09 NOTE — ED Notes (Signed)
Pt refuses to speak to staff, remains in room and demands nobody enter. Pt refuses vitals. Will continue to monitor.

## 2021-11-10 DIAGNOSIS — F250 Schizoaffective disorder, bipolar type: Secondary | ICD-10-CM | POA: Diagnosis not present

## 2021-11-10 NOTE — ED Notes (Signed)
Vol /pending placement 

## 2021-11-10 NOTE — ED Notes (Signed)
Pt has continued to refuse vitals. Did accept medications

## 2021-11-10 NOTE — ED Notes (Signed)
Pt refused dinner tray.  Was given a sprite.

## 2021-11-10 NOTE — ED Notes (Signed)
Report received from Venida Jarvis, Conservation officer, nature. On initial round after report Pt is warm/dry, resting quietly in room without any s/s of distress.  Will continue to monitor throughout shift as ordered for any changes in behaviors and for continued safety.

## 2021-11-10 NOTE — ED Notes (Signed)
Report received from [place], Conservation officer, nature. Patient alert and oriented, warm and dry, in no acute distress and annoyed. Patient denies SI, HI, AVH and pain. Patient made aware of Q15 minute rounds and security cameras for their safety. Patient instructed to come to this nurse with needs or concerns.

## 2021-11-10 NOTE — ED Provider Notes (Signed)
Emergency Medicine Observation Re-evaluation Note  Ryan Zuniga is a 41 y.o. male, seen on rounds today.  Pt initially presented to the ED for complaints of Suicidal and Homicidal   Physical Exam  BP (!) 135/100 (BP Location: Left Arm)   Pulse 80   Temp (!) 97.5 F (36.4 C) (Oral)   Resp 14   Ht '5\' 8"'$  (1.727 m)   Wt 68 kg   SpO2 97%   BMI 22.81 kg/m  Physical Exam   ED Course / MDM  EKG:   I have reviewed the labs performed to date as well as medications administered while in observation.  Recent changes in the last 24 hours include none.  Plan  Current plan is for SW  Ryan Zuniga is not under involuntary commitment.     Ryan Lot, MD 11/10/21 [phone]

## 2021-11-10 NOTE — ED Notes (Signed)
Pt is refusing vitals, refusing to talk to staff and demands staff not come in his room. Pt had become agitated and reacted with prior shift. Will continue to monitor pt and allow for self therapeutic time in room.

## 2021-11-10 NOTE — ED Notes (Signed)
Pt's father on unit in dayroom visiting with patient

## 2021-11-10 NOTE — ED Notes (Signed)
VOL/pending placement 

## 2021-11-10 NOTE — ED Notes (Signed)
Pt asking for phone. When told by this tech that the phone is already being used, pt became very verbally aggressive and vulgar. Pt back in room at this time. Will continue to monitor.

## 2021-11-10 NOTE — ED Notes (Signed)
Pt became enraged when staff had to remove dirty cups from pt room.  Levert then kicked a cup of water at staff soaking their leg and shoe.  All cups were removed from room.  Pt was informed that he will no longer be able to keep any cups in his room.  Staff to be educated on removing cups after each meal and/or snack

## 2021-11-10 NOTE — ED Notes (Signed)
Pt has 10-15 dirty styrofoam cups in room.  Staff attempted to verbally coerce Ryan Zuniga to remove cups from room.  Pt became irate yelling and cursing at staff.   Staff will allow time for patient to calm self then reattempt to remove dirty cups from patient room

## 2021-11-10 NOTE — ED Notes (Signed)
Staff x2 plus security x2 removed 19 dirty cups from patient room.  Pt kicked two cups of water onto staff.

## 2021-11-11 DIAGNOSIS — F250 Schizoaffective disorder, bipolar type: Secondary | ICD-10-CM | POA: Diagnosis not present

## 2021-11-11 NOTE — ED Notes (Signed)
Pt given snack. 

## 2021-11-11 NOTE — ED Notes (Signed)
Patient is calm and cooperative, will continue to monitor, Patient is safe, camera surveillance in progress for safety.

## 2021-11-11 NOTE — ED Notes (Signed)
Patent took shower for cup of decaf coffee, no signs of distress. Nurse will continue to monitor, He denies Si/hi or avh.

## 2021-11-11 NOTE — ED Provider Notes (Signed)
Emergency Medicine Observation Re-evaluation Note  Ryan Zuniga is a 41 y.o. male, seen on rounds today.  Pt initially presented to the ED for complaints of Suicidal and Homicidal  Currently, the patient is calm, no acute complaints.  Physical Exam  Blood pressure (!) 140/96, pulse 70, temperature 98.2 F (36.8 C), temperature source Oral, resp. rate 18, height '5\' 8"'$  (1.727 m), weight 68 kg, SpO2 98 %. Physical Exam General: NAD Lungs: CTAB Psych: not agitated  ED Course / MDM  EKG:    I have reviewed the labs performed to date as well as medications administered while in observation.  Recent changes in the last 24 hours include no acute events overnight.    Plan  Current plan is for SW placement. Patient is not under full IVC at this time.   Carrie Mew, MD 11/11/21 1125

## 2021-11-11 NOTE — ED Notes (Signed)
VOL placement pending  last note on chart 7/24

## 2021-11-11 NOTE — ED Notes (Signed)

## 2021-11-11 NOTE — ED Notes (Signed)
Patient took morning medications without issues, breakfast served, no signs of distress, no behavioral issues, even said that he was sorry for how he acted yesterday, nurse thanked him, will continue to monitor.

## 2021-11-11 NOTE — ED Notes (Signed)
Vol /pending placement 

## 2021-11-12 DIAGNOSIS — F250 Schizoaffective disorder, bipolar type: Secondary | ICD-10-CM | POA: Diagnosis not present

## 2021-11-12 NOTE — ED Notes (Signed)
VOL  PENDING  PLACEMENT 

## 2021-11-12 NOTE — ED Notes (Signed)
Pt given decaf coffee in exchange for multiple cups in rm.

## 2021-11-12 NOTE — ED Notes (Signed)
Pt given snack tray and drink

## 2021-11-12 NOTE — ED Notes (Signed)
Given lunch tray and drink. Pt provided this RN with an empty cup in exchange for drink

## 2021-11-12 NOTE — ED Notes (Signed)
Pt given dinner tray and drink at this time. 

## 2021-11-13 DIAGNOSIS — F250 Schizoaffective disorder, bipolar type: Secondary | ICD-10-CM | POA: Diagnosis not present

## 2021-11-13 NOTE — ED Notes (Signed)
Patient is calm and cooperative today, He denies SI/hi or avh, no behavioral issues at this time, will continue to monitor, Patient's room is clean, He is giving back cups to put in the garbage. Staff will continue to monitor.

## 2021-11-13 NOTE — ED Notes (Signed)
Patient refused snack and beverage.

## 2021-11-13 NOTE — ED Notes (Signed)
Pt given snack. 

## 2021-11-13 NOTE — ED Notes (Addendum)
Dinner and beverage provided 

## 2021-11-13 NOTE — ED Provider Notes (Signed)
Emergency Medicine Observation Re-evaluation Note  Ryan Zuniga is a 41 y.o. male, seen on rounds today.  Pt initially presented to the ED for complaints of Suicidal and Homicidal   Physical Exam  BP (!) 121/93 (BP Location: Left Arm)   Pulse 77   Temp 98.1 F (36.7 C) (Oral)   Resp 18   Ht '5\' 8"'$  (1.727 m)   Wt 68 kg   SpO2 98%   BMI 22.81 kg/m  Physical Exam  Psych: calm  ED Course / MDM  EKG:   I have reviewed the labs performed to date as well as medications administered while in observation.  Recent changes in the last 24 hours include none.  Plan  Current plan is for social work dispo.  Gunter Conde is not under involuntary commitment.     Lucrezia Starch, MD 11/13/21 1723

## 2021-11-13 NOTE — ED Notes (Signed)
Snack and beverage given. 

## 2021-11-13 NOTE — ED Notes (Signed)
Report to include Situation, Background, Assessment, and Recommendations received from [facility]. Patient alert and oriented, warm and dry, in no acute distress. Patient denies SI, HI, AVH and pain. Patient made aware of Q15 minute rounds and security cameras for their safety. Patient instructed to come to me with needs or concerns.

## 2021-11-13 NOTE — ED Notes (Signed)
VOL/pending placement 

## 2021-11-13 NOTE — ED Notes (Signed)
Vol /pending placement 

## 2021-11-13 NOTE — ED Notes (Signed)
Patient refused to get vital signs taken

## 2021-11-14 DIAGNOSIS — F250 Schizoaffective disorder, bipolar type: Secondary | ICD-10-CM | POA: Diagnosis not present

## 2021-11-14 NOTE — ED Notes (Signed)
Snack and beverage given. 

## 2021-11-14 NOTE — ED Notes (Signed)
Breakfast and beverage provided 

## 2021-11-14 NOTE — ED Notes (Addendum)
Pt approached door and asked for some water, RN asked pt to get some of those cups from his room. Pt turned around and went to his room

## 2021-11-14 NOTE — ED Notes (Signed)
RN approached pt's room and informed pt it was time for dinner, pt replied" I am good, thanks" Rn informed him there was his dinner and a cup of water, pt refused by replying "I am good thanks" RN asked if he can take his medication Ativan for his anxiety pt replied again "I am good thanks" Unable to administered medication at this time

## 2021-11-14 NOTE — ED Provider Notes (Signed)
Emergency Medicine Observation Re-evaluation Note  Ryan Zuniga is a 41 y.o. male, seen on rounds today.  Pt initially presented to the ED for complaints of Suicidal and Homicidal   Physical Exam  BP (!) 144/100 (BP Location: Right Arm)   Pulse 78   Temp 98 F (36.7 C) (Oral)   Resp 18   Ht '5\' 8"'$  (1.727 m)   Wt 68 kg   SpO2 99%   BMI 22.81 kg/m  Physical Exam General: nad  ED Course / MDM  EKG:   I have reviewed the labs performed to date as well as medications administered while in observation.  Recent changes in the last 24 hours include none.  Plan  Current plan is for sw.  Jevaughn Degollado is not under involuntary commitment.     [name], MD 11/14/21 0730

## 2021-11-14 NOTE — ED Notes (Signed)
Report to include Situation, Background, Assessment, and Recommendations received from [facility]. Patient alert and oriented, warm and dry, in no acute distress. Patient denies SI, HI, AVH and pain. Patient made aware of Q15 minute rounds and security cameras for their safety. Patient instructed to come to me with needs or concerns.

## 2021-11-14 NOTE — ED Notes (Signed)
Pt walking around his room, standing at this door

## 2021-11-14 NOTE — ED Notes (Signed)
Pt ambulatory to restroom with steady gait. When finished using restroom pt to nurses station asking for sprite. Pt told he would get a sprite with his breakfast and offered water instead. Pt took water with no issues.

## 2021-11-14 NOTE — ED Notes (Signed)
Lunch tray and beverage provided 

## 2021-11-14 NOTE — ED Notes (Signed)
VOL/pending placement 

## 2021-11-14 NOTE — ED Notes (Signed)
Pt walked to Corporate investment banker opened door for pt

## 2021-11-14 NOTE — ED Notes (Signed)
Pt seems to upset standing in a corner of his room. Then moved to sit on his chair surrounded by cups and trays.

## 2021-11-14 NOTE — ED Notes (Signed)
Administered snack and water. No other needs at present

## 2021-11-15 DIAGNOSIS — F250 Schizoaffective disorder, bipolar type: Secondary | ICD-10-CM | POA: Diagnosis not present

## 2021-11-15 NOTE — ED Provider Notes (Signed)
Emergency Medicine Observation Re-evaluation Note  Wahid Holley is a 41 y.o. male, seen on rounds today.  Pt initially presented to the ED for complaints of Suicidal and Homicidal   Physical Exam  BP 140/61 (BP Location: Right Arm)   Pulse 82   Temp 97.9 F (36.6 C) (Oral)   Resp 18   Ht '5\' 8"'$  (1.727 m)   Wt 68 kg   SpO2 99%   BMI 22.81 kg/m  Physical Exam Resting without distress.  He voices no concerns.  He is alert, advises no particular needs at this time.  Voice is clear.  No respiratory distress  ED Course / MDM  EKG:   I have reviewed the labs performed to date as well as medications administered while in observation.  Recent changes in the last 24 hours include none.  Plan  Current plan is for sw.  Rykker Coviello is not under involuntary commitment.    Delman Kitten, MD 11/15/21 [phone]

## 2021-11-15 NOTE — ED Notes (Signed)
Report to include Situation, Background, Assessment, and Recommendations received from [facility]. Patient alert and oriented, warm and dry, in no acute distress. Patient denies SI, HI, AVH and pain. Patient made aware of Q15 minute rounds and security cameras for their safety. Patient instructed to come to me with needs or concerns.

## 2021-11-15 NOTE — ED Notes (Signed)
Snack and beverage given. 

## 2021-11-15 NOTE — ED Notes (Signed)
Pt refusing vitals at this time. Will attempt at a later time.

## 2021-11-15 NOTE — ED Notes (Signed)
Patient is vol pending placement 

## 2021-11-15 NOTE — ED Notes (Signed)
This Rn attempted to give breakfast tray and morning medications and pt states "I'm good thanks" refusing to take either

## 2021-11-16 DIAGNOSIS — F250 Schizoaffective disorder, bipolar type: Secondary | ICD-10-CM | POA: Diagnosis not present

## 2021-11-16 NOTE — ED Notes (Signed)
Snack and beverage given. 

## 2021-11-16 NOTE — ED Notes (Signed)
Vol pending placement see note 7/24

## 2021-11-16 NOTE — ED Notes (Signed)
VOL/Pending Placement 

## 2021-11-16 NOTE — ED Notes (Signed)
Unlocked bathroom door to allow patient to shower.  Staff continuously monitored dayroom outside of bathroom door during pt shower.  Pt was given hygiene items as well as:  I clean top, 1 clean bottom, with 1 pair of disposable underwear.  Pt changed out into clean clothing.  Staff disposed of all shower supplies. Shower room cleaned and secured for next use.

## 2021-11-16 NOTE — ED Provider Notes (Signed)
Emergency Medicine Observation Re-evaluation Note  Ryan Zuniga is a 41 y.o. male, seen on rounds today.  Pt initially presented to the ED for complaints of Suicidal and Homicidal Currently, the patient is calm, resting.  Physical Exam  BP (!) 141/95 (BP Location: Right Arm)   Pulse 70   Temp 98.1 F (36.7 C) (Oral)   Resp 18   Ht '5\' 8"'$  (1.727 m)   Wt 68 kg   SpO2 96%   BMI 22.81 kg/m    ED Course / MDM  EKG:   I have reviewed the labs performed to date as well as medications administered while in observation.  Recent changes in the last 24 hours include none.  Plan  Current plan is for TTS dispo.  Ryan Zuniga is not under involuntary commitment.     Duffy Bruce, MD 11/16/21 [phone]

## 2021-11-16 NOTE — ED Notes (Signed)
Hospital meal provided, pt tolerated w/o complaints.  Waste discarded appropriately.  

## 2021-11-17 DIAGNOSIS — F250 Schizoaffective disorder, bipolar type: Secondary | ICD-10-CM | POA: Diagnosis not present

## 2021-11-17 NOTE — ED Notes (Signed)
Report received from Seth Bake, Conservation officer, nature. Patient alert and oriented, warm and dry, in no acute distress. Patient denies SI, HI, AVH and pain. Patient made aware of Q15 minute rounds and security cameras for their safety. Patient instructed to come to this nurse with needs or concerns.

## 2021-11-17 NOTE — TOC Progression Note (Signed)
Transition of Care [facility]) - Progression Note    Patient Details  Name: Ryan Zuniga MRN: [id number] Date of Birth: 18-Mar-1981  Transition of Care [facility]) CM/SW Contact  Shelbie Hutching, RN Phone Number: 11/17/2021, 11:38 AM  Clinical Narrative:    RNCM reached out to IDD Care Coordinator with [facility], [phone].  She reports that [facility] is reviewing patient.  York Cerise has also submitted referrals to [facility], [facility], and [facility].     Expected Discharge Plan: Group Home Barriers to Discharge: Other (must enter comment) (Group home will not take back)  Expected Discharge Plan and Services Expected Discharge Plan: Group Home   Discharge Planning Services: CM Consult   Living arrangements for the past 2 months: Group Home                 DME Arranged: N/A DME Agency: NA       HH Arranged: NA HH Agency: NA         Social Determinants of Health (SDOH) Interventions    Readmission Risk Interventions     No data to display

## 2021-11-17 NOTE — ED Notes (Signed)
Vol /pending placement 

## 2021-11-17 NOTE — ED Notes (Signed)
Snack and sprite given to pt

## 2021-11-17 NOTE — ED Notes (Signed)
Breakfast and beverage provided 

## 2021-11-17 NOTE — ED Notes (Signed)
Snack and beverage was offered but pt refused

## 2021-11-17 NOTE — ED Notes (Signed)
VOL/pending placement 

## 2021-11-17 NOTE — ED Provider Notes (Signed)
Emergency Medicine Observation Re-evaluation Note  Larz Mark is a 41 y.o. male, seen on rounds today.  Pt initially presented to the ED for complaints of Suicidal and Homicidal  Currently, the patient is calm, no acute complaints.  Physical Exam  Blood pressure (!) 120/93, pulse 77, temperature 97.8 F (36.6 C), temperature source Oral, resp. rate 17, height '5\' 8"'$  (1.727 m), weight 68 kg, SpO2 98 %. Physical Exam General: NAD Lungs: CTAB Psych: not agitated  ED Course / MDM  EKG:    I have reviewed the labs performed to date as well as medications administered while in observation.  Recent changes in the last 24 hours include no acute events overnight.    Plan  Current plan is for social work disposition. Patient is not under full IVC at this time.   Carrie Mew, MD 11/17/21 1114

## 2021-11-17 NOTE — ED Notes (Signed)
Lunch and beverage provided 

## 2021-11-17 NOTE — ED Notes (Signed)
Pt given snack. 

## 2021-11-18 DIAGNOSIS — F250 Schizoaffective disorder, bipolar type: Secondary | ICD-10-CM | POA: Diagnosis not present

## 2021-11-18 NOTE — ED Provider Notes (Signed)
Emergency Medicine Observation Re-evaluation Note  Ryan Zuniga is a 41 y.o. male, seen on rounds today.  Pt initially presented to the ED for complaints of Suicidal and Homicidal Currently, the patient is resting w/o complaint.  Physical Exam  BP (!) 138/98 (BP Location: Left Arm)   Pulse 81   Temp 98.2 F (36.8 C) (Oral)   Resp 17   Ht '5\' 8"'$  (1.727 m)   Wt 68 kg   SpO2 97%   BMI 22.81 kg/m  Physical Exam General: no acute distress Psych: calm  ED Course / MDM  EKG:   I have reviewed the labs performed to date as well as medications administered while in observation.  Recent changes in the last 24 hours include none.  Plan  Current plan is for social work dispo.  Ryan Zuniga is not under involuntary commitment.     Lucrezia Starch, MD 11/18/21 1141

## 2021-11-18 NOTE — ED Notes (Signed)
Pt given sprite in exchange for a cup from room

## 2021-11-18 NOTE — ED Notes (Signed)
PT refused vital signs and breakfast.

## 2021-11-18 NOTE — ED Notes (Signed)
Cups and trays removed from room. Mattress lifted and trash was under. All trash removed. Patient was cooperative and pleasant.

## 2021-11-18 NOTE — ED Notes (Signed)
Pt giving RN 4 cups. Pt has 6 in his room

## 2021-11-18 NOTE — ED Notes (Signed)
VOL  PENDING  PLACEMENT 

## 2021-11-18 NOTE — ED Notes (Signed)
Pt refused dinner tray and yelling at staff when staff requested to get his cups

## 2021-11-18 NOTE — ED Notes (Signed)
Pt given lunch tray.

## 2021-11-19 DIAGNOSIS — F250 Schizoaffective disorder, bipolar type: Secondary | ICD-10-CM | POA: Diagnosis not present

## 2021-11-19 NOTE — ED Provider Notes (Signed)
Emergency Medicine Observation Re-evaluation Note  Ryan Zuniga is a 41 y.o. male, seen on rounds today.  Pt initially presented to the ED for complaints of Suicidal and Homicidal  Currently, the patient is in no distress and per [facility] nurse no issues overnight  Physical Exam  Blood pressure 133/83, pulse 60, temperature 97.9 F (36.6 C), temperature source Oral, resp. rate 18, height '5\' 8"'$  (1.727 m), weight 68 kg, SpO2 93 %.  Physical Exam Physical Exam General: No apparent distress Pulm: Normal WOB  ED Course / MDM     I have reviewed the labs performed to date as well as medications administered while in observation.  Recent changes in the last 24 hours include none  Plan   Current plan is to continue to wait for placement  Patient is not under full IVC at this time.   Vanessa [place], MD 11/19/21 [phone]

## 2021-11-19 NOTE — TOC Progression Note (Signed)
Transition of Care [facility]) - Progression Note    Patient Details  Name: Ryan Zuniga MRN: [id number] Date of Birth: 1980/12/28  Transition of Care [facility]) CM/SW Contact  Shelbie Hutching, RN Phone Number: 11/19/2021, 12:55 PM  Clinical Narrative:    [facility] responded and will not accept patient they cannot meet his needs.  [facility] still waiting to hear back from [place].  They have reached back out to [employer] regarding their ICF vacancies.  York Cerise is awaiting consent to release information to [employer], they indicated they have ICF vacancies.  Expected Discharge Plan: Group Home Barriers to Discharge: Other (must enter comment) (Group home will not take back)  Expected Discharge Plan and Services Expected Discharge Plan: Group Home   Discharge Planning Services: CM Consult   Living arrangements for the past 2 months: Group Home                 DME Arranged: N/A DME Agency: NA       HH Arranged: NA HH Agency: NA         Social Determinants of Health (SDOH) Interventions    Readmission Risk Interventions     No data to display

## 2021-11-19 NOTE — ED Notes (Signed)
VOL/pending placement 

## 2021-11-19 NOTE — ED Notes (Signed)
Patient refused vitals.

## 2021-11-19 NOTE — ED Notes (Signed)
Vol /pending placement 

## 2021-11-19 NOTE — ED Notes (Signed)
Patient did not want His lunch, only His cookie, He is being calm and cooperative, nurse will continue to monitor. Patient only has 2 cups at bedside, no behavioral issues noted.

## 2021-11-19 NOTE — ED Notes (Signed)
Report received from Abigail Butts, Conservation officer, nature. Patient alert and oriented, warm and dry, in no acute distress. Patient requests not to talk with staff at this time. Patient made aware of Q15 minute rounds and security cameras for their safety. Patient instructed to come to this nurse with needs or concerns.

## 2021-11-19 NOTE — ED Notes (Signed)
Hospital meal provided, pt tolerated w/o complaints.  Waste discarded appropriately.  

## 2021-11-19 NOTE — ED Notes (Signed)
Patient ate 50% of supper and beverage, no signs of distress, will continue to monitor.

## 2021-11-19 NOTE — ED Notes (Signed)
Patient is calm and cooperative, He did eat His breakfast and have beverage, and one cup of decaf coffee that was cooled down, Patient without any behavioral issues this morning, denies Si/hi or avh and is safe, camera surveillance in progress and q 15 minute checks.

## 2021-11-20 DIAGNOSIS — F250 Schizoaffective disorder, bipolar type: Secondary | ICD-10-CM | POA: Diagnosis not present

## 2021-11-20 NOTE — ED Notes (Signed)
Pt given snack. 

## 2021-11-20 NOTE — ED Notes (Signed)
Report to include Situation, Background, Assessment, and Recommendations received from [facility]. Patient alert and oriented, warm and dry, in no acute distress. Patient denies SI, HI, AVH and pain. Patient made aware of Q15 minute rounds and security cameras for their safety. Patient instructed to come to me with needs or concerns.

## 2021-11-20 NOTE — ED Provider Notes (Signed)
Emergency Medicine Observation Re-evaluation Note  Garion Wempe is a 41 y.o. male, seen on rounds today.  Pt initially presented to the ED for complaints of Suicidal and Homicidal  Currently, the patient is awake- walking around room no issues per [facility] nurse pt was given PRN ativan for some anxiety   Physical Exam  Blood pressure (!) 134/95, pulse 88, temperature 98.8 F (37.1 C), temperature source Oral, resp. rate 18, height '5\' 8"'$  (1.727 m), weight 68 kg, SpO2 92 %.  Physical Exam General: No apparent distress Pulm: Normal WOB      ED Course / MDM     I have reviewed the labs performed to date as well as medications administered while in observation.  Recent changes in the last 24 hours include none  Plan   Current plan is to continue to wait for placement  Patient is not under full IVC at this time.   Vanessa Chauncey, MD 11/20/21 [phone]

## 2021-11-20 NOTE — ED Notes (Signed)
Pt alert, calm, cooperative.

## 2021-11-20 NOTE — ED Notes (Signed)
Pt given dinner tray and drink at this time. 

## 2021-11-20 NOTE — ED Notes (Signed)
Pt refused vitals at this time. Will attempt again at later time.

## 2021-11-20 NOTE — ED Notes (Signed)
Pt knocking on door asking for coffee. Informed pt we do not even have breakfast trays yet. Offered water, pt declined. Pt walking in common area while other pts continue to sleep.

## 2021-11-20 NOTE — TOC Progression Note (Signed)
Transition of Care [facility]) - Progression Note    Patient Details  Name: Juleon Narang MRN: [id number] Date of Birth: 08-Jun-1980  Transition of [facility]) CM/SW Contact  Shelbie Hutching, RN Phone Number: 11/20/2021, 11:45 AM  Clinical Narrative:     Patient's psychological assessment has been completed, guardian has sent to IDD coordinator.    Expected Discharge Plan: Group Home Barriers to Discharge: Other (must enter comment) (Group home will not take back)  Expected Discharge Plan and Services Expected Discharge Plan: Group Home   Discharge Planning Services: CM Consult   Living arrangements for the past 2 months: Group Home                 DME Arranged: N/A DME Agency: NA       HH Arranged: NA HH Agency: NA         Social Determinants of Health (SDOH) Interventions    Readmission Risk Interventions     No data to display

## 2021-11-20 NOTE — ED Notes (Signed)
Vol /pending placement 

## 2021-11-20 NOTE — ED Notes (Signed)
Snack and beverage given. 

## 2021-11-21 DIAGNOSIS — F250 Schizoaffective disorder, bipolar type: Secondary | ICD-10-CM | POA: Diagnosis not present

## 2021-11-21 NOTE — ED Notes (Signed)
Hospital meal provided, pt tolerated w/o complaints.  Waste discarded appropriately.  

## 2021-11-21 NOTE — ED Provider Notes (Signed)
Emergency Medicine Observation Re-evaluation Note  Ryan Zuniga is a 41 y.o. male, seen on rounds today.  Pt initially presented to the ED for complaints of Suicidal and Homicidal   Physical Exam  BP (!) 143/99 (BP Location: Left Arm)   Pulse 88   Temp 97.7 F (36.5 C) (Oral)   Resp 16   Ht '5\' 8"'$  (1.727 m)   Wt 68 kg   SpO2 97%   BMI 22.81 kg/m  Physical Exam General: NAD ED Course / MDM  EKG:   I have reviewed the labs performed to date as well as medications administered while in observation.  Recent changes in the last 24 hours include none.  Plan  Current plan is for SW.  Ryan Zuniga is not under involuntary commitment.     Ryan Lot, MD 11/21/21 [phone]

## 2021-11-21 NOTE — ED Notes (Signed)
Patient was offered lunch and refused.

## 2021-11-21 NOTE — ED Notes (Signed)
VOL pending placement most recent note on clipboard

## 2021-11-21 NOTE — ED Notes (Signed)
This tech brought dinner tray to Pt, Pt states "I don't want that, give it to somebody else". This tech informed Pt that he hasn't eaten breakfast or lunch, Pt states "I'm not hungry". Pt accepted potato chips and water.

## 2021-11-21 NOTE — ED Notes (Signed)
Report to include Situation, Background, Assessment, and Recommendations received from [facility]. Patient alert and oriented, warm and dry, in no acute distress. Patient denies SI, HI, AVH and pain. Patient made aware of Q15 minute rounds and security cameras for their safety. Patient instructed to come to me with needs or concerns.

## 2021-11-21 NOTE — ED Notes (Signed)
pt recieved snack and drink 

## 2021-11-22 DIAGNOSIS — F250 Schizoaffective disorder, bipolar type: Secondary | ICD-10-CM | POA: Diagnosis not present

## 2021-11-22 NOTE — ED Notes (Signed)
PT given lunch tray and beverage

## 2021-11-22 NOTE — ED Notes (Signed)
PT refused dinner tray

## 2021-11-22 NOTE — ED Provider Notes (Signed)
Emergency Medicine Observation Re-evaluation Note  Ryan Zuniga is a 41 y.o. male, seen on rounds today.  Pt initially presented to the ED for complaints of Suicidal and Homicidal  Currently, the patient is asleep in bed- no issues per [facility] nurse  Physical Exam  Blood pressure (!) 127/90, pulse 68, temperature 97.8 F (36.6 C), temperature source Oral, resp. rate 16, height '5\' 8"'$  (1.727 m), weight 68 kg, SpO2 98 %.  Physical Exam General: No apparent distress Pulm: Normal WOB Psych: resting     ED Course / MDM     I have reviewed the labs performed to date as well as medications administered while in observation.  Recent changes in the last 24 hours include none  Plan   Current plan is to continue to wait for placement Patient is not under full IVC at this time.   Vanessa Wykoff, MD 11/22/21 [phone]

## 2021-11-22 NOTE — ED Notes (Signed)
Report to include Situation, Background, Assessment, and Recommendations received from [facility]. Patient alert and oriented, warm and dry, in no acute distress. Patient denies SI, HI, AVH and pain. Patient made aware of Q15 minute rounds and security cameras for their safety. Patient instructed to come to me with needs or concerns.

## 2021-11-22 NOTE — ED Notes (Signed)
PT offered breakfast tray twice and refused both times. Will try one more time.

## 2021-11-22 NOTE — ED Notes (Signed)
VOL/pending placement 

## 2021-11-22 NOTE — ED Notes (Signed)
Patient is vol pending placement 

## 2021-11-23 DIAGNOSIS — F250 Schizoaffective disorder, bipolar type: Secondary | ICD-10-CM | POA: Diagnosis not present

## 2021-11-23 NOTE — ED Notes (Signed)
No snack provided due to pt behaviors and uncooperativeness

## 2021-11-23 NOTE — ED Notes (Signed)

## 2021-11-23 NOTE — ED Notes (Signed)
Staff noticed 24 cups on the floor in pt room. Pt reminded he can not have that many cups and asked to remove some when his breakfast and coffee arrive. Pt agreed.

## 2021-11-23 NOTE — ED Notes (Signed)
Patient became upset for checking and cleaning room. Patient cursing at staff.  Patient made aware of rules of trays and trash in room.  Due to behavior patient's TV turned off and remote taken.

## 2021-11-23 NOTE — ED Notes (Signed)
Dinner tray and beverage provided 

## 2021-11-23 NOTE — ED Notes (Signed)
Pt asked what kind of beverage he wanted with is medications. Pt states he wants coffee. Pt told if he throws away some of his cups and takes his medications he can have a cup of coffee. Pt agreed and gave this nurse 12 of his cups. Pt alert and calm at this time.

## 2021-11-23 NOTE — ED Notes (Signed)
Pt given snack and coffee; sitting in dayroom with other patients and ED tech

## 2021-11-23 NOTE — ED Notes (Signed)
Pt is refusing vitals and to speak with staff, yelling "fuck you" "get the fuck out"

## 2021-11-23 NOTE — ED Provider Notes (Signed)
Emergency Medicine Observation Re-evaluation Note  Ryan Zuniga is a 41 y.o. male, seen on rounds today.  Pt initially presented to the ED for complaints of Suicidal and Homicidal   Physical Exam  BP (!) 140/90 (BP Location: Left Arm)   Pulse 76   Temp 98.6 F (37 C) (Oral)   Resp 18   Ht '5\' 8"'$  (1.727 m)   Wt 68 kg   SpO2 94%   BMI 22.81 kg/m  Physical Exam General: NAD   ED Course / MDM  EKG:   I have reviewed the labs performed to date as well as medications administered while in observation.  Recent changes in the last 24 hours include, none.  Plan  Current plan is for TOC.  Ryan Zuniga is not under involuntary commitment.     [name], MD 11/23/21 [phone]

## 2021-11-23 NOTE — ED Notes (Signed)
VOL  PENDING  PLACEMENT 

## 2021-11-23 NOTE — ED Notes (Signed)
Hospital meal provided, pt tolerated w/o complaints.  Waste discarded appropriately.  

## 2021-11-23 NOTE — ED Notes (Addendum)
Pt offered breakfast and milk. Pt refused his breakfast and beverage and also refuses to remove any of the cups on the floor in his room.

## 2021-11-24 DIAGNOSIS — F250 Schizoaffective disorder, bipolar type: Secondary | ICD-10-CM | POA: Diagnosis not present

## 2021-11-24 NOTE — ED Notes (Signed)
Pt given dinner tray and drink 

## 2021-11-24 NOTE — ED Notes (Signed)
Breakfast and beverage provided 

## 2021-11-24 NOTE — ED Notes (Signed)
VOL/Pending Placement 

## 2021-11-24 NOTE — ED Provider Notes (Signed)
Emergency Medicine Observation Re-evaluation Note  Ryan Zuniga is a 41 y.o. male, seen in the emergency department for psychiatric concern.  Patient has been cleared by psychiatry from their standpoint currently awaiting placement to a appropriate group facility.  No acute events since last update  Physical Exam  BP (!) 131/92   Pulse 71   Temp 97.7 F (36.5 C) (Oral)   Resp 16   Ht '5\' 8"'$  (1.727 m)   Wt 68 kg   SpO2 100%   BMI 22.81 kg/m    ED Course / MDM   No recent lab work for review Plan  Current plan is for placement to an appropriate group facility once available.  Ryan Zuniga is not under involuntary commitment.     Harvest Dark, MD 11/24/21 1444

## 2021-11-24 NOTE — ED Notes (Signed)
Report received from Seth Bake, Conservation officer, nature. Patient alert and oriented, warm and dry, in no acute distress. Patient denies SI, HI, AVH and pain. Patient made aware of Q15 minute rounds and security cameras for their safety. Patient instructed to come to this nurse with needs or concerns.

## 2021-11-24 NOTE — ED Notes (Signed)
Pt given snack. 

## 2021-11-24 NOTE — ED Notes (Signed)
Pt up to restroom.

## 2021-11-24 NOTE — ED Notes (Signed)
VOL/pending placement 

## 2021-11-25 DIAGNOSIS — F250 Schizoaffective disorder, bipolar type: Secondary | ICD-10-CM | POA: Diagnosis not present

## 2021-11-25 NOTE — ED Notes (Signed)
Patient ask for sprite, Patient is calm and cooperative.

## 2021-11-25 NOTE — ED Notes (Signed)
pt recieved snack and drink 

## 2021-11-25 NOTE — ED Notes (Signed)
Patient is awake, asking for coffee, nurse made agreement with him to take shower and give up any empty cups that were in His room and He did agree, Patient is calm and cooperative, no behavioral issues noted, will continue to monitor.

## 2021-11-25 NOTE — ED Notes (Signed)
Pt had refused dinner tray

## 2021-11-25 NOTE — ED Notes (Signed)
Patient is pleasant, no signs of distress, will continue to monitor.

## 2021-11-25 NOTE — ED Notes (Signed)
Per pt request, pt received water in exchange for one emptied cup

## 2021-11-25 NOTE — ED Notes (Signed)
Patient just got out of shower, no signs of distress, will continue to monitor, Patient also brought up dirty sheets, and pillow case, and ask for clean linen, He is changing His bed, Patient is pleasant and cooperative.

## 2021-11-25 NOTE — ED Provider Notes (Signed)
Emergency Medicine Observation Re-evaluation Note  Ryan Zuniga is a 41 y.o. male, seen on rounds today.  Pt initially presented to the ED for complaints of Suicidal and Homicidal   Currently, the patient is sleeping- discussed with night [facility] nurse and day time [place] for rounds this AM and no concerns over night.  Physical Exam  Blood pressure (!) 151/89, pulse 79, temperature 97.8 F (36.6 C), temperature source Oral, resp. rate 16, height '5\' 8"'$  (1.727 m), weight 68 kg, SpO2 98 %.  Physical Exam General: No apparent distress Pulm: Normal WOB Psych: resting     ED Course / MDM     I have reviewed the labs performed to date as well as medications administered while in observation.   Plan   Current plan is to continue to wait for placement Patient is not under full IVC at this time.   Vanessa [employer], MD 11/25/21 [phone]

## 2021-11-25 NOTE — ED Notes (Signed)
Patient is watching tv, no signs of distress, He is calm and cooperative. Denies Si/hi or avh, will continue to monitor, q 15 minute checks and camera surveillance in progress for safety.

## 2021-11-26 DIAGNOSIS — F250 Schizoaffective disorder, bipolar type: Secondary | ICD-10-CM | POA: Diagnosis not present

## 2021-11-26 NOTE — ED Notes (Signed)
VOL/Pending Placement 

## 2021-11-26 NOTE — ED Notes (Signed)
Patient refused vitals and breakfast

## 2021-11-26 NOTE — ED Notes (Signed)
Pt up to bathroom.

## 2021-11-26 NOTE — ED Notes (Signed)
pt recieved snack and drink 

## 2021-11-26 NOTE — TOC Progression Note (Signed)
Transition of Care [facility]) - Progression Note    Patient Details  Name: Ryan Zuniga MRN: [id number] Date of Birth: 1981/02/27  Transition of Care [facility]) CM/SW Contact  Shelbie Hutching, RN Phone Number: 11/26/2021, 10:31 AM  Clinical Narrative:    Kellie Moor, BSW, QP  [phone] With [place] coordinator has been sending referrals to potential providers, she requested some notes to reflect patient's current behaviors.  RNCM secure emailed Jaimee with progress and nursing notes from the past few days, patient has been calm and cooperative.      Expected Discharge Plan: Group Home Barriers to Discharge: Other (must enter comment) (Group home will not take back)  Expected Discharge Plan and Services Expected Discharge Plan: Group Home   Discharge Planning Services: CM Consult   Living arrangements for the past 2 months: Group Home                 DME Arranged: N/A DME Agency: NA       HH Arranged: NA HH Agency: NA         Social Determinants of Health (SDOH) Interventions    Readmission Risk Interventions     No data to display

## 2021-11-26 NOTE — ED Notes (Signed)
Went to pt room with Ryan Zuniga and Cam, security- asked pt to bring out cups and pt refused- this RN and Ryan Zuniga, security went into room to collect all cups filled with water- pt upset and cursing

## 2021-11-26 NOTE — ED Provider Notes (Signed)
Emergency Medicine Observation Re-evaluation Note  Quinlan Vollmer is a 41 y.o. male, seen on rounds today.   Physical Exam  BP (!) 145/108   Pulse 79   Temp 98.2 F (36.8 C)   Resp 14   Ht '5\' 8"'$  (1.727 m)   Wt 68 kg   SpO2 96%   BMI 22.81 kg/m  Physical Exam General: Patient in no distress and comfortable Lungs: Patient not in respiratory distress Psych: Patient not combative  ED Course / MDM  EKG:     Plan  Current plan is for wait for social work placement.  Selah Zelman is not under involuntary commitment.     Nena Polio, MD 11/26/21 1214

## 2021-11-26 NOTE — ED Notes (Signed)
Pt given breakfast.

## 2021-11-26 NOTE — ED Notes (Signed)
Patient agreed to vitals and received a cup of coffee.

## 2021-11-26 NOTE — ED Notes (Signed)
Vol /pending placement 

## 2021-11-27 DIAGNOSIS — F250 Schizoaffective disorder, bipolar type: Secondary | ICD-10-CM | POA: Diagnosis not present

## 2021-11-27 NOTE — ED Notes (Signed)
VOL pending placement note on clipboard

## 2021-11-27 NOTE — ED Notes (Signed)
Pt has multiple dirty cups present on pt floor, EDT Shawn and Security removed cups from pt room. Pt increasingly agitated and pt yelling at staff. Pt reminded trash and cleanup rules.

## 2021-11-27 NOTE — ED Provider Notes (Signed)
Emergency Medicine Observation Re-evaluation Note  Ryan Zuniga is a 41 y.o. male, seen on rounds today.   Physical Exam  BP 125/89   Pulse 80   Temp 98.1 F (36.7 C) (Oral)   Resp 16   Ht '5\' 8"'$  (1.727 m)   Wt 68 kg   SpO2 96%   BMI 22.81 kg/m  Physical Exam General: Patient resting comfortably in bed Lungs: Patient not in respiratory distress Psych: Patient not combative  ED Course / MDM  EKG:     Plan  Current plan is for placement by social work.  Ryan Zuniga is not under involuntary commitment.     Nena Polio, MD 11/27/21 [phone]

## 2021-11-27 NOTE — ED Notes (Signed)
VOL / pending TOC placement 

## 2021-11-27 NOTE — ED Notes (Signed)
Pt given nighttime snack. 

## 2021-11-27 NOTE — ED Notes (Signed)
Report to include Situation, Background, Assessment, and Recommendations received from [facility]. Patient alert and oriented, warm and dry, in no acute distress. Patient denies SI, HI, AVH and pain. Patient made aware of Q15 minute rounds and security cameras for their safety. Patient instructed to come to me with needs or concerns.

## 2021-11-27 NOTE — ED Notes (Signed)
Attempted to give pt dinner tray, pt refused dinner tray however requested drink. Pt given water.

## 2021-11-28 DIAGNOSIS — F250 Schizoaffective disorder, bipolar type: Secondary | ICD-10-CM | POA: Diagnosis not present

## 2021-11-28 NOTE — ED Notes (Signed)
Hospital meal provided, pt tolerated w/o complaints.  Waste discarded appropriately.  

## 2021-11-28 NOTE — ED Notes (Signed)
Pt refused vitals and food tray at this time.

## 2021-11-28 NOTE — ED Notes (Signed)
Vol /pending placement 

## 2021-11-28 NOTE — ED Notes (Signed)
Report to include Situation, Background, Assessment, and Recommendations received from [place] RN. Patient alert and oriented, warm and dry, in no acute distress. Patient denies SI, HI, AVH and pain. Patient made aware of Q15 minute rounds and security cameras for their safety. Patient instructed to come to me with needs or concerns.

## 2021-11-28 NOTE — ED Notes (Signed)
Pt offered phone once available as Ryan Zuniga had asked if he might want to give him a call later. Pt declined offer to use phone to talk with his father.

## 2021-11-28 NOTE — ED Notes (Signed)
Pt's father Fritz Pickerel called this RN requesting an update. Pt gave this RN verbal permission to update his father. Fritz Pickerel briefly updated.

## 2021-11-28 NOTE — ED Notes (Signed)
Lunch tray given. 

## 2021-11-28 NOTE — ED Notes (Signed)
Dinner tray given to pt

## 2021-11-29 DIAGNOSIS — F250 Schizoaffective disorder, bipolar type: Secondary | ICD-10-CM | POA: Diagnosis not present

## 2021-11-29 NOTE — ED Notes (Signed)
Patient is vol pending placement 

## 2021-11-29 NOTE — ED Notes (Signed)
Pt awake, alert, calm. Camera in use monitored by security.

## 2021-11-29 NOTE — ED Notes (Signed)
Pt awake, calm, cooperative. Pt given dinner tray. Camera in use monitored by security.

## 2021-11-29 NOTE — ED Provider Notes (Signed)
Emergency Medicine Observation Re-evaluation Note  Ryan Zuniga is a 41 y.o. male, seen on rounds today.  Pt initially presented to the ED for complaints of Suicidal and Homicidal   Physical Exam  BP 112/66 (BP Location: Left Arm)   Pulse 70   Temp 98.7 F (37.1 C) (Oral)   Resp 18   Ht '5\' 8"'$  (1.727 m)   Wt 68 kg   SpO2 92%   BMI 22.81 kg/m  Physical Exam General: Resting comfortably Lungs: No increased work of breathing Psych: No agitation,  ED Course / MDM  EKG:   I have reviewed the labs performed to date as well as medications administered while in observation.  Recent changes in the last 24 hours include no changes.  Plan  Current plan is for social work disposition.  Ryan Zuniga is not under involuntary commitment.     Rada Hay, MD 11/29/21 [phone]

## 2021-11-29 NOTE — ED Notes (Signed)
Pt up, calm, request and given drink.

## 2021-11-29 NOTE — ED Notes (Signed)
Pt calm, cooperative, resting in bed. NAD. Camera in use monitored by security.

## 2021-11-29 NOTE — ED Notes (Signed)
VOL  PENDING  PLACEMENT 

## 2021-11-29 NOTE — ED Notes (Signed)
Pt awake, alert, calm. Asked for and given drink. Camera in use monitored by security.

## 2021-11-29 NOTE — ED Notes (Signed)
Pt awake, alert, calm. Lunch tray given. Camera in use monitored by security.

## 2021-11-29 NOTE — ED Notes (Signed)
Report to include Situation, Background, Assessment, and Recommendations received from [facility]. Patient alert and oriented, warm and dry, in no acute distress. Patient denies SI, HI, AVH and pain. Patient made aware of Q15 minute rounds and security cameras for their safety. Patient instructed to come to me with needs or concerns.

## 2021-11-29 NOTE — ED Notes (Signed)
Pt resting in bed. NAD. Camera in use monitored by security.

## 2021-11-29 NOTE — ED Notes (Signed)
Pt resting in bed. Camera in use monitored by security.

## 2021-11-29 NOTE — ED Notes (Signed)
Pt in room, calm, cooperative. NAD. Camera in use monitored by security.

## 2021-11-29 NOTE — ED Notes (Signed)
Snack and beverage given. 

## 2021-11-29 NOTE — ED Notes (Addendum)
Pt awake, alert, calm. Asked for and given drink. Camera in use monitored by security.

## 2021-11-30 DIAGNOSIS — F250 Schizoaffective disorder, bipolar type: Secondary | ICD-10-CM | POA: Diagnosis not present

## 2021-11-30 NOTE — TOC Progression Note (Signed)
Transition of Care [facility]) - Progression Note    Patient Details  Name: Ryan Zuniga MRN: [id number] Date of Birth: [birth year]  Transition of Care [facility]) CM/SW [place], [place] Phone Number: 11/30/2021, 10:32 AM  Clinical Narrative:     CSW has reached out to [employer] with [facility] at [phone]. Jamie's vm reports she is out of office until 8/21.   CSW reached out to IDD Care Coordinator Supervisor with [facility] at [phone], no answer lvm requesting updates on placement.   CSW reached out ot IDD Program Director with [facility] at [phone], no answer lvm requesting updates on placement.    Expected Discharge Plan: Group Home Barriers to Discharge: Other (must enter comment) (Group home will not take back)  Expected Discharge Plan and Services Expected Discharge Plan: Group Home   Discharge Planning Services: CM Consult   Living arrangements for the past 2 months: Group Home                 DME Arranged: N/A DME Agency: NA       HH Arranged: NA HH Agency: NA         Social Determinants of Health (SDOH) Interventions    Readmission Risk Interventions     No data to display

## 2021-11-30 NOTE — ED Notes (Signed)
Report received from Seth Bake, Conservation officer, nature. Patient alert and oriented, warm and dry, in no acute distress. Patient denies SI, HI, AVH and pain. Patient made aware of Q15 minute rounds and security cameras for their safety. Patient instructed to come to this nurse with needs or concerns.

## 2021-11-30 NOTE — ED Notes (Signed)
Breakfast and beverage provided 

## 2021-11-30 NOTE — ED Notes (Signed)
VOL  PENDING  PLACEMENT 

## 2021-11-30 NOTE — ED Notes (Signed)
Pt given nighttime snack. 

## 2021-11-30 NOTE — ED Provider Notes (Signed)
Emergency Medicine Observation Re-evaluation Note  Ryan Zuniga is a 41 y.o. male, seen on rounds today.  Pt initially presented to the ED for complaints of Suicidal and Homicidal  Currently, the patient is calm, no acute complaints.  Physical Exam  Blood pressure (!) 127/94, pulse 77, temperature 98.1 F (36.7 C), temperature source Oral, resp. rate 18, height '5\' 8"'$  (1.727 m), weight 68 kg, SpO2 95 %. Physical Exam General: NAD Lungs: CTAB Psych: not agitated  ED Course / MDM  EKG:    I have reviewed the labs performed to date as well as medications administered while in observation.  Recent changes in the last 24 hours include no acute events overnight.    Plan  Current plan is for social work disposition. Patient is not under full IVC at this time.   Carrie Mew, MD 11/30/21 [phone]

## 2021-11-30 NOTE — ED Notes (Signed)
Lunch tray and beverage provided 

## 2021-11-30 NOTE — ED Notes (Signed)
Pt up to restroom and seen leaving restroom with two cups full of water.

## 2021-11-30 NOTE — ED Notes (Signed)
Dinner tray provided

## 2021-11-30 NOTE — ED Notes (Signed)
Snack and bevreage provided

## 2021-11-30 NOTE — ED Notes (Signed)
Pt up to nurses station asking for cup of coffee. Pt told he can have one with his breakfast. Pt asked for two and pt informed he would only be allowed to have one cup. Pt asked for sprite while he waited for his breakfast and pt told he could have a cup of water since it wasn't meal time. Pt verbalized understanding and given a cup of water.

## 2021-12-01 DIAGNOSIS — F250 Schizoaffective disorder, bipolar type: Secondary | ICD-10-CM | POA: Diagnosis not present

## 2021-12-01 NOTE — ED Notes (Signed)
Shower was offered and refused by patient.

## 2021-12-01 NOTE — ED Notes (Addendum)
Pt up to restroom and asked this nurse for cup of sprite. Pt provided a cup of water and reminded he can have sprite or coffee with breakfast.

## 2021-12-01 NOTE — ED Notes (Signed)
Staff member from [employer] in [place] to evaluate [place] for possible placement. Pt cooperative and calm at this time. Interview appeared to go well and staff member said he hoped to help find placement for Pt.

## 2021-12-01 NOTE — ED Notes (Addendum)
Hospital meal provided, pt refused breakfast tray. Reported to [employer].

## 2021-12-01 NOTE — ED Notes (Signed)
Trash removed from room.  Patient refused vitals.

## 2021-12-01 NOTE — ED Notes (Signed)
VOL/Pending Placement 

## 2021-12-01 NOTE — ED Notes (Signed)
Snack provided

## 2021-12-01 NOTE — ED Notes (Signed)
Lunch tray and beverage provided 

## 2021-12-01 NOTE — ED Notes (Signed)
Pt offered coffee and bagel. Pt accepted and thanked this nurse. Pt states he is sorry for not picking up his cups yesterday and getting angry and states he was just trying to make something with his cups. Pt told that he just needs to clean up his cups and trash before the end of the shift so we can keep things clean and keep away bugs. Verbalized understanding.

## 2021-12-01 NOTE — ED Notes (Signed)
Vol /pending placement 

## 2021-12-01 NOTE — ED Notes (Addendum)
Pt up to nurses station; asking to use restroom and for a drink. Pt let into restroom. Pt informed he can have a drink and was asked to throw away some of his garbage out of his room. Pt agreed but when he brought only one cup of the 7 in his room pt was encouraged to throw away more cups. Pt quickly became agitated and said "never mind" and quickly stomped down the hallway yelling profanities at this nurse and ED tech.  Pt seen putting up his middle finger at the camera in his room multiple times.

## 2021-12-01 NOTE — ED Notes (Signed)
Dinner and beverage proivded; pt refused.

## 2021-12-02 DIAGNOSIS — F250 Schizoaffective disorder, bipolar type: Secondary | ICD-10-CM | POA: Diagnosis not present

## 2021-12-02 NOTE — ED Provider Notes (Signed)
Emergency Medicine Observation Re-evaluation Note  Edvin Albus is a 41 y.o. male, seen on rounds today.  Pt initially presented to the ED for complaints of Suicidal and Homicidal Currently, the patient is resting comfortably.  Physical Exam  BP (!) 125/93 (BP Location: Right Arm)   Pulse 80   Temp 98.7 F (37.1 C) (Oral)   Resp 18   Ht '5\' 8"'$  (1.727 m)   Wt 68 kg   SpO2 95%   BMI 22.81 kg/m  Physical Exam General: sleeping Lungs: non labored resp Psych: no agitation.  ED Course / MDM  EKG:   I have reviewed the labs performed to date as well as medications administered while in observation.  Recent changes in the last 24 hours include no reported events this morning.  Plan  Current plan is for sw dispo.  Vinny Taranto is not under involuntary commitment.     Lucillie Garfinkel, MD 12/02/21 [phone]

## 2021-12-02 NOTE — ED Notes (Addendum)
Pt is not in good mood, requests not to talk at this time. Currently refuses vitals and further communication

## 2021-12-02 NOTE — ED Notes (Signed)
Vol pending placement  see note 

## 2021-12-02 NOTE — ED Notes (Signed)
Patient up to the bathroom, He is cooperative, no signs of distress, told nurse " I'm glad to see you" He is friendly and no behavioral issues noted, only 1 cup in room, will continue to monitor.

## 2021-12-02 NOTE — ED Notes (Signed)
Patient remains calm, He is safe, and He ate His lunch.

## 2021-12-02 NOTE — ED Notes (Signed)
Pt given snack. 

## 2021-12-02 NOTE — ED Notes (Signed)
Patient did not want to take abilify injection, said yes to medications, and v/s, but then started screaming and cursing about injection. Nurse left and will come back to talk to him when He is calm.

## 2021-12-02 NOTE — ED Notes (Signed)
Pt to restroom and is in better mood, states he has had a bad day and just wants to leave. Pt is educated on possible meeting tomorrow and pt is excited. Pt provided with night snack

## 2021-12-02 NOTE — TOC Progression Note (Signed)
Transition of Care [facility]) - Progression Note    Patient Details  Name: Ryan Zuniga MRN: [id number] Date of Birth: 08-22-1980  Transition of Care [facility]) CM/SW Contact  Shelbie Hutching, RN Phone Number: 12/02/2021, 3:36 PM  Clinical Narrative:    [facility] Director Gaspar Bidding [place],[phone], came to see patient yesterday.  He reports that the meeting went well and he has a provider that is interested in patient and will be coming out tomorrow morning to meet with patient, her name is Veverly Fells with [employer].     Expected Discharge Plan: Group Home Barriers to Discharge: Other (must enter comment) (Group home will not take back)  Expected Discharge Plan and Services Expected Discharge Plan: Group Home   Discharge Planning Services: CM Consult   Living arrangements for the past 2 months: Group Home                 DME Arranged: N/A DME Agency: NA       HH Arranged: NA HH Agency: NA         Social Determinants of Health (SDOH) Interventions    Readmission Risk Interventions     No data to display

## 2021-12-02 NOTE — ED Notes (Signed)
Patient called Nurse and said " Im sorry for how I acted, I just don't like that shot, but I will take it now and all medications, also ask for coffee, nurse administered medications and did let him have coffee, Patient is calm and cooperative at this time.

## 2021-12-03 DIAGNOSIS — F250 Schizoaffective disorder, bipolar type: Secondary | ICD-10-CM | POA: Diagnosis not present

## 2021-12-03 NOTE — ED Notes (Signed)
Pt received lunch tray 

## 2021-12-03 NOTE — ED Notes (Signed)
Pt refusing dinner tray and drink. Pt only took cookies from tray.

## 2021-12-03 NOTE — ED Notes (Signed)
VOL/pending placement 

## 2021-12-03 NOTE — ED Notes (Signed)
Pt given snack. 

## 2021-12-03 NOTE — ED Notes (Signed)
Pt is very hopeful and in a good mood after a successful meeting today. Pt feel happy with how it went

## 2021-12-03 NOTE — ED Notes (Signed)
Staff from [employer] came to interview pt and pt did well- staff member stated she will try to have one of the other members come to talk to him tomorrow morning

## 2021-12-03 NOTE — ED Notes (Signed)
Report received from [place], Conservation officer, nature. Patient alert and oriented, warm and dry, in no acute distress. Patient denies SI, HI, AVH and pain. Patient made aware of Q15 minute rounds and security cameras for their safety. Patient instructed to come to this nurse with needs or concerns.

## 2021-12-03 NOTE — ED Notes (Signed)
Snack and beverage provided  

## 2021-12-04 DIAGNOSIS — F250 Schizoaffective disorder, bipolar type: Secondary | ICD-10-CM | POA: Diagnosis not present

## 2021-12-04 NOTE — ED Notes (Signed)
Vol pending placement see note on clipboard

## 2021-12-04 NOTE — ED Notes (Signed)
Pt denied vitals and breakfast. No other needs at this time

## 2021-12-04 NOTE — ED Provider Notes (Signed)
Emergency Medicine Observation Re-evaluation Note  Ryan Zuniga is a 41 y.o. male, seen on rounds today.  Pt initially presented to the ED for complaints of Suicidal and Homicidal Currently, the patient is sleeping comfortably, denies complaints when woken.  Physical Exam  BP (!) 135/90 (BP Location: Left Arm)   Pulse 74   Temp 98.5 F (36.9 C) (Oral)   Resp 17   Ht '5\' 8"'$  (1.727 m)   Wt 68 kg   SpO2 99%   BMI 22.81 kg/m  Physical Exam Constitutional: Resting comfortably. Eyes: Conjunctivae are normal. Head: Atraumatic. Nose: No congestion/rhinnorhea. Mouth/Throat: Mucous membranes are moist. Neck: Normal ROM Cardiovascular: No cyanosis noted. Respiratory: Normal respiratory effort. Gastrointestinal: Non-distended. Genitourinary: deferred Musculoskeletal: No lower extremity tenderness nor edema. Neurologic:  Normal speech and language. No gross focal neurologic deficits are appreciated. Skin:  Skin is warm, dry and intact. No rash noted.   ED Course / MDM  EKG:   I have reviewed the labs performed to date as well as medications administered while in observation.  Recent changes in the last 24 hours include none.  Plan  Current plan is for dispo per social work.  Ryan Zuniga is not under involuntary commitment.     Blake Divine, MD 12/04/21 1118

## 2021-12-04 NOTE — ED Notes (Signed)
Hospital meal provided, Pt denied.

## 2021-12-04 NOTE — NC FL2 (Signed)
[place] LEVEL OF CARE SCREENING TOOL     IDENTIFICATION  Patient Name: Ryan Zuniga Birthdate: 11-11-1980 Sex: male Admission Date (Current Location): 07/08/2021  [place] and [place] Number:  Engineering geologist and Address:  [facility], [address], [place], [place] [address]      Provider Number: [id number]  Attending Physician Name and Address:  No att. 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   Agitation 07/31/2015   Schizoaffective disorder, bipolar type ([place])    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   Tobacco use disorder 03/31/2015   Borderline personality disorder ([place]) 03/26/2015   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   Alcohol abuse 03/06/2015   Renal cell carcinoma ([place]) 06/17/2014    Orientation RESPIRATION BLADDER Height & Weight     Self, Time, Situation, Place  Normal Continent Weight: 68 kg Height:  '5\' 8"'$  (172.7 cm)  BEHAVIORAL SYMPTOMS/MOOD NEUROLOGICAL BOWEL NUTRITION STATUS  Verbally abusive (mostly calm and cooperative, has verbal outbursts when he gets upset)   Continent Diet (Regular)  AMBULATORY STATUS COMMUNICATION OF NEEDS Skin   Independent Verbally Normal                       Personal Care  Assistance Level of Assistance  Bathing, Feeding, Dressing Bathing Assistance: Independent Feeding assistance: Independent Dressing Assistance: Independent     Functional Limitations Info             SPECIAL CARE FACTORS FREQUENCY                       Contractures Contractures Info: Not present    Additional Factors Info  Code Status, Allergies, Psychotropic Code Status Info: Full Allergies Info: PCN, sulfa, ASA, Codeine Psychotropic Info: [place] personality disorder, intellectual disability with laugage disability and autisitic features, schizoaffective disorder, bipolar type--         Current Medications (12/04/2021):  This is the current hospital active medication list Current Facility-Administered Medications  Medication Dose Route Frequency Provider Last Rate Last Admin   ARIPiprazole ER (ABILIFY MAINTENA) injection 400 mg  400 mg Intramuscular Q28 days Patrecia Pour, NP   400 mg at 12/02/21 0908   benztropine (COGENTIN) tablet 2 mg  2 mg Oral BID Waldon Merl F, NP   2 mg at 12/04/21 0951   doxepin (SINEQUAN) capsule 20 mg  20 mg Oral QHS Waldon Merl F, NP   20 mg at 26/83/41 9622   folic acid (FOLVITE) tablet 1 mg  1 mg Oral Daily Waldon Merl F, NP   1 mg at 12/04/21 2979   hydrochlorothiazide (HYDRODIURIL) tablet 12.5 mg  12.5 mg Oral Daily Nena Polio, MD   12.5 mg at 12/04/21 8921   lamoTRIgine (LAMICTAL) tablet 25 mg  25 mg Oral Daily Sherlon Handing, NP  25 mg at 12/04/21 0953   LORazepam (ATIVAN) tablet 1 mg  1 mg Oral BID PRN Arta Silence, MD   1 mg at 12/03/21 0930   melatonin tablet 5 mg  5 mg Oral QHS Waldon Merl F, NP   5 mg at 12/03/21 2108   mirtazapine (REMERON) tablet 30 mg  30 mg Oral QHS Waldon Merl F, NP   30 mg at 12/03/21 2109   OLANZapine (ZYPREXA) tablet 10 mg  10 mg Oral Daily Patrecia Pour, NP   10 mg at 12/04/21 0954   OLANZapine zydis (ZYPREXA) disintegrating tablet 10 mg  10 mg Oral BID  PRN Patrecia Pour, NP   10 mg at 11/10/21 1003   propranolol (INDERAL) tablet 60 mg  60 mg Oral BID Rada Hay, MD   60 mg at 12/04/21 0954   terbinafine (LAMISIL) 1 % cream   Topical BID Nena Polio, MD   1 Application at 62/70/35 [phone]   Current Outpatient Medications  Medication Sig Dispense Refill   amLODipine (NORVASC) 5 MG tablet Take 1 tablet (5 mg total) by mouth daily. 90 tablet 0   ARIPiprazole (ABILIFY) 10 MG tablet Take 1 tablet (10 mg total) by mouth at bedtime. 90 tablet 0   benztropine (COGENTIN) 2 MG tablet Take 1 tablet (2 mg total) by mouth 2 (two) times daily. 180 tablet 0   doxepin (SINEQUAN) 10 MG capsule Take 2 capsules (20 mg total) by mouth at bedtime. 90 capsule 0   folic acid (FOLVITE) 1 MG tablet Take 1 tablet (1 mg total) by mouth daily. 90 tablet 0   LAMICTAL 100 MG tablet Take 1 tablet (100 mg total) by mouth daily. 90 tablet 0   lithium carbonate 150 MG capsule Take 1 capsule (150 mg total) by mouth 2 (two) times daily with a meal. 180 capsule 0   Melatonin 3-10 MG TABS Take 1 tablet by mouth at bedtime. 90 tablet 0   mirtazapine (REMERON) 30 MG tablet Take 1 tablet (30 mg total) by mouth at bedtime. 90 tablet 0   OLANZapine zydis (ZYPREXA) 10 MG disintegrating tablet Take 1 tablet (10 mg total) by mouth 3 (three) times daily as needed (agitation). 270 tablet 0   propranolol (INDERAL) 60 MG tablet Take 1 tablet (60 mg total) by mouth 2 (two) times daily. 180 tablet 0   ABILIFY MAINTENA 400 MG PRSY prefilled syringe SMARTSIG:1 Each IM Once a Month     ARIPiprazole Lauroxil ER 1064 MG/3.9ML PRSY Inject into the muscle. (Patient not taking: Reported on 07/09/2021)     GNP MELATONIN 3 MG TABS tablet Take 3 mg by mouth at bedtime. (Patient not taking: Reported on 07/09/2021)     hydrOXYzine (ATARAX/VISTARIL) 50 MG tablet Take 1 tablet (50 mg total) by mouth every 6 (six) hours as needed for anxiety. (Patient not taking: Reported on 02/11/2021) 90 tablet 0    mirtazapine (REMERON) 15 MG tablet Take 15 mg by mouth at bedtime. (Patient not taking: Reported on 02/11/2021)       Discharge Medications: Please see discharge summary for a list of discharge medications.  Relevant Imaging Results:  Relevant Lab Results:   Additional Information May need reminder to Shower, Smoker- has not smoked in 148 days of being in the hospital  Shelbie Hutching, RN

## 2021-12-04 NOTE — TOC Progression Note (Signed)
Transition of Care [facility]) - Progression Note    Patient Details  Name: Ryan Zuniga MRN: [id number] Date of Birth: 1981/03/31  Transition [facility] Care [facility]) CM/SW Contact  Shelbie Hutching, RN Phone Number: 12/04/2021, 12:54 PM  Clinical Narrative:    Provider from [name] came to see patient yesterday and another one today.  Meeting yesterday per the nurses went very well.  Patient was not feeling great today but the provider said he would try to come back and reassess on another day.     Expected Discharge Plan: Group Home Barriers to Discharge: Other (must enter comment) (Group home will not take back)  Expected Discharge Plan and Services Expected Discharge Plan: Group Home   Discharge Planning Services: CM Consult   Living arrangements for the past 2 months: Group Home                 DME Arranged: N/A DME Agency: NA       HH Arranged: NA HH Agency: NA         Social Determinants of Health (SDOH) Interventions    Readmission Risk Interventions     No data to display

## 2021-12-04 NOTE — ED Notes (Signed)
Pt given shower supplies and new sheets and clothing given.

## 2021-12-04 NOTE — ED Notes (Signed)
Report to include Situation, Background, Assessment, and Recommendations received from [employer]. Patient alert and oriented, warm and dry, in no acute distress. Patient denies SI, HI, AVH and pain. Patient made aware of Q15 minute rounds and security cameras for their safety. Patient instructed to come to me with needs or concerns.

## 2021-12-04 NOTE — ED Notes (Signed)
Snack and beverage given. 

## 2021-12-04 NOTE — ED Notes (Signed)
Pt gave back all shower supplies.

## 2021-12-04 NOTE — ED Notes (Signed)
Vol /pending placement 

## 2021-12-04 NOTE — ED Notes (Addendum)
Pt had refused dinner, but received a drink and cookies

## 2021-12-04 NOTE — ED Notes (Signed)
pt recieved snack and drink 

## 2021-12-04 NOTE — ED Notes (Signed)
Pt remains agitated. Visitor here from [employer] to assess pt. Visitor notified pt irritable at the moment but still wanted to assess him. Allowed with supervision of this RN, Lou-[place], and Neurosurgeon. Theodoro Clock, NP also present to answer any questions of visitor.

## 2021-12-05 DIAGNOSIS — F250 Schizoaffective disorder, bipolar type: Secondary | ICD-10-CM | POA: Diagnosis not present

## 2021-12-05 NOTE — ED Notes (Signed)
Snack and beverage given. 

## 2021-12-05 NOTE — ED Notes (Signed)
This RN, EDT, and Security attempted to give pt his breakfast tray. Pt was asked to bring cups out of his room, pt refused and became agitated and started yelling and cursing at staff. Pt refuses breakfast tray at this time. Pt states "fuck you bitches".

## 2021-12-05 NOTE — ED Notes (Signed)
RN to pts bedside. RN asked pt if he would take his medication. Pt agreeable to taking his medication. Pt asked for coffee. RN informed pt that we would let him have coffee if he gave [place] the dirty cups in his room. Pt agreeable to giving RN trash out of his room and to taking medications. Pt apologized for his earlier behavior. Pt placed trash into trash can. Pt was given decaf coffee cooled with ice.

## 2021-12-05 NOTE — ED Notes (Signed)
Patient is being calm, watching tv, He is safe, no signs of distress.

## 2021-12-05 NOTE — ED Notes (Signed)
Vol /pending placement 

## 2021-12-05 NOTE — ED Notes (Signed)
Pt given dinner tray and beverage  

## 2021-12-05 NOTE — ED Provider Notes (Signed)
Emergency Medicine Observation Re-evaluation Note  Cobey Raineri is a 41 y.o. male, seen on rounds today.  Pt initially presented to the ED for complaints of Suicidal and Homicidal Currently, the patient is calm, resting.  Physical Exam  BP 138/88   Pulse 78   Temp 98.3 F (36.8 C)   Resp 18   Ht '5\' 8"'$  (1.727 m)   Wt 68 kg   SpO2 96%   BMI 22.81 kg/m    ED Course / MDM  EKG:   I have reviewed the labs performed to date as well as medications administered while in observation.  Recent changes in the last 24 hours include none.  Plan  Current plan is for TTS disposition.  Taydon Nasworthy is not under involuntary commitment.     Duffy Bruce, MD 12/05/21 1056

## 2021-12-05 NOTE — ED Notes (Signed)
Report to include Situation, Background, Assessment, and Recommendations received from [facility]. Patient alert and oriented, warm and dry, in no acute distress. Patient denies SI, HI, AVH and pain. Patient made aware of Q15 minute rounds and security cameras for their safety. Patient instructed to come to me with needs or concerns.

## 2021-12-06 DIAGNOSIS — F250 Schizoaffective disorder, bipolar type: Secondary | ICD-10-CM | POA: Diagnosis not present

## 2021-12-06 NOTE — ED Notes (Signed)
Pt refused food tray but took cookies

## 2021-12-06 NOTE — ED Notes (Signed)
Hospital meal provided, pt tolerated w/o complaints.  Waste discarded appropriately.  

## 2021-12-06 NOTE — ED Notes (Signed)
Report to include Situation, Background, Assessment, and Recommendations received from [facility]. Patient alert and oriented, warm and dry, in no acute distress. Patient denies SI, HI, AVH and pain. Patient made aware of Q15 minute rounds and security cameras for their safety. Patient instructed to come to me with needs or concerns.

## 2021-12-06 NOTE — ED Notes (Signed)
Patient gave some of His cups and trash to throw away, He now is in the shower, He is pleasant and cooperative. Will continue to monitor, camera surveillance in progress and q 15 minute checks for safety.

## 2021-12-06 NOTE — ED Notes (Signed)
Unlocked bathroom door to allow patient to shower.  Staff continuously monitored dayroom outside of bathroom door during pt shower.  Pt was given hygiene items as well as:  I clean top, 1 clean bottom, with 1 pair of disposable underwear.  Pt changed out into clean clothing.  Staff disposed of all shower supplies. Shower room cleaned and secured for next use.

## 2021-12-06 NOTE — ED Notes (Signed)
Pt's room cleaned of all cups and trash. Pt given dinner tray.

## 2021-12-06 NOTE — ED Notes (Signed)
Snack and beverage given. 

## 2021-12-06 NOTE — ED Notes (Signed)
Patient is vol pending placement 

## 2021-12-06 NOTE — ED Notes (Signed)
VOL/pending placement 

## 2021-12-07 DIAGNOSIS — F250 Schizoaffective disorder, bipolar type: Secondary | ICD-10-CM | POA: Diagnosis not present

## 2021-12-07 NOTE — ED Notes (Addendum)
Pt now agreeable to vitals and medication administration.

## 2021-12-07 NOTE — ED Notes (Signed)
Refusing breakfast, vitals, and medications.

## 2021-12-07 NOTE — ED Provider Notes (Signed)
Emergency Medicine Observation Re-evaluation Note  Ryan Zuniga is a 41 y.o. male, seen on rounds today.  Pt initially presented to the ED for complaints of Suicidal and Homicidal Currently, the patient is sleeping comfortably.  Physical Exam  BP (!) 145/100 (BP Location: Right Arm)   Pulse 67   Temp 97.9 F (36.6 C) (Oral)   Resp 18   Ht '5\' 8"'$  (1.727 m)   Wt 68 kg   SpO2 99%   BMI 22.81 kg/m  Physical Exam General: Resting comfortably, no acute distress Psych: No agitation  ED Course / MDM  EKG:   I have reviewed the labs performed to date as well as medications administered while in observation.  Recent changes in the last 24 hours include no reported events this morning.  Plan  Current plan is social work disposition.  Ryan Zuniga is not under involuntary commitment.     Ryan Garfinkel, MD 12/07/21 [phone]

## 2021-12-07 NOTE — ED Notes (Signed)
Vol /pending placement 

## 2021-12-07 NOTE — ED Notes (Signed)
Writer removed cups in room.

## 2021-12-07 NOTE — ED Notes (Signed)
Lunch provided.

## 2021-12-07 NOTE — ED Notes (Addendum)
Refused meal.

## 2021-12-07 NOTE — ED Notes (Signed)
Snack provided

## 2021-12-07 NOTE — ED Notes (Signed)
Pt offered shower and encouraged to clean room, refuses. Appears agitated.

## 2021-12-08 DIAGNOSIS — F250 Schizoaffective disorder, bipolar type: Secondary | ICD-10-CM | POA: Diagnosis not present

## 2021-12-08 NOTE — TOC Progression Note (Signed)
Transition of Care [facility]) - Progression Note    Patient Details  Name: Ryan Zuniga MRN: [id number] Date of Birth: June 12, 1980  Transition [facility] Care [facility]) CM/SW Contact  Shelbie Hutching, RN Phone Number: 12/08/2021, 2:04 PM  Clinical Narrative:    [facility] has accepted patient but they are trying to find the right home for him, several providers from homes came out last week but they did not feel he would be a good fit.  Mackie Pai, [phone], from [facility] is hopeful that he can get another provider out to assess patient by the end of the week.     Expected Discharge Plan: Group Home Barriers to Discharge: Other (must enter comment) (Group home will not take back)  Expected Discharge Plan and Services Expected Discharge Plan: Group Home   Discharge Planning Services: CM Consult   Living arrangements for the past 2 months: Group Home                 DME Arranged: N/A DME Agency: NA       HH Arranged: NA HH Agency: NA         Social Determinants of Health (SDOH) Interventions    Readmission Risk Interventions     No data to display

## 2021-12-08 NOTE — ED Notes (Signed)
Pt had grabbed the phone off chair in the dayroom and went to his room. Pt reminded he has restricted phone use due to calling 911 yesterday. Phone taken by security. Pt yelling profanities at staff and walking angrily around dayroom.

## 2021-12-08 NOTE — ED Notes (Signed)
Vol /pending placement 

## 2021-12-08 NOTE — ED Provider Notes (Signed)
Emergency Medicine Observation Re-evaluation Note  Ryan Zuniga is a 41 y.o. male, seen on rounds today  Physical Exam  BP 130/87 (BP Location: Left Arm)   Pulse 89   Temp 98.4 F (36.9 C) (Oral)   Resp 16   Ht '5\' 8"'$  (1.727 m)   Wt 68 kg   SpO2 95%   BMI 22.81 kg/m  Physical Exam General: Patient resting comfortably in bed Lungs: Patient not in respiratory distress Psych: Patient not combative  ED Course / MDM  EKG:     Plan  Current plan is for social work placement.  Ryan Zuniga is not under involuntary commitment.     Ryan Polio, MD 12/08/21 [phone]

## 2021-12-08 NOTE — ED Notes (Signed)
Report received from Seth Bake, Conservation officer, nature. Patient alert and oriented, warm and dry, in no acute distress. Patient denies SI, HI, AVH and pain. Patient made aware of Q15 minute rounds and security cameras for their safety. Patient instructed to come to this nurse with needs or concerns.

## 2021-12-08 NOTE — ED Notes (Signed)
Cup of water provided per pt request. Pt ambulatory to restroom with steady gait.

## 2021-12-08 NOTE — ED Notes (Signed)
Snack given to pt.

## 2021-12-08 NOTE — ED Notes (Signed)
Pt provided with snack at this time

## 2021-12-08 NOTE — ED Notes (Signed)
Dinner tray offered to PT. PT refused then asked for sandwich tray. Sandwich tray given.

## 2021-12-08 NOTE — ED Notes (Signed)
pt refused breakfast; given bagel and coffee instead

## 2021-12-08 NOTE — ED Notes (Signed)
Lunch tray and beverage provided 

## 2021-12-09 DIAGNOSIS — F250 Schizoaffective disorder, bipolar type: Secondary | ICD-10-CM | POA: Diagnosis not present

## 2021-12-09 MED ORDER — LORAZEPAM 2 MG/ML IJ SOLN
2.0000 mg | Freq: Once | INTRAMUSCULAR | Status: AC
  Administered 2021-12-09: 2 mg via INTRAMUSCULAR
  Filled 2021-12-09: qty 1

## 2021-12-09 NOTE — ED Notes (Signed)
ED Doctor came over to assess Patient and He started yelling, telling him to get out of His room, and called Doctor names, cursing at him, Patient refused medications also from nurse, will continue to monitor Patient.

## 2021-12-09 NOTE — ED Notes (Signed)
Patient took all of po medications, and had late breakfast, He is calm and cooperative, no behavioral issues at this time and He apologized for His angry episode earlier, He said " Im just having a bad day" Nurse talked to him about not being impulsive and let him know that we were here for him, and He said" I have been here 6 months and I just feel bad" Nurse showed empathy and listened, will continue to monitor.

## 2021-12-09 NOTE — ED Notes (Signed)
VOL placement note on clipboard

## 2021-12-09 NOTE — ED Notes (Signed)
Two representatives are here to talk to Ryan Zuniga about a group home and were very friendly and Ryan Zuniga was calm and cooperative, no behavioral issues, maintained calmness and composure, they seemed to be very interested in Ryan Zuniga as a Therapist, occupational. They stated that someone from one of their group homes would come back to see Ryan Zuniga again soon.

## 2021-12-09 NOTE — ED Notes (Signed)
Patient refused breakfast 

## 2021-12-09 NOTE — ED Provider Notes (Signed)
Emergency Medicine Observation Re-evaluation Note  Ryan Zuniga is a 41 y.o. male, seen on rounds today.  Pt initially presented to the ED for complaints of Suicidal and Homicidal Currently, the patient is resting in his room, although started yelling "leave me alone" when I try to come in and talk to him.  Physical Exam  BP 124/84   Pulse 79   Temp 97.8 F (36.6 C)   Resp 17   Ht '5\' 8"'$  (1.727 m)   Wt 68 kg   SpO2 98%   BMI 22.81 kg/m  Physical Exam General: Comfortable appearing Cardiac: Well perfused Lungs: Normal respiratory effort Psych: Agitated, labile  ED Course / MDM   I have reviewed the labs performed to date as well as medications administered while in observation.  The patient has had some episodes of agitation and inappropriate behavior since yesterday.  Plan  Current plan is for placement.  Ryan Zuniga is not under involuntary commitment.     Arta Silence, MD 12/09/21 [phone]

## 2021-12-09 NOTE — ED Notes (Signed)
Patient in room screaming at staff and cursing. Writer tried to deescalate patient verbally. Patient continued to act aggressive toward staff.

## 2021-12-09 NOTE — ED Notes (Signed)
Patient refused vs at this time.

## 2021-12-09 NOTE — ED Notes (Signed)
VOL/Pending Placement 

## 2021-12-09 NOTE — ED Notes (Signed)
Patient ate 50% of supper and beverage. No signs of distress.

## 2021-12-09 NOTE — TOC Progression Note (Signed)
Transition of Care [facility]) - Progression Note    Patient Details  Name: Ryan Zuniga MRN: [id number] Date of Birth: [birth year]  [facility]) CM/SW Contact  Shelbie Hutching, RN Phone Number: 12/09/2021, 4:23 PM  Clinical Narrative:    Provider from [facility] came and met with patient at bedside this afternoon, meeting seemed to go well.  RNCM will reach out to [facility] if no feedback received by tomorrow afternoon.    Expected Discharge Plan: Group Home Barriers to Discharge: Other (must enter comment) (Group home will not take back)  Expected Discharge Plan and Services Expected Discharge Plan: Group Home   Discharge Planning Services: CM Consult   Living arrangements for the past 2 months: Group Home                 DME Arranged: N/A DME Agency: NA       HH Arranged: NA HH Agency: NA         Social Determinants of Health (SDOH) Interventions    Readmission Risk Interventions     No data to display

## 2021-12-10 DIAGNOSIS — F250 Schizoaffective disorder, bipolar type: Secondary | ICD-10-CM | POA: Diagnosis not present

## 2021-12-10 NOTE — ED Notes (Signed)
Pt given snack. 

## 2021-12-10 NOTE — ED Notes (Signed)
Pt given snack and drink 

## 2021-12-10 NOTE — ED Provider Notes (Signed)
Emergency Medicine Observation Re-evaluation Note  Branko Steeves is a 41 y.o. male, seen on rounds today.    Physical Exam  BP (!) 133/90   Pulse 83   Temp 97.8 F (36.6 C) (Oral)   Resp 16   Ht '5\' 8"'$  (1.727 m)   Wt 68 kg   SpO2 96%   BMI 22.81 kg/m  Physical Exam General: Patient resting comfortably in bed Lungs: Patient in no respiratory distress Psych: Patient not combative  ED Course / MDM  EKG:     Plan  Current plan is for social work placement.  Erin Uecker is not under involuntary commitment.     Nena Polio, MD 12/10/21 [phone]

## 2021-12-10 NOTE — ED Notes (Signed)
Pt received lunch tray 

## 2021-12-11 DIAGNOSIS — F250 Schizoaffective disorder, bipolar type: Secondary | ICD-10-CM | POA: Diagnosis not present

## 2021-12-11 NOTE — ED Notes (Signed)
VOL/pending placement 

## 2021-12-11 NOTE — ED Notes (Signed)
Report to include Situation, Background, Assessment, and Recommendations received from [place] RN. Patient alert and oriented, warm and dry, in no acute distress. Patient denies SI, HI, AVH and pain. Patient made aware of Q15 minute rounds and security cameras for their safety. Patient instructed to come to me with needs or concerns.

## 2021-12-11 NOTE — ED Notes (Signed)
Pt refused dinner and drink at this time.

## 2021-12-11 NOTE — ED Notes (Signed)
Snack and beverage given. 

## 2021-12-11 NOTE — ED Notes (Signed)
Pt given snack. 

## 2021-12-11 NOTE — ED Provider Notes (Signed)
Emergency Medicine Observation Re-evaluation Note  Ryan Zuniga is a 41 y.o. male, initially seen in the emergency department for psychiatric concern.  No acute events since last update.  Physical Exam  BP (!) 142/101 (BP Location: Right Arm)   Pulse 85   Temp 97.8 F (36.6 C) (Oral)   Resp 16   Ht '5\' 8"'$  (1.727 m)   Wt 68 kg   SpO2 97%   BMI 22.81 kg/m    ED Course / MDM  No recent lab work for review Plan  Current plan is for placement to an appropriate living facility once available.  Giancarlos Berendt is not under involuntary commitment.     Harvest Dark, MD 12/11/21 [phone]

## 2021-12-11 NOTE — ED Notes (Signed)
Vol /pending placement 

## 2021-12-12 DIAGNOSIS — F250 Schizoaffective disorder, bipolar type: Secondary | ICD-10-CM | POA: Diagnosis not present

## 2021-12-12 NOTE — ED Notes (Signed)
VOL/pending placement 

## 2021-12-12 NOTE — ED Notes (Signed)
Provided pt with a dinner tray

## 2021-12-12 NOTE — ED Notes (Signed)
Provided pt with a cup of sprite,pt returned 2 cups. Pt is calm and cooperative at this time. No other needs

## 2021-12-12 NOTE — ED Notes (Signed)
Pt given snack, pt has no other needs at the moment.

## 2021-12-12 NOTE — ED Notes (Signed)
Patient was not hungry for lunch, will continue to monitor, He did drink beverages, He is safe. No behavioral issues noted.

## 2021-12-12 NOTE — ED Provider Notes (Signed)
Emergency Medicine Observation Re-evaluation Note  Ryan Zuniga is a 41 y.o. male, seen on rounds today.  Pt initially presented to the ED for complaints of Suicidal and Homicidal Currently, the patient is resting comfortably in his room and has no acute complaints.  Physical Exam  BP (!) 146/102 (BP Location: Left Arm)   Pulse (!) 102   Temp 97.7 F (36.5 C) (Oral)   Resp 18   Ht '5\' 8"'$  (1.727 m)   Wt 68 kg   SpO2 100%   BMI 22.81 kg/m  Physical Exam General: Comfortable appearing Cardiac: Well perfused Lungs: Normal respiratory effort Psych: Calm and cooperative  ED Course / MDM   I have reviewed the labs performed to date as well as medications administered while in observation.  There have been no changes to his status in the last 24 hours.  Plan  Current plan is for placement.  [name] is not under involuntary commitment.     Arta Silence, MD 12/12/21 [phone]

## 2021-12-12 NOTE — ED Notes (Signed)
Patient is compliant and cooperative, took po medications this morning, denies Si/hi or avh, camera surveillance in progress for safety.

## 2021-12-13 DIAGNOSIS — F250 Schizoaffective disorder, bipolar type: Secondary | ICD-10-CM | POA: Diagnosis not present

## 2021-12-13 NOTE — ED Notes (Signed)
Pt came to door, used bathroom again, and was handed breakfast tray and took AM meds. Pt is calm and cooperative at this time. Pt handed back 3 cups. Pt does have many cups in room (at least 14) but they are symmetrically organized along the walls and are either empty or contain small amounts of water.

## 2021-12-13 NOTE — ED Notes (Signed)
Snack and beverage given. 

## 2021-12-13 NOTE — ED Notes (Signed)
Gave lunch tray and coffee.

## 2021-12-13 NOTE — ED Notes (Signed)
Pt had been in happy mood all day. Pt just came out of room stomping. Other pts in dayroom asked pt if he was okay and he walked off yelling "fuck you!". Pt then came out and asked Rn for phone. Pt is in room and calm with nurse. Phone given.

## 2021-12-13 NOTE — ED Notes (Signed)
Pt up again, came to door asking for sprite. Sprite given to pt. Pt back in room.

## 2021-12-13 NOTE — ED Notes (Signed)
Pt asked for more sprite. Already gave sprite. Offered water, pt stomped off to room. Pt pacing in room now.

## 2021-12-13 NOTE — ED Notes (Signed)
VOL/pending placement 

## 2021-12-13 NOTE — ED Notes (Signed)
Pt got up, used bathroom. Pt back in room.

## 2021-12-13 NOTE — ED Notes (Signed)
Patient is vol pending placement 

## 2021-12-13 NOTE — ED Notes (Signed)
Report to include Situation, Background, Assessment, and Recommendations received from  [facility]. Patient alert and oriented, warm and dry, in no acute distress. Patient denies SI, HI, AVH and pain. Patient made aware of Q15 minute rounds and security cameras for their safety. Patient instructed to come to me with needs or concerns.

## 2021-12-14 DIAGNOSIS — F250 Schizoaffective disorder, bipolar type: Secondary | ICD-10-CM | POA: Diagnosis not present

## 2021-12-14 NOTE — ED Notes (Addendum)
Pt given cup of coffee after taking getting his VS taken and taking his medications. Pt did give two cups to this nurse after receiving coffee. Pt had refused breakfast and VS from tech earlier. Alert and calm at this time.

## 2021-12-14 NOTE — TOC Progression Note (Signed)
Transition of Care [facility]) - Progression Note    Patient Details  Name: Carlo Guevarra MRN: [id number] Date of Birth: 19-Jan-1981  Transition of Care [facility]) CM/SW Contact  Shelbie Hutching, RN Phone Number: 12/14/2021, 3:55 PM  Clinical Narrative:    RNCM spoke with Mackie Pai with [facility], they have a provider that interviewed with Gerald Stabs and would like to offer a bed but they want to ensure that it would be acceptable for him to have a roommate.  Gerald Stabs expressed to the nurse today that he would be happy with a roommate.  Gaspar Bidding would still like to hear back from [place] IDD coordinator.   Patient thought he was leaving today and was very excited this morning.  There are no plans for him to leave at the present still waiting on confirmation of placement.     Expected Discharge Plan: Group Home Barriers to Discharge: Other (must enter comment) (Group home will not take back)  Expected Discharge Plan and Services Expected Discharge Plan: Group Home   Discharge Planning Services: CM Consult   Living arrangements for the past 2 months: Group Home                 DME Arranged: N/A DME Agency: NA       HH Arranged: NA HH Agency: NA         Social Determinants of Health (SDOH) Interventions    Readmission Risk Interventions     No data to display

## 2021-12-14 NOTE — ED Notes (Signed)
VOL  PENDING  PLACEMENT 

## 2021-12-14 NOTE — ED Provider Notes (Signed)
Emergency Medicine Observation Re-evaluation Note  Ryan Zuniga is a 41 y.o. male, seen on rounds today.  Pt initially presented to the ED for complaints of Suicidal and Homicidal Currently, the patient is resting in his room and has no acute complaints.  Physical Exam  BP (!) 151/105 (BP Location: Right Arm)   Pulse 62   Temp 98.2 F (36.8 C) (Oral)   Resp 18   Ht '5\' 8"'$  (1.727 m)   Wt 68 kg   SpO2 93%   BMI 22.81 kg/m  Physical Exam General: No distress Cardiac: Well perfused Lungs: Normal respiratory effort Psych: Calm and cooperative  ED Course / MDM   I have reviewed the labs performed to date as well as medications administered while in observation.  There have been no changes to his status in the last 24 hours.  Plan  Current plan is for placement.  Ryan Zuniga is not under involuntary commitment.     Arta Silence, MD 12/14/21 [phone]

## 2021-12-14 NOTE — ED Notes (Signed)
Pt asking to use phone. Informed that he would have to wait until next phone block due to his cursing and yelling earlier. Verbalized understanding.

## 2021-12-14 NOTE — ED Notes (Signed)
Pt given nighttime snack. 

## 2021-12-14 NOTE — ED Notes (Signed)
Snack and beverage given. 

## 2021-12-14 NOTE — ED Notes (Signed)
Snack and beverage provided  

## 2021-12-14 NOTE — ED Notes (Signed)
Pt up to restroom with stead gait and then to nurses station asking for a drink. Pt had been told by previous shift nurse that he had to bring his trash from his room to throw away at the nurses station and she would give him something to drink. When pt reminded about his trash pt states "fuck you" and walked back to his room.

## 2021-12-14 NOTE — ED Notes (Signed)
Report to include Situation, Background, Assessment, and Recommendations received from [facility]. Patient alert and oriented, warm and dry, in no acute distress. Patient denies SI, HI, AVH and pain. Patient made aware of Q15 minute rounds and security cameras for their safety. Patient instructed to come to me with needs or concerns.

## 2021-12-14 NOTE — ED Notes (Signed)
Remainder of cups removed from room; 15 in total. Pt yelling and cursing at staff.

## 2021-12-14 NOTE — ED Notes (Signed)
Pt given snack. 

## 2021-12-14 NOTE — ED Provider Notes (Signed)
Emergency Medicine Observation Re-evaluation Note  Ryan Zuniga is a 41 y.o. male initially seen in the emergency department for psychiatric complaint.  No acute events since last update.  Physical Exam  BP (!) 133/98 (BP Location: Left Arm)   Pulse 74   Temp 98 F (36.7 C) (Oral)   Resp 16   Ht '5\' 8"'$  (1.727 m)   Wt 68 kg   SpO2 96%   BMI 22.81 kg/m    ED Course / MDM   No recent lab work for review  Plan  Current plan is for placement to an appropriate living facility once available.  Ryan Zuniga is not under involuntary commitment.     Harvest Dark, MD 12/14/21 1200

## 2021-12-14 NOTE — ED Notes (Signed)
Dinner tray and beverage offered. Pt refused dinner.

## 2021-12-14 NOTE — ED Notes (Signed)
Pt standing up outside of room while room gets cleaned by EVS. Tolerated well.

## 2021-12-15 DIAGNOSIS — F250 Schizoaffective disorder, bipolar type: Secondary | ICD-10-CM | POA: Diagnosis not present

## 2021-12-15 NOTE — ED Provider Notes (Signed)
Emergency Medicine Observation Re-evaluation Note  Ryan Zuniga is a 41 y.o. male, seen on rounds today.  Pt initially presented to the ED for complaints of Suicidal and Homicidal   Physical Exam  BP (!) 148/97 (BP Location: Left Arm)   Pulse 75   Temp 98 F (36.7 C) (Oral)   Resp 18   Ht '5\' 8"'$  (1.727 m)   Wt 68 kg   SpO2 97%   BMI 22.81 kg/m  Physical Exam General: no distress Lungs: no increased wob Psych: no agitation  ED Course / MDM  EKG:   I have reviewed the labs performed to date as well as medications administered while in observation.  Recent changes in the last 24 hours include none.  Plan  Current plan is for SW placement.    Rada Hay, MD 12/15/21 [phone]

## 2021-12-15 NOTE — ED Notes (Signed)
Pt refused snack, and drink

## 2021-12-15 NOTE — ED Notes (Signed)
Refused v/s

## 2021-12-15 NOTE — ED Notes (Signed)
Pt given dinner tray and beverage  

## 2021-12-15 NOTE — ED Notes (Addendum)
Pt assisted with cleaning his room which included wiping mattress, changing bed linens, and and throwing away garbage. Pt put clean sheet on bed.

## 2021-12-15 NOTE — ED Notes (Signed)
Pt given snack. 

## 2021-12-15 NOTE — ED Notes (Signed)
VOL  PENDING  PLACEMENT 

## 2021-12-15 NOTE — ED Notes (Signed)
Vol /pending placement 

## 2021-12-15 NOTE — ED Notes (Addendum)
Pt out of room asking to use the restroom with pillow in his hand. Pt asked by ED tech if he was going to take the pillow into the restroom. Pt proceeded to yell and curse at staff to "mind your own business".  Went back to room without using restroom.

## 2021-12-15 NOTE — ED Notes (Signed)
Pt given phone to call his dad

## 2021-12-16 DIAGNOSIS — F250 Schizoaffective disorder, bipolar type: Secondary | ICD-10-CM | POA: Diagnosis not present

## 2021-12-16 NOTE — ED Notes (Signed)
Report received from Abigail Butts, Conservation officer, nature. Patient alert and oriented, warm and dry, in no acute distress. Pt refusing to speak to staff, agitated but remains in room. Patient made aware of Q15 minute rounds and security cameras for their safety. Patient instructed to come to this nurse with needs or concerns.

## 2021-12-16 NOTE — ED Notes (Signed)
Vol /pending placement 

## 2021-12-16 NOTE — ED Notes (Signed)
Pt out of room, asks to go to restroom but is carrying his pillow and a cup that is in his pants. Pt is informed he can go but pillow needs to be left out and cup needs to be also. He storms back to room and does not say anything, clearly irritated, to room

## 2021-12-16 NOTE — ED Notes (Signed)
Pt given snack. 

## 2021-12-16 NOTE — ED Provider Notes (Signed)
Emergency Medicine Observation Re-evaluation Note  Ryan Zuniga is a 41 y.o. male, seen on rounds today.  Pt initially presented to the ED for complaints of Suicidal and Homicidal Currently, the patient is calm.  Physical Exam  BP (!) 141/94 (BP Location: Left Arm)   Pulse 68   Temp 98.1 F (36.7 C) (Oral)   Resp 18   Ht '5\' 8"'$  (1.727 m)   Wt 68 kg   SpO2 97%   BMI 22.81 kg/m  Physical Exam General: calm   ED Course / MDM  EKG:   I have reviewed the labs performed to date as well as medications administered while in observation.  Recent changes in the last 24 hours include none.  Plan  Current plan is for psychiatric disposition.    Nance Pear, MD 12/16/21 [phone]

## 2021-12-16 NOTE — ED Notes (Signed)
Patient came to door to apologize for becoming upset about cups and said " I will throw cups all away" Nurse will continue to monitor. Patient is safe, and no behavioral issues noted.

## 2021-12-16 NOTE — ED Notes (Signed)
Patient refused breakfast 

## 2021-12-16 NOTE — ED Notes (Signed)
Patient is calm, took morning medications, He is safe, will continue to monitor.

## 2021-12-16 NOTE — ED Notes (Signed)
Patient agitated. Patient refused to have vital signs checked.

## 2021-12-17 DIAGNOSIS — F250 Schizoaffective disorder, bipolar type: Secondary | ICD-10-CM | POA: Diagnosis not present

## 2021-12-17 NOTE — TOC Progression Note (Signed)
Transition of Care [facility]) - Progression Note    Patient Details  Name: Ryan Zuniga MRN: [id number] Date of Birth: Sep 28, 1980  Transition of Care [facility]) CM/SW Contact  Shelbie Hutching, RN Phone Number: 12/17/2021, 9:51 AM  Clinical Narrative:    [facility] had accepted patient but now have rescinded the offer due to lack of funding from [place].  Venetia Constable is working on getting other meetings with alternative providers scheduled.     Expected Discharge Plan: Group Home Barriers to Discharge: Other (must enter comment) (Group home will not take back)  Expected Discharge Plan and Services Expected Discharge Plan: Group Home   Discharge Planning Services: CM Consult   Living arrangements for the past 2 months: Group Home                 DME Arranged: N/A DME Agency: NA       HH Arranged: NA HH Agency: NA         Social Determinants of Health (SDOH) Interventions    Readmission Risk Interventions     No data to display

## 2021-12-17 NOTE — ED Notes (Signed)
Snack and beverage given. 

## 2021-12-17 NOTE — ED Notes (Signed)
Report to include Situation, Background, Assessment, and Recommendations received from [facility]. Patient alert and oriented, warm and dry, in no acute distress. Patient denies SI, HI, AVH and pain. Patient made aware of Q15 minute rounds and security cameras for their safety. Patient instructed to come to me with needs or concerns.

## 2021-12-17 NOTE — ED Notes (Signed)
Pt given snack. 

## 2021-12-17 NOTE — ED Notes (Signed)
Vol /pending placement 

## 2021-12-17 NOTE — ED Notes (Signed)
Report received from Harrell Gave, Conservation officer, nature.  Patient alert and oriented, warm and dry. Pt in no acute distress.  Pt denies SI/HI/AV/H and pain.  Pt made aware of q 15 minute rounds and security cameras for their safety.  Patient instructed to come to this nurse with needs or concerns.

## 2021-12-17 NOTE — ED Notes (Signed)
Patient refused to have vitals checked.

## 2021-12-17 NOTE — ED Notes (Signed)
VOL/pending placement 

## 2021-12-17 NOTE — ED Notes (Signed)
Pt upset due to nursing staff monitoring his phone call.  Pt redirected but continues to escalate, screaming profane and vulgar words and obscene gestures toward staff and patients.  Emotional support and encouragement offered. Patient refused vital signs, dinner and medications to manage symptoms.

## 2021-12-17 NOTE — ED Notes (Signed)
Pt did approach nursing station requesting phone screaming any yelling. Pt redirected but continues to escalate, screaming profane and vulgar words and obscene gestures toward staff and patients.  Emotional support and encouragement offered.  Pt refused medications to manage symptoms.  Pt did return to room and did cooperate. Will contiue to monitor.

## 2021-12-17 NOTE — ED Notes (Signed)
Patient upset refused dinner. About an hour later patient asked for dinner tray and received it.

## 2021-12-18 DIAGNOSIS — F250 Schizoaffective disorder, bipolar type: Secondary | ICD-10-CM | POA: Diagnosis not present

## 2021-12-18 NOTE — ED Provider Notes (Signed)
Emergency Medicine Observation Re-evaluation Note  Ryan Zuniga is a 41 y.o. male, seen on rounds today.  Pt initially presented to the ED for complaints of Suicidal and Homicidal Currently, the patient is resting comfortably in a chair in his room.  Physical Exam  BP (!) 138/102 (BP Location: Right Arm)   Pulse 97   Temp 98.1 F (36.7 C) (Oral)   Resp 18   Ht '5\' 8"'$  (1.727 m)   Wt 68 kg   SpO2 95%   BMI 22.81 kg/m  Physical Exam General: No distress Cardiac: Well perfused Lungs: Normal respiratory effort Psych: Calm and cooperative  ED Course / MDM   I have reviewed the labs performed to date as well as medications administered while in observation.  There have been no changes to his status in the last 24 hours.  Plan  Current plan is for placement.    Arta Silence, MD 12/18/21 1521

## 2021-12-18 NOTE — ED Notes (Signed)
Patient refused breakfast and vitals at the time, RN notified.

## 2021-12-18 NOTE — ED Notes (Signed)
Two visitors here to interview pt for group home. Pt agrees to speak with them.

## 2021-12-18 NOTE — ED Notes (Signed)
Snack and beverage given. 

## 2021-12-18 NOTE — ED Notes (Signed)
Vol /pending placement 

## 2021-12-18 NOTE — ED Notes (Signed)
Pt received lunch tray and threw away trash as well as one cup from room.

## 2021-12-18 NOTE — ED Notes (Signed)
Report received from [place], Conservation officer, nature.  Pt alert oriented, warm and dry. Pt in no acute distress.  Pt remains in room refusing to speak to staff.  Pt made aware of q 15-minute safety rounds and security cameras for their safety.  Pt instructed to come to this Nurse with any needs or concerns.

## 2021-12-18 NOTE — ED Notes (Signed)
Report to include Situation, Background, Assessment, and Recommendations received from [place] RN. Patient alert and oriented, warm and dry, in no acute distress. Patient denies SI, HI, AVH and pain. Patient made aware of Q15 minute rounds and security cameras for their safety. Patient instructed to come to me with needs or concerns.

## 2021-12-18 NOTE — ED Notes (Signed)
VOL/Pending Placement 

## 2021-12-19 DIAGNOSIS — F250 Schizoaffective disorder, bipolar type: Secondary | ICD-10-CM | POA: Diagnosis not present

## 2021-12-19 NOTE — ED Notes (Signed)
Dinner tray offered to pt. Pt did not want dinner at this time

## 2021-12-19 NOTE — ED Notes (Signed)
Pt refused vitals 

## 2021-12-19 NOTE — ED Notes (Signed)
VOL/pending placement 

## 2021-12-19 NOTE — ED Provider Notes (Signed)
Emergency Medicine Observation Re-evaluation Note  Danton Palmateer is a 41 y.o. male, seen on rounds today.  Pt initially presented to the ED for complaints of Suicidal and Homicidal   Currently, the patient is asleep in bed- no issues per [facility] nurse  Physical Exam  Blood pressure (!) 137/96, pulse 70, temperature 98.3 F (36.8 C), temperature source Oral, resp. rate 18, height '5\' 8"'$  (1.727 m), weight 68 kg, SpO2 95 %.  Physical Exam General: No apparent distress Pulm: Normal WOB Psych: resting     ED Course / MDM     I have reviewed the labs performed to date as well as medications administered while in observation.  Recent changes in the last 24 hours include none  Plan   Current plan is to continue to wait for psych plan/placement if felt warranted  Patient is not under full IVC at this time.   Vanessa Bellefontaine, MD 12/19/21 [phone]

## 2021-12-19 NOTE — ED Notes (Signed)
Report to include Situation, Background, Assessment, and Recommendations received from [facility]. Patient alert and oriented, warm and dry, in no acute distress. Patient denies SI, HI, AVH and pain. Patient made aware of Q15 minute rounds and security cameras for their safety. Patient instructed to come to me with needs or concerns.

## 2021-12-20 DIAGNOSIS — F250 Schizoaffective disorder, bipolar type: Secondary | ICD-10-CM | POA: Diagnosis not present

## 2021-12-20 NOTE — ED Notes (Signed)
Pt offered lunch tray, pt refused lunch tray and asked for coffee, pt told he could have some coffee, but then pt started hitting walls and yelling about only wanting coffee. Pt then approached door to nurses station and yelled that he wanted coffee, pt told that he could have some once he calmed down. Pt yelled that he wanted a sprite instead and yelled, "fuck you bitch" at nursing staff. Pt walked back to room.

## 2021-12-20 NOTE — ED Notes (Signed)
Patient agreed to vitals.

## 2021-12-20 NOTE — ED Notes (Signed)
Patient refused breakfast and vitals.

## 2021-12-20 NOTE — ED Notes (Signed)
Retrieved extra cups from pt room. Pt has 2 remaining cups. Pt was calm and cooperative.

## 2021-12-20 NOTE — ED Provider Notes (Signed)
Emergency Medicine Observation Re-evaluation Note  Ryan Zuniga is a 41 y.o. male, seen on rounds today.  Pt initially presented to the ED for complaints of Suicidal and Homicidal   Physical Exam  BP (!) 156/110   Pulse 71   Temp 98.3 F (36.8 C) (Oral)   Resp 16   Ht '5\' 8"'$  (1.727 m)   Wt 68 kg   SpO2 97%   BMI 22.81 kg/m  Physical Exam General: sitting on side of the bed, comfortable, no distress Lungs: no increased wob Psych: no agitation  ED Course / MDM  EKG:   I have reviewed the labs performed to date as well as medications administered while in observation.  Recent changes in the last 24 hours include none.  Plan  Current plan is for SW placement.    Rada Hay, MD 12/20/21 [phone]

## 2021-12-20 NOTE — ED Notes (Signed)
VOL/Pending Placement 

## 2021-12-20 NOTE — ED Notes (Signed)
Pt went to bathroom

## 2021-12-20 NOTE — ED Notes (Signed)
Pt more agreeable, agreeing to vitals and meds

## 2021-12-20 NOTE — ED Notes (Signed)
Pt taking shower. Pt was given hygiene items and the following, 1 clean top, 1 clean bottom, with 1 pair of disposable underwear.  Pt changed out into clean clothing.  Staff disposed of all shower supplies.   

## 2021-12-20 NOTE — ED Notes (Signed)
Pt alert, took shower, changed clothes and bedding with no assist. Pt given snack tolerated well.

## 2021-12-20 NOTE — ED Notes (Signed)
Pt remaining in room, pt calmer at this time.

## 2021-12-20 NOTE — ED Notes (Signed)
Pt given dinner tray and beverage  

## 2021-12-21 DIAGNOSIS — F250 Schizoaffective disorder, bipolar type: Secondary | ICD-10-CM | POA: Diagnosis not present

## 2021-12-21 NOTE — ED Notes (Signed)
VOL  PENDING  PLACEMENT 

## 2021-12-21 NOTE — ED Notes (Signed)
Resumed care from ally rn.  Pt in room, alert.  Room dark.

## 2021-12-21 NOTE — ED Notes (Signed)
Pt alert, pt in room

## 2021-12-21 NOTE — ED Notes (Signed)
Hospital meal provided.  100% consumed, pt tolerated w/o complaints.  Waste discarded appropriately.   

## 2021-12-21 NOTE — ED Notes (Signed)
Given breakfast. Declined shower.

## 2021-12-21 NOTE — ED Notes (Signed)
Patient refused dinner tray.  

## 2021-12-21 NOTE — ED Notes (Signed)
Vol /pending placement 

## 2021-12-22 DIAGNOSIS — F250 Schizoaffective disorder, bipolar type: Secondary | ICD-10-CM | POA: Diagnosis not present

## 2021-12-22 NOTE — TOC Progression Note (Signed)
Transition of Care [facility]) - Progression Note    Patient Details  Name: Ryan Zuniga MRN: [id number] Date of Birth: Mar 13, 1981  [facility]) CM/SW Contact  Shelbie Hutching, RN Phone Number: 12/22/2021, 9:39 AM  Clinical Narrative:    Lynelle Smoke the IDD Care Coordinator with [facility] the guardian to see if there were any updates on placement.  There was another provider that came to interview patient the end of last week.     Expected Discharge Plan: Group Home Barriers to Discharge: Other (must enter comment) (Group home will not take back)  Expected Discharge Plan and Services Expected Discharge Plan: Group Home   Discharge Planning Services: CM Consult   Living arrangements for the past 2 months: Group Home                 DME Arranged: N/A DME Agency: NA       HH Arranged: NA HH Agency: NA         Social Determinants of Health (SDOH) Interventions    Readmission Risk Interventions     No data to display

## 2021-12-22 NOTE — ED Notes (Signed)
Patient is safe, no signs of distress, will continue to monitor, no behavioral issues noted, He denies Si/hi or avh.

## 2021-12-22 NOTE — ED Notes (Signed)
Hospital meal provided, pt tolerated w/o complaints.  Waste discarded appropriately.  

## 2021-12-22 NOTE — ED Notes (Signed)
Pt became irritable due to bathroom being occupied and he couldn't go in. Pt informed that when it was cleared he would be notified. When notified pt raised voice stating "I good." Pt then refused meds repeating same. Was not provided a snack due to current behavior. Will allow pt to enter restroom should he need to go

## 2021-12-22 NOTE — ED Notes (Signed)
Pt given snack. 

## 2021-12-22 NOTE — ED Provider Notes (Signed)
Emergency Medicine Observation Re-evaluation Note  Ryan Zuniga is a 41 y.o. male, seen on rounds today.  Pt initially presented to the ED for complaints of Suicidal and Homicidal  Currently, the patient is calm, no acute complaints.  Physical Exam  Blood pressure (!) 135/92, pulse 71, temperature 98 F (36.7 C), temperature source Oral, resp. rate 18, height '5\' 8"'$  (1.727 m), weight 68 kg, SpO2 99 %. Physical Exam General: NAD Lungs: CTAB Psych: not agitated  ED Course / MDM  EKG:    I have reviewed the labs performed to date as well as medications administered while in observation.  Recent changes in the last 24 hours include no acute events overnight.    Plan  Current plan is for SW placement. Patient is not under full IVC at this time.   Carrie Mew, MD 12/22/21 [phone]

## 2021-12-22 NOTE — ED Notes (Signed)
Patient up to door, no signs of distress, ask for bathroom and drink, nurse talked to Patient and let him know that He could have a cup of decaf with breakfast, Patient smiling and in good mood, denies Si/hi or avh, will continue to monitor.

## 2021-12-22 NOTE — ED Notes (Signed)
Report received from Abigail Butts, Conservation officer, nature. Patient alert and oriented, warm and dry, in no acute distress. Patient denies SI, HI, AVH and pain. Patient made aware of Q15 minute rounds and security cameras for their safety. Patient instructed to come to this nurse with needs or concerns.

## 2021-12-22 NOTE — ED Notes (Signed)
Vol/Pending Placement

## 2021-12-22 NOTE — ED Notes (Signed)
Patient did not receive snack due to refusal of meds.

## 2021-12-23 DIAGNOSIS — F250 Schizoaffective disorder, bipolar type: Secondary | ICD-10-CM | POA: Diagnosis not present

## 2021-12-23 NOTE — ED Notes (Signed)
Pt requesting hospital meal at this time. Pt was given hospital meal.

## 2021-12-23 NOTE — ED Notes (Signed)
Pt given snack. 

## 2021-12-23 NOTE — ED Notes (Signed)
Pt's father Xachary Hambly called at this time wishing to speak with pt. Pt not wishing to speak to father right now. Father states he will call.

## 2021-12-23 NOTE — ED Notes (Signed)
Vol /pending placement 

## 2021-12-23 NOTE — ED Provider Notes (Signed)
Emergency Medicine Observation Re-evaluation Note  Ryan Zuniga is a 41 y.o. male, seen in the emergency department for psychiatric complaint.  No acute events since last update.  Physical Exam  BP (!) 134/90 (BP Location: Left Arm)   Pulse 80   Temp 98.4 F (36.9 C) (Oral)   Resp 20   Ht '5\' 8"'$  (1.727 m)   Wt 68 kg   SpO2 94%   BMI 22.81 kg/m   ED Course / MDM  No recent lab work for review  Plan  Current plan is for placement to an appropriate living facility once available.    Harvest Dark, MD 12/23/21 [phone]

## 2021-12-23 NOTE — ED Notes (Signed)
Sandwich, drink and icecream provided

## 2021-12-23 NOTE — ED Notes (Signed)
VOL/pending placement 

## 2021-12-23 NOTE — ED Notes (Addendum)
This Probation officer and Nurse Caitlyn offered pt hospital meal. Pt refused meal provide. This Probation officer offered pt a sandwich tray as an alternative to not wanting hospital meal. Pt refused and repeatedly flicking and using profanity at ED staff.

## 2021-12-24 DIAGNOSIS — F250 Schizoaffective disorder, bipolar type: Secondary | ICD-10-CM | POA: Diagnosis not present

## 2021-12-24 NOTE — ED Notes (Signed)
Patient refused vitals at this time. RN notified.

## 2021-12-24 NOTE — ED Notes (Signed)
Report to include Situation, Background, Assessment, and Recommendations received from [facility]. Patient alert and oriented, warm and dry, in no acute distress. Patient denies SI, HI, AVH and pain. Patient made aware of Q15 minute rounds and security cameras for their safety. Patient instructed to come to me with needs or concerns.

## 2021-12-24 NOTE — ED Notes (Signed)
VOL  PENDING  PLACEMENT 

## 2021-12-24 NOTE — ED Notes (Signed)
Eating breakfast.  Calm and cooperative.

## 2021-12-24 NOTE — ED Notes (Signed)
Lunch and drink provided. Exchanged one of his cup for a drink.

## 2021-12-24 NOTE — ED Notes (Signed)
Pt refusing dinner tray at this time. Pt also refusing to throw any cups or trash away.

## 2021-12-24 NOTE — ED Notes (Addendum)
Patient refused to receive breakfast tray.

## 2021-12-24 NOTE — ED Provider Notes (Signed)
Emergency Medicine Observation Re-evaluation Note  Ryan Zuniga is a 41 y.o. male, seen on rounds today.  Pt initially presented to the ED for complaints of Suicidal and Homicidal Currently, the patient is resting.  Physical Exam  BP 114/86 (BP Location: Right Arm)   Pulse 87   Temp 98.6 F (37 C) (Oral)   Resp 18   Ht '5\' 8"'$  (1.727 m)   Wt 68 kg   SpO2 96%   BMI 22.81 kg/m  Physical Exam General: no distress Lungs: unlabored Psych: no agitation  ED Course / MDM  EKG:   I have reviewed the labs performed to date as well as medications administered while in observation.  Recent changes in the last 24 hours include no reported morning events.  Plan  Current plan is for dispo planning per SW.    Lucillie Garfinkel, MD 12/24/21 1100

## 2021-12-24 NOTE — ED Notes (Signed)
VOL/Pending Placement 

## 2021-12-25 NOTE — ED Notes (Addendum)
This Probation officer and Media planner and security was offering pt hospital meal. Pt became agitated and started to throw cups filled with unknown substance. Pt refused hospital meal.

## 2021-12-25 NOTE — ED Provider Notes (Signed)
Emergency Medicine Observation Re-evaluation Note  Ryan Zuniga is a 41 y.o. male, seen on rounds today.  Pt initially presented to the ED for complaints of Suicidal and Homicidal  Ambulatory in room no distress.  Reports no concerns.  Physical Exam  BP 130/86 (BP Location: Right Arm)   Pulse 86   Temp 97.8 F (36.6 C) (Oral)   Resp 16   Ht '5\' 8"'$  (1.727 m)   Wt 68 kg   SpO2 95%   BMI 22.81 kg/m  Physical Exam General: no distress Lungs: unlabored Psych: no agitation.  Walks with normal gait  ED Course / MDM  EKG:   I have reviewed the labs performed to date as well as medications administered while in observation.  Recent changes in the last 24 hours include no reported morning events.  Plan  Current plan is for dispo planning per SW.       Delman Kitten, MD 12/25/21 [phone]

## 2021-12-25 NOTE — ED Notes (Signed)
Pt in room flipping off staff through cameras.

## 2021-12-25 NOTE — ED Notes (Signed)
Report to include Situation, Background, Assessment, and Recommendations received from [facility]. Patient alert and oriented, warm and dry, in no acute distress. Patient denies SI, HI, AVH and pain. Patient made aware of Q15 minute rounds and security cameras for their safety. Patient instructed to come to me with needs or concerns.

## 2021-12-25 NOTE — ED Notes (Signed)
Pt requesting hospital meal. This Probation officer gave pt hospital meal.

## 2021-12-25 NOTE — ED Notes (Signed)
VOL/pending placement 

## 2021-12-25 NOTE — ED Notes (Signed)
Snack and beverage given. 

## 2021-12-25 NOTE — TOC Progression Note (Signed)
Transition of Care [facility]) - Progression Note    Patient Details  Name: Ryan Zuniga MRN: [id number] Date of Birth: 15-Sep-1980  Transition of Care [facility]) CM/SW Contact  Shelbie Hutching, RN Phone Number: 12/25/2021, 4:30 PM  Clinical Narrative:    [facility] would like to proceed with accepting patient and start the intake process they have requested intake application to be completed by guardian and other documents to be sent in to start the process.  TOC will provide a medication list and prescriptions.     Expected Discharge Plan: Group Home Barriers to Discharge: Other (must enter comment) (Group home will not take back)  Expected Discharge Plan and Services Expected Discharge Plan: Group Home   Discharge Planning Services: CM Consult   Living arrangements for the past 2 months: Group Home                 DME Arranged: N/A DME Agency: NA       HH Arranged: NA HH Agency: NA         Social Determinants of Health (SDOH) Interventions    Readmission Risk Interventions     No data to display

## 2021-12-26 DIAGNOSIS — F250 Schizoaffective disorder, bipolar type: Secondary | ICD-10-CM | POA: Diagnosis not present

## 2021-12-26 NOTE — ED Notes (Signed)
Hospital meal provided, pt denied.  Waste discarded appropriately

## 2021-12-26 NOTE — ED Notes (Signed)
Report to include Situation, Background, Assessment, and Recommendations received from [facility]. Patient alert and oriented, warm and dry, in no acute distress. Patient denies SI, HI, AVH and pain. Patient made aware of Q15 minute rounds and security cameras for their safety. Patient instructed to come to me with needs or concerns.

## 2021-12-26 NOTE — ED Notes (Signed)
Pt given shower supplies, new beh scrubs and new linens. All shower supplies were returned

## 2021-12-26 NOTE — ED Notes (Signed)
Snack and beverage given. 

## 2021-12-26 NOTE — ED Notes (Signed)
Patient up to bathroom, no behavioral issues noted, Patient is calm and cooperative.

## 2021-12-26 NOTE — ED Provider Notes (Signed)
Emergency Medicine Observation Re-evaluation Note  Andre Swander is a 41 y.o. male, seen on rounds today.  Pt initially presented to the ED for complaints of Suicidal and Homicidal  Ambulatory in room no distress.  Reports no concerns.  Tells me his birthday is in 3 days and he is excited about this.  Physical Exam  BP (!) 131/97   Pulse 93   Temp 98 F (36.7 C) (Oral)   Resp 16   Ht '5\' 8"'$  (1.727 m)   Wt 68 kg   SpO2 97%   BMI 22.81 kg/m  Physical Exam General: no distress Lungs: unlabored Psych: no agitation.  Walks with normal gait as he did yesterday  ED Course / MDM  EKG:   I have reviewed the labs performed to date as well as medications administered while in observation.  Recent changes in the last 24 hours include no reported morning events.  Plan   Based on social work    Delman Kitten, MD 12/26/21 [phone]

## 2021-12-26 NOTE — ED Notes (Signed)
Patient ate 100% of breakfast and beverage, no signs of distress,

## 2021-12-26 NOTE — ED Notes (Signed)
Patient ate 100 % of lunch and beverage. Patient is pleasant, no signs of distress.

## 2021-12-27 DIAGNOSIS — F250 Schizoaffective disorder, bipolar type: Secondary | ICD-10-CM | POA: Diagnosis not present

## 2021-12-27 NOTE — ED Notes (Signed)
Pt given lunch tray and drink at this time. 

## 2021-12-27 NOTE — ED Notes (Signed)
Report to include Situation, Background, Assessment, and Recommendations received from  [facility]. Patient alert and oriented, warm and dry, in no acute distress. Patient denies SI, HI, AVH and pain. Patient made aware of Q15 minute rounds and security cameras for their safety. Patient instructed to come to me with needs or concerns.

## 2021-12-27 NOTE — ED Provider Notes (Signed)
Emergency Medicine Observation Re-evaluation Note  Ryan Zuniga is a 41 y.o. male, seen on rounds today.  Pt initially presented to the ED for complaints of Suicidal and Homicidal Currently, the patient is laying on his bed, denies any complaints.  Physical Exam  BP (!) 137/97 (BP Location: Left Arm)   Pulse 69   Temp 98.2 F (36.8 C) (Oral)   Resp 17   Ht '5\' 8"'$  (1.727 m)   Wt 68 kg   SpO2 96%   BMI 22.81 kg/m  Physical Exam Constitutional: Resting comfortably. Eyes: Conjunctivae are normal. Head: Atraumatic. Nose: No congestion/rhinnorhea. Mouth/Throat: Mucous membranes are moist. Neck: Normal ROM Cardiovascular: No cyanosis noted. Respiratory: Normal respiratory effort. Gastrointestinal: Non-distended. Genitourinary: deferred Musculoskeletal: No lower extremity tenderness nor edema. Neurologic:  Normal speech and language. No gross focal neurologic deficits are appreciated. Skin:  Skin is warm, dry and intact. No rash noted.   ED Course / MDM  EKG:   I have reviewed the labs performed to date as well as medications administered while in observation.  Recent changes in the last 24 hours include none.  Plan  Current plan is for dispo per social work.    Blake Divine, MD 12/27/21 1049

## 2021-12-27 NOTE — ED Notes (Signed)
Pt given dinner tray and drink at this time. 

## 2021-12-27 NOTE — ED Notes (Signed)
Patient is vol pending placement 

## 2021-12-27 NOTE — ED Notes (Signed)
Pt to door asking for water cups. Offered to trade cups with him, he refused and went back to room.

## 2021-12-27 NOTE — ED Notes (Signed)
Pt awake, went to bathroom.

## 2021-12-27 NOTE — ED Notes (Signed)
Patient refused VS and snack.

## 2021-12-28 DIAGNOSIS — F250 Schizoaffective disorder, bipolar type: Secondary | ICD-10-CM | POA: Diagnosis not present

## 2021-12-28 NOTE — TOC Progression Note (Signed)
Transition of Care [facility]) - Progression Note    Patient Details  Name: Ryan Zuniga MRN: [id number] Date of Birth: 1981/04/14  [facility] Care [facility]) CM/SW Contact  Shelbie Hutching, RN Phone Number: 12/28/2021, 1:52 PM  Clinical Narrative:    Received a call from York Cerise, [place] with [facility], she will be coming to see patient on Wed.  She has send [facility] with [facility] the requested information for admission.  RNCM sending current med list today.     Expected Discharge Plan: Group Home Barriers to Discharge: Other (must enter comment) (Group home will not take back)  Expected Discharge Plan and Services Expected Discharge Plan: Group Home   Discharge Planning Services: CM Consult   Living arrangements for the past 2 months: Group Home                 DME Arranged: N/A DME Agency: NA       HH Arranged: NA HH Agency: NA         Social Determinants of Health (SDOH) Interventions    Readmission Risk Interventions     No data to display

## 2021-12-28 NOTE — ED Notes (Signed)
VOL/Pending placement 

## 2021-12-28 NOTE — ED Notes (Addendum)
Pt refused dinner tray and drink at this time.

## 2021-12-28 NOTE — ED Notes (Signed)
Pt given snack and drink 

## 2021-12-28 NOTE — ED Provider Notes (Signed)
Emergency Medicine Observation Re-evaluation Note  Ryan Zuniga is a 41 y.o. male, seen on rounds today.  Pt initially presented to the ED for complaints of Suicidal and Homicidal Currently, the patient is resting.  Physical Exam  BP (!) 144/100   Pulse 77   Temp 98.1 F (36.7 C) (Oral)   Resp 18   Ht '5\' 8"'$  (1.727 m)   Wt 68 kg   SpO2 97%   BMI 22.81 kg/m  Physical Exam General: resting no distress Lungs: unlabored breathing Psych: no agitation  ED Course / MDM  EKG:   I have reviewed the labs performed to date as well as medications administered while in observation.  Recent changes in the last 24 hours include no reported morning events.  Plan  Current plan is for sw dispo.    Lucillie Garfinkel, MD 12/28/21 [phone]

## 2021-12-28 NOTE — ED Notes (Signed)
Breakfast tray and beverage provided

## 2021-12-28 NOTE — ED Notes (Signed)
Vol /pending placement 

## 2021-12-28 NOTE — ED Notes (Signed)
Patient refused vitals.

## 2021-12-29 DIAGNOSIS — F250 Schizoaffective disorder, bipolar type: Secondary | ICD-10-CM | POA: Diagnosis not present

## 2021-12-29 NOTE — ED Notes (Signed)
VOL  PENDING  PLACEMENT 

## 2021-12-29 NOTE — ED Notes (Signed)

## 2021-12-29 NOTE — ED Provider Notes (Signed)
Emergency Medicine Observation Re-evaluation Note  Brice Kossman is a 41 y.o. male, initially seen in the emergency department for psychiatric complaint.  No acute events since last update.  Physical Exam  BP (!) 177/98   Pulse 73   Temp 98.2 F (36.8 C) (Oral)   Resp 16   Ht '5\' 8"'$  (1.727 m)   Wt 68 kg   SpO2 98%   BMI 22.81 kg/m    ED Course / MDM   No recent lab work for review.  Plan  Current plan is for placement to an appropriate living facility once available.  Patient is not under IVC.    Harvest Dark, MD 12/29/21 1452

## 2021-12-29 NOTE — ED Notes (Signed)
Vol/Pending Placement

## 2021-12-29 NOTE — ED Notes (Addendum)
Dinner tray and beverage provided. Pt states he only wants coffee but doesn't want anything to eat.

## 2021-12-29 NOTE — ED Notes (Signed)
Breakfast and beverage provided 

## 2021-12-29 NOTE — ED Notes (Signed)
Lunch tray and beverage provided 

## 2021-12-29 NOTE — ED Notes (Signed)
Pt requested phone and when walking back to room another patient sitting in a chair in dayroom wished this pt happy birthday which he responded by yelling for him to leave him alone. Pt heard yelling and cursing while he walked back to his room. Pt then herd on the phone yelling loudly and cursing. When phone returned to nurses station it rang and was 911. Pt had called 911 and said he was going to kill somebody.

## 2021-12-29 NOTE — ED Notes (Signed)
Pt asked to use phone. Appears irritated after bring phone back to this nurse and this nurse asked if something was bothering him. Pt states he is upset with his family. Pt asked if he needs to talk about what was upsetting to him and he said no just to bring the phone to him if his dad calls back. This nurse verbalized understanding.

## 2021-12-29 NOTE — ED Notes (Signed)
Pt father visiting in pt room

## 2021-12-30 DIAGNOSIS — F250 Schizoaffective disorder, bipolar type: Secondary | ICD-10-CM | POA: Diagnosis not present

## 2021-12-30 NOTE — ED Provider Notes (Signed)
Emergency Medicine Observation Re-evaluation Note  Wofford Stratton is a 41 y.o. male, seen on rounds today.  Pt initially presented to the ED for complaints of Suicidal and Homicidal  Currently, the patient is calm, no acute complaints.  Physical Exam  Blood pressure (!) 147/93, pulse 84, temperature 98.9 F (37.2 C), temperature source Oral, resp. rate 16, height '5\' 8"'$  (1.727 m), weight 68 kg, SpO2 98 %. Physical Exam General: NAD Lungs: CTAB Psych: not agitated  ED Course / MDM  EKG:    I have reviewed the labs performed to date as well as medications administered while in observation.  Recent changes in the last 24 hours include no acute events overnight.    Plan  Current plan is for SW dispo. Patient is not under full IVC at this time.   Carrie Mew, MD 12/30/21 [phone]

## 2021-12-30 NOTE — ED Notes (Signed)
Breakfast given.  

## 2021-12-30 NOTE — ED Notes (Signed)
Pt given snack. 

## 2021-12-30 NOTE — ED Notes (Signed)
Case manager speaking with pt in day room. Pt calm and cooperative

## 2021-12-30 NOTE — ED Notes (Signed)
Report off to kim rn  

## 2021-12-30 NOTE — ED Notes (Signed)
Vol /pending placement 

## 2021-12-30 NOTE — ED Notes (Signed)
VOl pending placement

## 2021-12-30 NOTE — ED Notes (Signed)
Pt alert, pt resting quietly in room.  Pt had dinner meal

## 2021-12-31 DIAGNOSIS — F250 Schizoaffective disorder, bipolar type: Secondary | ICD-10-CM | POA: Diagnosis not present

## 2021-12-31 NOTE — ED Notes (Signed)
Hospital meal provided, pt tolerated w/o complaints.  Waste discarded appropriately.  

## 2021-12-31 NOTE — ED Notes (Signed)
Report to include Situation, Background, Assessment, and Recommendations received from [facility]. Patient alert and oriented, warm and dry, in no acute distress. Patient denies SI, HI, AVH and pain. Patient made aware of Q15 minute rounds and security cameras for their safety. Patient instructed to come to me with needs or concerns.

## 2021-12-31 NOTE — ED Notes (Signed)
Offered pt dinner tray- pt did not want it at this time

## 2021-12-31 NOTE — ED Notes (Signed)
Patient is calm and cooperative, took all po medications for morning, will continue too monitor, no behavioral issues noted. Patient watching tv in His room.

## 2021-12-31 NOTE — ED Notes (Signed)
Patient denies Si/hi or avh, no signs of distress, will continue to monitor. Patient with q 15 minute checks and camera surveillance in progress for safety.

## 2021-12-31 NOTE — ED Notes (Signed)
Vol /pending placement 

## 2021-12-31 NOTE — ED Notes (Signed)
Snack and beverage given. 

## 2021-12-31 NOTE — ED Notes (Signed)
Patient ate 100% of lunch and beverage, no signs of distress.  

## 2022-01-01 DIAGNOSIS — F250 Schizoaffective disorder, bipolar type: Secondary | ICD-10-CM | POA: Diagnosis not present

## 2022-01-01 NOTE — ED Notes (Signed)
VOL  PENDING  PLACEMENT 

## 2022-01-01 NOTE — ED Notes (Signed)
Snack and drink given

## 2022-01-01 NOTE — ED Provider Notes (Signed)
Emergency Medicine Observation Re-evaluation Note  Ryan Zuniga is a 41 y.o. male, seen on rounds today.   Physical Exam  BP (!) 130/94   Pulse 71   Temp 98.1 F (36.7 C)   Resp 20   Ht '5\' 8"'$  (1.727 m)   Wt 68 kg   SpO2 97%   BMI 22.81 kg/m  Physical Exam General: NAD   ED Course / MDM  EKG:   I have reviewed the labs performed to date as well as medications administered while in observation.    Plan  Current plan is for sw.    [name], MD 01/01/22 [phone]

## 2022-01-01 NOTE — ED Notes (Signed)
Nurse offered Patient lunch tray with beverage for some of his cups(trash) and He refused to give up trash, and refused lunch.

## 2022-01-01 NOTE — ED Notes (Signed)
Patient is calm and cooperative, gave him snack and beverage, will continue to monitor.

## 2022-01-01 NOTE — ED Notes (Signed)
Report to include Situation, Background, Assessment, and Recommendations received from [place] RN. Patient alert and oriented, warm and dry, in no acute distress. Patient denies SI, HI, AVH and pain. Patient made aware of Q15 minute rounds and security cameras for their safety. Patient instructed to come to me with needs or concerns.

## 2022-01-01 NOTE — ED Notes (Signed)
Patient came to door and ask for drink, nurse obtained for him and let him know that she would be picking up trash in His room at lunch time and He said " Ok" Patient is calm and cooperative.

## 2022-01-01 NOTE — TOC Progression Note (Signed)
Transition of Care [facility]) - Progression Note    Patient Details  Name: Ryan Zuniga MRN: [id number] Date of Birth: July 15, 1980  Transition of Care [facility]) CM/SW Contact  Shelbie Hutching, RN Phone Number: 01/01/2022, 3:18 PM  Clinical Narrative:    Planned admission to [facility] group home still tentatively scheduled for 9/28.   Expected Discharge Plan: Group Home Barriers to Discharge: Other (must enter comment) (Group home will not take back)  Expected Discharge Plan and Services Expected Discharge Plan: Group Home   Discharge Planning Services: CM Consult   Living arrangements for the past 2 months: Group Home                 DME Arranged: N/A DME Agency: NA       HH Arranged: NA HH Agency: NA         Social Determinants of Health (SDOH) Interventions    Readmission Risk Interventions     No data to display

## 2022-01-01 NOTE — ED Notes (Signed)
Pt's mother called wanting to talk with him. Pt notified and mother's number given to him. Pt given hospital phone to briefly call her if desired. Pt appreciative.

## 2022-01-01 NOTE — ED Notes (Signed)
VOL/pending placement 

## 2022-01-02 DIAGNOSIS — F250 Schizoaffective disorder, bipolar type: Secondary | ICD-10-CM | POA: Diagnosis not present

## 2022-01-02 NOTE — ED Notes (Signed)
VOl placement pending 9/28

## 2022-01-02 NOTE — ED Notes (Signed)
Refused vitals signs and snack

## 2022-01-02 NOTE — ED Notes (Signed)
Report to include Situation, Background, Assessment, and Recommendations received from [employer]. Patient alert and oriented, warm and dry, in no acute distress. Patient denies SI, HI, AVH and pain. Patient made aware of Q15 minute rounds and security cameras for their safety. Patient instructed to come to me with needs or concerns.

## 2022-01-02 NOTE — ED Notes (Signed)
Pt came to door. Apologized for his mood this morning and now he is willing to take his medications.

## 2022-01-03 DIAGNOSIS — F250 Schizoaffective disorder, bipolar type: Secondary | ICD-10-CM | POA: Diagnosis not present

## 2022-01-03 NOTE — ED Notes (Signed)
Pt refused breakfast and vitals.

## 2022-01-03 NOTE — ED Notes (Signed)
Pt given nighttime snack. 

## 2022-01-03 NOTE — ED Notes (Signed)
Report to include Situation, Background, Assessment, and Recommendations received from [employer]. Patient alert and oriented, warm and dry, in no acute distress. Patient denies SI, HI, AVH and pain. Patient made aware of Q15 minute rounds and security cameras for their safety. Patient instructed to come to me with needs or concerns.

## 2022-01-03 NOTE — ED Notes (Signed)
VOL/pending placement 

## 2022-01-03 NOTE — ED Notes (Signed)
Pt has cups sitting all around room. This Probation officer asked pt to throw them away before snack time and pt said okay. Writer asked pt again to throw cups away while passing around snacks and pt refused, stating "I'm good".

## 2022-01-03 NOTE — ED Notes (Signed)
Patient is vol pending placement 

## 2022-01-03 NOTE — ED Notes (Addendum)
Pt provided with dinner tray.

## 2022-01-03 NOTE — ED Provider Notes (Signed)
Emergency Medicine Observation Re-evaluation Note  Ryan Zuniga is a 41 y.o. male, seen on rounds today.  Physical Exam  BP (!) 141/88 (BP Location: Left Arm)   Pulse 77   Temp 98 F (36.7 C) (Oral)   Resp 18   Ht '5\' 8"'$  (1.727 m)   Wt 68 kg   SpO2 94%   BMI 22.81 kg/m  Physical Exam General: NAd  ED Course / MDM  EKG:   I have reviewed the labs performed to date as well as medications administered while in observation.  Recent changes in the last 24 hours include .  Plan  Current plan is for TOC.    [name], MD 01/03/22 [phone]

## 2022-01-04 DIAGNOSIS — F250 Schizoaffective disorder, bipolar type: Secondary | ICD-10-CM | POA: Diagnosis not present

## 2022-01-04 NOTE — ED Notes (Signed)
Patient ate 100% of supper and beverage. No signs of distress.

## 2022-01-04 NOTE — ED Notes (Signed)
Vol /pending placement 

## 2022-01-04 NOTE — ED Notes (Signed)
Patient is up and ask for coffee, and nurse did get him 1 cup of decaf. Coffee cooled down and He gave nurse 9 of his cups, He is pleasant and cooperative, no behavioral issues noted. Nurse will continue to monitor.

## 2022-01-04 NOTE — ED Provider Notes (Signed)
Emergency Medicine Observation Re-evaluation Note  Ryan Zuniga is a 41 y.o. male, seen on rounds today.  Pt initially presented to the ED for complaints of Suicidal and Homicidal Currently, the patient is resting comfortably in his room.  Physical Exam  BP (!) 135/95   Pulse 100   Temp 98.1 F (36.7 C) (Oral)   Resp 18   Ht '5\' 8"'$  (1.727 m)   Wt 68 kg   SpO2 96%   BMI 22.81 kg/m    ED Course / MDM   I have reviewed the labs performed to date as well as medications administered while in observation.  There have been no significant changes in the last 24 hours.  Plan  Current plan is for social work placement.    Arta Silence, MD 01/04/22 [phone]

## 2022-01-05 DIAGNOSIS — F250 Schizoaffective disorder, bipolar type: Secondary | ICD-10-CM | POA: Diagnosis not present

## 2022-01-05 MED ORDER — VITAMIN D 25 MCG (1000 UNIT) PO TABS
1000.0000 [IU] | ORAL_TABLET | Freq: Every day | ORAL | Status: DC
  Administered 2022-01-05 – 2022-01-31 (×24): 1000 [IU] via ORAL
  Filled 2022-01-05 (×29): qty 1

## 2022-01-05 NOTE — ED Notes (Signed)
Pt refuses to talk to staff, tells staff not to talk to him. Pt room checked and is clean, refuses vitals.

## 2022-01-05 NOTE — ED Notes (Signed)
VOL  PENDING  PLACEMENT 

## 2022-01-05 NOTE — ED Notes (Signed)
No snack provided to pt due to behaviors

## 2022-01-05 NOTE — ED Notes (Signed)
Breakfast tray, coffee, and milk provided

## 2022-01-05 NOTE — ED Notes (Signed)
Pt given dinner tray. Pt apologized for cursing at staff. soda given to pt.

## 2022-01-05 NOTE — ED Notes (Signed)
Report received from Seth Bake, Conservation officer, nature. Patient alert and oriented, warm and dry, in no acute distress. Patient annoyed and refuses to speak to staff. Patient made aware of Q15 minute rounds and security cameras for their safety. Patient instructed to come to this nurse with needs or concerns.

## 2022-01-05 NOTE — ED Notes (Signed)
Pt continues poor mood, refuses meds

## 2022-01-05 NOTE — ED Notes (Addendum)
Dinner and soda offered to pt after pt disposed on trash in room. Pt cursing loudly and states he does not have any trash. 7 cups found in room along with a meal container. Pt cursing and yelling loudly calling this nurse and security officer "you bitch" and "fuck you" while giving middle finger to staff. Pt yelled loudly "those are my cups and not trash" pt refused dinner and beverage.

## 2022-01-05 NOTE — ED Notes (Signed)
VOL/Pending Placement 

## 2022-01-05 NOTE — ED Notes (Addendum)
Lunch tray and soda provided

## 2022-01-05 NOTE — ED Provider Notes (Signed)
Emergency Medicine Observation Re-evaluation Note  Ryan Zuniga is a 41 y.o. male, seen on rounds today.  Pt initially presented to the ED for complaints of Suicidal and Homicidal Currently, the patient is awaiting social work placement.   Physical Exam  BP 123/82 (BP Location: Left Arm)   Pulse 78   Temp 98.2 F (36.8 C) (Oral)   Resp 16   Ht '5\' 8"'$  (1.727 m)   Wt 68 kg   SpO2 95%   BMI 22.81 kg/m  Physical Exam General: Calm  ED Course / MDM  EKG:   I have reviewed the labs performed to date as well as medications administered while in observation.    Plan  Current plan is for social work placement.    Nance Pear, MD 01/05/22 [phone]

## 2022-01-06 DIAGNOSIS — F250 Schizoaffective disorder, bipolar type: Secondary | ICD-10-CM | POA: Diagnosis not present

## 2022-01-06 NOTE — ED Notes (Signed)
Pt given snack. 

## 2022-01-06 NOTE — ED Notes (Signed)
Pt given snack and beverage. 

## 2022-01-06 NOTE — ED Notes (Signed)
Patient ate 100% of lunch and beverage.

## 2022-01-06 NOTE — ED Notes (Signed)
Breakfast given.  

## 2022-01-06 NOTE — ED Notes (Signed)
Vol /pending placement 

## 2022-01-06 NOTE — ED Provider Notes (Signed)
Emergency Medicine Observation Re-evaluation Note  Ryan Zuniga is a 41 y.o. male, seen on rounds today.  Pt initially presented to the ED for complaints of Suicidal and Homicidal Currently, the patient is resting comfortably in his room.  Physical Exam  BP (!) 156/92   Pulse 78   Temp 97.7 F (36.5 C) (Oral)   Resp 14   Ht '5\' 8"'$  (1.727 m)   Wt 68 kg   SpO2 98%   BMI 22.81 kg/m  Physical Exam General: No distress Psych: Calm and cooperative  ED Course / MDM   I have reviewed the labs performed to date as well as medications administered while in observation.  There have been no changes to his status in the last 24 hours although the patient had an episode of yelling at staff yesterday and declines medication.  He is now calm.  Plan  Current plan is for social work placement.    Arta Silence, MD 01/06/22 1220

## 2022-01-06 NOTE — ED Notes (Signed)

## 2022-01-06 NOTE — ED Notes (Signed)
Patient up to the door, wanted to go to restroom, He is smiling and appears in good mood, will continue to monitor. Patient is safe, no Si/hi or avh noted.

## 2022-01-06 NOTE — ED Notes (Signed)
Patient received dinner tray and ate 100% and beverage.

## 2022-01-07 DIAGNOSIS — F250 Schizoaffective disorder, bipolar type: Secondary | ICD-10-CM | POA: Diagnosis not present

## 2022-01-07 NOTE — ED Notes (Signed)
Pt refusing VS and breakfast at this time.

## 2022-01-07 NOTE — ED Notes (Signed)
Patient refused to talk, he refused vitals signs. Support and encouragement provided. Q 15 minutes safety checks ongoing. Patient remains safe.

## 2022-01-07 NOTE — ED Notes (Signed)
Pt refusing meds at this time

## 2022-01-07 NOTE — ED Notes (Signed)
Dinner tray given

## 2022-01-07 NOTE — ED Provider Notes (Signed)
Emergency Medicine Observation Re-evaluation Note  Ryan Zuniga is a 41 y.o. male, seen on rounds today.  Pt initially presented to the ED for complaints of Suicidal and Homicidal  Currently, the patient is no acute distress.  Pt asleep in bed-I discussed with the [place] and no issues overnight. Physical Exam  Blood pressure (!) 140/89, pulse 85, temperature 98.7 F (37.1 C), temperature source Oral, resp. rate 16, height '5\' 8"'$  (1.727 m), weight 68 kg, SpO2 95 %.  Physical Exam General: No apparent distress Pulm: Normal WOB Psych: resting     ED Course / MDM     I have reviewed the labs performed to date as well as medications administered while in observation.  Recent changes in the last 24 hours  include none   Plan   Current plan is to continue to wait for placement   Patient is not under full IVC at this time.   Vanessa Orient, MD 01/07/22 [phone]

## 2022-01-08 DIAGNOSIS — F250 Schizoaffective disorder, bipolar type: Secondary | ICD-10-CM | POA: Diagnosis not present

## 2022-01-08 NOTE — ED Notes (Signed)
VOL/pending placement 

## 2022-01-08 NOTE — ED Notes (Signed)
Offered hospital meal to pt. Pt did not want his dinner. Pt offered a sandwich tray and pt ate sandwich tray instead.

## 2022-01-08 NOTE — ED Notes (Signed)
Patient ate 100% of lunch and beverage, no signs of distress.  

## 2022-01-08 NOTE — ED Notes (Signed)
Report to include Situation, Background, Assessment, and Recommendations received from [facility]. Patient alert and oriented, warm and dry, in no acute distress. Patient denies SI, HI, AVH and pain. Patient made aware of Q15 minute rounds and security cameras for their safety. Patient instructed to come to me with needs or concerns.

## 2022-01-08 NOTE — ED Notes (Signed)
Patient refused to take his mediation.

## 2022-01-09 DIAGNOSIS — F250 Schizoaffective disorder, bipolar type: Secondary | ICD-10-CM | POA: Diagnosis not present

## 2022-01-09 NOTE — ED Notes (Signed)
VOL/pending placement 

## 2022-01-09 NOTE — ED Notes (Signed)
Report to include Situation, Background, Assessment, and Recommendations received from [employer]. Patient alert and oriented, warm and dry, in no acute distress. Patient denies SI, HI, AVH and pain. Patient made aware of Q15 minute rounds and security cameras for their safety. Patient instructed to come to me with needs or concerns.

## 2022-01-09 NOTE — ED Notes (Signed)
Lunch tray given. 

## 2022-01-09 NOTE — ED Notes (Signed)
Snack and beverage given. 

## 2022-01-09 NOTE — ED Notes (Signed)
Hospital meal provided, pt tolerated w/o complaints.  Waste discarded appropriately.  

## 2022-01-09 NOTE — ED Notes (Signed)
Karlon got mad after we told him he couldn't have coffee and more beverage after he just received lunch. He got very upset and yelled at staff "Fuck you. Fuck that coffee. I wish you would die. I am going to kill you" and then stormed back to his room.

## 2022-01-09 NOTE — ED Notes (Signed)
Vol/ Pending Placement

## 2022-01-09 NOTE — ED Provider Notes (Signed)
Emergency Medicine Observation Re-evaluation Note  Ryan Zuniga is a 41 y.o. male, seen on rounds today.  Pt initially presented to the ED for complaints of Suicidal and Homicidal Currently, the patient is sleeping  Physical Exam  BP (!) 138/97 (BP Location: Right Arm)   Pulse 77   Temp 97.8 F (36.6 C) (Oral)   Resp 20   Ht '5\' 8"'$  (1.727 m)   Wt 68 kg   SpO2 97%   BMI 22.81 kg/m  Physical Exam General: no distress, sleeping Lungs: no increased wob Psych: no agitation  ED Course / MDM  EKG:   I have reviewed the labs performed to date as well as medications administered while in observation.  Recent changes in the last 24 hours include nonw.  Plan  Current plan is for SW disposition.    Rada Hay, MD 01/09/22 [phone]

## 2022-01-10 DIAGNOSIS — F250 Schizoaffective disorder, bipolar type: Secondary | ICD-10-CM | POA: Diagnosis not present

## 2022-01-10 NOTE — ED Notes (Signed)
Snack and drink given

## 2022-01-10 NOTE — ED Notes (Signed)
Pt taking shower. Pt was given hygiene items and the following, 1 clean top, 1 clean bottom, with 1 pair of disposable underwear.  Pt changed out into clean clothing.  Staff disposed of all shower supplies.   

## 2022-01-10 NOTE — ED Notes (Signed)
Pt refused breakfast tray.  

## 2022-01-10 NOTE — ED Notes (Signed)
Pt refusing meds at this time

## 2022-01-10 NOTE — ED Notes (Signed)
Pt refusing VS at this time.

## 2022-01-11 DIAGNOSIS — F250 Schizoaffective disorder, bipolar type: Secondary | ICD-10-CM | POA: Diagnosis not present

## 2022-01-11 MED ORDER — LORAZEPAM 2 MG/ML IJ SOLN
2.0000 mg | Freq: Once | INTRAMUSCULAR | Status: AC
  Administered 2022-01-11: 2 mg via INTRAMUSCULAR
  Filled 2022-01-11: qty 1

## 2022-01-11 MED ORDER — HALOPERIDOL LACTATE 5 MG/ML IJ SOLN
5.0000 mg | Freq: Once | INTRAMUSCULAR | Status: AC
  Administered 2022-01-11: 5 mg via INTRAMUSCULAR
  Filled 2022-01-11: qty 1

## 2022-01-11 NOTE — ED Notes (Signed)
This nurse to pt room and offered his coffee, morning medications, and asked to get pt vital signs. Pt doors to his room are blocked by his mattress which is on the floor across doorway and blanket over doors and mattress. Pt had been seen pacing in room and frequently showing his middle finger to the security cameras in his room. Pt cursed loudly at this nurse and said "I don't want it fuck you bitch". This nurse asked pt if he was sure he didn't want his coffee and he responded back that he wanted the coffee and nothing else. Pt informed that he needed to get his vitals taken and medications given prior to getting his coffee. Pt repeats "fuck you bitch I want my coffee" multiple times loudly while banging on wall and window in room. MD made aware of behavior. Orders received.

## 2022-01-11 NOTE — ED Notes (Signed)
Pt given dinner tray and drink at this time. 

## 2022-01-11 NOTE — ED Notes (Signed)
Rounding on patient and asked if he was ok and patient responded by saying "leave me the f**k alone bitch".  Patient otherwise not being loud or aggressive in room.

## 2022-01-11 NOTE — ED Notes (Signed)
Pt given snack. 

## 2022-01-11 NOTE — ED Notes (Signed)
Pt intermittently pacing in room with mattress pushed across doorway behind his doors. Chair from room pushed against mattress as well. Pt appears slightly less agitated.

## 2022-01-11 NOTE — TOC Progression Note (Addendum)
Transition of Care [facility]) - Progression Note    Patient Details  Name: Shlomie Romig MRN: [id number] Date of Birth: 11/05/1980  Transition of Care [facility]) CM/SW [place], [place] Phone Number: 01/11/2022, 3:52 PM  Clinical Narrative:     CSW received email from [place] with [facility] stating that she completed patient's Person-Centered Profile, which is a required step for his transition into the group home.  This requires the signature of a medical provider (MD, DO, PhD,NP, or PA);   CSW has sent this document to Dr. Cinda Quest to sign.  Update: document signed and emailed back to [place].   Expected Discharge Plan: Group Home Barriers to Discharge: Other (must enter comment) (Group home will not take back)  Expected Discharge Plan and Services Expected Discharge Plan: Group Home   Discharge Planning Services: CM Consult   Living arrangements for the past 2 months: Group Home                 DME Arranged: N/A DME Agency: NA       HH Arranged: NA HH Agency: NA         Social Determinants of Health (SDOH) Interventions    Readmission Risk Interventions     No data to display

## 2022-01-11 NOTE — ED Notes (Signed)
Patient noted on camera to be pacing in room, placed mattress in doorway and looks at camera and gives the middle finger.

## 2022-01-11 NOTE — ED Provider Notes (Addendum)
Emergency Medicine Observation Re-evaluation Note  Ryan Zuniga is a 41 y.o. male, seen on rounds today.    Physical Exam  BP 136/86 (BP Location: Left Arm)   Pulse 80   Temp 97.9 F (36.6 C) (Oral)   Resp 16   Ht '5\' 8"'$  (1.727 m)   Wt 68 kg   SpO2 94%   BMI 22.81 kg/m  Physical Exam General: Patient resting comfortably in bed Lungs: Patient not in respiratory distress Psych: Patient not combative  ED Course / MDM  EKG:     Plan  Current plan is for patient for social work placement.  He still under IVC.Nena Polio, MD 01/11/22 0754 ----------------------------------------- 8:54 AM on 01/11/2022 ----------------------------------------- Patient is now becoming disruptive starting to yell has put his mattress in the doorway to block the doorway.  He is refusing his medicines we will have to medicate him.  I have ordered some IM Haldol and Ativan for him.   Nena Polio, MD 01/11/22 [phone]

## 2022-01-12 DIAGNOSIS — F250 Schizoaffective disorder, bipolar type: Secondary | ICD-10-CM | POA: Diagnosis not present

## 2022-01-12 NOTE — ED Notes (Signed)
Vol /pending placement 

## 2022-01-12 NOTE — ED Notes (Signed)
Pt seen on security camera putting mattress across doorway along with blankets and a pillow.

## 2022-01-12 NOTE — ED Notes (Signed)
Pt given breakfast tray and drink at this time. 

## 2022-01-12 NOTE — ED Notes (Signed)
Provided pt with dinner tray and beverage. Pt refused and asked for just coffee. Pt asked to throw away his trays from earlier in the day and pt refused.

## 2022-01-12 NOTE — ED Notes (Signed)
VOL/Pending Placement 

## 2022-01-12 NOTE — ED Notes (Signed)
Pt up to nurses station asking for something to drink. Pt has not taken any trash out of his room today so pt asked for some cups and meal trays to throw away while someone gets his drink. Pt asked to use restroom instead and was seen walking back to his room without cups. Pt took cups off the floor of his room and put them next to the chair and covered the door with a  blanket. Pt then seen sitting on chair in room looking at the television.

## 2022-01-13 DIAGNOSIS — F250 Schizoaffective disorder, bipolar type: Secondary | ICD-10-CM | POA: Diagnosis not present

## 2022-01-13 NOTE — ED Notes (Signed)
Report received from [place], Conservation officer, nature. Patient alert and oriented, warm and dry, in no acute distress. Patient denies SI, HI, AVH and pain. Patient made aware of Q15 minute rounds and security cameras for their safety. Patient instructed to come to this nurse with needs or concerns.

## 2022-01-13 NOTE — ED Notes (Signed)
Pt provided with lunch tray and coffee.

## 2022-01-13 NOTE — ED Notes (Signed)
Pt to nursing station requesting Sprite. When asked to trade old cup for new cup, but changed his mind, stating, "yeah, I'm good." Pt ambulated back to room without incident.

## 2022-01-13 NOTE — ED Notes (Signed)
This nurse cleared out pts room as he had 10 cups of water on the floor and 2 dinner trays around 1930. Pt is now refusing vitals to be collected and refusing snack

## 2022-01-13 NOTE — ED Notes (Signed)
Pt is refusing to speak to staff, has put mattress in the floor at door. Refusing his night time medicaiton, will give it if he asks later as long as it is at a reasonable time with due time.

## 2022-01-13 NOTE — ED Provider Notes (Signed)
Emergency Medicine Observation Re-evaluation Note  Ryan Zuniga is a 41 y.o. male, seen on rounds today.  Pt initially presented to the ED for complaints of Suicidal and Homicidal Currently, the patient is resting comfortably.  Physical Exam  BP 121/88 (BP Location: Left Arm)   Pulse 75   Temp 98.1 F (36.7 C) (Oral)   Resp 17   Ht '5\' 8"'$  (1.727 m)   Wt 68 kg   SpO2 98%   BMI 22.81 kg/m  Physical Exam General: No acute distress Cardiac: Well-perfused extremities Lungs: No respiratory distress Psych: Appropriate mood and affect  ED Course / MDM  EKG:   I have reviewed the labs performed to date as well as medications administered while in observation.  Recent changes in the last 24 hours include none.  Plan  Current plan is for placement.    Naaman Plummer, MD 01/13/22 1537

## 2022-01-13 NOTE — ED Notes (Signed)
VOL  PENDING  PLACEMENT 

## 2022-01-14 DIAGNOSIS — F250 Schizoaffective disorder, bipolar type: Secondary | ICD-10-CM | POA: Diagnosis not present

## 2022-01-14 NOTE — TOC Progression Note (Signed)
Transition of Care [facility]) - Progression Note    Patient Details  Name: Araf Clugston MRN: [id number] Date of Birth: February 20, 1981  Transition of Care [facility]) CM/SW Contact  Shelbie Hutching, RN Phone Number: 01/14/2022, 5:23 PM  Clinical Narrative:    Per York Cerise the [place] Coordinator they have an admission date of 02/01/22.  That will allow time for a specialist to write a Behavior Support Plan and train group home staff in order to best serve patient.     Expected Discharge Plan: Group Home Barriers to Discharge: Other (must enter comment) (Group home will not take back)  Expected Discharge Plan and Services Expected Discharge Plan: Group Home   Discharge Planning Services: CM Consult   Living arrangements for the past 2 months: Group Home                 DME Arranged: N/A DME Agency: NA       HH Arranged: NA HH Agency: NA         Social Determinants of Health (SDOH) Interventions    Readmission Risk Interventions     No data to display

## 2022-01-14 NOTE — ED Notes (Signed)
Pt given snack. 

## 2022-01-14 NOTE — ED Notes (Signed)
VOL/pending placement 

## 2022-01-14 NOTE — ED Notes (Signed)
Report to include Situation, Background, Assessment, and Recommendations received from [employer]. Patient alert and oriented, warm and dry, in no acute distress. Patient denies SI, HI, AVH and pain. Patient made aware of Q15 minute rounds and security cameras for their safety. Patient instructed to come to me with needs or concerns.

## 2022-01-14 NOTE — ED Notes (Signed)
Snack and beverage given. 

## 2022-01-15 DIAGNOSIS — F250 Schizoaffective disorder, bipolar type: Secondary | ICD-10-CM | POA: Diagnosis not present

## 2022-01-15 NOTE — ED Provider Notes (Signed)
Emergency Medicine Observation Re-evaluation Note  Ryan Zuniga is a 41 y.o. male, in the emergency department for psychiatric complaint.  No acute events since last update.  Physical Exam  BP 130/86 (BP Location: Right Arm)   Pulse 72   Temp 98 F (36.7 C) (Oral)   Resp 20   Ht '5\' 8"'$  (1.727 m)   Wt 68 kg   SpO2 97%   BMI 22.81 kg/m    ED Course / MDM   No new lab work for review  Plan  Current plan is for placement to an appropriate living facility once available.    Harvest Dark, MD 01/15/22 [phone]

## 2022-01-15 NOTE — ED Notes (Signed)
Vol/Pending placement

## 2022-01-15 NOTE — ED Notes (Signed)
VOL/pending placement 

## 2022-01-15 NOTE — ED Notes (Signed)
Pt up to bathroom and requesting drink. This RN explaining he can have water until breakfast gets here and needs to provide a cup from room. Pt stormed back to room mumbling under breath.

## 2022-01-15 NOTE — ED Notes (Signed)
Snack and drink given

## 2022-01-15 NOTE — ED Notes (Signed)
Pt refused vitals 

## 2022-01-15 NOTE — ED Notes (Signed)
NT had pt to exchange an empty cup for his snack beverage.  Pt currently has 2 cups in his room.

## 2022-01-16 DIAGNOSIS — F250 Schizoaffective disorder, bipolar type: Secondary | ICD-10-CM | POA: Diagnosis not present

## 2022-01-16 NOTE — ED Notes (Signed)
Snack and drink given

## 2022-01-16 NOTE — ED Notes (Signed)
This tech attempted to bring pt breakfast. Pt initially requested coffee, however pt is now allowed to have coffee according to the behavior coaching plan in the [place] so this tech brought pt sprite instead. When this tech asked pt for a cup in return for a cup of sprite, pt slammed doors closed and stated "I am not hungry". This tech advised pt to let staff know if/when he becomes hungry.

## 2022-01-16 NOTE — ED Notes (Signed)
Pt refused his meds even after several attempts by staff.

## 2022-01-16 NOTE — ED Notes (Signed)
VOL/Pending Placement 

## 2022-01-16 NOTE — ED Notes (Signed)
Attempted assessment on pt and he refused to speak with me only repeatedly stating "I'm good". Refused his medications at this time. Will attempt later and see if he is agreeable to taking it. Right now pt in his room with a blanket over his door.

## 2022-01-16 NOTE — ED Provider Notes (Signed)
Emergency Medicine Observation Re-evaluation Note  Ryan Zuniga is a 41 y.o. male, seen on rounds today.  Pt initially presented to the ED for complaints of Suicidal and Homicidal Currently, the patient is calm, in NAD. Marland Kitchen  Physical Exam  BP 124/88   Pulse 73   Temp (!) 97.5 F (36.4 C)   Resp 20   Ht '5\' 8"'$  (1.727 m)   Wt 68 kg   SpO2 97%   BMI 22.81 kg/m    ED Course / MDM  EKG:   I have reviewed the labs performed to date as well as medications administered while in observation.  Recent changes in the last 24 hours include none.  Plan  Current plan is for TTS disposition.    Duffy Bruce, MD 01/16/22 [phone]

## 2022-01-16 NOTE — ED Notes (Signed)
Pt given dinner tray at this time, states he is willing to eat his dinner.

## 2022-01-16 NOTE — ED Notes (Signed)
VOL pending placement possible 10/16

## 2022-01-17 DIAGNOSIS — F250 Schizoaffective disorder, bipolar type: Secondary | ICD-10-CM | POA: Diagnosis not present

## 2022-01-17 NOTE — ED Notes (Signed)
VOL/pending placement 

## 2022-01-17 NOTE — ED Notes (Signed)
Patient is vol pending placement 

## 2022-01-17 NOTE — ED Notes (Signed)
Pt. Alert and oriented, warm and dry, in no distress. Pt. Denies SI, HI, and AVH. Patient helped nurse pick up cups in room to me removed. Pt. Encouraged to let nursing staff know of any concerns or needs.

## 2022-01-17 NOTE — ED Notes (Signed)
Pt given supplies to shower. And he changed the sheets on his bed.

## 2022-01-17 NOTE — ED Notes (Signed)
Pt requested room to be mopped. EVS came and swept and mopped his room.

## 2022-01-17 NOTE — ED Provider Notes (Signed)
Emergency Medicine Observation Re-evaluation Note  Physical Exam   BP (!) 136/94 (BP Location: Right Arm)   Pulse 72   Temp 97.6 F (36.4 C) (Oral)   Resp 20   Ht '5\' 8"'$  (1.727 m)   Wt 68 kg   SpO2 97%   BMI 22.81 kg/m   Pt is calm and resting, respirations unlabored, and appears comfortable.   ED Course / MDM   No reported events during my shift at the time of this note.   Pt is awaiting dispo from psych   Lucillie Garfinkel MD    Lucillie Garfinkel, MD 01/17/22 [phone]

## 2022-01-17 NOTE — ED Notes (Signed)
Breakfast tray provided. 

## 2022-01-17 NOTE — ED Notes (Signed)
Pt given lunch tray at this time

## 2022-01-17 NOTE — ED Notes (Signed)
Snack and drink given

## 2022-01-18 DIAGNOSIS — F250 Schizoaffective disorder, bipolar type: Secondary | ICD-10-CM | POA: Diagnosis not present

## 2022-01-18 NOTE — ED Notes (Signed)
VOL  PENDING  PLACEMENT 

## 2022-01-18 NOTE — ED Notes (Signed)
Pt given snack, no other needs at the moment.

## 2022-01-18 NOTE — ED Notes (Signed)
Snack and beverage provided  

## 2022-01-18 NOTE — ED Notes (Signed)

## 2022-01-18 NOTE — ED Notes (Signed)
Pt asked to use phone and use restroom. Pt seen taking cup into the restroom and stopped by Animal nutritionist. Pt cursing at security officer and yelling that he was just getting some water. Pt seen quickly walking back to his room while yelling loudly that he was going to pee in his cups and throw them at the officer. Pt encouraged to calm down and not yell.

## 2022-01-18 NOTE — ED Provider Notes (Signed)
Emergency Medicine Observation Re-evaluation Note  Ryan Zuniga is a 41 y.o. male, seen on rounds today.  Pt initially presented to the ED for complaints of Suicidal and Homicidal Currently, the patient is resting.  Physical Exam  BP (!) 144/98 (BP Location: Left Arm)   Pulse 79   Temp 98.6 F (37 C) (Oral)   Resp 18   Ht '5\' 8"'$  (1.727 m)   Wt 68 kg   SpO2 94%   BMI 22.81 kg/m  Physical Exam Constitutional:      Appearance: He is not ill-appearing or toxic-appearing.  Pulmonary:     Effort: Pulmonary effort is normal.  Musculoskeletal:        General: No deformity.  Psychiatric:     Comments: No emotional distress      ED Course / MDM  EKG:   I have reviewed the labs performed to date as well as medications administered while in observation.  Recent changes in the last 24 hours include none.  Plan  Current plan is for placement.    [name], MD 01/18/22 [phone]

## 2022-01-18 NOTE — ED Notes (Signed)
VOL/pending placement 

## 2022-01-18 NOTE — ED Notes (Signed)
Meal tray and beverage provided.

## 2022-01-19 DIAGNOSIS — F250 Schizoaffective disorder, bipolar type: Secondary | ICD-10-CM | POA: Diagnosis not present

## 2022-01-19 NOTE — ED Notes (Signed)
Lunch tray given to pt.

## 2022-01-19 NOTE — ED Notes (Signed)
Patient ate 100% of supper and beverage, no signs of distress.  

## 2022-01-19 NOTE — ED Notes (Signed)
Patient is calm and cooperative, talking to Dad on the phone, Nurse will continue too monitor.

## 2022-01-19 NOTE — ED Notes (Signed)
Pt continues to refuse vitals and communication with team. No snack to be given as pt it noncompliant at this time

## 2022-01-19 NOTE — ED Notes (Signed)
Pt is refusing medications, repeatedly stating "I good"

## 2022-01-19 NOTE — ED Notes (Signed)
Dinner tray given to pt

## 2022-01-19 NOTE — ED Notes (Signed)
Report received from Abigail Butts, Conservation officer, nature. Patient alert and oriented, warm and dry, in no acute distress. Patient refuses to talk to staff and requests to be left alone, refuses collection of vitals. Patient made aware of Q15 minute rounds and security cameras for their safety. Patient instructed to come to this nurse with needs or concerns.

## 2022-01-19 NOTE — ED Notes (Signed)
Patient ate 100% of breakfast and beverage.

## 2022-01-19 NOTE — ED Notes (Signed)
Vol /pending placement 

## 2022-01-19 NOTE — ED Provider Notes (Signed)
Emergency Medicine Observation Re-evaluation Note  Ryan Zuniga is a 41 y.o. male, seen on rounds today.  Pt initially presented to the ED for complaints of Suicidal and Homicidal   Physical Exam  BP (!) 140/83 (BP Location: Left Arm)   Pulse 87   Temp 98.6 F (37 C) (Oral)   Resp 18   Ht '5\' 8"'$  (1.727 m)   Wt 68 kg   SpO2 96%   BMI 22.81 kg/m  Physical Exam General: NAD   ED Course / MDM  EKG:   I have reviewed the labs performed to date as well as medications administered while in observation.  Recent changes in the last 24 hours include none.  Plan  Current plan is for TOC.    [name], MD 01/19/22 [phone]

## 2022-01-20 DIAGNOSIS — F250 Schizoaffective disorder, bipolar type: Secondary | ICD-10-CM | POA: Diagnosis not present

## 2022-01-20 NOTE — ED Notes (Signed)
Pt sitting in chair in room  pt calm and cooperative

## 2022-01-20 NOTE — ED Notes (Signed)
VOL/Pending Placement 

## 2022-01-20 NOTE — ED Notes (Signed)
Pt refused dinner meal.

## 2022-01-20 NOTE — ED Notes (Signed)
Patient refused shower, He is calm and safe.

## 2022-01-20 NOTE — ED Notes (Signed)
Resumed care from wendy rn.  Pt in room   pt calm and cooperative.

## 2022-01-20 NOTE — ED Notes (Signed)
VOL/pending placement 

## 2022-01-20 NOTE — ED Notes (Signed)
Patient is irritable refused HS medications after multiple encouragement. Support provided.

## 2022-01-20 NOTE — ED Provider Notes (Signed)
Emergency Medicine Observation Re-evaluation Note  Ryan Zuniga is a 41 y.o. male, seen on rounds today.  Pt initially presented to the ED for complaints of Suicidal and Homicidal Currently, the patient is suicidal and homicidal.  Physical Exam  BP (!) 143/99   Pulse 80   Temp 97.7 F (36.5 C)   Resp 14   Ht '5\' 8"'$  (1.727 m)   Wt 68 kg   SpO2 98%   BMI 22.81 kg/m  Physical Exam General: No acute distress this morning Cardiac: Good peripheral perfusion Lungs: No respiratory distress   ED Course / MDM  EKG:   I have reviewed the labs performed to date as well as medications administered while in observation.  Recent changes in the last 24 hours include no acute changes.  Plan  Current plan is for awaiting placement.    Nathaniel Man, MD 01/20/22 [phone]

## 2022-01-21 DIAGNOSIS — F250 Schizoaffective disorder, bipolar type: Secondary | ICD-10-CM | POA: Diagnosis not present

## 2022-01-21 NOTE — ED Notes (Signed)
Pt given lunch tray.

## 2022-01-21 NOTE — ED Provider Notes (Signed)
Emergency Medicine Observation Re-evaluation Note  Burdette Forehand is a 41 y.o. male, seen on rounds today.  Pt initially presented to the ED for complaints of Suicidal and Homicidal Currently, the patient is sitting comfortably watching TV, denies any complaints.  Physical Exam  BP (!) 155/95 (BP Location: Left Arm)   Pulse 93   Temp 98.3 F (36.8 C) (Oral)   Resp 18   Ht '5\' 8"'$  (1.727 m)   Wt 68 kg   SpO2 98%   BMI 22.81 kg/m  Physical Exam Constitutional: Resting comfortably. Eyes: Conjunctivae are normal. Head: Atraumatic. Nose: No congestion/rhinnorhea. Mouth/Throat: Mucous membranes are moist. Neck: Normal ROM Cardiovascular: No cyanosis noted. Respiratory: Normal respiratory effort. Gastrointestinal: Non-distended. Genitourinary: deferred Musculoskeletal: No lower extremity tenderness nor edema. Neurologic:  Normal speech and language. No gross focal neurologic deficits are appreciated. Skin:  Skin is warm, dry and intact. No rash noted.   ED Course / MDM  EKG:   I have reviewed the labs performed to date as well as medications administered while in observation.  Recent changes in the last 24 hours include none.  Plan  Current plan is for dispo per social work.    Blake Divine, MD 01/21/22 [phone]

## 2022-01-21 NOTE — ED Notes (Signed)
Vol /pending placement 

## 2022-01-21 NOTE — ED Notes (Signed)
Report to include Situation, Background, Assessment, and Recommendations received from [facility]. Patient alert and oriented, warm and dry, in no acute distress. Patient denies SI, HI, AVH and pain. Patient made aware of Q15 minute rounds and security cameras for their safety. Patient instructed to come to me with needs or concerns.

## 2022-01-21 NOTE — ED Notes (Signed)
Pt refusing breakfast- pt irritable- will attempt meds and vitals later

## 2022-01-21 NOTE — ED Notes (Signed)
Snack and beverage given. 

## 2022-01-22 DIAGNOSIS — F250 Schizoaffective disorder, bipolar type: Secondary | ICD-10-CM | POA: Diagnosis not present

## 2022-01-22 NOTE — ED Provider Notes (Signed)
Emergency Medicine Observation Re-evaluation Note  Ryan Zuniga is a 41 y.o. male, seen on rounds today.  Pt initially presented to the ED for complaints of Suicidal and Homicidal  Currently, the patient is asleep- no issues per BHU nurse  Physical Exam  Blood pressure 136/81, pulse 74, temperature 98.1 F (36.7 C), temperature source Oral, resp. rate 18, height '5\' 8"'$  (1.727 m), weight 68 kg, SpO2 95 %.  Physical Exam General: No apparent distress Pulm: Normal WOB Psych: resting     ED Course / MDM     I have reviewed the labs performed to date as well as medications administered while in observation.  Recent changes in the last 24 hours include none   Plan   Current plan is to continue to wait for placement  Patient is not under full IVC at this time.   Vanessa Union, MD 01/22/22 [phone]

## 2022-01-22 NOTE — ED Notes (Signed)
Report to include Situation, Background, Assessment, and Recommendations received from [facility]. Patient alert and oriented, warm and dry, in no acute distress. Patient denies SI, HI, AVH and pain. Patient made aware of Q15 minute rounds and security cameras for their safety. Patient instructed to come to me with needs or concerns.

## 2022-01-22 NOTE — ED Notes (Signed)
Pt given ice cream at this time.

## 2022-01-22 NOTE — ED Notes (Signed)
This RN talked to Ryan Zuniga about his behavior and attitude. Pt agreed to take his meds and eat his lunch at this time.

## 2022-01-22 NOTE — ED Notes (Addendum)
Rn and tech went to obtain vitals and deliver breakfast and medications but pt started yelling at staff and refused to take medications and refused breakfast. Rn asked for pt to return two cups for his breakfast cup and he slammed doors in staff face. Pt left alone.

## 2022-01-22 NOTE — ED Notes (Signed)
Patient refused vital sign check

## 2022-01-22 NOTE — ED Notes (Signed)
VOL/pending placement 

## 2022-01-22 NOTE — ED Notes (Signed)
Pt given snack. 

## 2022-01-23 DIAGNOSIS — F250 Schizoaffective disorder, bipolar type: Secondary | ICD-10-CM | POA: Diagnosis not present

## 2022-01-23 NOTE — ED Notes (Signed)
Pt refusing breakfast and medications.

## 2022-01-23 NOTE — ED Notes (Signed)
Pt decided he wants to now take his meds. Meds administered.

## 2022-01-23 NOTE — ED Notes (Signed)
Pt still refusing meds and food.

## 2022-01-23 NOTE — ED Notes (Signed)
Snack and beverage given. 

## 2022-01-23 NOTE — ED Provider Notes (Addendum)
-----------------------------------------   1:56 PM on 01/23/2022 -----------------------------------------   Blood pressure 122/82, pulse 73, temperature 97.7 F (36.5 C), temperature source Oral, resp. rate 18, height '5\' 8"'$  (1.727 m), weight 68 kg, SpO2 96 %.  The patient is calm and cooperative at this time.  There have been no acute events since the last update.  Awaiting disposition plan from Behavioral Medicine and/or Social Work team(s).    Arta Silence, MD 01/23/22 1356    Arta Silence, MD 01/23/22 1357

## 2022-01-23 NOTE — ED Notes (Signed)
Pt is refusing VS at this time.

## 2022-01-23 NOTE — ED Notes (Signed)
Report to include Situation, Background, Assessment, and Recommendations received from [employer]. Patient alert and oriented, warm and dry, in no acute distress. Patient denies SI, HI, AVH and pain. Patient made aware of Q15 minute rounds and security cameras for their safety. Patient instructed to come to me with needs or concerns.

## 2022-01-23 NOTE — ED Notes (Signed)
Vol/Pending Placement

## 2022-01-23 NOTE — ED Notes (Signed)
VOL/pending placement 

## 2022-01-24 DIAGNOSIS — F250 Schizoaffective disorder, bipolar type: Secondary | ICD-10-CM | POA: Diagnosis not present

## 2022-01-24 NOTE — ED Notes (Signed)
Patient is vol pending placement 

## 2022-01-24 NOTE — ED Notes (Signed)
Unable to obtain vital signs due to pt refusal.

## 2022-01-24 NOTE — ED Notes (Signed)
Report received from RN including SBAR. Patient alert and oriented, warm and dry, and in no acute distress. Patient denies SI, HI, AVH and pain. Patient made aware of Q15 minute rounds and Engineer, drilling presence for their safety.   Patient instructed to come to this nurse with needs or concerns.Support and encouragement provided.

## 2022-01-24 NOTE — ED Notes (Signed)
Pt refused nighttime snack.

## 2022-01-24 NOTE — ED Provider Notes (Signed)
Emergency Medicine Observation Re-evaluation Note  Dawn Kiper is a 41 y.o. male, seen on rounds today.  Pt initially presented to the ED for complaints of Suicidal and Homicidal  Currently, the patient is resting- no issues per bhu nurse   Physical Exam  Blood pressure (!) 152/82, pulse 84, temperature 97.8 F (36.6 C), temperature source Oral, resp. rate 18, height '5\' 8"'$  (1.727 m), weight 68 kg, SpO2 94 %.  Physical Exam General: No apparent distress Pulm: Normal WOB Psych: resting     ED Course / MDM     I have reviewed the labs performed to date as well as medications administered while in observation.  Recent changes in the last 24 hours include none   Plan   Current plan is to continue to wait for placement  Patient is not under full IVC at this time.   Vanessa Benson, MD 01/24/22 [phone]

## 2022-01-24 NOTE — ED Notes (Signed)
Pt refusing breakfast and vitals.

## 2022-01-25 DIAGNOSIS — F250 Schizoaffective disorder, bipolar type: Secondary | ICD-10-CM | POA: Diagnosis not present

## 2022-01-25 NOTE — ED Provider Notes (Signed)
Emergency Medicine Observation Re-evaluation Note  Jassiah Viviano is a 41 y.o. male, seen on rounds today.  Pt initially presented to the ED for complaints of Suicidal and Homicidal  Patient reports no concerns overnight.  Currently ambulatory in the day room without distress.  Reports he is just using the bathroom but has no concerns to report  Physical Exam   Vitals:   01/23/22 2032 01/24/22 2131  BP: (!) 152/82 (!) 148/80  Pulse: 84 80  Resp: 18   Temp: 97.8 F (36.6 C)   SpO2: 94%      Physical Exam General: No apparent distress Pulm: Normal WOB Psych: Pleasant, ambulatory, says good morning     ED Course / MDM     I have reviewed the labs performed to date as well as medications administered while in observation.  Recent changes in the last 24 hours include none   Plan   Current plan is to continue to wait for placement  Patient is not under full IVC at this time.     Delman Kitten, MD 01/25/22 [phone]

## 2022-01-25 NOTE — ED Notes (Addendum)
PT up to restroom with steady gait. Stopped at nurses station and asked for a cup of water. Asked to bring a cup to the trash and reminded that we can not have a bunch of cups and trash sitting in rooms. Pt verbalized understanding and states he will have a good attitude today.

## 2022-01-25 NOTE — ED Notes (Signed)
Hospital meal provided.  100% consumed, pt tolerated w/o complaints.  Waste discarded appropriately.  Refused shower.

## 2022-01-25 NOTE — ED Notes (Signed)
Patient was given a sandwhich tray, a cup of ice cream, PB/crackers and a cup of soda and milk.

## 2022-01-25 NOTE — ED Notes (Signed)
Vol /pending placement 

## 2022-01-25 NOTE — ED Notes (Signed)
VOl/Pending Placement

## 2022-01-25 NOTE — ED Notes (Signed)
Hospital meal provided.  100% consumed, pt tolerated w/o complaints.  Waste discarded appropriately.   

## 2022-01-25 NOTE — ED Notes (Addendum)
Dr. Jacqualine Code at the bedside for pt evaluation.

## 2022-01-25 NOTE — ED Notes (Signed)
Snack and soda provided

## 2022-01-26 DIAGNOSIS — F250 Schizoaffective disorder, bipolar type: Secondary | ICD-10-CM | POA: Diagnosis not present

## 2022-01-26 NOTE — ED Notes (Signed)
Pt to nurses station and asked for a drink. Pt told to bring his trash and cups to the trash can and this nurse would get his drink. Pt was reminded by security to bring all his trash and pt walked away angrily and said he did not want the drink anymore. Pt then asked for the phone and told this nurse he wanted it to call his brother and have him "Gwenlyn Found" the Animal nutritionist who told him he needed to bring his trash to the can. Pt heard yelling loudly and banging on window in room.

## 2022-01-26 NOTE — ED Notes (Signed)
V/s received, Patient is calm and cooperative, took all medications, No signs of distress. Patient is safe, q 15 minute checks and camera surveillance in progress for safety.

## 2022-01-26 NOTE — ED Notes (Signed)
Patient ate 100% of supper and beverage, no signs of distress.  

## 2022-01-26 NOTE — ED Notes (Signed)
Report received from RN including SBAR. Patient alert and oriented, warm and dry, and in no acute distress. Patient denies SI, HI, AVH and pain. Patient made aware of Q15 minute rounds and Engineer, drilling presence for their safety. Patient instructed to come to this nurse with needs or concerns  Support and encouragement provided.Marland Kitchen

## 2022-01-26 NOTE — ED Notes (Signed)
VOL  PENDING  PLACEMENT 

## 2022-01-26 NOTE — ED Notes (Addendum)
Pt sitting up in room watching television; no distress noted.

## 2022-01-26 NOTE — ED Notes (Signed)
Lunch tray and beverage provided 

## 2022-01-26 NOTE — ED Notes (Addendum)
Patient has some cups in his room to throw away, but refuses at this time, nurse will go back and try again at lunch time, Patient is calm, no behavioral issues, just sitting in His room watching TV, nurse will continue to monitor. Patient refused His breakfast tray this morning, but had beverage.

## 2022-01-26 NOTE — ED Provider Notes (Signed)
Emergency Medicine Observation Re-evaluation Note  Ryan Zuniga is a 41 y.o. male, seen on rounds today.  Pt initially presented to the ED for complaints of Suicidal and Homicidal Currently, the patient is resting comfortably.  Physical Exam  BP 126/89 (BP Location: Right Arm)   Pulse 69   Temp 97.8 F (36.6 C) (Oral)   Resp 17   Ht '5\' 8"'$  (1.727 m)   Wt 68 kg   SpO2 96%   BMI 22.81 kg/m  Physical Exam General: No acute distress Cardiac: Well-perfused extremities Lungs: No respiratory distress Psych: Appropriate mood and affect  ED Course / MDM  EKG:   I have reviewed the labs performed to date as well as medications administered while in observation.  Recent changes in the last 24 hours include none.  Plan  Current plan is for placement.    Naaman Plummer, MD 01/26/22 [phone]

## 2022-01-26 NOTE — ED Notes (Signed)
Patient did let nurse throw away His trash, Patient is calm and cooperative, will continue to monitor, no behavioral issues noted.

## 2022-01-27 DIAGNOSIS — F250 Schizoaffective disorder, bipolar type: Secondary | ICD-10-CM | POA: Diagnosis not present

## 2022-01-27 NOTE — ED Notes (Signed)
Vol /pending placement 

## 2022-01-27 NOTE — ED Notes (Signed)
Pt refused vital signs.

## 2022-01-27 NOTE — ED Provider Notes (Signed)
Emergency Medicine Observation Re-evaluation Note  Maricela Kawahara is a 41 y.o. male, seen on rounds today.  Pt initially presented to the ED for complaints of Suicidal and Homicidal  Currently, the patient is calm, no acute complaints.  Physical Exam  Blood pressure 128/86, pulse 75, temperature 98.3 F (36.8 C), temperature source Oral, resp. rate 20, height '5\' 8"'$  (1.727 m), weight 68 kg, SpO2 94 %. Physical Exam General: NAD Lungs: CTAB Psych: not agitated  ED Course / MDM  EKG:    I have reviewed the labs performed to date as well as medications administered while in observation.  Recent changes in the last 24 hours include no acute events overnight.    Plan  Current plan is for TOC placement. Patient is not under full IVC at this time.   Carrie Mew, MD 01/27/22 0800

## 2022-01-27 NOTE — ED Notes (Signed)
Pt is refusing medications and to communicate with staff, will allow pt to have his personal space

## 2022-01-27 NOTE — ED Notes (Signed)
VOLUNTARY/continues to await placement

## 2022-01-27 NOTE — ED Notes (Signed)
Patient on the phone at this time

## 2022-01-27 NOTE — ED Notes (Signed)
Report received from Baxter Flattery, Conservation officer, nature. Patient alert and oriented, warm and dry, in no acute distress. Patient in poor mood and refuses to speak to staff. Patient made aware of Q15 minute rounds and security cameras for their safety. Patient instructed to come to this nurse with needs or concerns.

## 2022-01-27 NOTE — ED Notes (Signed)
Pt took another chair in the room and would not remove the chair. Pt also wanted to use phone and was advised that he could use the phone but needed to bring the chair out, pt refused. Security at bedside to remove chair without incident.

## 2022-01-27 NOTE — ED Notes (Signed)
Pt given snack. 

## 2022-01-27 NOTE — ED Notes (Signed)
Pt refused nighttime snack.

## 2022-01-27 NOTE — ED Notes (Signed)
Pt had three chairs in his room and put the chairs in front of door. Security removed two chairs. Pt became upset and yelling and swearing at staff.

## 2022-01-28 DIAGNOSIS — F250 Schizoaffective disorder, bipolar type: Secondary | ICD-10-CM | POA: Diagnosis not present

## 2022-01-28 MED ORDER — HYDROCHLOROTHIAZIDE 25 MG PO TABS: 25.0000 mg | ORAL_TABLET | Freq: Every day | ORAL | Status: DC

## 2022-01-28 MED ORDER — HYDROCHLOROTHIAZIDE 12.5 MG PO TABS
12.5000 mg | ORAL_TABLET | Freq: Every day | ORAL | Status: DC
  Administered 2022-01-29 – 2022-01-31 (×3): 12.5 mg via ORAL
  Filled 2022-01-28 (×4): qty 1

## 2022-01-28 NOTE — ED Notes (Signed)
Patient was able to calm self and agreed to take medications. Patient doors, mattress, and tv remote returned to patient. Chair was not returned to room at this time.

## 2022-01-28 NOTE — ED Notes (Signed)
Pt took shower and was provided new sheets and blankets for room. Pt putting sheets onto bed independently. Calm and cooperative at this time

## 2022-01-28 NOTE — ED Notes (Signed)
Vol/Pending Placement

## 2022-01-28 NOTE — ED Notes (Signed)
Pt did not want his dinner meal at this time

## 2022-01-28 NOTE — ED Notes (Signed)
Vol /pending placement 

## 2022-01-28 NOTE — ED Notes (Signed)
Hospital meal provided, pt tolerated w/o complaints.  Waste discarded appropriately.  

## 2022-01-28 NOTE — ED Notes (Addendum)
Patient became upset and cursing at Armed forces technical officer. Doors removed, chair removed, mattress removed and remote confiscated until patient can calm self.

## 2022-01-28 NOTE — ED Notes (Signed)
Dinner tray was offered to pt. And it was refused.

## 2022-01-28 NOTE — ED Provider Notes (Signed)
Emergency Medicine Observation Re-evaluation Note  Tannar Broker is a 41 y.o. male, seen on rounds today.  Pt initially presented to the ED for complaints of Suicidal and Homicidal  Currently, the patient is calm, no acute complaints.  Physical Exam  Blood pressure (!) 126/91, pulse 72, temperature 98.1 F (36.7 C), temperature source Oral, resp. rate 18, height '5\' 8"'$  (1.727 m), weight 68 kg, SpO2 96 %. Physical Exam General: NAD Lungs: CTAB Psych: not agitated  ED Course / MDM  EKG:    I have reviewed the labs performed to date as well as medications administered while in observation.  Recent changes in the last 24 hours include no acute events overnight.    Plan  Current plan is for SW placement. Patient is not under full IVC at this time.   Carrie Mew, MD 01/28/22 [phone]

## 2022-01-28 NOTE — ED Notes (Signed)
Patient refused vitals.

## 2022-01-29 DIAGNOSIS — F250 Schizoaffective disorder, bipolar type: Secondary | ICD-10-CM | POA: Diagnosis not present

## 2022-01-29 NOTE — ED Notes (Signed)
Report to include situation, background, assessment and recommendations from April RN. Patient sleeping, respirations regular and unlabored. Q15 minute rounds and security camera observation to continue.

## 2022-01-29 NOTE — ED Notes (Signed)
VOL to be placed 10/16?  Note on clipboard

## 2022-01-29 NOTE — ED Notes (Signed)
Pt finally decided to eat his lunch.

## 2022-01-29 NOTE — ED Notes (Signed)
Patient came out of room asking for chair.  Security advised patient he would have to wait and one would be brought to room.  Patient went into room and place mattress across door. Writer went into unit and pushed chair into room. Patient upset and told writer to just leave it. So writer just push within room and let patient move chair where he wanted it.  Patient still has mattress in door frame.

## 2022-01-29 NOTE — ED Notes (Signed)
Pt declined a snack and drink tonight.

## 2022-01-29 NOTE — ED Provider Notes (Signed)
Emergency Medicine Observation Re-evaluation Note  Ryan Zuniga is a 41 y.o. male, seen on rounds today.  Pt initially presented to the ED for complaints of Suicidal and Homicidal  Currently, the patient is sleeping- no bhu nurse issues   Physical Exam  Blood pressure (!) 140/89, pulse 80, temperature 98.1 F (36.7 C), temperature source Oral, resp. rate 17, height '5\' 8"'$  (1.727 m), weight 68 kg, SpO2 95 %.  Physical Exam General: No apparent distress Pulm: Normal WOB Psych: resting     ED Course / MDM     I have reviewed the labs performed to date as well as medications administered while in observation.  Recent changes in the last 24 hours include none   Plan   Current plan is to continue to wait for placement  Patient is not under full IVC at this time.   Vanessa St. Martin, MD 01/29/22 [phone]

## 2022-01-29 NOTE — ED Notes (Signed)
Report to hewan, rn

## 2022-01-30 DIAGNOSIS — F250 Schizoaffective disorder, bipolar type: Secondary | ICD-10-CM | POA: Diagnosis not present

## 2022-01-30 NOTE — ED Notes (Signed)
Lunch tray provided. 

## 2022-01-30 NOTE — ED Provider Notes (Signed)
Emergency Medicine Observation Re-evaluation Note  Ryan Zuniga is a 41 y.o. male, seen on rounds today.  Pt initially presented to the ED for complaints of Suicidal and Homicidal   Physical Exam  BP (!) 139/92   Pulse 84   Temp 98.1 F (36.7 C)   Resp 14   Ht '5\' 8"'$  (1.727 m)   Wt 68 kg   SpO2 96%   BMI 22.81 kg/m  Physical Exam General: no distress Lungs: no increased wob Psych: no agitation  ED Course / MDM  EKG:   I have reviewed the labs performed to date as well as medications administered while in observation.  Recent changes in the last 24 hours include none.  Plan  Current plan is for SW disposition. Rada Hay, MD 01/30/22 1626

## 2022-01-30 NOTE — ED Notes (Signed)
Pt decided to eat his dinner at this time.

## 2022-01-30 NOTE — ED Notes (Signed)
Offered [place] dinner tray in exchange for his two dirty trays in his room. Pt refused.

## 2022-01-30 NOTE — ED Notes (Signed)
VOL/pending possible placement on 10/16

## 2022-01-30 NOTE — ED Notes (Signed)
Hospital meal provided, pt tolerated w/o complaints.  Waste discarded appropriately.  

## 2022-01-30 NOTE — ED Notes (Signed)
Report to include Situation, Background, Assessment, and Recommendations received from [employer]. Patient alert and oriented, warm and dry, in no acute distress. Patient denies SI, HI, AVH and pain. Patient made aware of Q15 minute rounds and security cameras for their safety. Patient instructed to come to me with needs or concerns.

## 2022-01-31 DIAGNOSIS — F250 Schizoaffective disorder, bipolar type: Secondary | ICD-10-CM | POA: Diagnosis not present

## 2022-01-31 NOTE — ED Notes (Signed)
[facility] called and advised they would be here tomorrow before 2pm to pick up [facility]. Persons name will be Rennie Natter.

## 2022-01-31 NOTE — ED Notes (Signed)
Pt is refusing food, vitals and medications.

## 2022-01-31 NOTE — ED Notes (Addendum)
Tech collected two cups from patient room at request and they were filled with urine. This RN will be collecting all cups from patients room at this time. Three more cups collected.

## 2022-01-31 NOTE — ED Notes (Signed)
Snack and beverage given. 

## 2022-01-31 NOTE — ED Notes (Signed)
Report to include Situation, Background, Assessment, and Recommendations received from [employer]. Patient alert and oriented, warm and dry, in no acute distress. Patient denies SI, HI, AVH and pain. Patient made aware of Q15 minute rounds and security cameras for their safety. Patient instructed to come to me with needs or concerns.

## 2022-01-31 NOTE — ED Notes (Signed)
Patient is vol pending possible d/c in a.m

## 2022-01-31 NOTE — ED Notes (Signed)
Pt agreed to eat his dinner tray at this time.

## 2022-01-31 NOTE — ED Notes (Signed)
Pt was standing on his chair in room. Pt was asked to stop standing on his chair for safety multiple times. Pt refused to get off chair. Security, Environmental health practitioner removed chair from pt room.

## 2022-01-31 NOTE — ED Provider Notes (Signed)
Emergency Medicine Observation Re-evaluation Note  Ryan Zuniga is a 41 y.o. male, seen initially in the emergency department for psychiatric complaint.  No acute events since last update.  Physical Exam  BP (!) 151/91 (BP Location: Left Arm)   Pulse 81   Temp 97.7 F (36.5 C)   Resp 18   Ht '5\' 8"'$  (1.727 m)   Wt 68 kg   SpO2 94%   BMI 22.81 kg/m    ED Course / MDM   No recent lab work for review.  Plan  Current plan is for placement to an appropriate living facility once available.    Harvest Dark, MD 01/31/22 [phone]

## 2022-01-31 NOTE — ED Notes (Signed)
Pt came to door and asked to take his meds and get his vitals.

## 2022-02-01 ENCOUNTER — Telehealth: Payer: Self-pay | Admitting: Emergency Medicine

## 2022-02-01 MED ORDER — GNP MELATONIN 3 MG PO TABS: 3.0000 mg | ORAL_TABLET | Freq: Every day | ORAL | 0 refills | Status: DC

## 2022-02-01 MED ORDER — ARIPIPRAZOLE 10 MG PO TABS: 10.0000 mg | ORAL_TABLET | Freq: Every day | ORAL | 0 refills | Status: DC

## 2022-02-01 MED ORDER — OLANZAPINE 10 MG PO TBDP
10.0000 mg | ORAL_TABLET | Freq: Three times a day (TID) | ORAL | 0 refills | Status: DC | PRN
Start: 2022-02-01 — End: 2022-02-02

## 2022-02-01 MED ORDER — MIRTAZAPINE 15 MG PO TABS: 15.0000 mg | ORAL_TABLET | Freq: Every day | ORAL | 0 refills | Status: DC

## 2022-02-01 MED ORDER — BENZTROPINE MESYLATE 2 MG PO TABS
2.0000 mg | ORAL_TABLET | Freq: Two times a day (BID) | ORAL | 0 refills | Status: DC
Start: 2022-02-01 — End: 2022-02-02

## 2022-02-01 MED ORDER — DOXEPIN HCL 10 MG PO CAPS
20.0000 mg | ORAL_CAPSULE | Freq: Every day | ORAL | 0 refills | Status: DC
Start: 2022-02-01 — End: 2022-02-02

## 2022-02-01 MED ORDER — HYDROCHLOROTHIAZIDE 12.5 MG PO TABS: 12.5000 mg | ORAL_TABLET | Freq: Every day | ORAL | 0 refills | Status: DC

## 2022-02-01 MED ORDER — LAMOTRIGINE 25 MG PO TABS: 25.0000 mg | ORAL_TABLET | Freq: Two times a day (BID) | ORAL | 11 refills | Status: DC

## 2022-02-01 MED ORDER — FOLIC ACID 1 MG PO TABS: 1.0000 mg | ORAL_TABLET | Freq: Every day | ORAL | 0 refills | Status: DC

## 2022-02-01 NOTE — ED Notes (Signed)
Pt given lunch tray and drink at this time. 

## 2022-02-01 NOTE — ED Notes (Addendum)
Refused breakfast tray and medication

## 2022-02-01 NOTE — ED Notes (Signed)
VOL/Pending D/C today per last CSW note entered on 01/14/22

## 2022-02-01 NOTE — ED Notes (Signed)
RN on phone with mother for update

## 2022-02-01 NOTE — ED Notes (Signed)
Two cargivers here to take pt to group home. Discharge instructions, MAR and pt belongings provided as requested. Awaiting dad to come to ED with pt belongings for service to take with them and legal guardian who is scheduled to meet at 2pm.  Belongings include clothes, phone, charge and money that was in safe.

## 2022-02-01 NOTE — ED Notes (Signed)
Pt declined breakfast tray and vitals. Pt said "No, I'm good"

## 2022-02-01 NOTE — ED Provider Notes (Addendum)
Emergency Medicine Observation Re-evaluation Note  Physical Exam   BP 130/86 (BP Location: Right Arm)   Pulse 73   Temp 98.1 F (36.7 C) (Oral)   Resp 18   Ht '5\' 8"'$  (1.727 m)   Wt 68 kg   SpO2 94%   BMI 22.81 kg/m   Pt is calm and resting, respirations unlabored, and appears comfortable.   ED Course / MDM   No reported events during my shift at the time of this note.   Pt is awaiting dispo from SW    11:49 AM Transport here to take patient to facility. I reviewed patient's vital signs, appropriate for transfer at this time, as well review of notes from this hospitalization and treatment plan dated 01/14/2022 from [facility].  Lucillie Garfinkel MD    Lucillie Garfinkel, MD 02/01/22 [id number]    Lucillie Garfinkel, MD 02/01/22 1150

## 2022-02-01 NOTE — Telephone Encounter (Signed)
Prescriptions sent

## 2022-02-02 ENCOUNTER — Telehealth: Payer: Self-pay | Admitting: Emergency Medicine

## 2022-02-02 MED ORDER — LAMOTRIGINE 25 MG PO TABS: 25.0000 mg | ORAL_TABLET | Freq: Two times a day (BID) | ORAL | 11 refills | Status: AC

## 2022-02-02 MED ORDER — HYDROCHLOROTHIAZIDE 12.5 MG PO TABS: 12.5000 mg | ORAL_TABLET | Freq: Every day | ORAL | 0 refills | Status: AC

## 2022-02-02 MED ORDER — OLANZAPINE 10 MG PO TBDP: 10.0000 mg | ORAL_TABLET | Freq: Three times a day (TID) | ORAL | 0 refills | Status: AC | PRN

## 2022-02-02 MED ORDER — DOXEPIN HCL 10 MG PO CAPS: 20.0000 mg | ORAL_CAPSULE | Freq: Every day | ORAL | 0 refills | Status: AC

## 2022-02-02 MED ORDER — FOLIC ACID 1 MG PO TABS: 1.0000 mg | ORAL_TABLET | Freq: Every day | ORAL | 0 refills | Status: AC

## 2022-02-02 MED ORDER — ARIPIPRAZOLE 10 MG PO TABS: 10.0000 mg | ORAL_TABLET | Freq: Every day | ORAL | 0 refills | Status: AC

## 2022-02-02 MED ORDER — MIRTAZAPINE 15 MG PO TABS: 15.0000 mg | ORAL_TABLET | Freq: Every day | ORAL | 0 refills | Status: AC

## 2022-02-02 MED ORDER — BENZTROPINE MESYLATE 2 MG PO TABS: 2.0000 mg | ORAL_TABLET | Freq: Two times a day (BID) | ORAL | 0 refills | Status: AC

## 2022-02-02 MED ORDER — GNP MELATONIN 3 MG PO TABS: 3.0000 mg | ORAL_TABLET | Freq: Every day | ORAL | 0 refills | Status: AC

## 2022-02-02 NOTE — ED Notes (Signed)
Caregivers from group home called this RN  at approximately 3:12pm on 10/16 after discharge explaining pt cannot see PCP for a few days and they need paper prescriptions. Caregiver provided this RN with pharmacy name, number and fax number. MD Dr. Jacelyn Grip made aware of urgency for prescription medications.

## 2022-02-02 NOTE — ED Notes (Signed)
Two caregivers from group home stating they need to get on the road for the long drive and cannot wait any longer. Caregivers stating will coordinate with pt's dad to pick up belongings he was in route to bring.

## 2022-02-02 NOTE — Telephone Encounter (Signed)
Group home called - discharge rx sent to wrong pharmacy. Will resubmit to [facility], [address], [place]

## 2022-03-10 ENCOUNTER — Ambulatory Visit: Payer: Self-pay | Admitting: Nurse Practitioner

## 2023-03-22 IMAGING — CT CT MAXILLOFACIAL W/O CM
3 of 8 series · 15 of 47 positions shown, 18 images · non-contrast
Comparison: No priors.

CLINICAL DATA: 40-year-old male with history of blunt facial
trauma.

EXAM:
CT HEAD WITHOUT CONTRAST
CT MAXILLOFACIAL WITHOUT CONTRAST
TECHNIQUE: Multidetector CT imaging of the head and maxillofacial structures
were performed using the standard protocol without intravenous
contrast. Multiplanar CT image reconstructions of the maxillofacial
structures were also generated.
RADIATION DOSE REDUCTION: This exam was performed according to the
departmental dose-optimization program which includes automated
exposure control, adjustment of the mA and/or kV according to
patient size and/or use of iterative reconstruction technique.

[Series 3: st thins · axial · 0.43mm/px · z∈[-169,+4]mm · 9 of 311 slices shown, 12 images]
[im 32/311  brain]
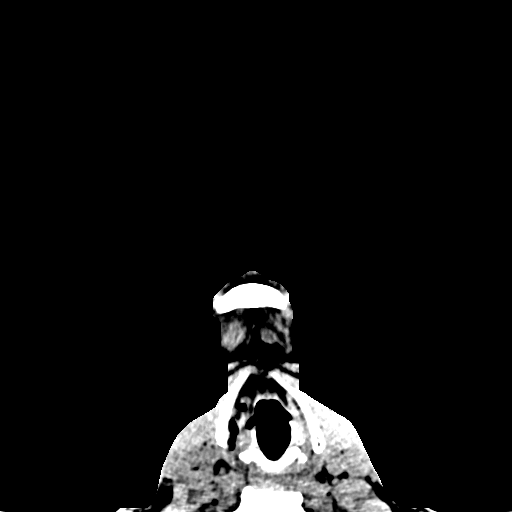
[im 32/311  bone]
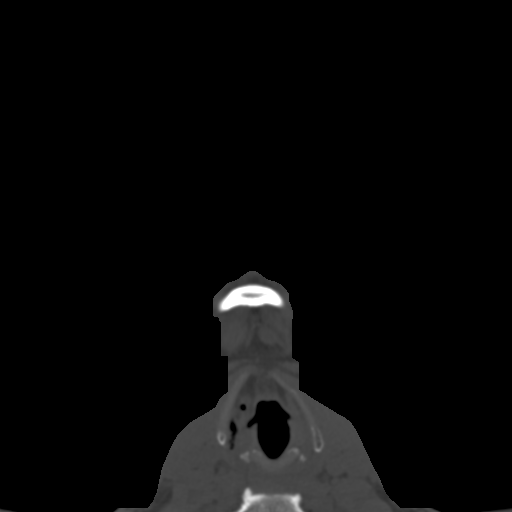
[im 63/311  bone]
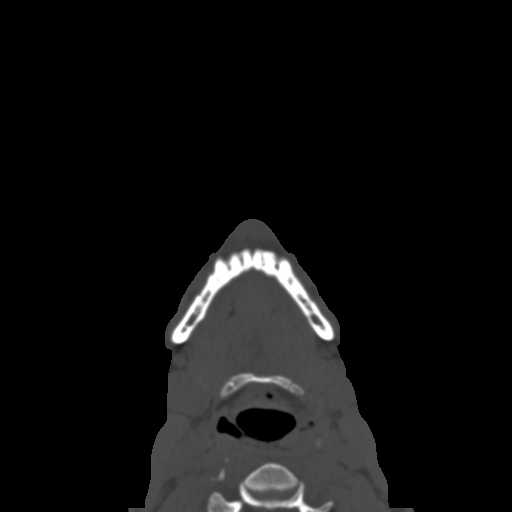
[im 94/311  bone]
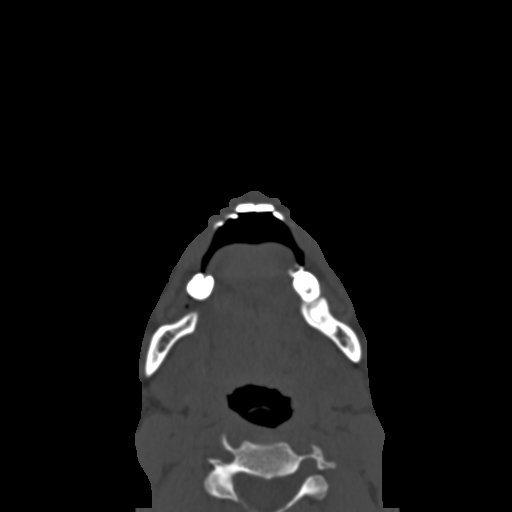
[im 125/311  bone]
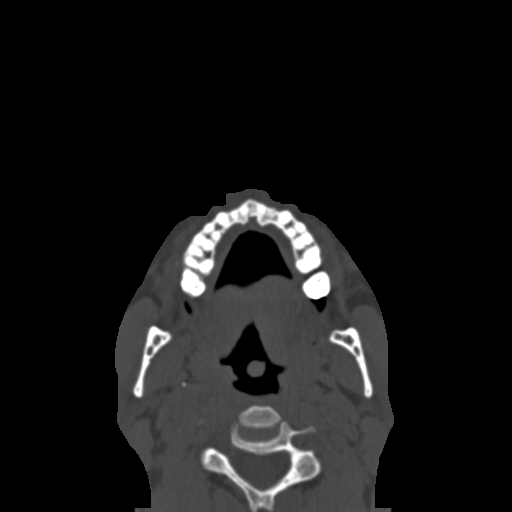
[im 156/311  brain]
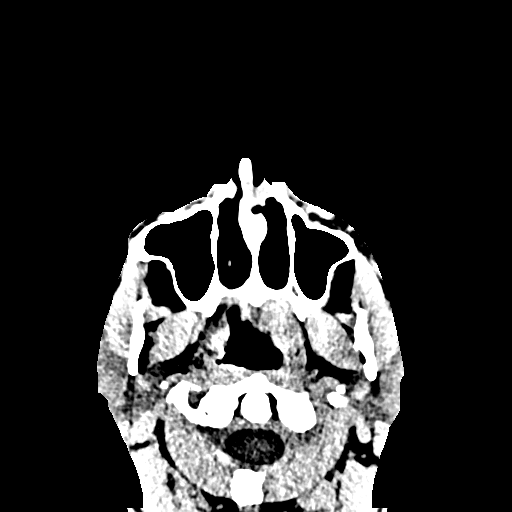
[im 156/311  bone]
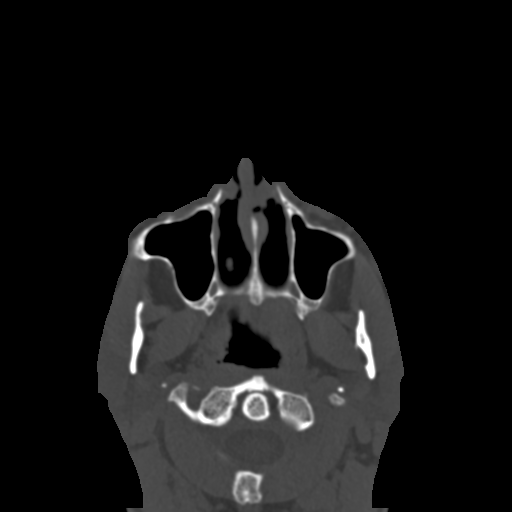
[im 187/311  bone]
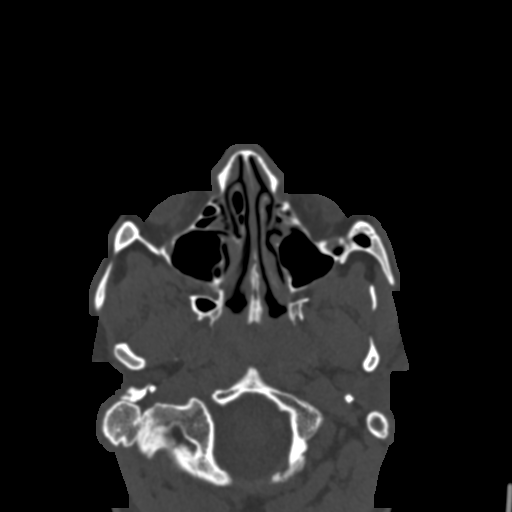
[im 218/311  bone]
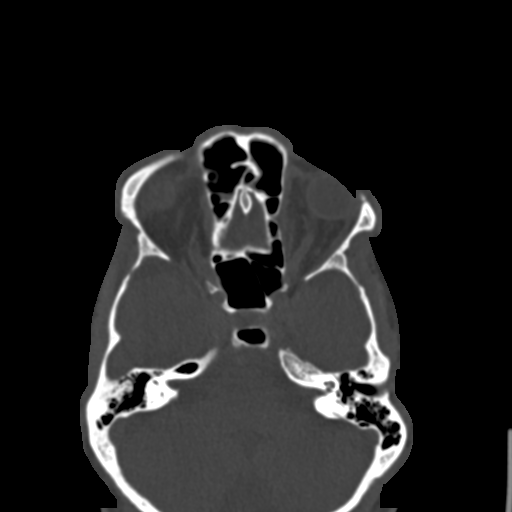
[im 249/311  bone]
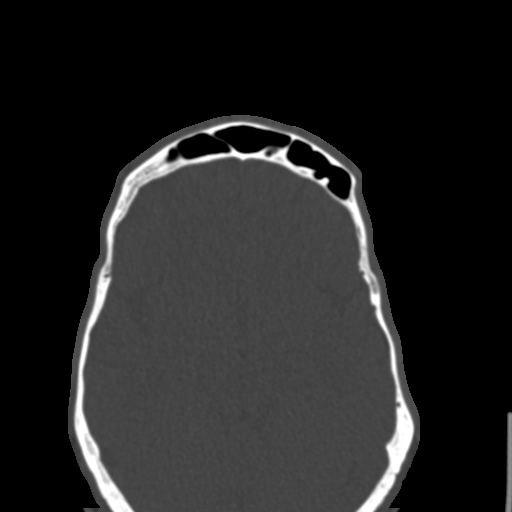
[im 280/311  brain]
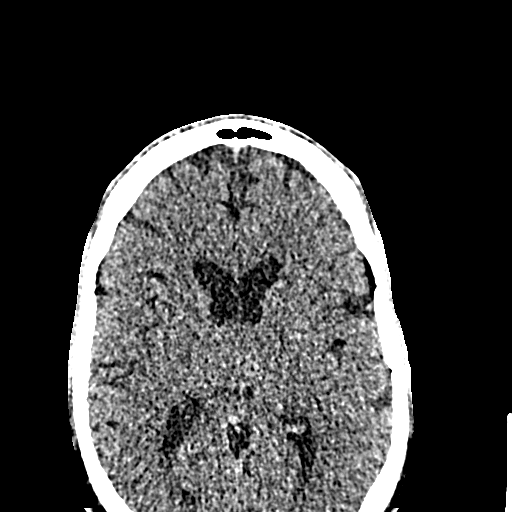
[im 280/311  bone]
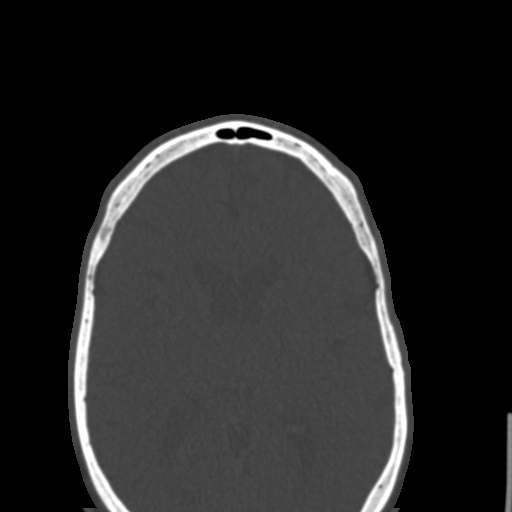

[Series 8: bone cor · coronal · 0.38mm/px · 3 of 123 slices shown]
[im 31/123  bone]
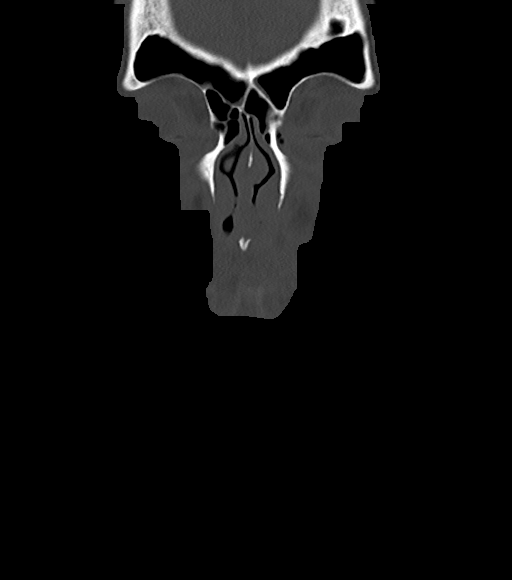
[im 62/123  bone]
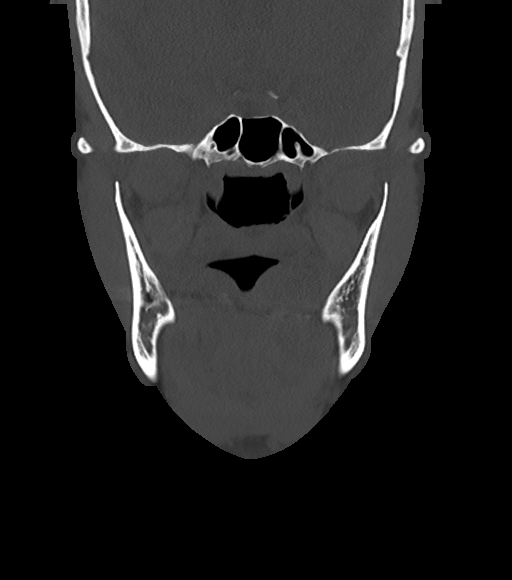
[im 92/123  bone]
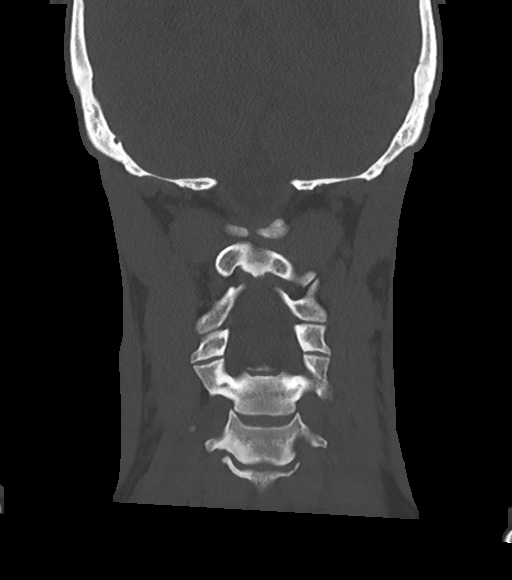

[Series 9: bone sag · sagittal · 0.44mm/px · 3 of 99 slices shown]
[im 30/99  bone]
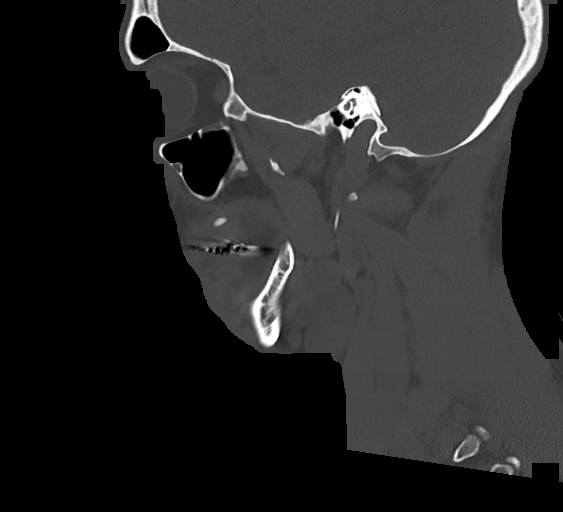
[im 53/99  bone]
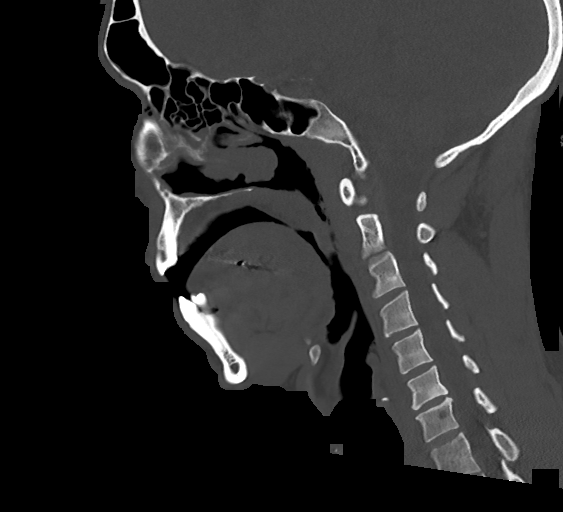
[im 76/99  bone]
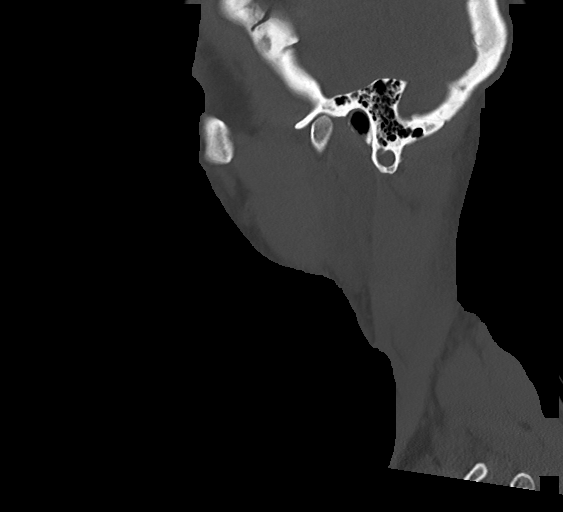

[15 of 47 positions shown; findings below may reference images not displayed]

FINDINGS: CT HEAD FINDINGS

Brain: No evidence of acute infarction, hemorrhage, hydrocephalus,
extra-axial collection or mass lesion/mass effect.

Vascular: No hyperdense vessel or unexpected calcification.

Skull: Normal. Negative for fracture or focal lesion.

Other: None.

CT MAXILLOFACIAL FINDINGS

Osseous: No fracture or mandibular dislocation. No destructive
process.

Orbits: Negative. No traumatic or inflammatory finding.

Sinuses: Clear.

Soft tissues: Negative.
IMPRESSION: 1. No evidence of significant acute traumatic injury to the skull,
facial bones or brain.
2. The appearance of the brain is normal.

## 2023-03-22 IMAGING — CT CT HEAD W/O CM
4 series · 16 of 47 positions shown, 18 images · non-contrast
Comparison: No priors.

CLINICAL DATA: 40-year-old male with history of blunt facial
trauma.

EXAM:
CT HEAD WITHOUT CONTRAST
CT MAXILLOFACIAL WITHOUT CONTRAST
TECHNIQUE: Multidetector CT imaging of the head and maxillofacial structures
were performed using the standard protocol without intravenous
contrast. Multiplanar CT image reconstructions of the maxillofacial
structures were also generated.
RADIATION DOSE REDUCTION: This exam was performed according to the
departmental dose-optimization program which includes automated
exposure control, adjustment of the mA and/or kV according to
patient size and/or use of iterative reconstruction technique.

[Series 2: head wo · axial · 0.47mm/px · z∈[-57,+63]mm · 7 of 33 slices shown, 9 images]
[im 5/33  brain]
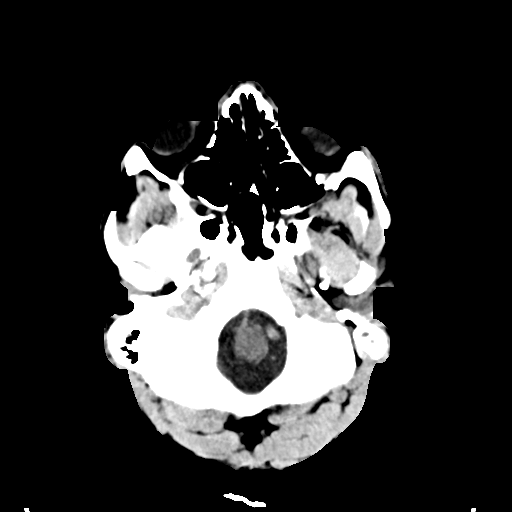
[im 5/33  bone]
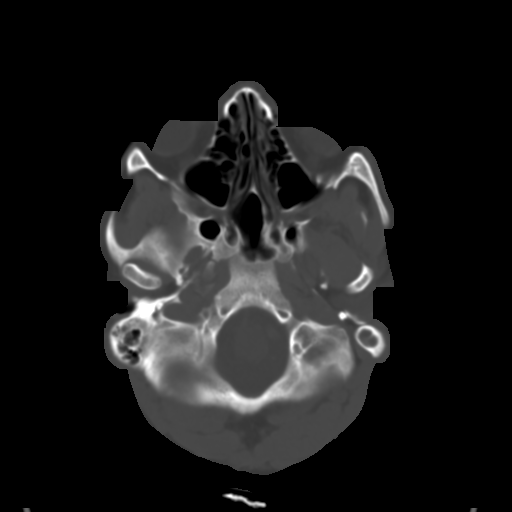
[im 9/33  brain]
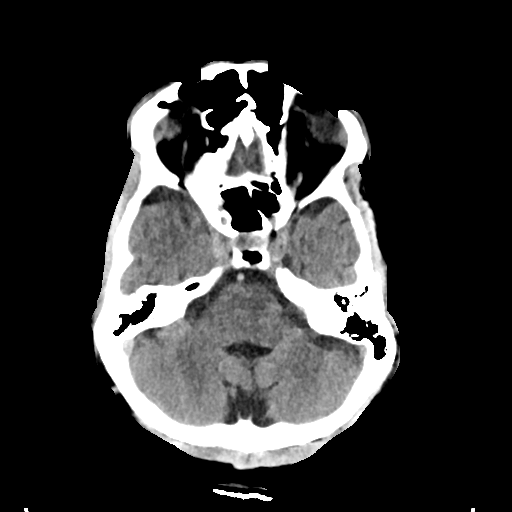
[im 13/33  brain]
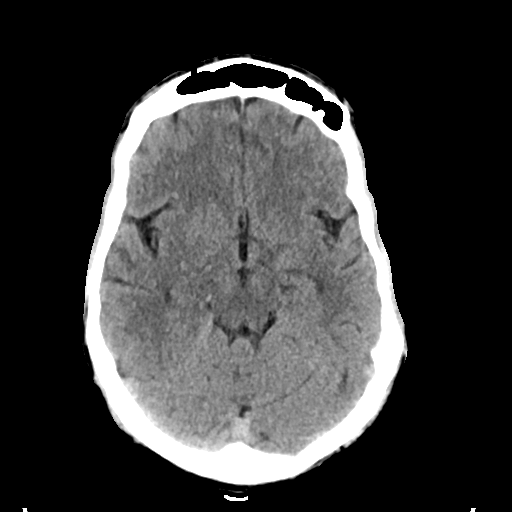
[im 17/33  brain]
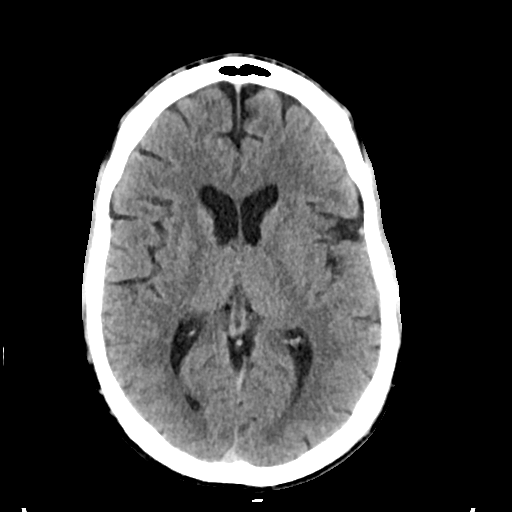
[im 21/33  brain]
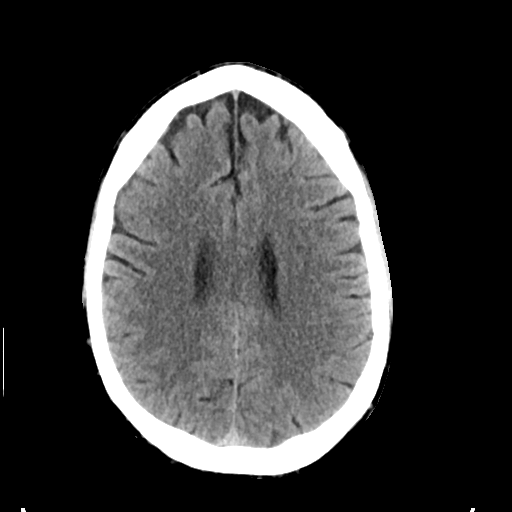
[im 21/33  bone]
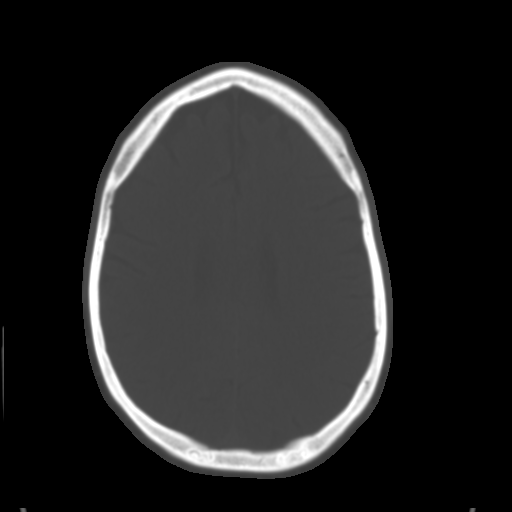
[im 25/33  brain]
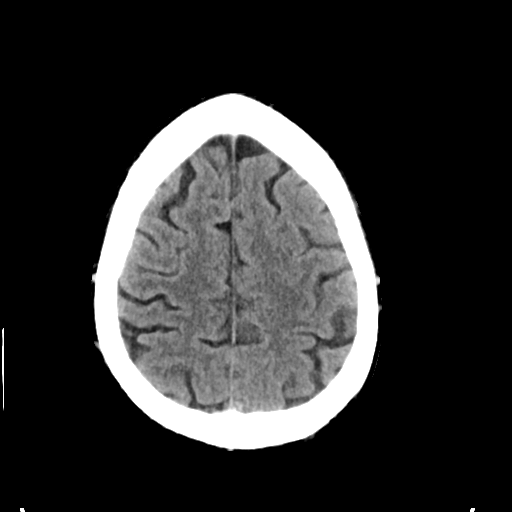
[im 29/33  brain]
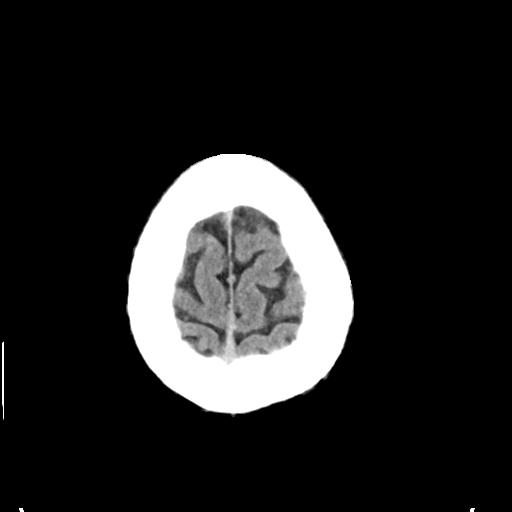

[Series 3: head bone · axial · 0.47mm/px · z∈[-61,-29]mm · 3 of 83 slices shown]
[im 9/83  bone]
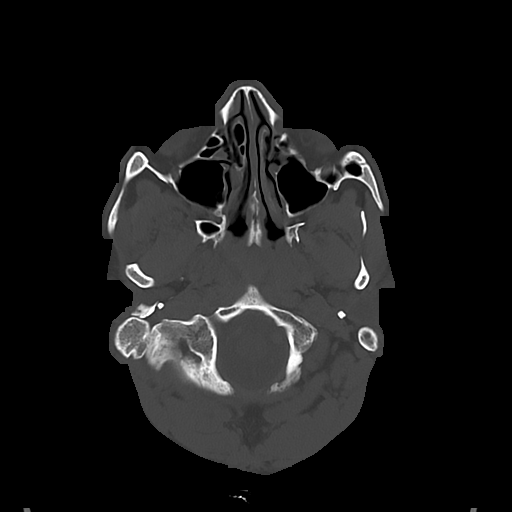
[im 17/83  bone]
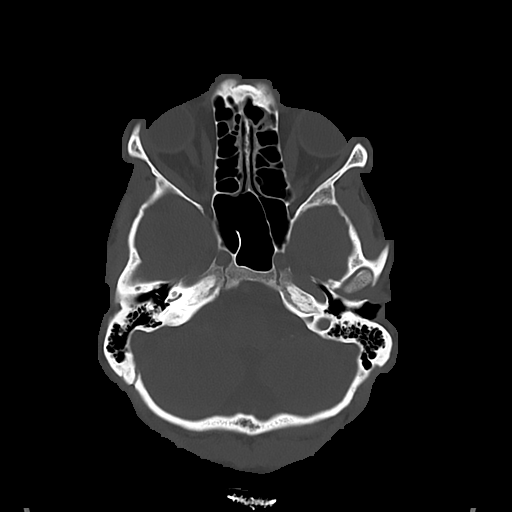
[im 25/83  bone]
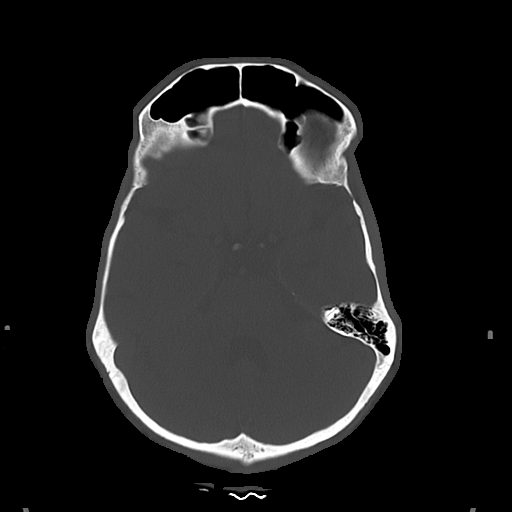

[Series 4: cor soft · coronal · 0.36mm/px · 3 of 71 slices shown]
[im 24/71  brain]
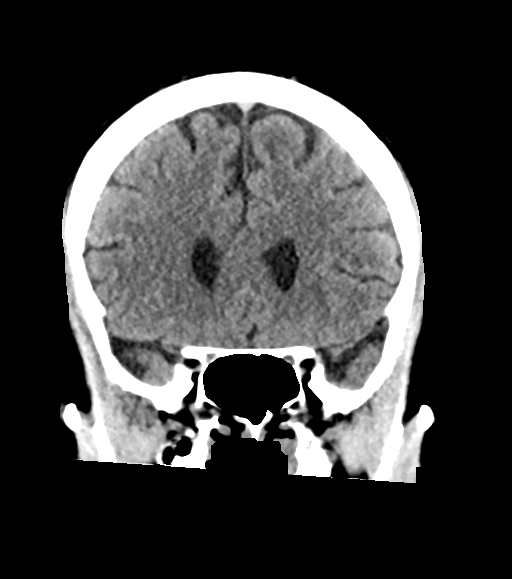
[im 32/71  brain]
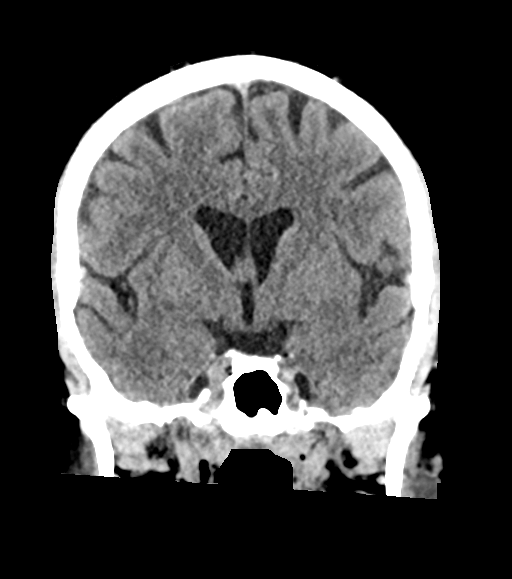
[im 39/71  brain]
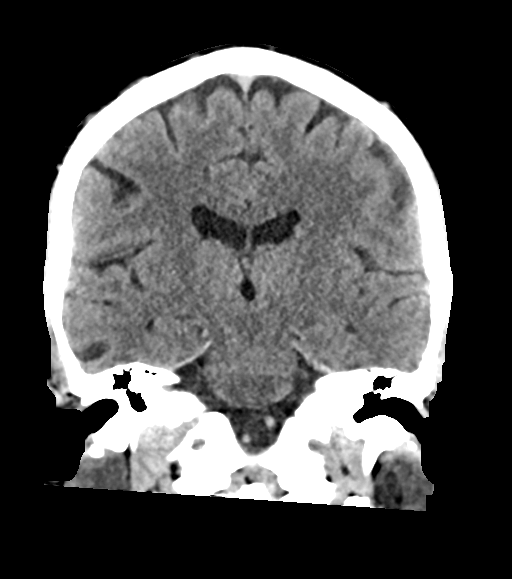

[Series 5: sag soft · sagittal · 0.40mm/px · 3 of 60 slices shown]
[im 20/60  brain]
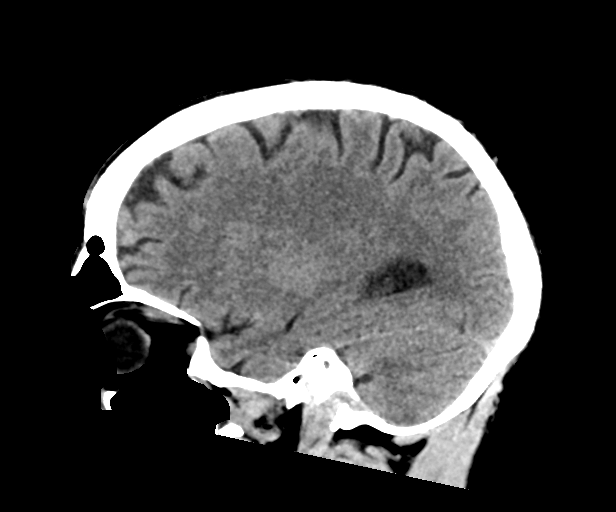
[im 30/60  brain]
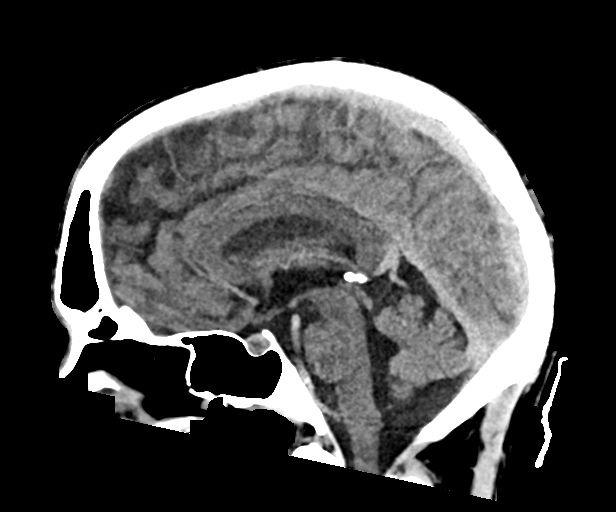
[im 40/60  brain]
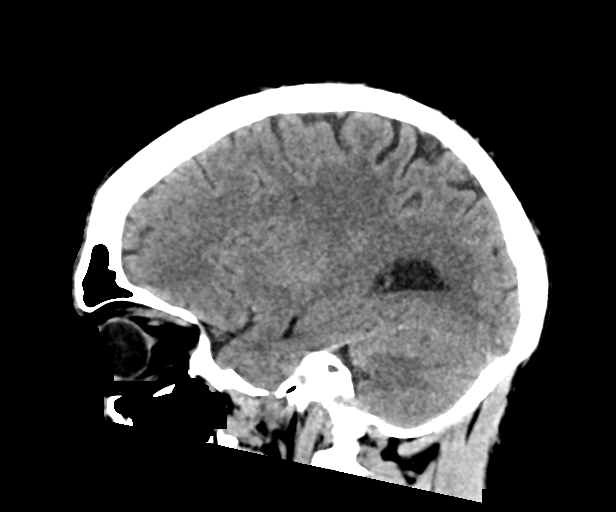

[16 of 47 positions shown; findings below may reference images not displayed]

FINDINGS: CT HEAD FINDINGS

Brain: No evidence of acute infarction, hemorrhage, hydrocephalus,
extra-axial collection or mass lesion/mass effect.

Vascular: No hyperdense vessel or unexpected calcification.

Skull: Normal. Negative for fracture or focal lesion.

Other: None.

CT MAXILLOFACIAL FINDINGS

Osseous: No fracture or mandibular dislocation. No destructive
process.

Orbits: Negative. No traumatic or inflammatory finding.

Sinuses: Clear.

Soft tissues: Negative.
IMPRESSION: 1. No evidence of significant acute traumatic injury to the skull,
facial bones or brain.
2. The appearance of the brain is normal.
